# Patient Record
Sex: Male | Born: 1935 | Race: White | Hispanic: No | Marital: Married | Smoking: Never smoker
Health system: Southern US, Community
[De-identification: ages and names within clinical notes are randomized; demographics above are authoritative.]

## PROBLEM LIST (undated history)

## (undated) DIAGNOSIS — K219 Gastro-esophageal reflux disease without esophagitis: Secondary | ICD-10-CM

## (undated) DIAGNOSIS — M1990 Unspecified osteoarthritis, unspecified site: Secondary | ICD-10-CM

## (undated) DIAGNOSIS — L408 Other psoriasis: Secondary | ICD-10-CM

## (undated) DIAGNOSIS — D649 Anemia, unspecified: Secondary | ICD-10-CM

## (undated) DIAGNOSIS — M797 Fibromyalgia: Secondary | ICD-10-CM

## (undated) DIAGNOSIS — E785 Hyperlipidemia, unspecified: Secondary | ICD-10-CM

## (undated) DIAGNOSIS — C4490 Unspecified malignant neoplasm of skin, unspecified: Secondary | ICD-10-CM

## (undated) DIAGNOSIS — C8590 Non-Hodgkin lymphoma, unspecified, unspecified site: Secondary | ICD-10-CM

## (undated) DIAGNOSIS — I509 Heart failure, unspecified: Secondary | ICD-10-CM

## (undated) DIAGNOSIS — H9190 Unspecified hearing loss, unspecified ear: Secondary | ICD-10-CM

## (undated) DIAGNOSIS — I2510 Atherosclerotic heart disease of native coronary artery without angina pectoris: Secondary | ICD-10-CM

## (undated) DIAGNOSIS — I10 Essential (primary) hypertension: Secondary | ICD-10-CM

## (undated) DIAGNOSIS — Z87448 Personal history of other diseases of urinary system: Secondary | ICD-10-CM

## (undated) HISTORY — PX: APPENDECTOMY: SHX54

## (undated) HISTORY — PX: CYST EXCISION: SHX5701

## (undated) HISTORY — PX: EYE SURGERY: SHX253

## (undated) HISTORY — PX: POLYPECTOMY: SHX149

## (undated) HISTORY — PX: ELECTROLYSIS OF MISDIRECTED LASHES: SHX6483

## (undated) HISTORY — DX: Personal history of other diseases of urinary system: Z87.448

## (undated) HISTORY — DX: Other psoriasis: L40.8

## (undated) HISTORY — DX: Essential (primary) hypertension: I10

## (undated) HISTORY — PX: DIAGNOSTIC LAPAROSCOPY: SUR761

## (undated) HISTORY — PX: TEAR DUCT PROBING: SHX793

## (undated) HISTORY — PX: TYMPANOPLASTY: SHX33

## (undated) HISTORY — PX: VASECTOMY: SHX75

## (undated) HISTORY — DX: Unspecified malignant neoplasm of skin, unspecified: C44.90

## (undated) HISTORY — DX: Unspecified osteoarthritis, unspecified site: M19.90

## (undated) HISTORY — DX: Atherosclerotic heart disease of native coronary artery without angina pectoris: I25.10

## (undated) HISTORY — DX: Unspecified hearing loss, unspecified ear: H91.90

## (undated) HISTORY — PX: COLONOSCOPY: SHX174

## (undated) HISTORY — DX: Hyperlipidemia, unspecified: E78.5

---

## 2004-09-19 ENCOUNTER — Ambulatory Visit: Payer: Self-pay | Admitting: Internal Medicine

## 2004-09-27 ENCOUNTER — Ambulatory Visit: Payer: Self-pay | Admitting: Internal Medicine

## 2004-11-16 ENCOUNTER — Ambulatory Visit: Payer: Self-pay | Admitting: Internal Medicine

## 2005-01-26 HISTORY — PX: MASS EXCISION: SHX2000

## 2005-02-01 ENCOUNTER — Encounter: Admission: RE | Admit: 2005-02-01 | Discharge: 2005-02-01 | Payer: Self-pay | Admitting: General Surgery

## 2005-02-05 ENCOUNTER — Encounter (INDEPENDENT_AMBULATORY_CARE_PROVIDER_SITE_OTHER): Payer: Self-pay | Admitting: *Deleted

## 2005-02-05 ENCOUNTER — Ambulatory Visit (HOSPITAL_COMMUNITY): Admission: RE | Admit: 2005-02-05 | Discharge: 2005-02-05 | Payer: Self-pay | Admitting: General Surgery

## 2005-02-05 ENCOUNTER — Ambulatory Visit (HOSPITAL_BASED_OUTPATIENT_CLINIC_OR_DEPARTMENT_OTHER): Admission: RE | Admit: 2005-02-05 | Discharge: 2005-02-05 | Payer: Self-pay | Admitting: General Surgery

## 2005-03-15 ENCOUNTER — Ambulatory Visit: Payer: Self-pay | Admitting: Internal Medicine

## 2005-07-26 ENCOUNTER — Ambulatory Visit: Payer: Self-pay | Admitting: Internal Medicine

## 2005-08-13 ENCOUNTER — Ambulatory Visit: Payer: Self-pay | Admitting: Internal Medicine

## 2005-09-20 ENCOUNTER — Ambulatory Visit: Payer: Self-pay | Admitting: Internal Medicine

## 2005-10-01 ENCOUNTER — Ambulatory Visit: Payer: Self-pay | Admitting: Internal Medicine

## 2005-11-18 ENCOUNTER — Ambulatory Visit: Payer: Self-pay | Admitting: Internal Medicine

## 2006-02-07 ENCOUNTER — Ambulatory Visit: Payer: Self-pay | Admitting: Internal Medicine

## 2006-03-14 ENCOUNTER — Ambulatory Visit: Payer: Self-pay | Admitting: Internal Medicine

## 2006-04-12 ENCOUNTER — Ambulatory Visit: Payer: Self-pay | Admitting: Family Medicine

## 2006-08-08 ENCOUNTER — Ambulatory Visit: Payer: Self-pay | Admitting: Internal Medicine

## 2006-10-06 ENCOUNTER — Ambulatory Visit: Payer: Self-pay | Admitting: Internal Medicine

## 2006-10-30 ENCOUNTER — Ambulatory Visit: Payer: Self-pay | Admitting: Internal Medicine

## 2006-12-03 ENCOUNTER — Ambulatory Visit: Payer: Self-pay | Admitting: Internal Medicine

## 2006-12-03 LAB — CONVERTED CEMR LAB
AST: 29 units/L (ref 0–37)
Albumin: 3.5 g/dL (ref 3.5–5.2)
Total Bilirubin: 0.9 mg/dL (ref 0.3–1.2)

## 2006-12-08 ENCOUNTER — Ambulatory Visit: Payer: Self-pay | Admitting: Internal Medicine

## 2006-12-17 ENCOUNTER — Ambulatory Visit: Payer: Self-pay

## 2006-12-17 ENCOUNTER — Encounter: Payer: Self-pay | Admitting: Cardiology

## 2007-03-02 ENCOUNTER — Ambulatory Visit: Payer: Self-pay | Admitting: Gastroenterology

## 2007-03-10 ENCOUNTER — Encounter: Payer: Self-pay | Admitting: Gastroenterology

## 2007-03-10 ENCOUNTER — Ambulatory Visit: Payer: Self-pay | Admitting: Gastroenterology

## 2007-07-13 ENCOUNTER — Ambulatory Visit: Payer: Self-pay | Admitting: Internal Medicine

## 2007-08-01 ENCOUNTER — Ambulatory Visit: Payer: Self-pay | Admitting: Internal Medicine

## 2007-10-13 ENCOUNTER — Encounter: Payer: Self-pay | Admitting: *Deleted

## 2007-10-13 DIAGNOSIS — Z87448 Personal history of other diseases of urinary system: Secondary | ICD-10-CM

## 2007-10-13 DIAGNOSIS — C4490 Unspecified malignant neoplasm of skin, unspecified: Secondary | ICD-10-CM

## 2007-10-13 DIAGNOSIS — L408 Other psoriasis: Secondary | ICD-10-CM

## 2007-10-13 DIAGNOSIS — M19079 Primary osteoarthritis, unspecified ankle and foot: Secondary | ICD-10-CM | POA: Insufficient documentation

## 2007-10-13 DIAGNOSIS — I10 Essential (primary) hypertension: Secondary | ICD-10-CM

## 2007-10-13 DIAGNOSIS — Z9089 Acquired absence of other organs: Secondary | ICD-10-CM

## 2007-10-13 DIAGNOSIS — E785 Hyperlipidemia, unspecified: Secondary | ICD-10-CM

## 2007-10-13 DIAGNOSIS — Z8679 Personal history of other diseases of the circulatory system: Secondary | ICD-10-CM | POA: Insufficient documentation

## 2007-10-13 DIAGNOSIS — I493 Ventricular premature depolarization: Secondary | ICD-10-CM | POA: Insufficient documentation

## 2007-10-13 DIAGNOSIS — M109 Gout, unspecified: Secondary | ICD-10-CM

## 2007-10-13 HISTORY — DX: Other psoriasis: L40.8

## 2007-10-13 HISTORY — DX: Hyperlipidemia, unspecified: E78.5

## 2007-10-13 HISTORY — DX: Unspecified malignant neoplasm of skin, unspecified: C44.90

## 2007-10-13 HISTORY — DX: Personal history of other diseases of urinary system: Z87.448

## 2007-10-13 HISTORY — DX: Essential (primary) hypertension: I10

## 2007-10-14 ENCOUNTER — Ambulatory Visit: Payer: Self-pay | Admitting: Internal Medicine

## 2007-10-14 LAB — CONVERTED CEMR LAB
BUN: 16 mg/dL (ref 6–23)
Bilirubin, Direct: 0.2 mg/dL (ref 0.0–0.3)
Calcium: 9.1 mg/dL (ref 8.4–10.5)
Chloride: 105 meq/L (ref 96–112)
Cholesterol: 196 mg/dL (ref 0–200)
GFR calc non Af Amer: 63 mL/min
Glucose, Bld: 93 mg/dL (ref 70–99)
HDL: 29.7 mg/dL — ABNORMAL LOW (ref >39.0)
LDL Cholesterol: 135 mg/dL — ABNORMAL HIGH (ref 0–99)
Potassium: 4.2 meq/L (ref 3.5–5.1)
Sodium: 141 meq/L (ref 135–145)
Total Bilirubin: 0.9 mg/dL (ref 0.3–1.2)
Total Protein: 6.8 g/dL (ref 6.0–8.3)
Triglycerides: 157 mg/dL — ABNORMAL HIGH (ref 0–149)

## 2007-10-15 ENCOUNTER — Encounter: Payer: Self-pay | Admitting: Internal Medicine

## 2007-11-16 ENCOUNTER — Ambulatory Visit: Payer: Self-pay | Admitting: Internal Medicine

## 2007-11-16 LAB — CONVERTED CEMR LAB
BUN: 18 mg/dL
Basophils Absolute: 0 K/uL
Basophils Relative: 0.6 %
CO2: 28 meq/L
Calcium: 9.3 mg/dL
Chloride: 100 meq/L
Cholesterol: 213 mg/dL
Creatinine, Ser: 1.2 mg/dL
Direct LDL: 128 mg/dL
Eosinophils Absolute: 0.2 K/uL
Eosinophils Relative: 2.7 %
GFR calc Af Amer: 77 mL/min
GFR calc non Af Amer: 63 mL/min
Glucose, Bld: 95 mg/dL
HCT: 50.2 %
HDL: 28.8 mg/dL — ABNORMAL LOW
Hemoglobin: 17.7 g/dL — ABNORMAL HIGH
Lymphocytes Relative: 40.2 %
MCHC: 35.3 g/dL
MCV: 97.4 fL
Monocytes Absolute: 0.5 K/uL
Monocytes Relative: 7.7 %
Neutro Abs: 3.1 K/uL
Neutrophils Relative %: 48.8 %
Platelets: 208 K/uL
Potassium: 4.1 meq/L
RBC: 5.15 M/uL
RDW: 12.8 %
Sodium: 136 meq/L
Total CHOL/HDL Ratio: 7.4
Triglycerides: 161 mg/dL — ABNORMAL HIGH
VLDL: 32 mg/dL
WBC: 6.3 10*3/microliter

## 2007-11-17 ENCOUNTER — Ambulatory Visit: Payer: Self-pay | Admitting: Cardiology

## 2007-11-17 ENCOUNTER — Inpatient Hospital Stay (HOSPITAL_BASED_OUTPATIENT_CLINIC_OR_DEPARTMENT_OTHER): Admission: RE | Admit: 2007-11-17 | Discharge: 2007-11-17 | Payer: Self-pay | Admitting: Cardiology

## 2007-11-20 ENCOUNTER — Ambulatory Visit (HOSPITAL_COMMUNITY): Admission: RE | Admit: 2007-11-20 | Discharge: 2007-11-21 | Payer: Self-pay | Admitting: Cardiology

## 2007-11-20 ENCOUNTER — Ambulatory Visit: Payer: Self-pay | Admitting: Cardiology

## 2007-12-11 ENCOUNTER — Ambulatory Visit: Payer: Self-pay | Admitting: Internal Medicine

## 2007-12-11 LAB — CONVERTED CEMR LAB
AST: 19 units/L (ref 0–37)
GGT: 19 units/L (ref 7–51)

## 2007-12-24 ENCOUNTER — Encounter (HOSPITAL_COMMUNITY): Admission: RE | Admit: 2007-12-24 | Discharge: 2008-03-23 | Payer: Self-pay | Admitting: Internal Medicine

## 2008-02-19 ENCOUNTER — Ambulatory Visit: Payer: Self-pay | Admitting: Internal Medicine

## 2008-02-19 LAB — CONVERTED CEMR LAB
AST: 19 units/L (ref 0–37)
Cholesterol: 164 mg/dL (ref 0–200)
Triglycerides: 168 mg/dL — ABNORMAL HIGH (ref 0–149)

## 2008-03-10 ENCOUNTER — Ambulatory Visit: Payer: Self-pay | Admitting: Internal Medicine

## 2008-05-02 ENCOUNTER — Ambulatory Visit: Payer: Self-pay | Admitting: Internal Medicine

## 2008-05-02 LAB — CONVERTED CEMR LAB: ALT: 18 units/L (ref 0–53)

## 2008-05-05 ENCOUNTER — Encounter: Payer: Self-pay | Admitting: Internal Medicine

## 2008-05-05 ENCOUNTER — Ambulatory Visit: Payer: Self-pay

## 2008-06-30 ENCOUNTER — Ambulatory Visit: Payer: Self-pay | Admitting: Internal Medicine

## 2008-07-06 ENCOUNTER — Ambulatory Visit: Payer: Self-pay | Admitting: Internal Medicine

## 2008-07-06 LAB — CONVERTED CEMR LAB
Calcium: 9.4 mg/dL (ref 8.4–10.5)
GFR calc Af Amer: 84 mL/min
GFR calc non Af Amer: 70 mL/min
Glucose, Bld: 97 mg/dL (ref 70–99)
Sodium: 142 meq/L (ref 135–145)

## 2008-08-18 ENCOUNTER — Ambulatory Visit: Payer: Self-pay | Admitting: Internal Medicine

## 2008-10-18 ENCOUNTER — Ambulatory Visit: Payer: Self-pay | Admitting: Internal Medicine

## 2008-10-18 LAB — CONVERTED CEMR LAB
AST: 22 units/L (ref 0–37)
Albumin: 3.8 g/dL (ref 3.5–5.2)
BUN: 11 mg/dL (ref 6–23)
Creatinine, Ser: 0.9 mg/dL (ref 0.4–1.5)
GFR calc non Af Amer: 88 mL/min
Glucose, Bld: 99 mg/dL (ref 70–99)
LDL Cholesterol: 52 mg/dL (ref 0–99)
Total Bilirubin: 0.9 mg/dL (ref 0.3–1.2)
Uric Acid, Serum: 5.7 mg/dL (ref 4.0–7.8)
VLDL: 24 mg/dL (ref 0–40)

## 2009-01-16 DIAGNOSIS — I2510 Atherosclerotic heart disease of native coronary artery without angina pectoris: Secondary | ICD-10-CM | POA: Insufficient documentation

## 2009-05-05 ENCOUNTER — Telehealth: Payer: Self-pay | Admitting: Internal Medicine

## 2009-07-14 ENCOUNTER — Ambulatory Visit: Payer: Self-pay | Admitting: Internal Medicine

## 2009-07-18 ENCOUNTER — Ambulatory Visit: Payer: Self-pay | Admitting: Internal Medicine

## 2009-07-19 LAB — CONVERTED CEMR LAB
AST: 17 units/L (ref 0–37)
HDL: 26.8 mg/dL — ABNORMAL LOW (ref >39.00)
LDL Cholesterol: 49 mg/dL (ref 0–99)

## 2009-07-27 ENCOUNTER — Ambulatory Visit: Payer: Self-pay | Admitting: Internal Medicine

## 2009-09-04 ENCOUNTER — Telehealth (INDEPENDENT_AMBULATORY_CARE_PROVIDER_SITE_OTHER): Payer: Self-pay | Admitting: *Deleted

## 2009-09-07 ENCOUNTER — Telehealth (INDEPENDENT_AMBULATORY_CARE_PROVIDER_SITE_OTHER): Payer: Self-pay | Admitting: *Deleted

## 2009-09-12 ENCOUNTER — Encounter: Payer: Self-pay | Admitting: Internal Medicine

## 2009-10-19 ENCOUNTER — Ambulatory Visit: Payer: Self-pay | Admitting: Internal Medicine

## 2009-10-19 LAB — CONVERTED CEMR LAB
ALT: 22 units/L (ref 0–53)
AST: 23 units/L (ref 0–37)
Albumin: 4.1 g/dL (ref 3.5–5.2)
Basophils Relative: 0.9 % (ref 0.0–3.0)
Chloride: 101 meq/L (ref 96–112)
Cholesterol: 114 mg/dL (ref 0–200)
Creatinine, Ser: 0.9 mg/dL (ref 0.4–1.5)
GFR calc non Af Amer: 87.53 mL/min (ref >60)
Glucose, Bld: 92 mg/dL (ref 70–99)
HCT: 47.5 % (ref 39.0–52.0)
Hemoglobin: 16 g/dL (ref 13.0–17.0)
Monocytes Absolute: 0.6 10*3/uL (ref 0.1–1.0)
Monocytes Relative: 9.5 % (ref 3.0–12.0)
PSA: 0.33 ng/mL (ref 0.10–4.00)
Platelets: 196 10*3/uL (ref 150.0–400.0)
RBC: 4.7 M/uL (ref 4.22–5.81)
RDW: 12.8 % (ref 11.5–14.6)
Total Bilirubin: 1.1 mg/dL (ref 0.3–1.2)
Total CHOL/HDL Ratio: 3
Total Protein: 7.7 g/dL (ref 6.0–8.3)
Uric Acid, Serum: 6.6 mg/dL (ref 4.0–7.8)
WBC: 6.4 10*3/uL (ref 4.5–10.5)

## 2009-10-25 ENCOUNTER — Telehealth (INDEPENDENT_AMBULATORY_CARE_PROVIDER_SITE_OTHER): Payer: Self-pay | Admitting: *Deleted

## 2009-11-06 ENCOUNTER — Telehealth: Payer: Self-pay | Admitting: Internal Medicine

## 2009-11-06 DIAGNOSIS — R079 Chest pain, unspecified: Secondary | ICD-10-CM | POA: Insufficient documentation

## 2009-11-13 ENCOUNTER — Telehealth (INDEPENDENT_AMBULATORY_CARE_PROVIDER_SITE_OTHER): Payer: Self-pay | Admitting: *Deleted

## 2009-11-14 ENCOUNTER — Ambulatory Visit: Payer: Self-pay

## 2009-11-14 ENCOUNTER — Telehealth (INDEPENDENT_AMBULATORY_CARE_PROVIDER_SITE_OTHER): Payer: Self-pay | Admitting: *Deleted

## 2009-11-14 ENCOUNTER — Encounter (HOSPITAL_COMMUNITY): Admission: RE | Admit: 2009-11-14 | Discharge: 2010-02-02 | Payer: Self-pay | Admitting: Internal Medicine

## 2009-11-14 ENCOUNTER — Ambulatory Visit: Payer: Self-pay | Admitting: Cardiology

## 2009-11-14 ENCOUNTER — Encounter: Payer: Self-pay | Admitting: Internal Medicine

## 2009-12-04 ENCOUNTER — Ambulatory Visit: Payer: Self-pay | Admitting: Internal Medicine

## 2010-02-06 ENCOUNTER — Encounter (INDEPENDENT_AMBULATORY_CARE_PROVIDER_SITE_OTHER): Payer: Self-pay | Admitting: *Deleted

## 2010-03-21 ENCOUNTER — Telehealth (INDEPENDENT_AMBULATORY_CARE_PROVIDER_SITE_OTHER): Payer: Self-pay | Admitting: *Deleted

## 2010-04-02 ENCOUNTER — Ambulatory Visit: Payer: Self-pay | Admitting: Internal Medicine

## 2010-04-02 LAB — CONVERTED CEMR LAB
AST: 19 units/L (ref 0–37)
Total CHOL/HDL Ratio: 4

## 2010-07-19 ENCOUNTER — Ambulatory Visit: Payer: Self-pay | Admitting: Internal Medicine

## 2010-10-24 ENCOUNTER — Ambulatory Visit: Payer: Self-pay | Admitting: Internal Medicine

## 2010-10-24 ENCOUNTER — Encounter: Payer: Self-pay | Admitting: Internal Medicine

## 2010-10-24 LAB — CONVERTED CEMR LAB
ALT: 16 units/L (ref 0–53)
Albumin: 4 g/dL (ref 3.5–5.2)
Bilirubin, Direct: 0.1 mg/dL (ref 0.0–0.3)
Calcium: 9.3 mg/dL (ref 8.4–10.5)
Chloride: 104 meq/L (ref 96–112)
Cholesterol: 122 mg/dL (ref 0–200)
Creatinine, Ser: 1.1 mg/dL (ref 0.4–1.5)
Glucose, Bld: 87 mg/dL (ref 70–99)
HDL: 34.7 mg/dL — ABNORMAL LOW (ref >39.00)
Potassium: 3.9 meq/L (ref 3.5–5.1)
Sodium: 142 meq/L (ref 135–145)
Total Protein: 7.2 g/dL (ref 6.0–8.3)
Triglycerides: 98 mg/dL (ref 0.0–149.0)
VLDL: 19.6 mg/dL (ref 0.0–40.0)

## 2010-10-25 ENCOUNTER — Encounter: Payer: Self-pay | Admitting: Internal Medicine

## 2010-10-29 ENCOUNTER — Emergency Department (HOSPITAL_COMMUNITY)
Admission: EM | Admit: 2010-10-29 | Discharge: 2010-10-29 | Payer: Self-pay | Source: Home / Self Care | Admitting: Family Medicine

## 2010-11-01 ENCOUNTER — Telehealth: Payer: Self-pay | Admitting: Internal Medicine

## 2010-11-05 ENCOUNTER — Telehealth: Payer: Self-pay | Admitting: Internal Medicine

## 2010-11-05 DIAGNOSIS — H9190 Unspecified hearing loss, unspecified ear: Secondary | ICD-10-CM | POA: Insufficient documentation

## 2010-11-15 ENCOUNTER — Encounter: Payer: Self-pay | Admitting: Internal Medicine

## 2010-11-16 ENCOUNTER — Ambulatory Visit
Admission: RE | Admit: 2010-11-16 | Discharge: 2010-11-16 | Payer: Self-pay | Source: Home / Self Care | Attending: Internal Medicine | Admitting: Internal Medicine

## 2010-11-19 ENCOUNTER — Telehealth: Payer: Self-pay | Admitting: Internal Medicine

## 2010-11-20 ENCOUNTER — Telehealth: Payer: Self-pay | Admitting: Internal Medicine

## 2010-11-21 ENCOUNTER — Ambulatory Visit
Admission: RE | Admit: 2010-11-21 | Discharge: 2010-11-21 | Payer: Self-pay | Source: Home / Self Care | Attending: Internal Medicine | Admitting: Internal Medicine

## 2010-11-23 ENCOUNTER — Ambulatory Visit: Payer: Self-pay | Admitting: Internal Medicine

## 2010-11-26 ENCOUNTER — Encounter (INDEPENDENT_AMBULATORY_CARE_PROVIDER_SITE_OTHER): Payer: Self-pay | Admitting: *Deleted

## 2010-11-27 ENCOUNTER — Encounter: Payer: Self-pay | Admitting: Internal Medicine

## 2010-11-29 NOTE — Progress Notes (Signed)
Summary: LEFT ARM PAIN   Phone Note Call from Patient Call back at Home Phone [phone]   Caller: Patient Summary of Call: PT HAVE SOME PAIN LEFT ARM IS ACHING PT DON'T FEEL GOOD. Initial call taken by: Judie Grieve,  November 06, 2009 12:28 PM  Follow-up for Phone Call        Spoke with the pt. He is having pain above his heart on the right and left sides and under his heart. Pt is experiencing some SOB. The pt has had the symptoms for about 30 days. He states he saw Dr. Debby Bud last week and explained it to him- no recommendations per Dr. Debby Bud in reagards to this.  The pt states his symptoms are a little similar to his previous stent placement, but he is not as SOB as he was then. He carries NTG, but has not had to take any. The symptoms are not worse with activity, but the pt says he notices them more at night when he is sitting still. I will discuss this with Dr. Tenny Craw and call the pt back. The pt is agreeable. Follow-up by: Sherri Rad, RN, BSN,  November 06, 2009 12:32 PM  Additional Follow-up for Phone Call Additional follow up Details #1::        Dr. Tenny Craw called the pt and discussed his symptoms with him. Per Dr. Tenny Craw, she doesn't feel strongly that his symptoms are cardiac related, but we will set the pt up for an adenosine myoview to be done. I have the called the pt and explained this to him. We will have one of the schedulers call the pt and set him up for this for a day that is good for him. The pt verbalizes understanding.  Additional Follow-up by: Sherri Rad, RN, BSN,  November 06, 2009 1:00 PM  New Problems: CHEST PAIN UNSPECIFIED (ICD-786.50)   New Problems: CHEST PAIN UNSPECIFIED (ICD-786.50)

## 2010-11-29 NOTE — Progress Notes (Signed)
  Phone Note From Other Clinic   Summary of Call: call from Dr. Royden Purl at [facility]: patient with conductive hearing loss right ear: eustachian dysfunction vs other.  Needs OV Initial call taken by: Jacques Navy MD,  November 19, 2010 3:04 PM

## 2010-11-29 NOTE — Progress Notes (Signed)
Summary: questions about cold  medication  Phone Note Call from Patient Call back at Home Phone [phone]   Caller: Patient Complaint: Chest Pain Summary of Call: question about cold mediction being taken with b/p pills Initial call taken by: Judie Grieve,  November 01, 2010 2:38 PM  Follow-up for Phone Call        Phone Call Completed SPOKE WITH PT HAS A COUGH PICKED UP ROBITTUSIN DM  SPOKE WITH PHARMACISTS OKAY FOR PT TO TAKE MED PT AWARE./CY Follow-up by: Scherrie Bateman, LPN,  November 01, 2010 3:56 PM

## 2010-11-29 NOTE — Assessment & Plan Note (Signed)
Summary: flu shot/men/cd  Nurse Visit   Allergies: 1)  ! * Niaspan  Orders Added: 1)  Flu Vaccine 25yrs + MEDICARE PATIENTS [Q2039] 2)  Administration Flu vaccine - MCR [G0008] Flu Vaccine Consent Questions     Do you have a history of severe allergic reactions to this vaccine? no    Any prior history of allergic reactions to egg and/or gelatin? no    Do you have a sensitivity to the preservative Thimersol? no    Do you have a past history of Guillan-Barre Syndrome? no    Do you currently have an acute febrile illness? no    Have you ever had a severe reaction to latex? no    Vaccine information given and explained to patient? yes    Are you currently pregnant? no    Lot Number:[id number]   Exp Date:04/27/2011   Site Given  Right Deltoid [email or web address]

## 2010-11-29 NOTE — Progress Notes (Signed)
Summary: rx refill  Phone Note Refill Request Message from:  Patient on November 20, 2010 12:48 PM  Refills Requested: Medication #1:  ATENOLOL 25 MG TABS 1 tab every day  Method Requested: Fax to Local Pharmacy Initial call taken by: Roe Coombs,  November 20, 2010 12:48 PM Caller: Patient    Prescriptions: ATENOLOL 25 MG TABS (ATENOLOL) 1 tab every day  #30 x 6   Entered by:   Burnett Kanaris, CNA   Authorized by:   Sherrill Raring, MD, [facility]   Signed by:   Burnett Kanaris, CNA on 11/20/2010   Method used:   Electronically to        [facility]  [id number]* (retail)       [address]       [place], [place]  [address]       Ph: [phone] or [phone]       Fax: [phone]   RxID:   [id number]

## 2010-11-29 NOTE — Progress Notes (Signed)
Summary: Nuclear pre procedure  Phone Note Outgoing Call Call back at [facility] Phone [phone]   Call placed by: Rea College, CMA,  November 13, 2009 3:14 PM Call placed to: Patient Summary of Call: Reviewed information on Myoview Information Sheet (see scanned document for further details).  Spoke with patient.      Nuclear Med Background Indications for Stress Test: Evaluation for Ischemia, Stent Patency   History: Echo, Heart Catheterization, Myocardial Perfusion Study, Stents  History Comments: '08 Echo:EF=55%; 1/09 Stent-OM; 7/09 [id number]:[id number]  Symptoms: Chest Pain, Diaphoresis, DOE, Palpitations, SOB    Nuclear Pre-Procedure Cardiac Risk Factors: Family History - CAD, Hypertension, LBBB, Lipids Height (in): 71

## 2010-11-29 NOTE — Letter (Signed)
Summary: Appointment - Reminder 2  [facility], Main Office  [address]   [place], [place] [address]   Phone: [phone]  Fax: [phone]     February 06, 2010 MRN: [id number]   [facility] [address] [place], [place]  [address]   Dear Mr. Mkrtchyan,  Our records indicate that it is time to schedule a follow-up appointment with Dr. Tenny Craw. It is very important that we reach you to schedule this appointment. We look forward to participating in your health care needs. Please contact us at the number listed above at your earliest convenience to schedule your appointment.  If you are unable to make an appointment at this time, give [place] a call so we can update our records.     Sincerely,   [facility] Scheduling Team

## 2010-11-29 NOTE — Assessment & Plan Note (Signed)
Summary: yearly f/u medicare/cd   Vital Signs:  Patient profile:   77 year old male Height:      71 inches Weight:      202 pounds BMI:     28.28 O2 Sat:      97 % on Room air Temp:     97.9 degrees F oral Pulse rate:   68 / minute BP sitting:   128 / 78  (left arm) Cuff size:   regular  Vitals Entered By: Bill Salinas CMA (October 24, 2010 10:19 AM)  O2 Flow:  Room air  Primary Care Provider:  [facility]   History of Present Illness: Patient presents for a Medicare wellness exam. He reports that he feels good except he feels like there is fluid in the right middle ear. In the interval no serious medical illness, no injury or surgery.   100% independent in all ADLs. No falls and no fall risk or need for prophylactic counselling. No sings or symptoms of depression. Cognitively  intact: he handles household affairs,  appointments and family functions.  Preventive Screening-Counseling & Management  Alcohol-Tobacco     Alcohol drinks/day: 0     Smoking Status: never  Caffeine-Diet-Exercise     Caffeine use/day: 2 cups per day     Does Patient Exercise: yes     Type of exercise: walking     Exercise (avg: min/session): <30  Hep-HIV-STD-Contraception     Hepatitis Risk: no risk noted     HIV Risk: no risk noted     STD Risk: no risk noted     Dental Visit-last 6 months no     Sun Exposure-Excessive: no  Safety-Violence-Falls     Seat Belt Use: yes     Helmet Use: n/a     Violence in the Home: no risk noted     Sexual Abuse: no     Fall Risk: slight fall risk      Sexual History:  currently monogamous.        Drug Use:  never.        Blood Transfusions:  no.    Current Medications (verified): 1)  Triamterene-Hctz 37.5-25 Mg Tabs (Triamterene-Hctz) .... Take 1 Tablet By Mouth Once A Day 2)  Amlodipine Besylate 5 Mg Tabs (Amlodipine Besylate) .... Take One Tablet By Mouth Daily 3)  Plavix 75 Mg Tabs (Clopidogrel Bisulfate) .... Take 1 Tablet By Mouth Once A Day 4)   Nitroglycerin 0.4 Mg Subl (Nitroglycerin) .... One Tablet Under Tongue Every 5 Minutes As Needed For Chest Pain---May Repeat Times Three 5)  Crestor 10 Mg Tabs (Rosuvastatin Calcium) .[name].. 1 Tablet Every Day ( Prior To Bedtime) 6)  Aspirin 81 Mg Tbec (Aspirin) .... Take One Tablet By Mouth Daily  Allergies (verified): 1)  ! * Niaspan  Past History:  Past Surgical History: Last updated: 10/13/2007 FRACTURE, ARM, RIGHT REPAIR OF (ICD-818.0) VASECTOMY, HX OF (ICD-V26.52) * CYST EXCISION RIGHT SHOULDER. TONSILLECTOMY, HX OF (ICD-V45.79) APPENDECTOMY, HX OF (ICD-V45.79)  Past Medical History: CAD (ICD-414.00)-stent (promus) Cx/OM '09 SPECIAL SCREENING MALIGNANT NEOPLASM OF PROSTATE (ICD-V76.44) Hx of OSTEOARTHRITIS, ANKLE, RIGHT (ICD-715.97) Hx of SKIN CANCER, RECURRENT (ICD-173.9) PROSTATITIS, ACUTE, HX OF (ICD-V13.09) PSORIASIS (ICD-696.1) GOUT (ICD-274.9) Hx of PREMATURE VENTRICULAR CONTRACTIONS (ICD-427.69) PALPITATIONS, HX OF (ICD-V12.50) HYPERLIPIDEMIA (ICD-272.4) HYPERTENSION (ICD-401.9)    Family History: Reviewed history from 10/14/2007 and no changes required. father-died 90 MI maternal aunt - died 31 MI paternal uncle-died 24 MI mother-died 74; parkinson's sister - died breast cancer uncle -  died lung cancer  Social History: Caffeine use/day:  2 cups per day Dental Care w/in 6 mos.:  no Sun Exposure-Excessive:  no Seat Belt Use:  yes Fall Risk:  slight fall risk Blood Transfusions:  no Hepatitis Risk:  no risk noted HIV Risk:  no risk noted STD Risk:  no risk noted Sexual History:  currently monogamous Drug Use:  never  Review of Systems  The patient denies anorexia, fever, weight loss, weight gain, vision loss, decreased hearing, hoarseness, chest pain, dyspnea on exertion, peripheral edema, prolonged cough, headaches, abdominal pain, severe indigestion/heartburn, hematuria, muscle weakness, suspicious skin lesions, difficulty walking, depression, abnormal  bleeding, enlarged lymph nodes, breast masses, and testicular masses.    Physical Exam  General:  Well-developed,well-nourished,in no acute distress; alert,appropriate and cooperative throughout examination Head:  normocephalic, atraumatic, and no abnormalities observed.  Mild male pattern alopecia Eyes:  vision grossly intact, pupils equal, pupils round, corneas and lenses clear, no injection, and no optic disk abnormalities.   Ears:  External ear exam shows no significant lesions or deformities.  Otoscopic examination reveals clear canals, tympanic membranes are intact bilaterally without bulging, retraction, inflammation or discharge. Hearing is grossly normal bilaterally. Nose:  no external deformity and no external erythema.   Mouth:  partial denture upper and lower. No buccal lesions. Throat clear Neck:  supple, full ROM, no thyromegaly, and no carotid bruits.   Chest Wall:  No deformities, masses, tenderness or gynecomastia noted. Lungs:  Normal respiratory effort, chest expands symmetrically. Lungs are clear to auscultation, no crackles or wheezes. Heart:  Normal rate and regular rhythm. S1 and S2 normal without gallop, murmur, click, rub or other extra sounds. Abdomen:  soft, non-tender, normal bowel sounds, no guarding, no rigidity, and no hepatomegaly.   Rectal:  deferred Prostate:  deferred Msk:  normal ROM, no joint tenderness, no joint swelling, no joint warmth, no redness over joints, no joint deformities, and no joint instability.   Pulses:  2+ radial, 1+ DP Extremities:  No clubbing, cyanosis, edema, or deformity noted with normal full range of motion of all joints.   Neurologic:  alert & oriented X3, cranial nerves II-XII intact, strength normal in all extremities, gait normal, and DTRs symmetrical and normal.   Skin:  turgor normal and color normal.  mild erythematous rash on UE, c/w psoriasis. Whole body exam ot performed - deferred to radiology Cervical Nodes:  no anterior  cervical adenopathy and no posterior cervical adenopathy.   Inguinal Nodes:  no R inguinal adenopathy and no L inguinal adenopathy.   Psych:  Oriented X3, memory intact for recent and remote, normally interactive, and good eye contact.     Impression & Recommendations:  Problem # 1:  GOUT (ICD-274.9) stable with no recent episodes. Not on any prophylaxis.   Plan - Uric acid level with recommendations to follow.  Orders: TLB-Uric Acid, Blood (84550-URIC)  Addendum - uric acid is normal range. No prophylaxis indicated.  Problem # 2:  HYPERLIPIDEMIA (ICD-272.4) Due for follow-up lab with recommendations to follow.  His updated medication list for this problem includes:    Crestor 10 Mg Tabs (Rosuvastatin calcium) .[name]... 1 tablet every day ( prior to bedtime)  Orders: TLB-Lipid Panel (80061-LIPID) TLB-Hepatic/Liver Function Pnl (80076-HEPATIC)  addendum- excellent control on crestor 10. Continue the same  Problem # 3:  HYPERTENSION (ICD-401.9)  His updated medication list for this problem includes:    Triamterene-hctz 37.5-25 Mg Tabs (Triamterene-hctz) .[name]... Take 1 tablet by mouth once a day  Amlodipine Besylate 5 Mg Tabs (Amlodipine besylate) .[name]... Take one tablet by mouth daily  Orders: TLB-BMP (Basic Metabolic Panel-BMET) (80048-METABOL)  BP today: 128/78 Prior BP: 130/82 (04/02/2010)  Labs Reviewed: K+: 4.0 (10/19/2009) Creat: : 0.9 (10/19/2009)   Good control on present medications. Continue the same.  Problem # 4:  Preventive Health Care (ICD-V70.0)  Interval history is benign and he has been doing well. Physical exam is normal. Lab results are in normal limits. Current with colorectal cancer screeening with last study May '08. PSA normal with normal acceleration. Immunizations: tetnus due for booster today; due for pneumovax today; will need to return for shingles vaccine in 2 weeks; flu Sept '11. 12 LEAD EKG with RBBB without ischemia - unchanged from previous  EKG on file.  In summary - a nice man who is medically stable and risk factors are controlled. He is counseled to develop a regular exercise program - 3 30-minute sessions a week of aerobic exercise with a target heart rate of 120 sustained. He is counseled to see optometry or opthalmology every two years for glaucoma screening. He will return in 1 year or as needed.   Orders: Medicare -1st Annual Wellness Visit [phone])  Complete Medication List: 1)  Triamterene-hctz 37.5-25 Mg Tabs (Triamterene-hctz) .... Take 1 tablet by mouth once a day 2)  Amlodipine Besylate 5 Mg Tabs (Amlodipine besylate) .... Take one tablet by mouth daily 3)  Plavix 75 Mg Tabs (Clopidogrel bisulfate) .... Take 1 tablet by mouth once a day 4)  Nitroglycerin 0.4 Mg Subl (Nitroglycerin) .... One tablet under tongue every 5 minutes as needed for chest pain---may repeat times three 5)  Crestor 10 Mg Tabs (Rosuvastatin calcium) .[name].. 1 tablet every day ( prior to bedtime) 6)  Aspirin 81 Mg Tbec (Aspirin) .... Take one tablet by mouth daily  Other Orders: TLB-PSA (Prostate Specific Antigen) (84153-PSA)  Patient: [name] Note: All result statuses are Final unless otherwise noted.  Tests: (1) Lipid Panel (LIPID)   Cholesterol               122 mg/dL                   4-782     ATP III Classification            Desirable:  < 200 mg/dL                    Borderline High:  200 - 239 mg/dL               High:  > = 240 mg/dL   Triglycerides             98.0 mg/dL                  9.5-621.3     Normal:  <150 mg/dL     Borderline High:  086 - 199 mg/dL   HDL                  [L]  57.84 mg/dL                 >69.62   VLDL Cholesterol          19.6 mg/dL                  9.5-28.4   LDL Cholesterol           68 mg/dL  0-99  CHO/HDL Ratio:  CHD Risk                             4                    Men          Women     1/2 Average Risk     3.4          3.3     Average Risk          5.0           4.4     2X Average Risk          9.6          7.1     3X Average Risk          15.0          11.0                           Tests: (2) Hepatic/Liver Function Panel (HEPATIC)   Total Bilirubin           0.8 mg/dL                   1.6-1.0   Direct Bilirubin          0.1 mg/dL                   9.6-0.4   Alkaline Phosphatase      57 U/L                      39-117   AST                       18 U/L                      0-37   ALT                       16 U/L                      0-53   Total Protein             7.2 g/dL                    5.4-0.9   Albumin                   4.0 g/dL                    8.1-1.9  Tests: (3) BMP (METABOL)   Sodium                    142 mEq/L                   135-145   Potassium                 3.9 mEq/L                   3.5-5.1   Chloride                  104 mEq/L  96-112   Carbon Dioxide            32 mEq/L                    19-32   Glucose                   87 mg/dL                    16-10   BUN                       19 mg/dL                    9-60   Creatinine                1.1 mg/dL                   4.5-4.0   Calcium                   9.3 mg/dL                   9.8-11.9   GFR                       67.82 mL/min                >60.00  Tests: (4) Prostate Specific Antigen (PSA)   PSA-Hyb                   0.54 ng/mL                  0.10-4.00  Tests: (5) Uric Acid (URIC)   Uric Acid                 6.6 mg/dL                   1.4-7.8  Orders Added: 1)  Est. Patient Level IV [29562] 2)  Medicare -1st Annual Wellness Visit [G0438] 3)  TLB-Lipid Panel [80061-LIPID] 4)  TLB-Hepatic/Liver Function Pnl [80076-HEPATIC] 5)  TLB-BMP (Basic Metabolic Panel-BMET) [80048-METABOL] 6)  TLB-PSA (Prostate Specific Antigen) [84153-PSA] 7)  TLB-Uric Acid, Blood [84550-URIC]

## 2010-11-29 NOTE — Progress Notes (Signed)
  Walk in Patient Form Recieved " needs Script for Crestor, forward to Shelva Majestic [employer]  November 14, 2009 3:07 PM

## 2010-11-29 NOTE — Progress Notes (Signed)
Summary: Hearing Specialist Referral  Phone Note Call from Patient   Caller: Patient Call For: Jacques Navy MD Summary of Call: Pt is request'g hearing specialist referral ---Pt's ph#:  [phone] Initial call taken by: Ivar Bury,  November 05, 2010 11:07 AM  Follow-up for Phone Call        ok to refer to audioogy. [facility] notifed Follow-up by: Jacques Navy MD,  November 05, 2010 1:06 PM  Additional Follow-up for Phone Call Additional follow up Details #1::        no answer Additional Follow-up by: Lamar Sprinkles, CMA,  November 06, 2010 11:40 AM  New Problems: HEARING LOSS (ICD-389.9)   Additional Follow-up for Phone Call Additional follow up Details #2::    Referral in process Follow-up by: Lamar Sprinkles, CMA,  November 06, 2010 5:54 PM  New Problems: HEARING LOSS (ICD-389.9)

## 2010-11-29 NOTE — Assessment & Plan Note (Signed)
Summary: appt @ 10:15/rov/jml   Visit Type:  Follow-up Primary Provider:  [facility]  CC:  no complaints.  History of Present Illness: Patient is a 77 year old with a history of CAD, hypertension, dyslipidemia.  I saw him last in clinic in September 2010. Since seen, he had some atypical symptoms.  (short lived chest pain R and L chest, occasional arm pain.  NOt associated with acitvity).  A myoview was done which showed inferior/inferoseptal infarct with mild perinfarct ischemia.  There was no ischemia.   The scan was similar to scan in July 2009. When I spoke to him to tell him the results  he said he was actually feeling better since the test. He denies chest pain, no shortness of breath, no arm pain.  NOte he has stopped ASA and says he thinks this is why he is feeling better.  Current Medications (verified): 1)  Atenolol 25 Mg  Tabs (Atenolol) .... Take 1/2 Tablet Once Daily 2)  Triamterene-Hctz 37.5-25 Mg Tabs (Triamterene-Hctz) .... Take 1 Tablet By Mouth Once A Day 3)  Amlodipine Besylate 5 Mg Tabs (Amlodipine Besylate) .... Take One Tablet By Mouth Daily 4)  Plavix 75 Mg Tabs (Clopidogrel Bisulfate) .... Take 1 Tablet By Mouth Once A Day 5)  Nitroglycerin 0.4 Mg Subl (Nitroglycerin) .... One Tablet Under Tongue Every 5 Minutes As Needed For Chest Pain---May Repeat Times Three 6)  Crestor 10 Mg Tabs (Rosuvastatin Calcium) .[name].. 1 Tablet Every Day ( Prior To Bedtime)  Allergies (verified): 1)  ! * Niaspan  Past History:  Past Medical History: Last updated: 01/16/2009 CAD (ICD-414.00) SPECIAL SCREENING MALIGNANT NEOPLASM OF PROSTATE (ICD-V76.44) Hx of OSTEOARTHRITIS, ANKLE, RIGHT (ICD-715.97) Hx of SKIN CANCER, RECURRENT (ICD-173.9) MYOCARDIAL INFARCTION, ACUTE, FAMILY HX (ICD-V17.3) FRACTURE, ARM, RIGHT REPAIR OF (ICD-818.0) VASECTOMY, HX OF (ICD-V26.52) * CYST EXCISION RIGHT SHOULDER. TONSILLECTOMY, HX OF (ICD-V45.79) APPENDECTOMY, HX OF (ICD-V45.79) PROSTATITIS, ACUTE, HX  OF (ICD-V13.09) PSORIASIS (ICD-696.1) GOUT (ICD-274.9) Hx of PREMATURE VENTRICULAR CONTRACTIONS (ICD-427.69) PALPITATIONS, HX OF (ICD-V12.50) HYPERLIPIDEMIA (ICD-272.4) HYPERTENSION (ICD-401.9)    Vital Signs:  Patient profile:   77 year old male Height:      71 inches Weight:      206 pounds BMI:     28.84 Pulse rate:   55 / minute BP sitting:   122 / 72  (left arm) Cuff size:   regular  Vitals Entered By: Burnett Kanaris, CNA (December 04, 2009 10:15 AM)  Physical Exam  Additional Exam:  HEENT:  Normocephalic, atraumatic. EOMI, PERRLA.  Neck: JVP is normal. No thyromegaly. No bruits.  Lungs: clear to auscultation. No rales no wheezes.  Heart: Regular rate and rhythm. Normal S1, S2. No S3.   No significant murmurs. PMI not displaced.  Abdomen:  Supple, nontender. Normal bowel sounds. No masses. No hepatomegaly.  Extremities:   Good distal pulses throughout. No lower extremity edema.  Musculoskeletal :moving all extremities.  Neuro:   alert and oriented x3.    Impression & Recommendations:  Problem # 1:  CAD (ICD-414.00) I have reviewed the scans the patient has had over the past several years   The inferior/inferoseptal changes were there on previous scan, and slightly on older scans.  there is no singificant ischemia. ON reviewing my notes from the past  his symptoms have been vague, not typical, even before his intervention. What I have told the patient is to begin ecASA (he has stopped 325 ASA and says his breathing is better) If his symptoms recur he should  call.  Consider cath.  Problem # 2:  HYPERLIPIDEMIA (ICD-272.4) Continue meds. His updated medication list for this problem includes:    Crestor 10 Mg Tabs (Rosuvastatin calcium) .[name]... 1 tablet every day ( prior to bedtime)  Problem # 3:  PALPITATIONS, HX OF (ICD-V12.50) Denies.

## 2010-11-29 NOTE — Assessment & Plan Note (Signed)
Summary: hearling loss/#/cd   Vital Signs:  Patient profile:   77 year old male Height:      71 inches Weight:      198 pounds BMI:     27.72 O2 Sat:      97 % on Room air Temp:     97.8 degrees F oral Pulse rate:   53 / minute BP sitting:   124 / 72  (left arm) Cuff size:   regular  Vitals Entered By: Bill Salinas CMA (November 21, 2010 10:57 AM)  O2 Flow:  Room air  Primary Care Provider:  Norins   History of Present Illness: [place] with sudden on-set of hearing loss right ear. He was seen at [facility] and was found to have hearing loss right ear. Dr. Gerilyn Pilgrim  is  concerned about structual abnormality that could be the cause of his one-sided hearing loss. He admits to hearing loss. Denies any pain or discomfort. Has had no head trauma, ear infection or drainage from the ear. He has had no severe headaches and no neurologic symptoms.  Current Medications (verified): 1)  Triamterene-Hctz 37.5-25 Mg Tabs (Triamterene-Hctz) .... Take 1 Tablet By Mouth Once A Day 2)  Plavix 75 Mg Tabs (Clopidogrel Bisulfate) .... Take 1 Tablet By Mouth Once A Day 3)  Nitroglycerin 0.4 Mg Subl (Nitroglycerin) .... One Tablet Under Tongue Every 5 Minutes As Needed For Chest Pain---May Repeat Times Three 4)  Crestor 10 Mg Tabs (Rosuvastatin Calcium) .[name].. 1 Tablet Every Day ( Prior To Bedtime) 5)  Aspirin 81 Mg Tbec (Aspirin) .... Take One Tablet By Mouth Daily 6)  Atenolol 25 Mg Tabs (Atenolol) .[name].. 1 Tab Every Day 7)  Amlodipine Besylate 2.5 Mg Tabs (Amlodipine Besylate) .... 1/2  Tab Every Day  Allergies (verified): 1)  ! * Niaspan  Past History:  Past Medical History: Last updated: 10/24/2010 CAD (ICD-414.00)-stent (promus) Cx/OM '09 SPECIAL SCREENING MALIGNANT NEOPLASM OF PROSTATE (ICD-V76.44) Hx of OSTEOARTHRITIS, ANKLE, RIGHT (ICD-715.97) Hx of SKIN CANCER, RECURRENT (ICD-173.9) PROSTATITIS, ACUTE, HX OF (ICD-V13.09) PSORIASIS (ICD-696.1) GOUT (ICD-274.9) Hx of PREMATURE VENTRICULAR  CONTRACTIONS (ICD-427.69) PALPITATIONS, HX OF (ICD-V12.50) HYPERLIPIDEMIA (ICD-272.4) HYPERTENSION (ICD-401.9)    Past Surgical History: Last updated: 10/13/2007 FRACTURE, ARM, RIGHT REPAIR OF (ICD-818.0) VASECTOMY, HX OF (ICD-V26.52) * CYST EXCISION RIGHT SHOULDER. TONSILLECTOMY, HX OF (ICD-V45.79) APPENDECTOMY, HX OF (ICD-V45.79) FH reviewed for relevance, SH/Risk Factors reviewed for relevance  Review of Systems  The patient denies anorexia, fever, weight loss, weight gain, hoarseness, chest pain, prolonged cough, muscle weakness, difficulty walking, and enlarged lymph nodes.    Physical Exam  General:  Well-developed,well-nourished,in no acute distress; alert,appropriate and cooperative throughout examination Head:  Normocephalic and atraumatic without obvious abnormalities. No apparent alopecia or balding. Ears:  Right EAC clear; TM with normal landmars, no perforation.  Lungs:  normal respiratory effort and normal breath sounds.   Heart:  normal rate and regular rhythm.     Impression & Recommendations:  Problem # 1:  HEARING LOSS (ICD-389.9) Will obtain limited CT head to rule out structual or mass lesion cause for right-sided hearing loss.  Orders: Radiology Referral (Radiology)  Complete Medication List: 1)  Triamterene-hctz 37.5-25 Mg Tabs (Triamterene-hctz) .... Take 1 tablet by mouth once a day 2)  Plavix 75 Mg Tabs (Clopidogrel bisulfate) .... Take 1 tablet by mouth once a day 3)  Nitroglycerin 0.4 Mg Subl (Nitroglycerin) .... One tablet under tongue every 5 minutes as needed for chest pain---may repeat times three 4)  Crestor 10 Mg Tabs (  Rosuvastatin calcium) .[name].. 1 tablet every day ( prior to bedtime) 5)  Aspirin 81 Mg Tbec (Aspirin) .... Take one tablet by mouth daily 6)  Atenolol 25 Mg Tabs (Atenolol) .[name].. 1 tab every day 7)  Amlodipine Besylate 2.5 Mg Tabs (Amlodipine besylate) .... 1/2  tab every day   Orders Added: 1)  Radiology Referral  [Radiology] 2)  Est. Patient Level III [70623]

## 2010-11-29 NOTE — Miscellaneous (Signed)
  Clinical Lists Changes  Medications: Removed medication of CRESTOR 20 MG TABS (ROSUVASTATIN CALCIUM) Take one -half tablet by mouth daily. Added new medication of CRESTOR 10 MG TABS (ROSUVASTATIN CALCIUM) 1 tablet every day ( prior to bedtime) - Signed Rx of CRESTOR 10 MG TABS (ROSUVASTATIN CALCIUM) 1 tablet every day ( prior to bedtime);  #90 x 3;  Signed;  Entered by: Layne Benton, RN, BSN;  Authorized by: Sherrill Raring, MD, [facility];  Method used: Electronically to [facility]*, , ,   , Ph: [phone], Fax: [phone]    Prescriptions: CRESTOR 10 MG TABS (ROSUVASTATIN CALCIUM) 1 tablet every day ( prior to bedtime)  #90 x 3   Entered by:   Layne Benton, RN, BSN   Authorized by:   Sherrill Raring, MD, [facility]   Signed by:   Layne Benton, RN, BSN on 11/14/2009   Method used:   Electronically to        [facility]* (mail-order)             ,          Ph: [phone]       Fax: [phone]   RxID:   [id number]

## 2010-11-29 NOTE — Medication Information (Signed)
[facility]   Imported By: Kassie Mends 01/04/2010 13:17:31  _____________________________________________________________________  External Attachment:    Type:   Image     Comment:   External Document

## 2010-11-29 NOTE — Assessment & Plan Note (Signed)
Summary: PER CHECK OUT/SF   Visit Type:  Follow-up Primary Provider:  Norins  CC:  no complaints.  History of Present Illness: Patient is a 77 year old with a history of CAD, hypertension, dyslipidemia.  I saw him last in clinic in February of this year. When I saw him last he had some cmplatins of chest pains, abd pains.  Thiese have resolved.  Breathing is ok  Current Medications (verified): 1)  Triamterene-Hctz 37.5-25 Mg Tabs (Triamterene-Hctz) .... Take 1 Tablet By Mouth Once A Day 2)  Amlodipine Besylate 5 Mg Tabs (Amlodipine Besylate) .... Take One Tablet By Mouth Daily 3)  Plavix 75 Mg Tabs (Clopidogrel Bisulfate) .... Take 1 Tablet By Mouth Once A Day 4)  Nitroglycerin 0.4 Mg Subl (Nitroglycerin) .... One Tablet Under Tongue Every 5 Minutes As Needed For Chest Pain---May Repeat Times Three 5)  Crestor 10 Mg Tabs (Rosuvastatin Calcium) .[name].. 1 Tablet Every Day ( Prior To Bedtime) 6)  Aspirin 81 Mg Tbec (Aspirin) .... Take One Tablet By Mouth Daily  Allergies (verified): 1)  ! * Niaspan  Past History:  Past medical, surgical, family and social histories (including risk factors) reviewed, and no changes noted (except as noted below).  Past Medical History: Reviewed history from 01/16/2009 and no changes required. CAD (ICD-414.00) SPECIAL SCREENING MALIGNANT NEOPLASM OF PROSTATE (ICD-V76.44) Hx of OSTEOARTHRITIS, ANKLE, RIGHT (ICD-715.97) Hx of SKIN CANCER, RECURRENT (ICD-173.9) MYOCARDIAL INFARCTION, ACUTE, FAMILY HX (ICD-V17.3) FRACTURE, ARM, RIGHT REPAIR OF (ICD-818.0) VASECTOMY, HX OF (ICD-V26.52) * CYST EXCISION RIGHT SHOULDER. TONSILLECTOMY, HX OF (ICD-V45.79) APPENDECTOMY, HX OF (ICD-V45.79) PROSTATITIS, ACUTE, HX OF (ICD-V13.09) PSORIASIS (ICD-696.1) GOUT (ICD-274.9) Hx of PREMATURE VENTRICULAR CONTRACTIONS (ICD-427.69) PALPITATIONS, HX OF (ICD-V12.50) HYPERLIPIDEMIA (ICD-272.4) HYPERTENSION (ICD-401.9)    Past Surgical History: Reviewed history from  10/13/2007 and no changes required. FRACTURE, ARM, RIGHT REPAIR OF (ICD-818.0) VASECTOMY, HX OF (ICD-V26.52) * CYST EXCISION RIGHT SHOULDER. TONSILLECTOMY, HX OF (ICD-V45.79) APPENDECTOMY, HX OF (ICD-V45.79)  Family History: Reviewed history from 10/14/2007 and no changes required. father-died 90 MI maternal aunt - died 34 MI paternal uncle-died 45 MI mother-died 60; parkinson's sister - died breast cancer uncle - died lung cancer  Social History: Reviewed history from 10/18/2008 and no changes required. 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  Vital Signs:  Patient profile:   77 year old male Height:      71 inches Weight:      203 pounds BMI:     28.42 Pulse rate:   69 / minute BP sitting:   130 / 82  (left arm)  Vitals Entered By: Burnett Kanaris, CNA (April 02, 2010 8:21 AM)  Physical Exam  Additional Exam:  Patient is in NAD HEENT:  Normocephalic, atraumatic. EOMI, PERRLA.  Neck: JVP is normal. No thyromegaly. No bruits.  Lungs: clear to auscultation. No rales no wheezes.  Heart: Regular rate and rhythm. Normal S1, S2. No S3.   No significant murmurs. PMI not displaced.  Abdomen:  Supple, nontender. Normal bowel sounds. No masses. No hepatomegaly.  Extremities:   Good distal pulses throughout. No lower extremity edema.  Musculoskeletal :moving all extremities.  Neuro:   alert and oriented x3.    Impression & Recommendations:  Problem # 1:  CAD (ICD-414.00) No symptoms to suggest angina.  Keep on same regimen.  Will check on Plavix.  Problem #  2:  HYPERLIPIDEMIA (ICD-272.4) Continue.  Check labs. His updated medication list for this problem includes:    Crestor 10 Mg Tabs (Rosuvastatin calcium) .[name]... 1 tablet every day ( prior to bedtime)  Orders: TLB-AST (SGOT)  (84450-SGOT) TLB-Lipid Panel (80061-LIPID)  Problem # 3:  HYPERTENSION (ICD-401.9) COntinue meds.  Patient Instructions: 1)  Your physician recommends that you return for lab work in: lab work today lipid and ast..we will call you with results. 2)  F/U in clinic in the winter.

## 2010-11-29 NOTE — Assessment & Plan Note (Signed)
Summary: per check out/sf   Visit Type:  Follow-up   Problems Prior to Update: 1)  Hearing Loss  (ICD-389.9) 2)  Chest Pain Unspecified  (ICD-786.50) 3)  Cad  (ICD-414.00) 4)  Special Screening Malignant Neoplasm of Prostate  (ICD-V76.44) 5)  Hx of Osteoarthritis, Ankle, Right  (ICD-715.97) 6)  Hx of Skin Cancer, Recurrent  (ICD-173.9) 7)  Myocardial Infarction, Acute, Family Hx  (ICD-V17.3) 8)  Fracture, Arm, Right Repair of  (ICD-818.0) 9)  Vasectomy, Hx of  (ICD-V26.52) 10)  Cyst Excision Right Shoulder.  () 11)  Tonsillectomy, Hx of  (ICD-V45.79) 12)  Appendectomy, Hx of  (ICD-V45.79) 13)  Prostatitis, Acute, Hx of  (ICD-V13.09) 14)  Psoriasis  (ICD-696.1) 15)  Gout  (ICD-274.9) 16)  Hx of Premature Ventricular Contractions  (ICD-427.69) 17)  Palpitations, Hx of  (ICD-V12.50) 18)  Hyperlipidemia  (ICD-272.4) 19)  Hypertension  (ICD-401.9)  Current Medications (verified): 1)  Triamterene-Hctz 37.5-25 Mg Tabs (Triamterene-Hctz) .... Take 1 Tablet By Mouth Once A Day 2)  Amlodipine Besylate 5 Mg Tabs (Amlodipine Besylate) .... Take One Tablet By Mouth Daily 3)  Plavix 75 Mg Tabs (Clopidogrel Bisulfate) .... Take 1 Tablet By Mouth Once A Day 4)  Nitroglycerin 0.4 Mg Subl (Nitroglycerin) .... One Tablet Under Tongue Every 5 Minutes As Needed For Chest Pain---May Repeat Times Three 5)  Crestor 10 Mg Tabs (Rosuvastatin Calcium) .[name].. 1 Tablet Every Day ( Prior To Bedtime) 6)  Aspirin 81 Mg Tbec (Aspirin) .... Take One Tablet By Mouth Daily  Allergies (verified): 1)  ! * Niaspan  Past History:  Past Medical History: Last updated: 10/24/2010 CAD (ICD-414.00)-stent (promus) Cx/OM '09 SPECIAL SCREENING MALIGNANT NEOPLASM OF PROSTATE (ICD-V76.44) Hx of OSTEOARTHRITIS, ANKLE, RIGHT (ICD-715.97) Hx of SKIN CANCER, RECURRENT (ICD-173.9) PROSTATITIS, ACUTE, HX OF (ICD-V13.09) PSORIASIS (ICD-696.1) GOUT (ICD-274.9) Hx of PREMATURE VENTRICULAR CONTRACTIONS  (ICD-427.69) PALPITATIONS, HX OF (ICD-V12.50) HYPERLIPIDEMIA (ICD-272.4) HYPERTENSION (ICD-401.9)    Past Surgical History: Last updated: 10/13/2007 FRACTURE, ARM, RIGHT REPAIR OF (ICD-818.0) VASECTOMY, HX OF (ICD-V26.52) * CYST EXCISION RIGHT SHOULDER. 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  Risk Factors: Alcohol Use: 0 (10/24/2010) Caffeine Use: 2 cups per day (10/24/2010) Exercise: yes (10/24/2010)  Risk Factors: Smoking Status: never (10/24/2010)  Vital Signs:  Patient profile:   77 year old male Height:      71 inches Weight:      199.75 pounds BMI:     27.96 Pulse rate:   80 / minute BP sitting:   126 / 68  (left arm) Cuff size:   regular  Vitals Entered By: Micki Riley CNA (November 16, 2010 10:44 AM)   Patient Instructions: 1)  Your physician wants you to follow-up in: September 2012  You will receive a reminder letter in the mail two months in advance. If you don't receive a letter, please call our office to schedule the follow-up appointment. Prescriptions: AMLODIPINE BESYLATE 2.5 MG TABS (AMLODIPINE BESYLATE) 1 tab every day  #90 x 3   Entered by:   Layne Benton, RN, BSN   Authorized by:   Gunnar Fusi  Delon Sacramento, MD, [facility]   Signed by:   Layne Benton, RN, BSN on 11/16/2010   Method used:   Faxed to ...       [facility])             , [place]         Ph:        Fax: [phone]   RxID:   [id number]   Appended Document: per check out/sf Patient is  a 77 year old with a history of CAD, hypertension, dyslipidemia.  I saw him last in clinic in July 2011. Since seen he has done well.  He denies chest pain.  No SOB.  He does note palpitations.  Takes a metoprolol as needed. Exam:  Neck:  JVP is normal.  No bruits Lungs:CTA Cardiac:  RRR.  S1, S2.  No S3.  No murmurs Extremities:  No edema.  Impression:  CAD:  Doing well 2.  Palpitataitons:  Would back down on Norvasc to 2.5.  Add atenolol daily 3.  Dyslipidemia:  Continue meds.  F/U in fall.

## 2010-11-29 NOTE — Progress Notes (Signed)
  Walk in Patient Form Recieved " Plavix Prescription Refill" sent to [facility]  Mar 21, 2010 4:39 PM

## 2010-11-29 NOTE — Assessment & Plan Note (Signed)
Summary: Cardiology Nuclear Study  Nuclear Med Background Indications for Stress Test: Evaluation for Ischemia, Stent Patency   History: Echo, Heart Catheterization, Myocardial Perfusion Study, Stents  History Comments: '08 Echo:EF=55%; 1/09 Stent-OM; 7/09 MPS: EF 58% Inferoseptal scar with minor peri-infarct ischemia  Symptoms: Chest Pain, Diaphoresis, DOE, Palpitations, SOB    Nuclear Pre-Procedure Cardiac Risk Factors: Family History - CAD, Hypertension, LBBB, Lipids Caffeine/Decaff Intake: none NPO After: 7:00 PM Lungs: clear IV 0.9% NS with Angio Cath: 18g     IV Site: (R) AC IV Started by: Stanton Kidney EMT-P Chest Size (in) 44     Height (in): 71 Weight (lb): 205 BMI: 28.70  Nuclear Med Study 1 or 2 day study:  1 day     Stress Test Type:  Adenosine Reading MD:  Olga Millers, MD     Referring MD:  P.Ross Resting Radionuclide:  Technetium 48m Tetrofosmin     Resting Radionuclide Dose:  11.0 mCi  Stress Radionuclide:  Technetium 26m Tetrofosmin     Stress Radionuclide Dose:  33.0 mCi   Stress Protocol  Dose of Adenosine:  52.2 mg    Stress Test Technologist:  Milana Na EMT-P     Nuclear Technologist:  Harlow Asa CNMT  Rest Procedure  Myocardial perfusion imaging was performed at rest 45 minutes following the intravenous administration of Myoview Technetium 70m Tetrofosmin.  Stress Procedure  The patient received IV adenosine at 140 mcg/kg/min for 4 minutes. There were no significant changes with infusion. Myoview was injected at the 2 minute mark and quantitative spect images were obtained after a 45 minute delay.  QPS Raw Data Images:  Acuisition technically good; normal left ventricular size. Stress Images:  There is decreased uptake in the inferior wall and apex. Rest Images:  There is decreased uptake in the inferior wall and apex, less prominent compared to the stress images. Subtraction (SDS):  These findings are consistent with prior infarct and  mild peri-infarct ischemia. Transient Ischemic Dilatation:  1.01  (Normal <1.22)  Lung/Heart Ratio:  .43  (Normal <0.45)  Quantitative Gated Spect Images QGS EDV:  113 ml QGS ESV:  48 ml QGS EF:  57 % QGS cine images:  Normal wall motion.   Overall Impression  Exercise Capacity: Adenosine study with no exercise. BP Response: Normal blood pressure response. ECG Impression: No significant ST segment change suggestive of ischemia. Overall Impression: Abnormal adenosine nuclear study with prior inferior and apical infarct; mild peri-infarct ischemia towards the apex.  Appended Document: Cardiology Nuclear Study I spoke to patient.  He is feeling better since having stress test one wk ago.  No chest pain.  Breathing ok I have reviewed scan and compared to images in July 2009 scan.  The inferior, inferoapical defect is a little more prominent than previous with minor redistribution. SInce his symptoms were atypical and now better I would follow clinically.  Call if worsens.

## 2010-12-05 NOTE — Therapy (Signed)
Summary: [facility]   Imported By: Sherian Rein 11/21/2010 15:00:25  _____________________________________________________________________  External Attachment:    Type:   Image     Comment:   External Document

## 2010-12-13 NOTE — Consult Note (Signed)
Summary: Narda Bonds MD, ENT  Narda Bonds MD, ENT   Imported By: Sherian Rein 12/07/2010 07:33:43  _____________________________________________________________________  External Attachment:    Type:   Image     Comment:   External Document

## 2010-12-25 NOTE — Miscellaneous (Signed)
Summary: Orders Update   Clinical Lists Changes  Orders: Added new Referral order of ENT Referral (ENT) - Signed 

## 2011-01-02 ENCOUNTER — Telehealth: Payer: Self-pay | Admitting: Internal Medicine

## 2011-01-03 ENCOUNTER — Encounter (INDEPENDENT_AMBULATORY_CARE_PROVIDER_SITE_OTHER): Payer: Medicare Other | Admitting: Internal Medicine

## 2011-01-03 ENCOUNTER — Encounter: Payer: Self-pay | Admitting: Internal Medicine

## 2011-01-03 DIAGNOSIS — R0789 Other chest pain: Secondary | ICD-10-CM

## 2011-01-03 DIAGNOSIS — I10 Essential (primary) hypertension: Secondary | ICD-10-CM

## 2011-01-08 NOTE — Progress Notes (Signed)
Summary: pains above left breast and some shoulder pain  Phone Note Call from Patient Call back at Home Phone [phone]   Caller: Patient Summary of Call: No chest pains just pains above breast on left side and shoulder Initial call taken by: Judie Grieve,  January 02, 2011 3:58 PM  Follow-up for Phone Call        Called patient back and he advised me that he has had chest pain on and off for the past few days sometimes relieved with nitro. He is pain free currently. Advised him to see Dr.Taylor (DOD) tomorrow at 1230 pm.  Layne Benton, RN, BSN  January 02, 2011 5:57 PM

## 2011-01-08 NOTE — Assessment & Plan Note (Signed)
Summary: chest pain/per dr ross/per jackie/jml   Visit Type:  Follow-up Primary Provider:  Norins   History of Present Illness: Patient is a 77 year old with a history of CAD, hypertension, dyslipidemia.  He underwent stenting in 2009. He has very atypical symptoms but notes that over the past few days, he has had more shoulder pain which radiates to the chest, is non-exertional and improved after taking medicine for reflux. He continues to walk regularly and does not have symptoms and no worsening sob. No other complaints today.    Current Medications (verified): 1)  Triamterene-Hctz 37.5-25 Mg Tabs (Triamterene-Hctz) .... Take 1 Tablet By Mouth Once A Day 2)  Plavix 75 Mg Tabs (Clopidogrel Bisulfate) .... Take 1 Tablet By Mouth Once A Day 3)  Nitroglycerin 0.4 Mg Subl (Nitroglycerin) .... One Tablet Under Tongue Every 5 Minutes As Needed For Chest Pain---May Repeat Times Three 4)  Crestor 10 Mg Tabs (Rosuvastatin Calcium) .[name].. 1 Tablet Every Day ( Prior To Bedtime) 5)  Aspirin 81 Mg Tbec (Aspirin) .... Take One Tablet By Mouth Daily 6)  Atenolol 25 Mg Tabs (Atenolol) .[name].. 1 Tab Every Day 7)  Amlodipine Besylate 2.5 Mg Tabs (Amlodipine Besylate) .... 1/2  Tab Every Day 8)  Acidophilus Probiotic Blend  Caps (Probiotic Product) .... Once Daily  Allergies: 1)  ! * Niaspan  Past History:  Past Medical History: Last updated: 10/24/2010 CAD (ICD-414.00)-stent (promus) Cx/OM '09 SPECIAL SCREENING MALIGNANT NEOPLASM OF PROSTATE (ICD-V76.44) Hx of OSTEOARTHRITIS, ANKLE, RIGHT (ICD-715.97) Hx of SKIN CANCER, RECURRENT (ICD-173.9) PROSTATITIS, ACUTE, HX OF (ICD-V13.09) PSORIASIS (ICD-696.1) GOUT (ICD-274.9) Hx of PREMATURE VENTRICULAR CONTRACTIONS (ICD-427.69) PALPITATIONS, HX OF (ICD-V12.50) HYPERLIPIDEMIA (ICD-272.4) HYPERTENSION (ICD-401.9)    Past Surgical History: Last updated: 10/13/2007 FRACTURE, ARM, RIGHT REPAIR OF (ICD-818.0) VASECTOMY, HX OF (ICD-V26.52) * CYST EXCISION  RIGHT SHOULDER. TONSILLECTOMY, HX OF (ICD-V45.79) APPENDECTOMY, HX OF (ICD-V45.79)  Review of Systems  The patient denies syncope, dyspnea on exertion, and peripheral edema.    Vital Signs:  Patient profile:   77 year old male Height:      71 inches Weight:      201 pounds BMI:     28.14 BP sitting:   130 / 70  (left arm)  Vitals Entered By: Laurance Flatten CMA (January 03, 2011 1:12 PM)  Physical Exam  General:  Well-developed,well-nourished,in no acute distress; alert,appropriate and cooperative throughout examination Head:  Normocephalic and atraumatic without obvious abnormalities. No apparent alopecia or balding. Eyes:  vision grossly intact, pupils equal, pupils round, corneas and lenses clear, no injection, and no optic disk abnormalities.   Mouth:  partial denture upper and lower. No buccal lesions. Throat clear Neck:  supple, full ROM, no thyromegaly, and no carotid bruits.   Chest Wall:  No deformities, masses, tenderness or gynecomastia noted. Lungs:  normal respiratory effort and normal breath sounds.   Heart:  normal rate and regular rhythm.   Abdomen:  soft, non-tender, normal bowel sounds, no guarding, no rigidity, and no hepatomegaly.   Msk:  normal ROM, no joint tenderness, no joint swelling, no joint warmth, no redness over joints, no joint deformities, and no joint instability.   Pulses:  2+ radial, 1+ DP Extremities:  No clubbing, cyanosis, edema, or deformity noted with normal full range of motion of all joints.   Neurologic:  alert & oriented X3, cranial nerves II-XII intact, strength normal in all extremities, gait normal, and DTRs symmetrical and normal.     Impression & Recommendations:  Problem #  1:  CHEST PAIN UNSPECIFIED (ICD-786.50) His symptoms are non-cardiac. I have recommended a period of watchful waiting and asked him to followup with Dr. Tenny Craw. He will call us sooner if he develops any exertional symptoms. His updated medication list for this problem  includes:    Plavix 75 Mg Tabs (Clopidogrel bisulfate) .[name]... Take 1 tablet by mouth once a day    Nitroglycerin 0.4 Mg Subl (Nitroglycerin) ..... One tablet under tongue every 5 minutes as needed for chest pain---may repeat times three    Aspirin 81 Mg Tbec (Aspirin) .[name]... Take one tablet by mouth daily    Atenolol 25 Mg Tabs (Atenolol) .[name]... 1 tab every day    Amlodipine Besylate 2.5 Mg Tabs (Amlodipine besylate) .[name]... 1/2  tab every day  Problem # 2:  PALPITATIONS, HX OF (ICD-V12.50) These appear to be well controlled. Will follow.  Patient Instructions: 1)  Your physician recommends that you schedule a follow-up appointment with Dr. Tenny Craw in April, 2012. 2)  Your physician recommends that you continue on your current medications as directed. Please refer to the Current Medication list given to you today.

## 2011-02-08 ENCOUNTER — Encounter: Payer: Self-pay | Admitting: Internal Medicine

## 2011-02-11 ENCOUNTER — Encounter: Payer: Self-pay | Admitting: Internal Medicine

## 2011-02-11 ENCOUNTER — Ambulatory Visit (INDEPENDENT_AMBULATORY_CARE_PROVIDER_SITE_OTHER): Payer: Medicare Other | Admitting: Internal Medicine

## 2011-02-11 DIAGNOSIS — E785 Hyperlipidemia, unspecified: Secondary | ICD-10-CM

## 2011-02-11 DIAGNOSIS — I2510 Atherosclerotic heart disease of native coronary artery without angina pectoris: Secondary | ICD-10-CM

## 2011-02-11 NOTE — Patient Instructions (Signed)
Your physician recommends that you schedule a follow-up appointment in: early October or early November 2012.

## 2011-02-12 NOTE — Progress Notes (Signed)
HPI Patient is a 77 year old with a history of CAD, hypertension, dyslipidemia.  I saw him back in January of this year. He made today's appt when he was not feeling good.  He had been doing well until he was started on a steroid nasal spray by ENT.  A few days after starting he developed chest pains.  He stopped the medicines and the symptoms resolved.  ENT placed tubes in his ears.  He has been seen back in clinic and no further changes in Rx were made.  He is no longer taking the steroid. He now feels pretty good.  No chest pain.  No SOB. Allergies  Allergen Reactions  . Niacin     Current Outpatient Prescriptions  Medication Sig Dispense Refill  . amLODipine (NORVASC) 2.5 MG tablet Take 2.5 mg by mouth. Take 1/2 tablet daily       . aspirin 81 MG tablet Take 81 mg by mouth daily.        [name] atenolol (TENORMIN) 25 MG tablet Take 25 mg by mouth daily.        . clopidogrel (PLAVIX) 75 MG tablet Take 75 mg by mouth daily.        . nitroGLYCERIN (NITROSTAT) 0.4 MG SL tablet Place 0.4 mg under the tongue every 5 (five) minutes as needed.        . Probiotic Product (ACIDOPHILUS PROBIOTIC BLEND) CAPS Take 1 capsule by mouth daily.        . rosuvastatin (CRESTOR) 10 MG tablet Take 10 mg by mouth daily.        [name] triamterene-hydrochlorothiazide (MAXZIDE-25) 37.5-25 MG per tablet Take 1 tablet by mouth daily.          Past Medical History  Diagnosis Date  . Coronary artery disease     stent (promus) Cx/OM'09  . Osteoarthritis     right ankle  . Recurrent skin cancer   . Prostatitis, acute   . Psoriasis   . Gout   . PVC (premature ventricular contraction)     hx  . Palpitations   . Hyperlipidemia   . Hypertension     Past Surgical History  Procedure Date  . Vasectomy   . Tonsillectomy   . Appendectomy     Family History  Problem Relation Age of Onset  . Heart attack Father 84    died  . Heart attack  50    maternal aunt died  . Heart attack  72    paternal uncle died  .  Parkinsonism Mother 89    died  . Breast cancer Sister     died  . Lung cancer      uncle died    History   Social History  . Marital Status: Married    Spouse Name: N/A    Number of Children: 3  . Years of Education: N/A   Occupational History  . retired     from [employer].   Social History Main Topics  . Smoking status: Never Smoker   . Smokeless tobacco: Not on file  . Alcohol Use: Not on file  . Drug Use: Not on file  . Sexually Active: Not on file   Other Topics Concern  . Not on file   Social History Narrative  . No narrative on file    Review of Systems:  All systems reviewed.  They are negative to the above problem except as previously stated.  Vital Signs: BP 128/80  Pulse 52  Resp 18  Ht 6' (1.829 m)  Wt 203 lb 1.9 oz (92.135 kg)  BMI 27.55 kg/m2  Physical Exam  HEENT:  Normocephalic, atraumatic. EOMI, PERRLA.  Neck: JVP is normal. No thyromegaly. No bruits.  Lungs: clear to auscultation. No rales no wheezes.  Heart: Regular rate and rhythm. Normal S1, S2. No S3.   No significant murmurs. PMI not displaced.  Abdomen:  Supple, nontender. Normal bowel sounds. No masses. No hepatomegaly.  Extremities:   Good distal pulses throughout. No lower extremity edema.  Musculoskeletal :moving all extremities.  Neuro:   alert and oriented x3.  CN II-XII grossly intact.  EKG:  Sinus bradycardia.  RBBB.  LAFB.     Assessment and Plan:

## 2011-02-12 NOTE — Assessment & Plan Note (Signed)
LDL with good control.  Keep on same regimen.

## 2011-02-12 NOTE — Assessment & Plan Note (Signed)
No symptoms to sugg angina.  Keep on same regimen.

## 2011-02-22 ENCOUNTER — Other Ambulatory Visit: Payer: Self-pay | Admitting: Internal Medicine

## 2011-03-12 NOTE — Assessment & Plan Note (Signed)
[facility]                            CARDIOLOGY OFFICE NOTE   NAME:[name], Jacob HUSAK                   MRN:          [id number]  DATE:07/13/2007                            DOB:          11-04-1932    IDENTIFICATION:  [name] is a 77 year old gentleman with history of  hypertension, dyslipidemia, PVCs.  I last saw him in February.   Since I saw him last, he had some indigestion.  He was actually  scheduled for a Myoview.  This was done also in February.  It showed no  ischemia, EF was 58%.  The patient went on to have a GI evaluation that  was really benign.  I think 1 polyp was seen.   The patient has done fairly well.  He is now most retired.  He does an  occasional paint job.  He says that he will get up in the morning and he  just does not feel quite as good, but once he has had his breakfast, his  biscuit and grits, he will be fine for the rest of the day.  He denies  chest pressure, no shortness of breath.  He denies palpitations, no  dizziness.   CURRENT MEDICATIONS:  1. Atenolol 25.  2. Aspirin p.r.n. he takes about 4 times per week.  3. Lisinopril 10.  4. Hydrochlorothiazide - question dose.   PHYSICAL EXAMINATION:  GENERAL:  The patient is in no distress.  VITAL SIGNS:  Blood pressure 116/81, pulse 70 and regular, weight 218  which is down from 220 in February.  LUNGS:  Clear.  CARDIAC:  Regular rate and rhythm.  S1 and S2.  No S3, no murmurs.  ABDOMEN:  Benign.  EXTREMITIES:  No edema.   IMPRESSION:  1. Hypertension, good control.  2. Premature ventricular contractions, asymptomatic.  3. Dyslipidemia.  He had an instant cholesterol check done.  His HDL      was actually in the 20s.  I told him we need to get a good fasting      panel, and I will be back in touch with him.  Continue as he is      now.   Otherwise, I will set to see him back in about 8-9 months, sooner if  problems develop.     Pricilla Riffle, MD,  [facility]  Electronically Signed    PVR/MedQ  DD: 07/13/2007  DT: 07/14/2007  Job #: [id number]

## 2011-03-12 NOTE — Discharge Summary (Signed)
[name], [name] NO.:  [id number]   MEDICAL RECORD NO.:  [id number]          PATIENT TYPE:  OIB   LOCATION:  1962                         FACILITY:  [facility]   PHYSICIAN:  Doylene Canning. Ladona Ridgel, MD    DATE OF BIRTH:  [birth year]   DATE OF ADMISSION:  11/17/2007  DATE OF DISCHARGE:  11/21/2007                               DISCHARGE SUMMARY   PRIMARY CARDIOLOGIST:  Dr. Dietrich Pates.   PRIMARY CARE PHYSICIAN:  Not listed.   PROCEDURES PERFORMED DURING HOSPITALIZATION:  1. Left heart cath on November 17, 2007 per Dr. Charlies Constable, revealing      LAD 50%, circumflex 30% proximal, 90% OM, RCA 40% mid, LV function      had with left bundle branch block.  2. Status post PCI to the circumflex using a Promus 2.5-mm x 12-mm      stent, per Dr. Juanda Chance on November 20, 2007, reducing circumflex      stenosis from 90% to 0%.   FINAL DIAGNOSES:  1. Coronary artery disease.      a.     Status post cardiac catheterization per Dr. Juanda Chance on       November 19, 2007.      b.     Status post Promus stent to circumflex on November 20, 2007.       Please see Dr. Regino Schultze thorough catheterization note and PCI for       transcriptions for details.  2. Hypertension.  3. Dyslipidemia.  4. History of palpitations with intermittent PVCs.  5. History of gout.  6. History of psoriasis.   HOSPITAL COURSE:  [name] is a 77 year old male patient of Dr. Dietrich Pates, who was admitted secondary to recurrent shortness of breath,  awaking him at night, along with palpitations.  The patient also has  some intermittent chest pressure not associated with activity.  The  patient also noted some left arm pain that he attributed to more  musculoskeletal when he driving.  Secondary to the patient's recurrent  symptoms and cardiovascular risk factors, it was planned by Dr. Tenny Craw to  have the patient admitted for an outpatient cardiac catheterization.   Cardiac catheterization was completed by Dr. Juanda Chance on  November 17, 2007,  with results discussed above.  The patient subsequently had PCI to the  circumflex artery, reducing the stenosis from 90% to 0%, with a Promus  stent placed.  The patient tolerated the procedure well and had no  recurrence of symptoms.  The patient had no evidence of bleeding  hematoma or infection.  Post procedure, the patient up walking around  with cardiac rehab and did well.  The patient was seen and examined by  Dr. Lewayne Bunting on discharge and found to be stable with followup  appointment with Dr. Tenny Craw on discharge.   DISCHARGE LABS:  Sodium 139, potassium 3.9, chloride 106, CO2 27, BUN  17, creatinine 1.03, glucose 118, hemoglobin 14.7, hematocrit 42.4,  white blood cells 6.8, platelets 190, blood pressure 112/64, pulse 60,  respirations 18.   DISCHARGE MEDICATIONS:  1. Aspirin 325 daily.  2. Plavix  75 mg daily.  3. Atenolol 25 mg daily.  4. Lisinopril 10 mg daily.  5. HCTZ 3.75 mg daily.  6. Nitroglycerin 0.4 mg sublingual p.r.n.   ALLERGIES:  No known drug allergies.   FOLLOWUP PLANS AND APPOINTMENTS:  1. The patient will follow up with Dr. Dietrich Pates in the office, in      approximately 2 weeks for followup office visit.  Our office will      call, as this is the weekend.  2. The patient has been given post-cardiac catheterization      instructions with particular emphasis on the right groin site for      bleeding, hematoma, signs of infection.  3. The patient has been prescription for Plavix and nitroglycerin to      be used on discharge.  4. The patient is to follow up with the primary care physician for      continued medical management.   Time spent with the patient to include physician time 35 minutes.      Bettey Mare. Lyman Bishop, NP      Doylene Canning. Ladona Ridgel, MD  Electronically Signed    KML/MEDQ  D:  11/21/2007  T:  11/21/2007  Job:  [id number]

## 2011-03-12 NOTE — Assessment & Plan Note (Signed)
[facility]                            CARDIOLOGY OFFICE NOTE   NAME:Corbello, RYLYN RANGANATHAN                   MRN:          [id number]  DATE:11/16/2007                            DOB:          04-09-1933    PATIENT IDENTIFICATION:  [name] is a patient who I followed in the  cardiology clinic. He is a 77 year old gentleman last seen in clinic  back in September. He actually made the appointment today.   Last week he woke up a few nights with shortness of breath. He said he  felt like when he had his palpitations in the past but he denied any  palpitations at the time. He got up moved around some and the episode  cleared. He denied associated chest pain. He does not occasional chest  pressure not associated with any particular activity though. It comes  intermittently, does not appear to be worse recently. He notes also some  left arm pain but he attributes to more muscular when he is driving.   He takes activities as tolerated.  No change in this. He is retired. He  has acid reflux but denies any burning sensation in his mouth or  brackishness when he had these spells last week.   CURRENT MEDICATIONS:  1. Atenolol 25 daily.  2. Aspirin 325 daily.  3. Lisinopril 10.  4. Hydrochlorothiazide 37.5.   PAST MEDICAL HISTORY:  1. Hypertension.  2. Dyslipidemia.  3. History of palpitations. Intermittent PVCs.  4. History of gout.  5. History of psoriasis.   ALLERGIES:  No known drug allergies.   SOCIAL HISTORY:  The patient is retired, he is married, does not smoke.   FAMILY HISTORY:  Father with coronary artery disease.   REVIEW OF SYSTEMS:  All systems reviewed. No cough, no fever, otherwise  negative to the above problem except as noted above.   PHYSICAL EXAMINATION:  Currently the patient is in no acute distress.  The blood pressure is 123/78, pulse is 62 and regular, weight 271.  HEENT:  Normocephalic, atraumatic. EOMI. PERRL. Mucous  membranes are  moist.  NECK:  JVP is normal. No thyromegaly, no bruits.  LUNGS:  Clear to auscultation without rales or wheezes.  CARDIAC:  Regular rate and rhythm, S1, S2, no S3. No significant  murmurs.  ABDOMEN:  Supple, nontender, no masses, no hepatomegaly.  EXTREMITIES:  Good distal pulses throughout, no lower extremity edema.   A 12-lead EKG normal sinus bradycardia, 55 beats per minute. PR interval  is 168 msec, left bundle branch block unchanged.   IMPRESSION:  [name] is a 77 year old gentleman who has no known  history of coronary artery disease with spells that are worrisome though  for PND. He does have reflux but he denies any brackish taste at the  time these spells occurred. I think ruling out any coronary artery  disease is advisable with cardiac catheterization and I have discussed  this with him. Note he had a Myoview scan done in February 2008 that  showed no evidence for ischemia and LVF of 58%, some mild thinning noted  in the inferior wall.  1. We will plan on a cardiac catheterization tomorrow and a JV lamp      with labs today.  2. Hypertension. Continue beta blocker and      lisinopril/hydrochlorothiazide.  3. Dyslipidemia. Will need to get a good fasting. He actually has not      had this checked in a while. Had been on Crestor in the past.   I will see him back after testing.     Pricilla Riffle, MD, [facility]  Electronically Signed    PVR/MedQ  DD: 11/16/2007  DT: 11/16/2007  Job #: [id number]

## 2011-03-12 NOTE — Cardiovascular Report (Signed)
NAME:  ANNE, SEBRING            ACCOUNT NO.:  [id number]   MEDICAL RECORD NO.:  [id number]          PATIENT TYPE:  OIB   LOCATION:  6531                         FACILITY:  [facility]   PHYSICIAN:  Bruce R. Juanda Chance, MD, FACCDATE OF BIRTH:  03-May-1933   DATE OF PROCEDURE:  DATE OF DISCHARGE:                            CARDIAC CATHETERIZATION   CLINICAL HISTORY:  Mr. Schue is 77 years old and recently had  developed symptoms of shortness of breath.  He was seen in consultation  by Dr. Dietrich Pates who arranged evaluation in the JV lab with  angiography.  This showed a 90% stenosis in the marginal branch of the  circumflex artery.  He was brought in today for intervention.   PROCEDURE:  The procedure was performed via the right femoral arterial  sheath and 6-French preformed coronary catheters.  A front wall arterial  puncture was performed and Omnipaque contrast was used.  We used a 6-  [place] CLS4 guiding catheter with side holes.  The patient was given  Angiomax bolus and infusion.  He was given an additional 300 mg of  Plavix and had been on Plavix since last Tuesday.  He also was given 4  chewable baby aspirin.  We passed a Prowater wire down the circumflex  artery across the lesion in the marginal branch.  We predilated with a  2.5 x 12 mm Maverick balloon performing 1 inflation up to 8 atmospheres  for 20 seconds.  Following balloon inflation, the patient developed  bradycardia and hypotension with nausea and diaphoresis.  This was  treated with IV fluids, dopamine and atropine with return of his blood  pressure.  We then deployed a 2.5 x 12 mm Promus stent deploying this  with 1 inflation of 14 atmospheres for 20 seconds.  We postdilated with  a 3.25 x 8 mm Quantum Maverick in the proximal portion of the stent  inflating this with 1 inflation up to 16 atmospheres.  Final diagnostic  studies were then performed through guiding catheter.  The right femoral  artery was closed with  Angio-Seal at the end of the procedure.   RESULTS:  Initially, the stenosis in the circumflex marginal vessel was  estimated at 90%.  There was a fairly large mismatch between the caliber  of the vessel proximal lesion and distal lesion.  This remained despite  nitroglycerin.  Following stenting, the stenosis improved to 0%.   CONCLUSION:  Successful PCI of the lesion in the circumflex marginal  vessel using a Promus drug-eluting stent with improvement of center  narrowing from 90% to 0%.   DISPOSITION:  The patient was taken to the post-anesthesia care unit for  further observation.  The patient had some mild residual pain  postprocedure and his postprocedure ECG showed some minor inferior ST  changes.  This  occurred despite the fact that he had a good anatomic result and good  flow down the circumflex artery.  Possibly he may have had some distal  embolization, although this was not visualized angiographically.  We  will plan discharge  tomorrow if he remains stable.      Bruce Elvera Lennox  Juanda Chance, MD, [facility]  Electronically Signed     BRB/MEDQ  D:  11/20/2007  T:  11/20/2007  Job:  [id number]   cc:   Pricilla Riffle, MD, [facility]  Bruce R. Juanda Chance, MD, [facility]

## 2011-03-12 NOTE — Assessment & Plan Note (Signed)
[facility]                            CARDIOLOGY OFFICE NOTE   NAME:Donath, TOPHER BUENAVENTURA                   MRN:          [id number]  DATE:03/10/2008                            DOB:          1933/02/16    IDENTIFICATION:  Mr. Kemler is a gentleman who I have followed in  clinic.  He is a 77 year old soon to be 60.  He is status post  PTCA/stent drug eluting to an OM lesion with mild disease elsewhere.  Last seen again in February.   Since seen he is finishing cardiac rehab.  He says he has done okay.  He  says he just does not have energy to do things like he used to, not  painting.  He denies shortness of breath.  Notes occasional chest pains  that are fleeting, not associated with any particular activity.  Questions if they are GI.   CURRENT MEDICINES:  1. Include atenolol 25.  2. Aspirin 325.  3. Plavix 75.  4. Norvasc 5.  5. Crestor did not start yet.  6. HCTZ 37.5.   PHYSICAL EXAM:  GENERAL:  On exam the patient is in no distress.  VITAL SIGNS:  Blood pressure 129/77, pulse 60 and regular, weight 212  down from 217 last visit.  LUNGS:  Lungs are clear.  CARDIOVASCULAR:  Regular rate and rhythm, S1-S2, no S3, no murmurs.  ABDOMEN:  Benign.  EXTREMITIES:  No edema.   IMPRESSION:  1. Coronary artery disease.  I am not convinced there is any active      angina.  I do not think his decreased energy is because of poor      cardiac function.  Would continue on current medications.  2. Dyslipidemia.  Did not start the Crestor.  His last LDL was 104,      HDL 27.  I would give him samples of Simcor to see if he tolerates,      if not we will get him on the Crestor.  Continue exercising.  3. Energy, question depression.  We were concerned because he has      retired in the past year.  He now has had this happen.  He admits      that it has made him somewhat depressed.  His wife says he is more      forgetful.  Will need to see about getting  some help for this.      Again, I would like to have him back to doing normal activities and      wanting to do it.   He will be set up for followup LipoMed.  I have given him a prescription  for nitroglycerin.  Otherwise I will set to seen back in September and I  will be back in touch with him regarding further thoughts on possible  mental health issues.     Pricilla Riffle, MD, [facility]  Electronically Signed    PVR/MedQ  DD: 03/10/2008  DT: 03/10/2008  Job #: [id number]

## 2011-03-12 NOTE — Assessment & Plan Note (Signed)
[facility]                         GASTROENTEROLOGY OFFICE NOTE   NAME:[name], [name]                   MRN:          [id number]  DATE:03/02/2007                            DOB:          [birth year]    REFERRING PHYSICIAN:  Rosalyn Gess. Norins, MD   REASON FOR REFERRAL:  Dr. Debby Bud asked me to evaluate [name] in  consultation regarding colorectal cancer screening with colonoscopy.   HISTORY OF PRESENT ILLNESS:  [name] is a very pleasant 77 year old  man who had previously getting sigmoidoscopies every five years.  His  last sigmoidoscopy was in 2001.  He has never had polyps seen on those  examinations.  This year, he was recommended to get a colonoscopy, and  he presents to discuss that now.  He has had heart problems in the past.  They seem minor.  He has been cared for by Dr. Dietrich Pates for  palpitations.  He has not been put on any anticoagulation medicines,  rather this is being treated with beta blockade.   He has mild intermittent constipation.  No bleeding.  No diarrhea.   REVIEW OF SYSTEMS:  Notable for stable weight, otherwise essentially  normal and is available on his nursing intake sheet.   PAST MEDICAL HISTORY:  Palpitations with intermittent PVCs, on beta  blockers.  Hypertension.  Dyslipidemia.  Gout.  Psoriasis.  Status post  appendectomy, tonsillectomy, cyst excision from his right shoulder,  vasectomy, fracture of his right arm.   CURRENT MEDICATIONS:  Atenolol, Maxzide, aspirin, lisinopril.   ALLERGIES:  No known drug allergies.   SOCIAL HISTORY:  Married with three children.  Retired from [employer].  Nonsmoker, nondrinker.   FAMILY HISTORY:  Sister with breast cancer.  Father with heart disease.  No colon cancer or colon polyps in the family.   PHYSICAL EXAMINATION:  VITAL SIGNS:  Height 6 feet 1 inches.  Weight 225  pounds.  Blood pressure 118/72, pulse 64.  CONSTITUTIONAL:  Generally well-appearing.  NEUROLOGIC:  Alert and oriented x3.  HEENT:  Eyes:  Extraocular movements are intact.  Mouth:  Oropharynx  moist.  No lesions.  NECK:  Supple.  No lymphadenopathy.  LUNGS:  Clear to auscultation bilaterally.  CARDIOVASCULAR:  Heart has a regular rate and rhythm.  ABDOMEN:  Soft, nontender, nondistended.  Normal bowel sounds.  EXTREMITIES:  No lower extremity edema.  SKIN:  No rash or lesions on visible extremities.   ASSESSMENT/PLAN:  A 77 year old man at routine risk for colorectal  cancer.  I will proceed with full colonoscopy at his soonest  convenience.  He can stay on his aspirin up to the date of his  colonoscopy.  I see no reason for any further blood tests or imaging  studies prior to then.  He understands there are risks including missing  cancer, perforation, bleeding, as well as others.  The risks are small.  He understands and wishes to proceed.     Rachael Fee, MD  Electronically Signed    DPJ/MedQ  DD: 03/02/2007  DT: 03/02/2007  Job #: [id number]   cc:   Rosalyn Gess. Norins, MD

## 2011-03-12 NOTE — Assessment & Plan Note (Signed)
[facility]                            CARDIOLOGY OFFICE NOTE   NAME:[name], [name]                   MRN:          [id number]  DATE:06/30/2008                            DOB:          Oct 30, 1932    IDENTIFICATION:  [name] is a 77 year old gentleman with a history  of CAD (status post PTCA/drug-eluting stent to an OM lesion.  Mild  residual disease elsewhere).  I last saw him back in May.   When I saw him last, I was concerned that he was depressed.  It was not  convinced that there was any active angina.  He just did not have any  energy.   On visit today, he seems more energetic.  Speech is more vibrant.  He  notes occasional chest pain, but they only last seconds not associated  with any particular activity.  Breathing is okay.   CURRENT MEDICINES:  1. Atenolol 25.  2. Aspirin 325.  3. Plavix 75.  4. Norvasc 5.  5. Hydrochlorothiazide 37.5.  6. Crestor 10.   PHYSICAL EXAMINATION:  GENERAL:  The patient is in no distress.  VITAL SIGNS:  Blood pressure is 140/80, pulse 48 and regular, and weight  204, down from 212 on last visit.  LUNGS:  Clear.  CARDIAC:  Regular rate and rhythm.  S1 and S2.  No S3 and no murmurs.  ABDOMEN:  Benign.  EXTREMITIES:  No edema.   IMPRESSION:  1. Coronary artery disease.  I would continue his medical therapy.  I      am not convinced there are signs of active angina.  2. Hypertension, adequate control for now, was little lower in last      visit.  3. Dyslipidemia.  We will need to check his labs and increase in      Crestor.  We will also check a BMET at that time.   I will set to see him back in the winter.  I will be in touch with him  regarding the blood work and encouraged him to stay active.     Pricilla Riffle, MD, [facility]  Electronically Signed   PVR/MedQ  DD: 06/30/2008  DT: 07/01/2008  Job #: [id number]

## 2011-03-12 NOTE — Assessment & Plan Note (Signed)
[facility]                            CARDIOLOGY OFFICE NOTE   NAME:[name], [name]                   MRN:          [id number]  DATE:12/11/2007                            DOB:          12-04-1932    IDENTIFICATION:  [name] is a 77 year old gentleman who I last saw  in January, refer to this for full details; I actually had him go the  following day for cardiac catheterization and was concerned with  episodes of PND and chest tightness that he was having.  He went on to  have the procedure by [name] on the 20th and cardiac  catheterization showed a LAD with 50% proximal lesion.  Circumflex had a  tight 90% lesion in the OM1 (large vessel).  The circumflex itself had  30% lesions.  RCA had a 40% mid lesion.  The following day he underwent  PTCA/stent (drug-eluting) to the OM lesion and did well.   Since being at home he denies any PND.  No chest tightness.  He has had  some back pain for the past couple of days.  He actually took an aspirin  today and thinks it is feeling better.  It is in the thoracic spine area  mid back.   CURRENT MEDICATIONS:  1. Include atenolol 25.  2. Aspirin 325.  3. Lisinopril 10.  4. Hydrochlorothiazide 25.  5. Plavix 75.   REVIEW OF SYSTEMS:  Note the patient's wife is here today.  She says he  has got a dry cough that is intermittent.   PHYSICAL EXAM:  GENERAL:  On exam the patient is in no distress.  VITAL SIGNS:  Blood pressure 131/85, pulse is 57 and regular, weight  217.  LUNGS:  His lungs are clear.  CARDIOVASCULAR:  Cardiac exam regular rate and rhythm, S1-S2, no S3, no  murmurs.  BACK:  Tender on palpation in the mid thoracic spine.  Brings on  symptoms.  EXTREMITIES:  Right groin without hematoma or bruit.   A 12-lead EKG normal sinus bradycardia, 55 beats per minute, left bundle  branch block.   IMPRESSION:  1. Coronary artery disease post stent placement (drug-eluting).  His  back pain I think is musculoskeletal not any anginal equivalent.  I      would continue on current regimen of Plavix x1 year.  2. Hypertension.  I have asked him to stop lisinopril and see if the      cough gets better.  It should not be intermittent.  If it does not      change he should go back on it.  If it improves I will think of      another agent to use.  3. Dyslipidemia.  The patient had been on statin in the past with some      side effects, Zocor leading to a headache.  He was on Crestor and      the LFTs were normal but one check back in the winter of last year      they were elevated.  I am not convinced it was related to the  Crestor since we had previous labs that were okay.  What I would      like to do first though is make sure his LFTs are normal and we      will go ahead and check them today and I will be in touch with him      on where to proceed.   FOLLOWUP:  Otherwise for 3-4 months, sooner if problems develop.     Jacob Riffle, MD, [facility]  Electronically Signed    PVR/MedQ  DD: 12/11/2007  DT: 12/13/2007  Job #: [id number]

## 2011-03-12 NOTE — Cardiovascular Report (Signed)
NAME:  BABE, CLENNEY            ACCOUNT NO.:  [id number]   MEDICAL RECORD NO.:  [id number]          PATIENT TYPE:  OIB   LOCATION:  1962                         FACILITY:  [facility]   PHYSICIAN:  Everardo Beals. Juanda Chance, MD, FACCDATE OF BIRTH:  1932-12-30   DATE OF PROCEDURE:  11/17/2007  DATE OF DISCHARGE:                            CARDIAC CATHETERIZATION   CLINICAL HISTORY:  Mr. Diltz is 77 years old and was recently seen by  Dr. Dietrich Pates for symptoms of shortness of breath.  He appears to be  evaluated in early 2008 with a left chest adenosine Myoview scan, which  showed good LV function and no evidence of ischemia.  Because of  recurrent symptoms, he was scheduled for evaluation angiography.  He has  a history of hypertension and hyperlipidemia.  He has been intolerant to  both Lipitor and Crestor due to abnormal liver function tests.   PROCEDURE:  The procedure was performed by the right femoral arteries  and arterial sheath, and 4-French preformed coronary catheters.  A front  wall arterial puncture was performed and Omnipaque contrast was used.  The patient tolerated the procedure well and left the laboratory in  satisfactory condition.   RESULTS:  1. Aortic Pressure:  123/66 with a mean of 89.  2. Left Ventricular Pressure:  123/3.   1. LEFT MAIN CORONARY ARTERY:  Free of significant disease.  2. LEFT ANTERIOR DESCENDING ARTERY:  Gave rise to a large diagonal      branch and a septal perforator.  There was 50% narrowing in the      proximal portion of the diagonal branch.  There were irregularities      in the LAD, but no major obstruction.  3. CIRCUMFLEX ARTERY:  Gave rise to a large marginal branch and 2      posterolateral branches.  There was 30% proximal and 30% mid      stenosis in the circumflex artery.  There was 90% stenosis in the      proximal portion of the large marginal branch.  4. RIGHT CORONARY ARTERY:  A moderate sized vessel that gave rise to a      conus  branch, a right ventricular branch, a posterior descending      branch and 2 posterolateral branches.  There was a 40% narrowing in      the mid right coronary artery.   LEFT VENTRICULOGRAM:  Performed in the RAO projection; showed good wall  motion, although there was asynchronous contraction.  It was difficult  to evaluate the ejection fraction, but with the asynchronous contraction  it appeared to be 45-50%.   CONCLUSION:  Coronary artery disease with 50% narrowing in the diagonal  branch of the left anterior descending artery, 30% proximal and 30% mid  stenosis in the circumflex artery, with 90% stenosis in the large  marginal branch; 40% narrowing in the mid right coronary artery and mild  global hypokinesis with asynchronous contractions of the left ventricle,  associated with left bundle branch block.   RECOMMENDATIONS:  The patient has a tight lesion in the circumflex  artery.  I think this appears  to be a flow-limiting lesion, and we will  plan to bring the patient back on Friday, November 19, 2007,  for  percutaneous intervention.  We will start him on Plavix.      Bruce Elvera Lennox Juanda Chance, MD, [facility]  Electronically Signed     BRB/MEDQ  D:  11/17/2007  T:  11/17/2007  Job:  [id number]   cc:   Pricilla Riffle, MD, [facility]

## 2011-03-15 NOTE — Assessment & Plan Note (Signed)
[facility]                            CARDIOLOGY OFFICE NOTE   NAME:Obst, LATONYA KNIGHT                   MRN:          [id number]  DATE:12/08/2006                            DOB:          Feb 20, 1933    IDENTIFICATION:  Mr. Sesay is a 77 year old gentleman with a history  of hypertension, dyslipidemia, PVCs.  He was last seen in primary care  office back in December.  I saw him back in the spring.   In the interval he was noted on lab check to have elevated AST, ALT, and  was told to come off his Tricor and Crestor, to repeat over the next  several weeks.  His his liver enzymes did normalize.  The last check was  on December 03, 2006.   The patient overall has been doing okay but he has had some spells where  he has had some increased indigestion, maybe with exertion, he is a  little nervous about this.  He has also felt his heart continue to beat  some, again, he was concerned about this.  Still walks a half hour per  day at an average pace.  Doing a little painting.   CURRENT MEDICATIONS:  1. Atenolol 25 daily.  2. Maxzide 37.5/25 daily.  3. Fish oil daily.  4. Aspirin 325 p.r.n.  5. Lisinopril 10 daily.   PHYSICAL EXAMINATION:  The patient is in no distress.  Blood pressure is  127/79, pulse is 67, weight 220 this is up 5 pounds from May of last  year.  LUNGS:  Clear.  CARDIAC:  Regular rate and rhythm, S1, S2, no S3, no murmurs.  ABDOMEN:  Benign.  EXTREMITIES:  No edema, good distal pulses.   A 12 lead EKG revealed normal sinus bradycardia, 59 beats per minute ,  left bundle branch block unchanged.   IMPRESSION:  1. Indigestion.  Again, may just be GI.  He is concerned though about      it and I am not sure all the timing of his spells, but I would      schedule him for a Myoview scan to rule out inducible ischemias,      last one was several years ago.  2. Palpitations.  Signed him up for a Holter monitor.  3. Dyslipidemia.  I  would like to let his system restabilize off      statin and I would like to check lipid panels in a few month's      time.   I will set tentatively to see him June, again followup, hopefully lipids  sooner.     Pricilla Riffle, MD, [facility]  Electronically Signed    PVR/MedQ  DD: 12/08/2006  DT: 12/08/2006  Job #: [id number]

## 2011-03-15 NOTE — Assessment & Plan Note (Signed)
[facility]                           PRIMARY CARE OFFICE NOTE   NAME:[name], [name]                   MRN:          [id number]  DATE:10/06/2006                            DOB:          01-08-1933    HISTORY OF PRESENT ILLNESS:  [name] is a 77 year old gentleman who  presents for followup evaluation and exam.  He reports he has been  feeling relatively well more good days than bad days. He does complain  of occasional palpitations that are not fast or symptomatic and are more  noticeable at night.  The patient was last seen in primary care October 01, 2005.  In the interval, he has been seen on several occasions by Dr.  Dietrich Pates, who is helping manage the patient's hypertension and  hyperlipidemia with last office note from Mar 14, 2006 where she reports  him to be in good control with LDL of 92 and HDL of 33.5. The patient  did see Dr. Alonza Smoker April 12, 2006 for minor plantar fasciitis which  has resolved.   PAST MEDICAL HISTORY:  1. Hypertension.  2. Hyperlipidemia.  3. Palpitations with PVC's.  4. Gout.  5. Psoriasis.   PAST SURGICAL HISTORY:  1. Appendectomy.  2. Tonsillectomy.  3. Cyst excision right shoulder.  4. Vasectomy.  5. Fracture of his right arm.   CURRENT MEDICATIONS:  1. Atenolol 25 mg daily.  2. Maxzide 37.5/25 once daily.  3. Aspirin 81 mg daily.  4. Crestor 5 mg daily.  5. Lisinopril 10 mg daily.  6. Tricor 145 mg daily.  7. Fish oil daily.   FAMILY HISTORY:  Significant for father having passed of a myocardial  infarction at age 29.  Maternal aunt died of a myocardial infarction at  age 30.  Paternal uncle died of an MI at age 52.  A sister with breast  cancer at age 5.  Uncle with lung cancer.   SOCIAL HISTORY:  The patient has been married 49 years.  Has two  daughters and one son. He has many grandchildren.  He is retired from  [employer].  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   REVIEW OF  SYSTEMS:  Negative for any CONSTITUTIONAL problems.  The  patient has had an eye exam in the last 12 months which revealed a small  cataract.  He has had no ENT complaints. CARDIOVASCULAR:  Significant  for irregular palpitations.  RESPIRATORY:  No respiratory complaints  except for an AM cough. GI:  Treated as occasional heartburn for which  he takes no medications.  GENITOURINARY:  No GU complaints.  MUSCULOSKELETAL:  Significant for low back pain.  DERMATOLOGIC:  No  complaints.  NEUROLOGICAL:  No complaints.   CHART DATA REVIEW:  Last adenosine Cardiolite on September 01, 2003 with  no evidence of ischemia with ejection fraction estimated at 65%.  Last  colorectal exam was a flexible sigmoidoscopy in 2001.  Patient did see  [facility] after excision of a lipoma in 2006 and he was  doing well and was stable.  Last chest x-ray September 21, 2003 with  borderline cardiomegaly and otherwise negative.   PHYSICAL EXAMINATION:  VITAL SIGNS:  Temperature was 97.9, blood  pressure 125/76, pulse 53, weight 215.  GENERAL APPEARANCE:  This is a well-nourished, well-developed Caucasian  gentleman who looks his stated age in no acute distress.  HEENT:  Normocephalic, atraumatic.  EAC's and TM's were unremarkable.  Oropharynx with native dentition.  No buccal or palatal lesions noted.  Posterior pharynx was clear.  Conjunctivae and sclerae clear. Pupils  equal, round, reactive to light and accommodation..  Extraocular  movements intact..  Funduscopic exam with hand-held instrument was  unremarkable with no disc margin abnormalities, no vascular  abnormalities.  NECK:  Supple without thyromegaly.  LYMPH NODES:  No adenopathy was noted in the cervical, supraclavicular  region.  CHEST:  No costovertebral angle tenderness.  Lungs were clear to  auscultation and percussion.  CARDIOVASCULAR:  2+ radial pulses.  No JVD or carotid bruits.  He had a  quiet precordium.  He has a regular rate  and rhythm with rare PVC's.  ABDOMEN:  Soft, no guarding, no rebound, no organomegaly or splenomegaly  noted.  GENITOURINARY:  Genitalia normal male penis, bilaterally descended  testicles without masses.  RECTAL:  Normal sphincter tone was noted.  Prostate was smooth, round,  normal in size and contour.  EXTREMITIES:  Without cyanosis, clubbing, edema or deformity.  NEUROLOGICAL:  Exam was nonfocal.  DERMATOLOGIC:  The patient has mild psoriatic type changes as well as  multiple benign-appearing skin tags and skin lesions.   LABORATORY DATA:  TSH was normal at 2.23.  PSA was normal at 0.31.  Cholesterol was 151.  Triglycerides 97.  HDL was 34.9.  LDL was 97.  BMET was unremarkable with serum glucose of 92, creatinine 1.4, GFR of  53.  SGOT was elevated at 89.  SGPT was elevated at 141.   ASSESSMENT/PLAN:  1. Hyperlipidemia.  The patient is well controlled but he does have a      mild elevation of liver functions to 3x normal.  Would be concerned      this is related to his Statin therapy.  Will defer to Dr. Tenny Craw in      regards to change in medications and followup, although it may be      reasonable to consider a Statin drug holiday to see if his liver      function tests return to a normal level.  2. Hypertension.  The patient is adequately controlled at 125/76 and      will continue on his present medical regimen.  3. Palpitations, stable.  4. Gout.  Patient with no recent flares of gout.  5. Psoriasis, stable.  6. Health maintenance.  I discussed colorectal cancer screening with      the patient, definitely recommending he consider colonoscopy.  He      has agreed and will refer him to gastroenterology for this      procedure.   In summary, this is a pleasant gentleman who seems to be relatively  stable.  He does have a mild elevation in his transaminases.     Rosalyn Gess Norins, MD  Electronically Signed   MEN/MedQ  DD: 10/06/2006  DT: 10/07/2006  Job #: [id number]   cc:    Pricilla Riffle, MD, [facility]  Jabier Mutton

## 2011-03-15 NOTE — Op Note (Signed)
NAME:  CAREY, JOHNDROW NO.:  [id number]   MEDICAL RECORD NO.:  [id number]          PATIENT TYPE:  AMB   LOCATION:  DSC                          FACILITY:  [facility]   PHYSICIAN:  Adolph Pollack, M.D.DATE OF BIRTH:  1933-05-05   DATE OF PROCEDURE:  02/05/2005  DATE OF DISCHARGE:                                 OPERATIVE REPORT   PREOPERATIVE DIAGNOSIS:  Right proximal thigh soft tissue mass.   POSTOPERATIVE DIAGNOSIS:  Right proximal thigh soft tissue mass (measuring  11 cm).   PROCEDURE:  Excision of right thigh soft tissue mass with layered closure.   SURGEON:  Adolph Pollack, M.D.   ANESTHESIA:  General plus local.   INDICATION:  Mr. Pingree is a 77 year old male that has been knowing a soft  tissue mass has been slowly growing.  It has had some redness and drainage.  The skin is fairly tense.  He now presents for excision of the mass.  The  procedure and the risks were discussed with him preoperatively, as was  aftercare.   TECHNIQUE:  He was seen in the holding area and the mass marked with my  initials.  He was then brought to the operating room and placed supine on  the operating table and a general anesthetic was administered.  The right  thigh and hip area were sterilely prepped and draped.  Plain Marcaine 0.25%  solution was injected superficially over the mass and deep.  An elliptical  skin incision was made because of tenting in the skin and some reddened  areas.  This was carried down through the subcutaneous tissue.  I then  unroofed what appeared to be a large lipomatous mass measuring 11 total  centimeters.  Using sharp dissection and cautery, I was able to excise this  mass and send it to pathology.  It did not appear to be involving any of the  muscle.  Following this, I injected more local anesthetic into the bed of  the wound.  I then used electrocautery for hemostasis.  Once hemostasis was  adequate, I then closed the wound in 2  layers.  The first layer was  approximation of the subcutaneous tissues laterally and the deep  subcutaneous tissue; this was with 3-0 Vicryl suture.  The second layer of 3-  0 Monocryl subcuticular stitch for skin closure followed by Steri-Strips and  a sterile dressing.   He tolerated the procedure well without any apparent complications and was  taken to the recovery room in stable condition.  Discharge instructions and  medicine for pain will be given to him with a followup in the office in 10-  14 days, or sooner if he has any wound problems.     TJR/MEDQ  D:  02/05/2005  T:  02/05/2005  Job:  [id number]   cc:   Rosalyn Gess. Norins, M.D. [facility]

## 2011-07-17 ENCOUNTER — Other Ambulatory Visit: Payer: Self-pay | Admitting: Internal Medicine

## 2011-07-18 ENCOUNTER — Ambulatory Visit: Payer: Medicare Other

## 2011-07-18 LAB — BASIC METABOLIC PANEL
BUN: 16
BUN: 17
CO2: 27
Calcium: 9
Calcium: 9.3
Chloride: 102
Creatinine, Ser: 1.03
Creatinine, Ser: 1.07
GFR calc non Af Amer: >60
Potassium: 3.9
Potassium: 3.9

## 2011-07-18 LAB — CK TOTAL AND CKMB (NOT AT ARMC)
CK, MB: 1.7
Total CK: 49

## 2011-07-18 LAB — DIFFERENTIAL
Lymphocytes Relative: 41
Monocytes Absolute: 0.8
Monocytes Relative: 10

## 2011-07-18 LAB — CBC
HCT: 42.4
HCT: 49.1
Hemoglobin: 14.7
MCHC: 34.6
MCV: 97.4
Platelets: 190
Platelets: 227
WBC: 7.2

## 2011-07-19 ENCOUNTER — Ambulatory Visit (INDEPENDENT_AMBULATORY_CARE_PROVIDER_SITE_OTHER): Payer: Medicare Other | Admitting: *Deleted

## 2011-07-19 DIAGNOSIS — Z23 Encounter for immunization: Secondary | ICD-10-CM

## 2011-07-26 ENCOUNTER — Ambulatory Visit (INDEPENDENT_AMBULATORY_CARE_PROVIDER_SITE_OTHER): Payer: Medicare Other | Admitting: Internal Medicine

## 2011-07-26 ENCOUNTER — Encounter: Payer: Self-pay | Admitting: Internal Medicine

## 2011-07-26 VITALS — BP 133/81 | HR 52 | Ht 72.0 in | Wt 203.0 lb

## 2011-07-26 DIAGNOSIS — I10 Essential (primary) hypertension: Secondary | ICD-10-CM

## 2011-07-26 DIAGNOSIS — I2510 Atherosclerotic heart disease of native coronary artery without angina pectoris: Secondary | ICD-10-CM

## 2011-07-26 DIAGNOSIS — E785 Hyperlipidemia, unspecified: Secondary | ICD-10-CM

## 2011-07-26 NOTE — Assessment & Plan Note (Signed)
Will get lipids in December with Dr. Debby Bud.

## 2011-07-26 NOTE — Assessment & Plan Note (Signed)
Occasional CP I do not think they represent angina.  Continue meds.

## 2011-07-26 NOTE — Assessment & Plan Note (Signed)
BP is good

## 2011-07-26 NOTE — Progress Notes (Signed)
HPI Patient is a 77 year old with a history of CAD, hypertension, dyslipidemia. I saw him back in April of this year.  Occasional SOB while watching TV. No SOB with walking.  Goes to [employer].  No stopping. Works in yard.  Occasional Cp not not consistent or signif.  Allergies  Allergen Reactions  . Niacin     Current Outpatient Prescriptions  Medication Sig Dispense Refill  . amLODipine (NORVASC) 2.5 MG tablet Take 2.5 mg by mouth. Take 1/2 tablet daily       . aspirin 81 MG tablet Take 81 mg by mouth daily.        [name] atenolol (TENORMIN) 25 MG tablet Take 25 mg by mouth daily.        . CRESTOR 10 MG tablet TAKE 1 TABLET DAILY (PRIOR TO BEDTIME)  90 tablet  1  . nitroGLYCERIN (NITROSTAT) 0.4 MG SL tablet Place 0.4 mg under the tongue every 5 (five) minutes as needed.        [name] PLAVIX 75 MG tablet TAKE 1 TABLET DAILY  90 tablet  2  . triamterene-hydrochlorothiazide (MAXZIDE-25) 37.5-25 MG per tablet Take 1 tablet by mouth daily.          Past Medical History  Diagnosis Date  . Coronary artery disease     stent (promus) Cx/OM'09  . Osteoarthritis     right ankle  . Recurrent skin cancer   . Prostatitis, acute   . Psoriasis   . Gout   . PVC (premature ventricular contraction)     hx  . Palpitations   . Hyperlipidemia   . Hypertension     Past Surgical History  Procedure Date  . Vasectomy   . Tonsillectomy   . Appendectomy     Family History  Problem Relation Age of Onset  . Heart attack Father 51    died  . Heart attack  50    maternal aunt died  . Heart attack  72    paternal uncle died  . Parkinsonism Mother 38    died  . Breast cancer Sister     died  . Lung cancer      uncle died    History   Social History  . Marital Status: Married    Spouse Name: N/A    Number of Children: 3  . Years of Education: N/A   Occupational History  . retired     from [employer].   Social History Main Topics  . Smoking status: Never Smoker   . Smokeless  tobacco: Not on file  . Alcohol Use: Not on file  . Drug Use: Not on file  . Sexually Active: Not on file   Other Topics Concern  . Not on file   Social History Narrative  . No narrative on file    Review of Systems:  All systems reviewed.  They are negative to the above problem except as previously stated.  Vital Signs: BP 133/81  Pulse 52  Ht 6' (1.829 m)  Wt 203 lb (92.08 kg)  BMI 27.53 kg/m2  Physical Exam  Patient in NAD  HEENT:  Normocephalic, atraumatic. EOMI, PERRLA.  Neck: JVP is normal. No thyromegaly. No bruits.  Lungs: clear to auscultation. No rales no wheezes.  Heart: Regular rate and rhythm. Normal S1, S2. No S3.   No significant murmurs. PMI not displaced.  Abdomen:  Supple, nontender. Normal bowel sounds. No masses. No hepatomegaly.  Extremities:   Good distal  pulses throughout. No lower extremity edema.  Musculoskeletal :moving all extremities.  Neuro:   alert and oriented x3.  CN II-XII grossly intact.  EKG:  Sinus bradycardia.  56 bpm. RBBB>  LAFB.   Assessment and Plan:

## 2011-08-19 ENCOUNTER — Other Ambulatory Visit: Payer: Self-pay

## 2011-08-19 MED ORDER — TRIAMTERENE-HCTZ 37.5-25 MG PO TABS: 1.0000 | ORAL_TABLET | Freq: Every day | ORAL | Status: DC

## 2011-08-19 NOTE — Telephone Encounter (Signed)
.   Requested Prescriptions   Pending Prescriptions Disp Refills  . triamterene-hydrochlorothiazide (MAXZIDE-25) 37.5-25 MG per tablet 90 tablet 4    Sig: Take 1 each (1 tablet total) by mouth daily.   E-scribe to [employer].

## 2011-10-24 ENCOUNTER — Encounter: Payer: Self-pay | Admitting: Internal Medicine

## 2011-10-24 DIAGNOSIS — R079 Chest pain, unspecified: Secondary | ICD-10-CM

## 2011-10-28 ENCOUNTER — Other Ambulatory Visit (INDEPENDENT_AMBULATORY_CARE_PROVIDER_SITE_OTHER): Payer: Medicare Other

## 2011-10-28 ENCOUNTER — Ambulatory Visit (INDEPENDENT_AMBULATORY_CARE_PROVIDER_SITE_OTHER): Payer: Medicare Other | Admitting: Internal Medicine

## 2011-10-28 DIAGNOSIS — Z125 Encounter for screening for malignant neoplasm of prostate: Secondary | ICD-10-CM

## 2011-10-28 DIAGNOSIS — M109 Gout, unspecified: Secondary | ICD-10-CM

## 2011-10-28 DIAGNOSIS — M19079 Primary osteoarthritis, unspecified ankle and foot: Secondary | ICD-10-CM

## 2011-10-28 DIAGNOSIS — I10 Essential (primary) hypertension: Secondary | ICD-10-CM

## 2011-10-28 DIAGNOSIS — E785 Hyperlipidemia, unspecified: Secondary | ICD-10-CM

## 2011-10-28 DIAGNOSIS — Z0000 Encounter for general adult medical examination without abnormal findings: Secondary | ICD-10-CM

## 2011-10-28 DIAGNOSIS — I2510 Atherosclerotic heart disease of native coronary artery without angina pectoris: Secondary | ICD-10-CM

## 2011-10-28 DIAGNOSIS — N429 Disorder of prostate, unspecified: Secondary | ICD-10-CM

## 2011-10-28 LAB — COMPREHENSIVE METABOLIC PANEL
ALT: 15 U/L (ref 0–53)
AST: 18 U/L (ref 0–37)
Chloride: 103 mEq/L (ref 96–112)
Creatinine, Ser: 1 mg/dL (ref 0.4–1.5)
Sodium: 138 mEq/L (ref 135–145)
Total Bilirubin: 0.9 mg/dL (ref 0.3–1.2)
Total Protein: 6.8 g/dL (ref 6.0–8.3)

## 2011-10-28 LAB — CBC WITH DIFFERENTIAL/PLATELET
Basophils Absolute: 0 10*3/uL (ref 0.0–0.1)
Basophils Relative: 0.4 % (ref 0.0–3.0)
Eosinophils Absolute: 0.1 10*3/uL (ref 0.0–0.7)
MCHC: 34.2 g/dL (ref 30.0–36.0)
MCV: 99.3 fl (ref 78.0–100.0)
Monocytes Absolute: 0.5 10*3/uL (ref 0.1–1.0)
Neutrophils Relative %: 53.1 % (ref 43.0–77.0)
Platelets: 199 10*3/uL (ref 150.0–400.0)
RDW: 14.3 % (ref 11.5–14.6)

## 2011-10-28 LAB — HEPATIC FUNCTION PANEL
ALT: 15 U/L (ref 0–53)
AST: 18 U/L (ref 0–37)
Albumin: 4 g/dL (ref 3.5–5.2)
Alkaline Phosphatase: 50 U/L (ref 39–117)
Total Protein: 6.8 g/dL (ref 6.0–8.3)

## 2011-10-28 LAB — LIPID PANEL
HDL: 33.6 mg/dL — ABNORMAL LOW (ref >39.00)
LDL Cholesterol: 68 mg/dL (ref 0–99)
Total CHOL/HDL Ratio: 4
Triglycerides: 139 mg/dL (ref 0.0–149.0)

## 2011-10-28 NOTE — Progress Notes (Signed)
Subjective:    Patient ID: [name]., male    DOB: [birth year], 77 y.o.   MRN: [id number]  HPI  The patient is here for annual Medicare wellness examination and management of other chronic and acute problems.  He does c/o acid reflux 3-4  Times a week relieved by prilosec. He is concerned about cost.  He has tympanostomy tubes place - the right one has come out.    The risk factors are reflected in the social history.  The roster of all physicians providing medical care to patient - is listed in the Snapshot section of the chart.  Activities of daily living:  The patient is 100% inedpendent in all ADLs: dressing, toileting, feeding as well as independent mobility  Home safety : The patient has smoke detectors in the home. They wear seatbelts.  firearms are present in the home, kept in a safe fashion. There is no violence in the home.   There is no risks for hepatitis, STDs or HIV. There is no   history of blood transfusion. They have no travel history to infectious disease endemic areas of the world.  The patient has seen their dentist in the last 12 month. They have seen their eye doctor in the last year. They deny any hearing difficulty and have not had audiologic testing in the last year.  They do not  have excessive sun exposure. Discussed the need for sun protection: hats, long sleeves and use of sunscreen if there is significant sun exposure.   Diet: the importance of a healthy diet is discussed. They do have a unhealthy diet, he does his own cooking.  The patient has a regular exercise program: walking ,30 min duration, 5 days per week.  The benefits of regular aerobic exercise were discussed.  Depression screen: there are no signs or vegative symptoms of depression- irritability, change in appetite, anhedonia, sadness/tearfullness.  Cognitive assessment: the patient manages all their financial and personal affairs and is actively engaged. They could relate  day,date,year and events; recalled 3/3 objects at 3 minutes.  The following portions of the patient's history were reviewed and updated as appropriate: allergies, current medications, past family history, past medical history,  past surgical history, past social history  and problem list.  Vision, hearing, body mass index were assessed and reviewed.   During the course of the visit the patient was educated and counseled about appropriate screening and preventive services including : fall prevention , diabetes screening, nutrition counseling, colorectal cancer screening, and recommended immunizations.  Past Medical History  Diagnosis Date  . Coronary artery disease     stent (promus) Cx/OM'09  . Osteoarthritis     right ankle  . Recurrent skin cancer   . Prostatitis, acute   . Psoriasis   . Gout   . PVC (premature ventricular contraction)     hx  . Palpitations   . Hyperlipidemia   . Hypertension   . CAD 01/16/2009  . CHEST PAIN UNSPECIFIED 11/06/2009  . GOUT 10/13/2007  . HEARING LOSS 11/05/2010  . HYPERLIPIDEMIA 10/13/2007  . HYPERTENSION 10/13/2007  . OSTEOARTHRITIS, ANKLE, RIGHT 10/13/2007  . 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 PALPITATIONS, HX OF 10/13/2007  . PREMATURE VENTRICULAR CONTRACTIONS 10/13/2007  . PROSTATITIS, ACUTE, HX OF 10/13/2007  . PSORIASIS 10/13/2007  . SKIN CANCER, RECURRENT 10/13/2007   Past Surgical History  Procedure Date  . Vasectomy   . Tonsillectomy   . Appendectomy   . Tonsillectomy   .  Cyst on right shoulder   . Fracture right arm    Family History  Problem Relation Age of Onset  . Heart attack Father 53    died  . Heart attack  50    paternal uncle died/maternal aunt died  . Parkinsonism Mother 58    died  . Breast cancer Sister     died  . Lung cancer      uncle died   History   Social History  . Marital Status: Married    Spouse Name: N/A    Number of Children: 3  . Years of Education: N/A   Occupational  History  . retired     from [employer].   Social History Main Topics  . Smoking status: Never Smoker   . Smokeless tobacco: Not on file  . Alcohol Use: Not on file  . Drug Use: Not on file  . Sexually Active: Not on file   Other Topics Concern  . 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  HEENT:  Negative for hearing loss, ear pain, congestion, neck stiffness and postnasal drip. Negative for sore throat or swallowing problems. Negative for dental complaints.   Eyes: Negative for vision loss or change in visual acuity.  Respiratory: Negative for chest tightness and wheezing. Negative for DOE. Has occasional feeling of breathlessness  Cardiovascular: Negative for chest pain or palpitations. No decreased exercise tolerance Gastrointestinal: No change in bowel habit. No bloating or gas. No reflux or indigestion Genitourinary: Negative for urgency, frequency, flank pain and difficulty urinating.  Musculoskeletal: Negative for myalgias, back pain, arthralgias and gait problem.  Neurological: Negative for dizziness, tremors, weakness and headaches.  Hematological: Negative for adenopathy.  Psychiatric/Behavioral: Negative for behavioral problems and dysphoric mood.       Objective:   Physical Exam Vital signs reviewed Gen'l: Well nourished well developed white male in no acute distress  HEENT: Head: Normocephalic and atraumatic. Right Ear: External ear normal. EAC/TM nl. Left Ear: External ear normal.  EAC-nl/TM wqith ear tube. Nose: Nose normal. Mouth/Throat: Oropharynx is clear and moist. Dentition - native, in good repair. No buccal or palatal lesions. Posterior  pharynx clear. Eyes: Conjunctivae and sclera clear. EOM intact. Pupils are equal, round, and reactive to light. Right eye exhibits no discharge. Left eye exhibits no discharge. Neck: Normal range of motion. Neck supple. No JVD present. No tracheal deviation present. No thyromegaly present.  Cardiovascular: Normal rate, regular rhythm, no gallop, no friction rub, no murmur heard.      Quiet precordium. 2+ radial and DP pulses . No carotid bruits Pulmonary/Chest: Effort normal. No respiratory distress or increased WOB, no wheezes, no rales. No chest wall deformity or CVAT. Abdominal: Soft. Bowel sounds are normal in all quadrants. He exhibits no distension, no tenderness, no rebound or guarding, No heptosplenomegaly  Genitourinary:  deferred to PSA Musculoskeletal: Normal range of motion. He exhibits no edema and no tenderness.       Small and large joints without redness, synovial thickening or deformity. Full range of motion preserved about all small, median and large joints.  Lymphadenopathy:    He has no cervical or supraclavicular adenopathy.  Neurological: He is alert and oriented to person, place, and time. CN II-XII intact. DTRs 2+ and symmetrical biceps, radial and patellar tendons. Cerebellar function normal with no tremor, rigidity, normal gait and station.  Skin: Skin is warm and dry. No rash noted. No erythema.  Psychiatric: He has a normal mood and affect. His behavior is normal. Thought content normal.   Lab Results  Component Value Date   WBC 5.7 10/28/2011   HGB 16.1 10/28/2011   HCT 47.1 10/28/2011   PLT 199.0 10/28/2011   GLUCOSE 97 10/28/2011   CHOL 129 10/28/2011   TRIG 139.0 10/28/2011   HDL 33.60* 10/28/2011   LDLDIRECT 128.0 11/16/2007   LDLCALC 68 10/28/2011   ALT 15 10/28/2011   ALT 15 10/28/2011   AST 18 10/28/2011   AST 18 10/28/2011   NA 138 10/28/2011   K 4.0 10/28/2011   CL 103 10/28/2011   CREATININE 1.0 10/28/2011   BUN 15 10/28/2011   CO2 30  10/28/2011   PSA 0.33 10/28/2011          Assessment & Plan:

## 2011-10-29 DIAGNOSIS — Z0000 Encounter for general adult medical examination without abnormal findings: Secondary | ICD-10-CM | POA: Insufficient documentation

## 2011-10-29 NOTE — Assessment & Plan Note (Signed)
No complaints of chest pain or decreased exercise tolerance. Risk factor modification in place.

## 2011-10-29 NOTE — Assessment & Plan Note (Signed)
BP Readings from Last 3 Encounters:  10/28/11 118/80  07/26/11 133/81  02/11/11 128/80   Good control - continue present medication

## 2011-10-29 NOTE — Assessment & Plan Note (Signed)
No recent flares of gout

## 2011-10-29 NOTE — Assessment & Plan Note (Signed)
Interval medical history is benign. Physical exam, sans prostate (overe 77), normal. Lab results are in normal limits. He is current with colorectal cancer screening. Current with prostate screening with normal PSA. Immunizations are up to date except for Shingles.  In summary- a very nice man who is medically stable and doing well. He is encouraged to continue to exercise on a regular basis. He will return as needed or in 1 year.

## 2011-10-29 NOTE — Assessment & Plan Note (Signed)
Excellent control with LDL better than goal of 80 or less.  Plan - continue present regimen

## 2011-11-07 ENCOUNTER — Other Ambulatory Visit: Payer: Self-pay

## 2011-11-07 MED ORDER — CLOPIDOGREL BISULFATE 75 MG PO TABS: 75.0000 mg | ORAL_TABLET | Freq: Every day | ORAL | Status: DC

## 2011-11-14 DIAGNOSIS — H02059 Trichiasis without entropian unspecified eye, unspecified eyelid: Secondary | ICD-10-CM | POA: Diagnosis not present

## 2011-11-18 ENCOUNTER — Other Ambulatory Visit: Payer: Self-pay | Admitting: Internal Medicine

## 2011-11-26 ENCOUNTER — Other Ambulatory Visit: Payer: Self-pay | Admitting: Internal Medicine

## 2011-11-27 MED ORDER — CLOPIDOGREL BISULFATE 75 MG PO TABS: 75.0000 mg | ORAL_TABLET | Freq: Every day | ORAL | Status: DC

## 2012-01-09 DIAGNOSIS — H02059 Trichiasis without entropian unspecified eye, unspecified eyelid: Secondary | ICD-10-CM | POA: Diagnosis not present

## 2012-01-13 ENCOUNTER — Encounter: Payer: Self-pay | Admitting: Gastroenterology

## 2012-01-21 ENCOUNTER — Other Ambulatory Visit: Payer: Self-pay | Admitting: Internal Medicine

## 2012-01-23 ENCOUNTER — Other Ambulatory Visit: Payer: Self-pay | Admitting: *Deleted

## 2012-01-23 MED ORDER — NITROGLYCERIN 0.4 MG SL SUBL: 0.4000 mg | SUBLINGUAL_TABLET | SUBLINGUAL | Status: DC | PRN

## 2012-02-16 ENCOUNTER — Other Ambulatory Visit: Payer: Self-pay | Admitting: Internal Medicine

## 2012-02-17 NOTE — Telephone Encounter (Signed)
..   Requested Prescriptions   Pending Prescriptions Disp Refills  . atenolol (TENORMIN) 25 MG tablet [Pharmacy Med Name: ATENOLOL 25MG        TAB] 30 tablet 5    Sig: TAKE ONE TABLET BY MOUTH EVERY DAY

## 2012-02-27 ENCOUNTER — Encounter: Payer: Self-pay | Admitting: Internal Medicine

## 2012-02-27 ENCOUNTER — Ambulatory Visit (INDEPENDENT_AMBULATORY_CARE_PROVIDER_SITE_OTHER): Payer: Medicare Other | Admitting: Internal Medicine

## 2012-02-27 VITALS — BP 130/80 | HR 57 | Ht 72.0 in | Wt 210.0 lb

## 2012-02-27 DIAGNOSIS — I2510 Atherosclerotic heart disease of native coronary artery without angina pectoris: Secondary | ICD-10-CM | POA: Diagnosis not present

## 2012-02-27 MED ORDER — ATENOLOL 25 MG PO TABS: 25.0000 mg | ORAL_TABLET | Freq: Every day | ORAL | Status: DC

## 2012-02-27 MED ORDER — OMEPRAZOLE 20 MG PO CPDR: 20.0000 mg | DELAYED_RELEASE_CAPSULE | Freq: Two times a day (BID) | ORAL | Status: DC

## 2012-02-27 NOTE — Progress Notes (Addendum)
HPI Patient is a 78 year old with a history of CAD, hypertension, dyslipidemia. SInce seen has had good and bad days.  Notes no SOB  No real pains. A few months ago couldn't   Allergies  Allergen Reactions  . Niacin     Current Outpatient Prescriptions  Medication Sig Dispense Refill  . amLODipine (NORVASC) 2.5 MG tablet TAKE 1 TABLET DAILY  90 tablet  4  . aspirin 81 MG tablet Take 81 mg by mouth daily.        [name] atenolol (TENORMIN) 25 MG tablet TAKE ONE TABLET BY MOUTH EVERY DAY  30 tablet  5  . clopidogrel (PLAVIX) 75 MG tablet Take 1 tablet (75 mg total) by mouth daily.  90 tablet  2  . CRESTOR 10 MG tablet TAKE 1 TABLET DAILY (PRIOR TO BEDTIME)  90 tablet  0  . nitroGLYCERIN (NITROSTAT) 0.4 MG SL tablet Place 1 tablet (0.4 mg total) under the tongue every 5 (five) minutes as needed.  25 tablet  11  . omeprazole (PRILOSEC) 20 MG capsule 1 tab daily      . triamterene-hydrochlorothiazide (MAXZIDE-25) 37.5-25 MG per tablet Take 1 tablet by mouth daily.      [name] DISCONTD: triamterene-hydrochlorothiazide (MAXZIDE-25) 37.5-25 MG per tablet Take 1 each (1 tablet total) by mouth daily.  90 tablet  4    Past Medical History  Diagnosis Date  . Coronary artery disease     stent (promus) Cx/OM'09  . Osteoarthritis     right ankle  . Recurrent skin cancer   . Prostatitis, acute   . Psoriasis   . Gout   . PVC (premature ventricular contraction)     hx  . Palpitations   . Hyperlipidemia   . Hypertension   . CAD 01/16/2009  . CHEST PAIN UNSPECIFIED 11/06/2009  . GOUT 10/13/2007  . HEARING LOSS 11/05/2010  . HYPERLIPIDEMIA 10/13/2007  . HYPERTENSION 10/13/2007  . OSTEOARTHRITIS, ANKLE, RIGHT 10/13/2007  . 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 PALPITATIONS, HX OF 10/13/2007  . PREMATURE VENTRICULAR CONTRACTIONS 10/13/2007  . PROSTATITIS, ACUTE, HX OF 10/13/2007  . PSORIASIS 10/13/2007  . SKIN CANCER, RECURRENT 10/13/2007    Past Surgical History  Procedure Date  . Vasectomy    . Tonsillectomy   . Appendectomy   . Tonsillectomy   . Cyst on right shoulder   . Fracture right arm     Family History  Problem Relation Age of Onset  . Heart attack Father 1    died  . Heart attack  50    paternal uncle died/maternal aunt died  . Parkinsonism Mother 58    died  . Breast cancer Sister     died  . Lung cancer      uncle died    History   Social History  . Marital Status: Married    Spouse Name: N/A    Number of Children: 3  . Years of Education: N/A   Occupational History  . retired     from [employer].   Social History Main Topics  . Smoking status: Never Smoker   . Smokeless tobacco: Not on file  . Alcohol Use: Not on file  . Drug Use: Not on file  . Sexually Active: Not on file   Other Topics Concern  . 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  They are negative to the above problem except as previously stated.  Vital Signs: BP 130/80  Pulse 57  Ht 6' (1.829 m)  Wt 210 lb (95.255 kg)  BMI 28.48 kg/m2  Physical Exam Patinet in NAD HEENT:  Normocephalic, atraumatic. EOMI, PERRLA.  Neck: JVP is normal. No thyromegaly. No bruits.  Lungs: clear to auscultation. No rales no wheezes.  Heart: Regular rate and rhythm. Normal S1, S2. No S3.   No significant murmurs. PMI not displaced.  Abdomen:  Supple, nontender. Normal bowel sounds. No masses. No hepatomegaly.  Extremities:   Good distal pulses throughout. No lower extremity edema.  Musculoskeletal :moving all extremities.  Neuro:   alert and oriented x3.  CN II-XII grossly intact.  EKG:  SB  RBBB.  LAFB. Assessment and Plan:  1.  CAD  Stable  NO angina  Will review cath re Plavix.  2.  Dyslpidemia  Lipids good  3.  HTN  Good. 4.   Reflux.  Discussed food avoidence.  Should improve some if loses wt.  Will go up on omprezole.

## 2012-02-27 NOTE — Patient Instructions (Signed)
Your physician wants you to follow-up in: December 2013 You will receive a reminder letter in the mail two months in advance. If you don't receive a letter, please call our office to schedule the follow-up appointment.  

## 2012-03-09 DIAGNOSIS — L821 Other seborrheic keratosis: Secondary | ICD-10-CM | POA: Diagnosis not present

## 2012-03-09 DIAGNOSIS — L570 Actinic keratosis: Secondary | ICD-10-CM | POA: Diagnosis not present

## 2012-03-10 DIAGNOSIS — H02059 Trichiasis without entropian unspecified eye, unspecified eyelid: Secondary | ICD-10-CM | POA: Diagnosis not present

## 2012-04-14 ENCOUNTER — Ambulatory Visit (INDEPENDENT_AMBULATORY_CARE_PROVIDER_SITE_OTHER): Payer: Medicare Other | Admitting: Internal Medicine

## 2012-04-14 ENCOUNTER — Encounter: Payer: Self-pay | Admitting: Internal Medicine

## 2012-04-14 VITALS — BP 122/80 | HR 78 | Temp 97.1°F | Resp 16 | Ht 72.0 in | Wt 210.0 lb

## 2012-04-14 DIAGNOSIS — M109 Gout, unspecified: Secondary | ICD-10-CM | POA: Diagnosis not present

## 2012-04-15 NOTE — Assessment & Plan Note (Signed)
No elevation of uric. The symptoms he describes are atypical for gout. Suspect flare of OA.  Plan  At next flare of pain either aspiration for crystals or trial of colchicine

## 2012-04-15 NOTE — Progress Notes (Signed)
Subjective:    Patient ID: [name]., male    DOB: 1933/07/26, 78 y.o.   MRN: [id number]  HPI Mr. Mato presents for recent episode of pain at the right great toe. He is concerned for gout. He reports there was sharp pain that was short lived. There was no bright erythema, no heat and no exquisite tenderness. Chart reviewed - Uric acid has been check annually since '09 - always in normal range. He is in no pain today.  Past Medical History  Diagnosis Date  . Coronary artery disease     stent (promus) Cx/OM'09  . Osteoarthritis     right ankle  . Recurrent skin cancer   . Prostatitis, acute   . Psoriasis   . Gout   . PVC (premature ventricular contraction)     hx  . Palpitations   . Hyperlipidemia   . Hypertension   . CAD 01/16/2009  . CHEST PAIN UNSPECIFIED 11/06/2009  . GOUT 10/13/2007  . HEARING LOSS 11/05/2010  . HYPERLIPIDEMIA 10/13/2007  . HYPERTENSION 10/13/2007  . OSTEOARTHRITIS, ANKLE, RIGHT 10/13/2007  . 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 PALPITATIONS, HX OF 10/13/2007  . PREMATURE VENTRICULAR CONTRACTIONS 10/13/2007  . PROSTATITIS, ACUTE, HX OF 10/13/2007  . PSORIASIS 10/13/2007  . SKIN CANCER, RECURRENT 10/13/2007   Past Surgical History  Procedure Date  . Vasectomy   . Tonsillectomy   . Appendectomy   . Tonsillectomy   . Cyst on right shoulder   . Fracture right arm    Family History  Problem Relation Age of Onset  . Heart attack Father 13    died  . Heart attack  50    paternal uncle died/maternal aunt died  . Parkinsonism Mother 109    died  . Breast cancer Sister     died  . Lung cancer      uncle died   History   Social History  . Marital Status: Married    Spouse Name: N/A    Number of Children: 3  . Years of Education: N/A   Occupational History  . retired     from [employer].   Social History Main Topics  . Smoking status: Never Smoker   . Smokeless tobacco: Not on file  . Alcohol Use: Not on file  . Drug Use:  Not on file  . Sexually Active: Not on file   Other Topics Concern  . 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 amLODipine (NORVASC) 2.5 MG tablet TAKE 1 TABLET DAILY  90 tablet  4  . aspirin 81 MG tablet Take 81 mg by mouth daily.        [name] atenolol (TENORMIN) 25 MG tablet Take 1 tablet (25 mg total) by mouth daily.  90 tablet  3  . clopidogrel (PLAVIX) 75 MG tablet Take 1 tablet (75 mg total) by mouth daily.  90 tablet  2  . CRESTOR 10 MG tablet TAKE 1 TABLET DAILY (PRIOR TO BEDTIME)  90 tablet  0  . nitroGLYCERIN (NITROSTAT) 0.4 MG SL tablet Place 1 tablet (0.4 mg total) under the tongue every 5 (five) minutes as needed.  25 tablet  11  .  triamterene-hydrochlorothiazide (MAXZIDE-25) 37.5-25 MG per tablet Take 1 tablet by mouth daily.      [name] omeprazole (PRILOSEC) 20 MG capsule Take 1 capsule (20 mg total) by mouth 2 (two) times daily. 1 tab daily  60 capsule  6      Review of Systems System review is negative for any constitutional, cardiac, pulmonary, GI or neuro symptoms or complaints other than as described in the HPI.     Objective:   Physical Exam Filed Vitals:   04/14/12 1413  BP: 122/80  Pulse: 78  Temp: 97.1 F (36.2 C)  Resp: 16   Gen'l - WNWD white man in no distress Cor- RRR Pulm - normal respirations MSK - !st MTP right appears normal without joint change, redness or tenderness. Normal ROM without pain       Assessment & Plan:

## 2012-04-20 ENCOUNTER — Other Ambulatory Visit: Payer: Self-pay | Admitting: Internal Medicine

## 2012-05-12 DIAGNOSIS — H02059 Trichiasis without entropian unspecified eye, unspecified eyelid: Secondary | ICD-10-CM | POA: Diagnosis not present

## 2012-07-06 ENCOUNTER — Telehealth: Payer: Self-pay | Admitting: *Deleted

## 2012-07-06 NOTE — Telephone Encounter (Addendum)
Called patient to advise that per Dr. Tenny Craw he may stop PLAVIX. Patient verbalized understanding.

## 2012-07-08 ENCOUNTER — Ambulatory Visit (INDEPENDENT_AMBULATORY_CARE_PROVIDER_SITE_OTHER): Payer: Medicare Other | Admitting: *Deleted

## 2012-07-08 DIAGNOSIS — Z23 Encounter for immunization: Secondary | ICD-10-CM

## 2012-07-09 DIAGNOSIS — H02059 Trichiasis without entropian unspecified eye, unspecified eyelid: Secondary | ICD-10-CM | POA: Diagnosis not present

## 2012-08-24 DIAGNOSIS — L570 Actinic keratosis: Secondary | ICD-10-CM | POA: Diagnosis not present

## 2012-08-24 DIAGNOSIS — Z85828 Personal history of other malignant neoplasm of skin: Secondary | ICD-10-CM | POA: Diagnosis not present

## 2012-08-24 DIAGNOSIS — D485 Neoplasm of uncertain behavior of skin: Secondary | ICD-10-CM | POA: Diagnosis not present

## 2012-08-24 DIAGNOSIS — L821 Other seborrheic keratosis: Secondary | ICD-10-CM | POA: Diagnosis not present

## 2012-09-14 DIAGNOSIS — H02059 Trichiasis without entropian unspecified eye, unspecified eyelid: Secondary | ICD-10-CM | POA: Diagnosis not present

## 2012-09-15 ENCOUNTER — Ambulatory Visit (INDEPENDENT_AMBULATORY_CARE_PROVIDER_SITE_OTHER): Payer: Medicare Other | Admitting: Internal Medicine

## 2012-09-15 ENCOUNTER — Encounter: Payer: Self-pay | Admitting: Internal Medicine

## 2012-09-15 ENCOUNTER — Ambulatory Visit (INDEPENDENT_AMBULATORY_CARE_PROVIDER_SITE_OTHER)
Admission: RE | Admit: 2012-09-15 | Discharge: 2012-09-15 | Disposition: A | Discharge status: Home / Self Care | Payer: Medicare Other | Source: Ambulatory Visit | Attending: Internal Medicine | Admitting: Internal Medicine

## 2012-09-15 ENCOUNTER — Other Ambulatory Visit (INDEPENDENT_AMBULATORY_CARE_PROVIDER_SITE_OTHER): Payer: Medicare Other

## 2012-09-15 VITALS — BP 110/72 | HR 53 | Temp 96.9°F | Resp 12 | Wt 212.1 lb

## 2012-09-15 DIAGNOSIS — M109 Gout, unspecified: Secondary | ICD-10-CM

## 2012-09-15 DIAGNOSIS — M79609 Pain in unspecified limb: Secondary | ICD-10-CM

## 2012-09-15 DIAGNOSIS — E785 Hyperlipidemia, unspecified: Secondary | ICD-10-CM | POA: Diagnosis not present

## 2012-09-15 DIAGNOSIS — I10 Essential (primary) hypertension: Secondary | ICD-10-CM | POA: Diagnosis not present

## 2012-09-15 DIAGNOSIS — M79622 Pain in left upper arm: Secondary | ICD-10-CM

## 2012-09-15 DIAGNOSIS — M5030 Other cervical disc degeneration, unspecified cervical region: Secondary | ICD-10-CM | POA: Diagnosis not present

## 2012-09-15 DIAGNOSIS — M47812 Spondylosis without myelopathy or radiculopathy, cervical region: Secondary | ICD-10-CM | POA: Diagnosis not present

## 2012-09-15 LAB — HEPATIC FUNCTION PANEL
ALT: 16 U/L (ref 0–53)
AST: 20 U/L (ref 0–37)
Albumin: 4 g/dL (ref 3.5–5.2)

## 2012-09-15 LAB — LIPID PANEL
HDL: 35.7 mg/dL — ABNORMAL LOW (ref >39.00)
Total CHOL/HDL Ratio: 3

## 2012-09-15 LAB — COMPREHENSIVE METABOLIC PANEL
ALT: 16 U/L (ref 0–53)
Albumin: 4 g/dL (ref 3.5–5.2)
Alkaline Phosphatase: 57 U/L (ref 39–117)
CO2: 31 mEq/L (ref 19–32)
GFR: 66.12 mL/min (ref >60.00)
Potassium: 4 mEq/L (ref 3.5–5.1)
Sodium: 137 mEq/L (ref 135–145)
Total Bilirubin: 0.9 mg/dL (ref 0.3–1.2)
Total Protein: 7.4 g/dL (ref 6.0–8.3)

## 2012-09-15 LAB — URIC ACID: Uric Acid, Serum: 6.1 mg/dL (ref 4.0–7.8)

## 2012-09-15 NOTE — Patient Instructions (Addendum)
Left arm pain with a normal exam. Concerned that this may be a problem of the cervical spine - a pinched nerve.  Plan X-rays of the neck today  For pain try tylenol 500  Mg up to six a day for pain  May take advil or motrin if you don't get relief with tylenol

## 2012-09-15 NOTE — Progress Notes (Signed)
Subjective:     Patient ID: Jacob Maduro., male   DOB: 1933-01-05, 78 y.o.   MRN: [id number]  Shoulder Injury   [name] is a pleasant 78 yo gentleman who presents with a 4 month history of left arm pain. Back in June, he was working with a chain saw on a fallen tree, and 6-8 weeks later, what feels like a muscle in his left arm (not shoulder) started hurting. He relates these two events: chain saw activity and pain onset 6-8 weeks later. He describes the pain as aching, not sharp, and reports that it hurts when he reaches for his billfold, reaches up, reaches for the seat belt, etc. The pain will wake him up if he is sleeping on that side and feels aching. Denies sharp pain. Has not taken anything over the counter for it. The pain has stayed about the same, no worse or better. The only thing that makes it somewhat better is a hot shower or heat pad. Not limiting mobility, ROM, daily activities. Started in July/Aug, been going on 3-4 months. Right-handed.   Worked for [employer], now retired. Never smoker.  Sister died of breast cancer. No other family history of cancer. Father died of MI, 16. Mother lived to 3.  Review of Systems +Night sweats, getting up to urinate ~1x/night +Cough (chronic) +Dry throat -Sore throat -Weight loss/gain -SOB, chest pain, fevers, chills -Numbness, tingling, changes in temperature sensation     Objective:   Physical Exam  Constitutional: He is oriented to person, place, and time and well-developed, well-nourished, and in no distress. No distress.  HENT:  Head: Normocephalic.  Neck: Normal range of motion. Neck supple.  Cardiovascular: Normal rate and regular rhythm.   Pulmonary/Chest: Effort normal and breath sounds normal.  Abdominal: Soft.  Musculoskeletal: He exhibits no edema and no tenderness.       5/5 strength bilateral upper and lower extremities Slightly decreased ROM in lifting up ULE overhead. Elicits some pain.  Lymphadenopathy:    He  has no cervical adenopathy.  Neurological: He is alert and oriented to person, place, and time.  Skin: Skin is warm.  Psychiatric: Mood, affect and judgment normal.    Physical Exam  Constitutional: He is oriented to person, place, and time and well-developed, well-nourished, and in no distress. No distress.  HENT:  Head: Normocephalic.  Neck: Normal range of motion. Neck supple.  Cardiovascular: Normal rate and regular rhythm.   Pulmonary/Chest: Effort normal and breath sounds normal.  Abdominal: Soft.  Musculoskeletal: He exhibits no edema and no tenderness.       5/5 strength bilateral upper and lower extremities Slightly decreased ROM in lifting up ULE overhead. Elicits some pain.  Lymphadenopathy:    He has no cervical adenopathy.  Neurological: He is alert and oriented to person, place, and time.  Skin: Skin is warm.  Psychiatric: Mood, affect and judgment normal.       Assessment:     Left upper arm pain    Plan:     See problem list.     Patient interviewed and examined personally. I agree with the assessment and plan. See problem list notes  M.Norins, MD

## 2012-09-16 NOTE — Assessment & Plan Note (Addendum)
Patient presents with 3-4 month history, which he relates to using a chain saw 6-8 weeks prior to pain onset. Aching pain, especially when lifts arm up or behind.   Plan: 1. C-spine series to assess for radiculopathy 2. Tylenol or NSAID as needed for pain 3. Continue heat pads as needed for pain  Addendum: C-spine series: CERVICAL SPINE - COMPLETE 4+ VIEW  Comparison: None.  Findings: Disc space narrowing most notable at C5-6 and C6-7.  Anatomic alignment. Neural foraminal narrowing most notable at C3-  4 on the right and C6-7 bilaterally.  Negative AP and odontoid views.  IMPRESSION:  Spondylosis as described.  For persistent pain or radicular symptoms (loss of strength or sensation) will need MRI.

## 2012-09-17 ENCOUNTER — Encounter: Payer: Self-pay | Admitting: Internal Medicine

## 2012-09-28 ENCOUNTER — Encounter: Payer: Self-pay | Admitting: Internal Medicine

## 2012-10-05 ENCOUNTER — Ambulatory Visit (INDEPENDENT_AMBULATORY_CARE_PROVIDER_SITE_OTHER): Payer: Medicare Other | Admitting: Internal Medicine

## 2012-10-05 ENCOUNTER — Encounter: Payer: Self-pay | Admitting: Internal Medicine

## 2012-10-05 VITALS — BP 140/74 | HR 57 | Ht 72.0 in | Wt 210.0 lb

## 2012-10-05 DIAGNOSIS — M7500 Adhesive capsulitis of unspecified shoulder: Secondary | ICD-10-CM | POA: Diagnosis not present

## 2012-10-05 DIAGNOSIS — I2510 Atherosclerotic heart disease of native coronary artery without angina pectoris: Secondary | ICD-10-CM

## 2012-10-05 NOTE — Progress Notes (Signed)
HPI Patient is a 78 yo with a history of CAD (s/p PROMUS stent to Cx 2009), PVCs, HTN and HL  I saw him back in May 2013  LIpid panel in November LDL was 53, HDL was 36.    SInce seen he remains active.  Denies CP  Breathing is OK Biggest complaint is L shoulder pain.  Hurt himself using chain saw. Also complains of great toe pain intermittently. Allergies  Allergen Reactions  . Niacin     Current Outpatient Prescriptions  Medication Sig Dispense Refill  . amLODipine (NORVASC) 2.5 MG tablet TAKE 1 TABLET DAILY  90 tablet  4  . aspirin 81 MG tablet Take 81 mg by mouth daily.        [name] atenolol (TENORMIN) 25 MG tablet Take 1 tablet (25 mg total) by mouth daily.  90 tablet  3  . CRESTOR 10 MG tablet TAKE 1 TABLET DAILY (PRIOR TO BEDTIME)  90 tablet  1  . nitroGLYCERIN (NITROSTAT) 0.4 MG SL tablet Place 1 tablet (0.4 mg total) under the tongue every 5 (five) minutes as needed.  25 tablet  11  . omeprazole (PRILOSEC) 20 MG capsule Take 20 mg by mouth once. 1 tab daily      . triamterene-hydrochlorothiazide (MAXZIDE-25) 37.5-25 MG per tablet Take 1 tablet by mouth daily.        Past Medical History  Diagnosis Date  . Coronary artery disease     stent (promus) Cx/OM'09  . Osteoarthritis     right ankle  . Recurrent skin cancer   . Prostatitis, acute   . Psoriasis   . Gout   . PVC (premature ventricular contraction)     hx  . Palpitations   . Hyperlipidemia   . Hypertension   . CAD 01/16/2009  . CHEST PAIN UNSPECIFIED 11/06/2009  . GOUT 10/13/2007  . HEARING LOSS 11/05/2010  . HYPERLIPIDEMIA 10/13/2007  . HYPERTENSION 10/13/2007  . OSTEOARTHRITIS, ANKLE, RIGHT 10/13/2007  . 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 PALPITATIONS, HX OF 10/13/2007  . PREMATURE VENTRICULAR CONTRACTIONS 10/13/2007  . PROSTATITIS, ACUTE, HX OF 10/13/2007  . PSORIASIS 10/13/2007  . SKIN CANCER, RECURRENT 10/13/2007    Past Surgical History  Procedure Date  . Vasectomy   . Tonsillectomy   .  Appendectomy   . Tonsillectomy   . Cyst on right shoulder   . Fracture right arm     Family History  Problem Relation Age of Onset  . Heart attack Father 25    died  . Heart attack  50    paternal uncle died/maternal aunt died  . Parkinsonism Mother 67    died  . Breast cancer Sister     died  . Lung cancer      uncle died    History   Social History  . Marital Status: Married    Spouse Name: N/A    Number of Children: 3  . Years of Education: N/A   Occupational History  . retired     from [employer].   Social History Main Topics  . Smoking status: Never Smoker   . Smokeless tobacco: Not on file  . Alcohol Use: Not on file  . Drug Use: Not on file  . Sexually Active: Not on file   Other Topics Concern  . 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  They are negative to the above problem except as previously stated.  Vital Signs: BP 140/74  P 57  Wt 210  Physical Exam Patient is in NAD HEENT:  Normocephalic, atraumatic. EOMI, PERRLA.  Neck: JVP is normal.  No bruits.  Lungs: clear to auscultation. No rales no wheezes.  Heart: Regular rate and rhythm. Normal S1, S2. No S3.   No significant murmurs. PMI not displaced.  Abdomen:  Supple, nontender. Normal bowel sounds. No masses. No hepatomegaly.  Extremities:   Good distal pulses throughout. No lower extremity edema.  Musculoskeletal :moving all extremities. Decreased ROM of L shoulder Neuro:   alert and oriented x3.  CN II-XII grossly intact.  SB 57 bpm.  Occasional PVC   Assessment and Plan:  1.  CAD  NO symptoms to suggest angina.  Keep on same regimen Stay active  2.  HL  Excellent control    3.  HTN  Continue meds.  Keep on regimen.  F/U in July.

## 2012-10-05 NOTE — Patient Instructions (Signed)
Your physician wants you to follow-up in:12 months You will receive a reminder letter in the mail two months in advance. If you don't receive a letter, please call our office to schedule the follow-up appointment.   Referral to Dr.Gramack or Dr. Melvyn Novas for "frozen shoulder"

## 2012-10-06 DIAGNOSIS — M67919 Unspecified disorder of synovium and tendon, unspecified shoulder: Secondary | ICD-10-CM | POA: Diagnosis not present

## 2012-10-06 DIAGNOSIS — M719 Bursopathy, unspecified: Secondary | ICD-10-CM | POA: Diagnosis not present

## 2012-10-27 ENCOUNTER — Ambulatory Visit (INDEPENDENT_AMBULATORY_CARE_PROVIDER_SITE_OTHER): Payer: Medicare Other | Admitting: Internal Medicine

## 2012-10-27 ENCOUNTER — Encounter: Payer: Self-pay | Admitting: Internal Medicine

## 2012-10-27 VITALS — BP 138/68 | HR 65 | Temp 97.6°F | Resp 10 | Ht 71.5 in | Wt 213.1 lb

## 2012-10-27 DIAGNOSIS — E785 Hyperlipidemia, unspecified: Secondary | ICD-10-CM

## 2012-10-27 DIAGNOSIS — I10 Essential (primary) hypertension: Secondary | ICD-10-CM | POA: Diagnosis not present

## 2012-10-27 DIAGNOSIS — M109 Gout, unspecified: Secondary | ICD-10-CM | POA: Diagnosis not present

## 2012-10-27 DIAGNOSIS — I2510 Atherosclerotic heart disease of native coronary artery without angina pectoris: Secondary | ICD-10-CM | POA: Diagnosis not present

## 2012-10-27 DIAGNOSIS — Z0000 Encounter for general adult medical examination without abnormal findings: Secondary | ICD-10-CM

## 2012-10-27 DIAGNOSIS — M79609 Pain in unspecified limb: Secondary | ICD-10-CM

## 2012-10-27 DIAGNOSIS — M79622 Pain in left upper arm: Secondary | ICD-10-CM

## 2012-10-27 NOTE — Assessment & Plan Note (Signed)
Dx as OA left shoulder. Can have steroid injection when needed.

## 2012-10-27 NOTE — Assessment & Plan Note (Signed)
No cardiac complaints. Last visit to Dr. Tenny Craw was Dec 10th - he is cardiac stable.

## 2012-10-27 NOTE — Assessment & Plan Note (Signed)
Last lipid panel Nov '13 LDL much lower than goal of 80. No adverse effects  Plan - continue present regimen.

## 2012-10-27 NOTE — Patient Instructions (Addendum)
Thanks for coming to see me.  The pain at the right chest wall seems benign. There is no palpable mass or lesion. It does not sound cardiac.  The left shoulder issue seems to be mostly arthritic. Steroid injections can be very helpful if the pain gets worse or there is limitation in range of motion.  You have a small scratch on the right scrotal sack. It does not appear to be infected. Plan - usual washing with soap and water.  Last labs in November were great.  Your are up to date with you immunizations except for Shingles- you should have this taken care of.  Have a Happy New Year - be healthy

## 2012-10-27 NOTE — Assessment & Plan Note (Signed)
No recent flares of gout

## 2012-10-27 NOTE — Assessment & Plan Note (Signed)
BP Readings from Last 3 Encounters:  10/27/12 138/68  10/05/12 140/74  09/15/12 110/72   Good control on present regimen. Last Bmet normal  Plan Continue present regimen

## 2012-10-27 NOTE — Assessment & Plan Note (Signed)
Interval history is negative for any major illness, surgery or injury. Physical exam is unremarkable. Previous lab Nov '13 was fine. He is current with colorectal cancer screening. Immunizations are up to date except shingles for which an Rx is provided.  In summary - a very nice man who is medically stable. He will return in  1 year or as needed.

## 2012-10-27 NOTE — Progress Notes (Signed)
Subjective:    Patient ID: [name]., male    DOB: [birth year], 78 y.o.   MRN: [id number]  HPI The patient is here for annual Medicare wellness examination and management of other chronic and acute problems.  [name] c/o frequent episodes of pain in the right anterior chest, like begin his with a fist. The discomfort will be gone by AM. Not exertional.  He has had left shoulder pain and had an ortho exam Dec 10th at [facility] ortho - no rotator cuff injury, diagnosed with DJD. Declined injection therapy.  He has had slight bleeding from the scrotum - question of inadvertant excoriation.   The risk factors are reflected in the social history.  The roster of all physicians providing medical care to patient - is listed in the Snapshot section of the chart.  Activities of daily living:  The patient is 100% inedpendent in all ADLs: dressing, toileting, feeding as well as independent mobility  Home safety : The patient has smoke detectors in the home. Falls - no falls. Home is not fall safe and he is advised to have a step rail to the garage and to have grab bar in the shower.They wear seatbelts.  No firearms. There is no violence in the home.   There is no risks for hepatitis, STDs or HIV. There is no  history of blood transfusion. They have no travel history to infectious disease endemic areas of the world.  The patient has seen their dentist in the last six month. They have  seen their eye doctor in the last year, actually goes every 2 months for backward growing eyelashes.. They deny any hearing difficulty and have not had audiologic testing in the last year.    They do not  have excessive sun exposure. Discussed the need for sun protection: hats, long sleeves and use of sunscreen if there is significant sun exposure.   Diet: the importance of a healthy diet is discussed. They do have a healthy diet.  The patient has a regular exercise program: walking , 30 min duration, 5 per  week.  The benefits of regular aerobic exercise were discussed.  Depression screen: there are no signs or vegative symptoms of depression- irritability, change in appetite, anhedonia, sadness/tearfullness.  Cognitive assessment: the patient manages all their financial and personal affairs and is actively engaged.   The following portions of the patient's history were reviewed and updated as appropriate: allergies, current medications, past family history, past medical history,  past surgical history, past social history  and problem list.  Past Medical History  Diagnosis Date  . Coronary artery disease     stent (promus) Cx/OM'09  . Osteoarthritis     right ankle  . Recurrent skin cancer   . Prostatitis, acute   . Psoriasis   . Gout   . PVC (premature ventricular contraction)     hx  . Palpitations   . Hyperlipidemia   . Hypertension   . CAD 01/16/2009  . CHEST PAIN UNSPECIFIED 11/06/2009  . GOUT 10/13/2007  . HEARING LOSS 11/05/2010  . HYPERLIPIDEMIA 10/13/2007  . HYPERTENSION 10/13/2007  . OSTEOARTHRITIS, ANKLE, RIGHT 10/13/2007  . 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 PALPITATIONS, HX OF 10/13/2007  . PREMATURE VENTRICULAR CONTRACTIONS 10/13/2007  . PROSTATITIS, ACUTE, HX OF 10/13/2007  . PSORIASIS 10/13/2007  . SKIN CANCER, RECURRENT 10/13/2007   Past Surgical History  Procedure Date  . Vasectomy   . Tonsillectomy   . Appendectomy   .  Tonsillectomy   . Cyst on right shoulder   . Fracture right arm    Family History  Problem Relation Age of Onset  . Heart attack Father 58    died  . Heart attack  50    paternal uncle died/maternal aunt died  . Parkinsonism Mother 60    died  . Breast cancer Sister     died  . Lung cancer      uncle died   History   Social History  . Marital Status: Married    Spouse Name: N/A    Number of Children: 3  . Years of Education: N/A   Occupational History  . retired     from [employer].   Social History Main Topics    . Smoking status: Never Smoker   . Smokeless tobacco: Not on file  . Alcohol Use: Not on file  . Drug Use: Not on file  . Sexually Active: Not on file   Other Topics Concern  . 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 amLODipine (NORVASC) 2.5 MG tablet TAKE 1 TABLET DAILY  90 tablet  4  . aspirin 81 MG tablet Take 81 mg by mouth daily.        [name] atenolol (TENORMIN) 25 MG tablet Take 1 tablet (25 mg total) by mouth daily.  90 tablet  3  . CRESTOR 10 MG tablet TAKE 1 TABLET DAILY (PRIOR TO BEDTIME)  90 tablet  1  . nitroGLYCERIN (NITROSTAT) 0.4 MG SL tablet Place 1 tablet (0.4 mg total) under the tongue every 5 (five) minutes as needed.  25 tablet  11  . omeprazole (PRILOSEC) 20 MG capsule Take 20 mg by mouth once. 1 tab daily      . triamterene-hydrochlorothiazide (MAXZIDE-25) 37.5-25 MG per tablet Take 1 tablet by mouth daily.         Vision, hearing, body mass index were assessed and reviewed.   During the course of the visit the patient was educated and counseled about appropriate screening and preventive services including : fall prevention , diabetes screening, nutrition counseling, colorectal cancer screening, and recommended immunizations.    Review of Systems Constitutional:  Negative for fever, chills, activity change and unexpected weight change. He will have an occasional night sweat. HEENT:  Negative for hearing loss, ear pain, congestion, neck stiffness and postnasal drip. Negative for sore throat or swallowing problems. Negative for dental complaints.   Eyes: Negative for vision loss or change in visual acuity.  Respiratory: Negative for chest tightness and wheezing.  Negative for DOE.   Cardiovascular: Negative for chest pain or palpitations. No decreased exercise tolerance Gastrointestinal: No change in bowel habit. No bloating or gas. No reflux or indigestion Genitourinary: Negative for urgency, frequency, flank pain and difficulty urinating. Nocturia 1-2 Musculoskeletal: Negative for myalgias, back pain, arthralgias and gait problem.  Neurological: Negative for dizziness, tremors, weakness and headaches.  Hematological: Negative for adenopathy.  Psychiatric/Behavioral: Negative for behavioral problems and dysphoric mood.       Objective:   Physical Exam Filed Vitals:   10/27/12 1330  BP: 138/68  Pulse: 65  Temp: 97.6 F (36.4 C)  Resp: 10   Wt  Readings from Last 3 Encounters:  10/27/12 213 lb 1.9 oz (96.671 kg)  10/05/12 210 lb (95.255 kg)  09/15/12 212 lb 1.3 oz (96.199 kg)   Gen'l: Well nourished well developed white male in no acute distress  HEENT: Head: Normocephalic and atraumatic. Right Ear: External ear normal. EAC/TM nl. Left Ear: External ear normal.  EAC/TM nl. Nose: Nose normal. Mouth/Throat: Oropharynx is clear and moist. Dentition - native, in good repair. Partial is in good repair. No buccal or palatal lesions. Posterior pharynx clear. Eyes: Conjunctivae and sclera clear. EOM intact. Pupils are equal, round, and reactive to light. Right eye exhibits no discharge. Left eye exhibits no discharge. Neck: Normal range of motion. Neck supple. No JVD present. No tracheal deviation present. No thyromegaly present.  Cardiovascular: Normal rate, regular rhythm, no gallop, no friction rub, no murmur heard.      Quiet precordium. 2+ radial and DP pulses . No carotid bruits Pulmonary/Chest: Effort normal. No respiratory distress or increased WOB, no wheezes, no rales. No chest wall deformity or CVAT. Abdomen: Soft. Bowel sounds are normal in all quadrants. He exhibits no distension, no tenderness, no rebound or guarding, No heptosplenomegaly    Genitourinary:  small excoriation right scrotum, w/o signs of infection. Uncircumcised. Musculoskeletal: Normal range of motion. He exhibits no edema and no tenderness.       Small and large joints without redness, synovial thickening or deformity. Full range of motion preserved about all small, median and large joints.  Lymphadenopathy:    He has no cervical or supraclavicular adenopathy.  Neurological: He is alert and oriented to person, place, and time. CN II-XII intact. DTRs 2+ and symmetrical biceps, radial and patellar tendons. Cerebellar function normal with no tremor, rigidity, normal gait and station.  Skin: Skin is warm and dry. No rash noted. No erythema.  Psychiatric: He has a normal mood and affect. His behavior is normal. Thought content normal.   Lab Results  Component Value Date   WBC 5.7 10/28/2011   HGB 16.1 10/28/2011   HCT 47.1 10/28/2011   PLT 199.0 10/28/2011   GLUCOSE 105* 09/15/2012   CHOL 114 09/15/2012   TRIG 128.0 09/15/2012   HDL 35.70* 09/15/2012   LDLDIRECT 128.0 11/16/2007   LDLCALC 53 09/15/2012   ALT 16 09/15/2012   ALT 16 09/15/2012   AST 20 09/15/2012   AST 20 09/15/2012   NA 137 09/15/2012   K 4.0 09/15/2012   CL 100 09/15/2012   CREATININE 1.1 09/15/2012   BUN 19 09/15/2012   CO2 31 09/15/2012   PSA 0.33 10/28/2011         Assessment & Plan:

## 2012-10-28 ENCOUNTER — Other Ambulatory Visit: Payer: Self-pay | Admitting: Internal Medicine

## 2012-11-05 DIAGNOSIS — H02059 Trichiasis without entropian unspecified eye, unspecified eyelid: Secondary | ICD-10-CM | POA: Diagnosis not present

## 2012-11-18 ENCOUNTER — Other Ambulatory Visit: Payer: Self-pay | Admitting: Internal Medicine

## 2012-11-22 ENCOUNTER — Other Ambulatory Visit: Payer: Self-pay | Admitting: Internal Medicine

## 2012-11-23 ENCOUNTER — Other Ambulatory Visit: Payer: Self-pay | Admitting: *Deleted

## 2012-11-23 DIAGNOSIS — M79622 Pain in left upper arm: Secondary | ICD-10-CM

## 2012-11-23 MED ORDER — OMEPRAZOLE 20 MG PO CPDR: 20.0000 mg | DELAYED_RELEASE_CAPSULE | Freq: Once | ORAL | Status: DC

## 2012-11-28 ENCOUNTER — Other Ambulatory Visit: Payer: Self-pay | Admitting: Internal Medicine

## 2012-12-02 ENCOUNTER — Other Ambulatory Visit: Payer: Self-pay | Admitting: *Deleted

## 2012-12-02 DIAGNOSIS — M79622 Pain in left upper arm: Secondary | ICD-10-CM

## 2012-12-02 MED ORDER — OMEPRAZOLE 20 MG PO CPDR: 20.0000 mg | DELAYED_RELEASE_CAPSULE | Freq: Once | ORAL | Status: DC

## 2012-12-17 DIAGNOSIS — H02059 Trichiasis without entropian unspecified eye, unspecified eyelid: Secondary | ICD-10-CM | POA: Diagnosis not present

## 2013-01-14 DIAGNOSIS — H02059 Trichiasis without entropian unspecified eye, unspecified eyelid: Secondary | ICD-10-CM | POA: Diagnosis not present

## 2013-02-02 ENCOUNTER — Encounter: Payer: Self-pay | Admitting: Gastroenterology

## 2013-02-13 ENCOUNTER — Other Ambulatory Visit: Payer: Self-pay | Admitting: Internal Medicine

## 2013-02-25 DIAGNOSIS — L821 Other seborrheic keratosis: Secondary | ICD-10-CM | POA: Diagnosis not present

## 2013-02-25 DIAGNOSIS — L570 Actinic keratosis: Secondary | ICD-10-CM | POA: Diagnosis not present

## 2013-02-25 DIAGNOSIS — Z85828 Personal history of other malignant neoplasm of skin: Secondary | ICD-10-CM | POA: Diagnosis not present

## 2013-03-10 DIAGNOSIS — H02059 Trichiasis without entropian unspecified eye, unspecified eyelid: Secondary | ICD-10-CM | POA: Diagnosis not present

## 2013-04-27 ENCOUNTER — Ambulatory Visit: Payer: Medicare Other | Admitting: Internal Medicine

## 2013-05-04 DIAGNOSIS — H02059 Trichiasis without entropian unspecified eye, unspecified eyelid: Secondary | ICD-10-CM | POA: Diagnosis not present

## 2013-05-13 ENCOUNTER — Encounter: Payer: Self-pay | Admitting: Internal Medicine

## 2013-05-13 ENCOUNTER — Ambulatory Visit (INDEPENDENT_AMBULATORY_CARE_PROVIDER_SITE_OTHER)
Admission: RE | Admit: 2013-05-13 | Discharge: 2013-05-13 | Disposition: A | Discharge status: Home / Self Care | Payer: Medicare Other | Source: Ambulatory Visit | Attending: Internal Medicine | Admitting: Internal Medicine

## 2013-05-13 ENCOUNTER — Ambulatory Visit (INDEPENDENT_AMBULATORY_CARE_PROVIDER_SITE_OTHER): Payer: Medicare Other | Admitting: Internal Medicine

## 2013-05-13 VITALS — BP 122/70 | HR 51 | Ht 72.0 in | Wt 213.0 lb

## 2013-05-13 DIAGNOSIS — R0989 Other specified symptoms and signs involving the circulatory and respiratory systems: Secondary | ICD-10-CM | POA: Diagnosis not present

## 2013-05-13 DIAGNOSIS — I2510 Atherosclerotic heart disease of native coronary artery without angina pectoris: Secondary | ICD-10-CM | POA: Diagnosis not present

## 2013-05-13 NOTE — Progress Notes (Signed)
HPIPatient is a 79 yo with a history of CAD (s/p PROMUS stent to Cx 2009), PVCs, HTN and HL I saw him back in December.  LIpid panel in November LDL was 53, HDL was 36.   Patinet denies CP  No SOB  No dizziness  No PND.  Walks 5x per week. Patient complains of night sweats  NOt full body sweat  Not drenching  Just head.   Appetite good  Wt stable.    Allergies  Allergen Reactions  . Niacin     Current Outpatient Prescriptions  Medication Sig Dispense Refill  . amLODipine (NORVASC) 2.5 MG tablet TAKE 1 TABLET DAILY  90 tablet  3  . aspirin 81 MG tablet Take 81 mg by mouth daily.        [name] atenolol (TENORMIN) 25 MG tablet Take 1 tablet (25 mg total) by mouth daily.  90 tablet  3  . CRESTOR 10 MG tablet TAKE 1 TABLET DAILY PRIOR TO BEDTIME  90 tablet  1  . nitroGLYCERIN (NITROSTAT) 0.4 MG SL tablet Place 1 tablet (0.4 mg total) under the tongue every 5 (five) minutes as needed.  25 tablet  11  . omeprazole (PRILOSEC) 20 MG capsule Take 1 capsule (20 mg total) by mouth once. 1 tab daily  90 capsule  3  . triamterene-hydrochlorothiazide (MAXZIDE-25) 37.5-25 MG per tablet Take 1 tablet by mouth daily.      [name] triamterene-hydrochlorothiazide (MAXZIDE-25) 37.5-25 MG per tablet TAKE ONE TABLET BY MOUTH EVERY DAY  90 tablet  3   No current facility-administered medications for this visit.    Past Medical History  Diagnosis Date  . Coronary artery disease     stent (promus) Cx/OM'09  . Osteoarthritis     right ankle  . Recurrent skin cancer   . Prostatitis, acute   . Psoriasis   . Gout   . PVC (premature ventricular contraction)     hx  . Palpitations   . Hyperlipidemia   . Hypertension   . CAD 01/16/2009  . CHEST PAIN UNSPECIFIED 11/06/2009  . GOUT 10/13/2007  . HEARING LOSS 11/05/2010  . HYPERLIPIDEMIA 10/13/2007  . HYPERTENSION 10/13/2007  . OSTEOARTHRITIS, ANKLE, RIGHT 10/13/2007  . 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 PALPITATIONS, HX OF 10/13/2007  . PREMATURE  VENTRICULAR CONTRACTIONS 10/13/2007  . PROSTATITIS, ACUTE, HX OF 10/13/2007  . PSORIASIS 10/13/2007  . SKIN CANCER, RECURRENT 10/13/2007    Past Surgical History  Procedure Laterality Date  . Vasectomy    . Tonsillectomy    . Appendectomy    . Tonsillectomy    . Cyst on right shoulder    . Fracture right arm      Family History  Problem Relation Age of Onset  . Heart attack Father 70    died  . Heart attack  50    paternal uncle died/maternal aunt died  . Parkinsonism Mother 17    died  . Breast cancer Sister     died  . Lung cancer      uncle died    History   Social History  . Marital Status: Married    Spouse Name: N/A    Number of Children: 3  . Years of Education: N/A   Occupational History  . retired     from [employer].   Social History Main Topics  . Smoking status: Never Smoker   . Smokeless tobacco: Not on file  . Alcohol Use: Not on file  .  Drug Use: Not on file  . Sexually Active: Not on file   Other Topics Concern  . 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  They are negative to the above problem except as previously stated.  Vital Signs: BP 122/70  Pulse 51  Ht 6' (1.829 m)  Wt 213 lb (96.616 kg)  BMI 28.88 kg/m2  Physical Exam Patient is in NAD HEENT:  Normocephalic, atraumatic. EOMI, PERRLA.  Neck: JVP is normal.  No bruits.  Lungs: clear to auscultation. Basilar rales. Heart: Regular rate and rhythm. Normal S1, S2. No S3.   No significant murmurs. PMI not displaced.  Abdomen:  Supple, nontender. Normal bowel sounds. No masses. No hepatomegaly.  Extremities:   Good distal pulses throughout. No lower extremity edema.  Musculoskeletal :moving all extremities.  Neuro:    alert and oriented x3.  CN II-XII grossly intact.  EKG:  SB 57  LBBB Assessment and Plan:  1.  CAD  No symptoms of angina  2.  HL  Good control  3.  HTN  Good control  4.  Pulm  Volume status looks good.  Get chest xray  Encouraged patinet to stay active.

## 2013-05-13 NOTE — Patient Instructions (Addendum)
A chest x-ray takes a picture of the organs and structures inside the chest, including the heart, lungs, and blood vessels.  This test can show several things, including, whether the heart is enlarges; whether fluid is building up in the lungs; and whether pacemaker / defibrillator leads are still in place.   No changes were made to your medications today.   Your physician wants you to follow-up in: 9 months with Dr. Tenny Craw.  You will receive a reminder letter in the mail two months in advance. If you don't receive a letter, please call our office to schedule the follow-up appointment.

## 2013-05-18 DIAGNOSIS — H2510 Age-related nuclear cataract, unspecified eye: Secondary | ICD-10-CM | POA: Diagnosis not present

## 2013-05-18 DIAGNOSIS — H02059 Trichiasis without entropian unspecified eye, unspecified eyelid: Secondary | ICD-10-CM | POA: Diagnosis not present

## 2013-05-20 ENCOUNTER — Other Ambulatory Visit: Payer: Self-pay | Admitting: Internal Medicine

## 2013-07-12 DIAGNOSIS — H02059 Trichiasis without entropian unspecified eye, unspecified eyelid: Secondary | ICD-10-CM | POA: Diagnosis not present

## 2013-07-21 ENCOUNTER — Ambulatory Visit (INDEPENDENT_AMBULATORY_CARE_PROVIDER_SITE_OTHER): Payer: Medicare Other

## 2013-07-21 DIAGNOSIS — Z23 Encounter for immunization: Secondary | ICD-10-CM

## 2013-07-26 ENCOUNTER — Other Ambulatory Visit: Payer: Self-pay | Admitting: Internal Medicine

## 2013-08-26 DIAGNOSIS — L570 Actinic keratosis: Secondary | ICD-10-CM | POA: Diagnosis not present

## 2013-08-26 DIAGNOSIS — D239 Other benign neoplasm of skin, unspecified: Secondary | ICD-10-CM | POA: Diagnosis not present

## 2013-08-26 DIAGNOSIS — Z85828 Personal history of other malignant neoplasm of skin: Secondary | ICD-10-CM | POA: Diagnosis not present

## 2013-08-26 DIAGNOSIS — L819 Disorder of pigmentation, unspecified: Secondary | ICD-10-CM | POA: Diagnosis not present

## 2013-08-26 DIAGNOSIS — L821 Other seborrheic keratosis: Secondary | ICD-10-CM | POA: Diagnosis not present

## 2013-09-06 DIAGNOSIS — H02059 Trichiasis without entropian unspecified eye, unspecified eyelid: Secondary | ICD-10-CM | POA: Diagnosis not present

## 2013-09-28 ENCOUNTER — Telehealth: Payer: Self-pay

## 2013-09-28 NOTE — Telephone Encounter (Signed)
Phone call to patient and left a message letting him know he is due for a Prevnar vaccine.

## 2013-10-13 DIAGNOSIS — H02059 Trichiasis without entropian unspecified eye, unspecified eyelid: Secondary | ICD-10-CM | POA: Diagnosis not present

## 2013-11-02 ENCOUNTER — Encounter: Payer: Self-pay | Admitting: Internal Medicine

## 2013-11-02 ENCOUNTER — Other Ambulatory Visit (INDEPENDENT_AMBULATORY_CARE_PROVIDER_SITE_OTHER): Payer: Medicare Other

## 2013-11-02 ENCOUNTER — Telehealth: Payer: Self-pay | Admitting: Internal Medicine

## 2013-11-02 ENCOUNTER — Ambulatory Visit (INDEPENDENT_AMBULATORY_CARE_PROVIDER_SITE_OTHER): Payer: Medicare Other | Admitting: Internal Medicine

## 2013-11-02 VITALS — BP 136/80 | HR 54 | Temp 97.0°F | Ht 72.0 in | Wt 214.8 lb

## 2013-11-02 DIAGNOSIS — M109 Gout, unspecified: Secondary | ICD-10-CM

## 2013-11-02 DIAGNOSIS — Z23 Encounter for immunization: Secondary | ICD-10-CM | POA: Diagnosis not present

## 2013-11-02 DIAGNOSIS — E785 Hyperlipidemia, unspecified: Secondary | ICD-10-CM

## 2013-11-02 DIAGNOSIS — H9190 Unspecified hearing loss, unspecified ear: Secondary | ICD-10-CM

## 2013-11-02 DIAGNOSIS — I2510 Atherosclerotic heart disease of native coronary artery without angina pectoris: Secondary | ICD-10-CM

## 2013-11-02 DIAGNOSIS — I10 Essential (primary) hypertension: Secondary | ICD-10-CM

## 2013-11-02 DIAGNOSIS — Z0000 Encounter for general adult medical examination without abnormal findings: Secondary | ICD-10-CM

## 2013-11-02 DIAGNOSIS — Z7189 Other specified counseling: Secondary | ICD-10-CM | POA: Insufficient documentation

## 2013-11-02 LAB — BASIC METABOLIC PANEL
BUN: 16 mg/dL (ref 6–23)
CALCIUM: 9 mg/dL (ref 8.4–10.5)
CHLORIDE: 103 meq/L (ref 96–112)
CO2: 28 meq/L (ref 19–32)
CREATININE: 1.1 mg/dL (ref 0.4–1.5)
GFR: 67.98 mL/min (ref >60.00)
GLUCOSE: 100 mg/dL — AB (ref 70–99)
Potassium: 4 mEq/L (ref 3.5–5.1)
Sodium: 139 mEq/L (ref 135–145)

## 2013-11-02 LAB — HEPATIC FUNCTION PANEL
ALK PHOS: 58 U/L (ref 39–117)
ALT: 15 U/L (ref 0–53)
AST: 20 U/L (ref 0–37)
Albumin: 4.1 g/dL (ref 3.5–5.2)
BILIRUBIN TOTAL: 0.9 mg/dL (ref 0.3–1.2)
Bilirubin, Direct: 0.2 mg/dL (ref 0.0–0.3)
Total Protein: 7.3 g/dL (ref 6.0–8.3)

## 2013-11-02 LAB — LIPID PANEL
CHOL/HDL RATIO: 3
Cholesterol: 99 mg/dL (ref 0–200)
HDL: 34 mg/dL — AB (ref >39.00)
LDL CALC: 47 mg/dL (ref 0–99)
Triglycerides: 91 mg/dL (ref 0.0–149.0)
VLDL: 18.2 mg/dL (ref 0.0–40.0)

## 2013-11-02 LAB — URIC ACID: URIC ACID, SERUM: 6.4 mg/dL (ref 4.0–7.8)

## 2013-11-02 NOTE — Progress Notes (Signed)
Subjective:    Patient ID: [name]., male    DOB: [birth year], 80 y.o.   MRN: [id number]  HPI The patient is here for annual Medicare wellness examination and management of other chronic and acute problems.  Rash - perianal, itches, no pain. Tried Gold bond w/ Aloe. Present for 3 weeks.    The risk factors are reflected in the social history.  The roster of all physicians providing medical care to patient - is listed in the Snapshot section of the chart.  Activities of daily living:  The patient is 100% inedpendent in all ADLs: dressing, toileting, feeding as well as independent mobility  Home safety : The patient has smoke detectors in the home. They wear seatbelts.No firearms at home ( firearms are present in the home, kept in a safe fashion). There is no violence in the home.   There is no risks for hepatitis, STDs or HIV. There is no history of blood transfusion. They have no travel history to infectious disease endemic areas of the world.  The patient has seen their dentist in the last six month. They have seen their eye doctor in the last year. They deny any hearing difficulty and have not had audiologic testing in the last year.    They do not  have excessive sun exposure. Discussed the need for sun protection: hats, long sleeves and use of sunscreen if there is significant sun exposure.   Diet: the importance of a healthy diet is discussed. They do have a healthy diet.  The patient has a regular exercise program: walking , 30 min duration, 3-5 per week.  The benefits of regular aerobic exercise were discussed.  Depression screen: there are no signs or vegative symptoms of depression- irritability, change in appetite, anhedonia, sadness/tearfullness.  Cognitive assessment: the patient manages all their financial and personal affairs and is actively engaged. They could relate day,date,year and events; recalled 3/3 objects at 3 minutes.  The following portions of  the patient's history were reviewed and updated as appropriate: allergies, current medications, past family history, past medical history,  past surgical history, past social history  and problem list.  Vision, hearing, body mass index were assessed and reviewed.   During the course of the visit the patient was educated and counseled about appropriate screening and preventive services including : fall prevention , diabetes screening, nutrition counseling, colorectal cancer screening, and recommended immunizations.  Past Medical History  Diagnosis Date  . Coronary artery disease     stent (promus) Cx/OM'09  . Osteoarthritis     right ankle  . Recurrent skin cancer   . Prostatitis, acute   . Psoriasis   . Gout   . PVC (premature ventricular contraction)     hx  . Palpitations   . Hyperlipidemia   . Hypertension   . CAD 01/16/2009  . CHEST PAIN UNSPECIFIED 11/06/2009  . GOUT 10/13/2007  . HEARING LOSS 11/05/2010  . HYPERLIPIDEMIA 10/13/2007  . HYPERTENSION 10/13/2007  . OSTEOARTHRITIS, ANKLE, RIGHT 10/13/2007  . 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 PALPITATIONS, HX OF 10/13/2007  . PREMATURE VENTRICULAR CONTRACTIONS 10/13/2007  . PROSTATITIS, ACUTE, HX OF 10/13/2007  . PSORIASIS 10/13/2007  . SKIN CANCER, RECURRENT 10/13/2007   Past Surgical History  Procedure Laterality Date  . Vasectomy    . Tonsillectomy    . Appendectomy    . Tonsillectomy    . Cyst on right shoulder    . Fracture right arm  Family History  Problem Relation Age of Onset  . Heart attack Father 31    died  . Heart attack  50    paternal uncle died/maternal aunt died  . Parkinsonism Mother 41    died  . Breast cancer Sister     died  . Lung cancer      uncle died   History   Social History  . Marital Status: Married    Spouse Name: N/A    Number of Children: 3  . Years of Education: N/A   Occupational History  . retired     from [employer].   Social History Main Topics  . Smoking  status: Never Smoker   . Smokeless tobacco: Not on file  . Alcohol Use: No  . Drug Use: No  . Sexual Activity: Not on file   Other Topics Concern  . 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  HEENT:  Negative for hearing loss, ear pain, congestion, neck stiffness and postnasal drip. Negative for sore throat or swallowing problems. Negative for dental complaints.   Eyes: Negative for vision loss or change in visual acuity.  Respiratory: Negative for chest tightness and wheezing. Negative for DOE.   Cardiovascular: Negative for chest pain or palpitations. No decreased exercise tolerance Gastrointestinal: No change in bowel habit. No bloating or gas. No reflux or indigestion Genitourinary: Negative for urgency, frequency, flank pain and difficulty urinating.  Musculoskeletal: Negative for myalgias, back pain, arthralgias and gait problem.  Neurological: Negative for dizziness, tremors, weakness and headaches.  Hematological: Negative for adenopathy.  Psychiatric/Behavioral: Negative for behavioral problems and dysphoric mood.       Objective:   Physical Exam Filed Vitals:   11/02/13 0850  BP: 136/80  Pulse: 54  Temp: 97 F (36.1 C)   Wt Readings from Last 3 Encounters:  11/02/13 214 lb 12.8 oz (97.433 kg)  05/13/13 213 lb (96.616 kg)  10/27/12 213 lb 1.9 oz (96.671 kg)   Gen'l: Well nourished well developed male in no acute distress  HEENT: Head: Normocephalic and atraumatic. Right Ear: External ear normal. EAC/TM nl. Left Ear: External ear normal.  EAC/TM nl. Nose: Nose normal. Mouth/Throat: Oropharynx is clear and moist. Dentition - native,  in good repair. No buccal or palatal lesions. Posterior pharynx clear. Eyes: Conjunctivae and sclera clear. EOM intact. Pupils are equal, round, and reactive to light. Right eye exhibits no discharge. Left eye exhibits no discharge. Neck: Normal range of motion. Neck supple. No JVD present. No tracheal deviation present. No thyromegaly present.  Cardiovascular: Normal rate, regular rhythm, no gallop, no friction rub, no murmur heard.      Quiet precordium. 2+ radial and DP pulses . No carotid bruits Pulmonary/Chest: Effort normal. No respiratory distress or increased WOB, no wheezes, no rales. No chest wall deformity or CVAT. Abdomen: Soft. Bowel sounds are normal in all quadrants. He exhibits no distension, no tenderness, no rebound or guarding, No heptosplenomegaly  Genitourinary:   Musculoskeletal: Normal range of motion. He exhibits no edema and no tenderness.       Small and large joints  without redness, synovial thickening or deformity. Full range of motion preserved about all small, median and large joints.  Lymphadenopathy:    He has no cervical or supraclavicular adenopathy.  Neurological: He is alert and oriented to person, place, and time. CN II-XII intact. DTRs 2+ and symmetrical biceps, radial and patellar tendons. Cerebellar function normal with no tremor, rigidity, normal gait and station.  Skin: Skin is warm and dry. No rash noted. No erythema. erythematous rash with a demarcating ring in the perianal region. No open lesions Psychiatric: He has a normal mood and affect. His behavior is normal. Thought content normal.         Assessment & Plan:

## 2013-11-02 NOTE — Assessment & Plan Note (Signed)
Stable with no complaints. Current with Dr. Harrington Challenger.

## 2013-11-02 NOTE — Assessment & Plan Note (Signed)
Has been well controlled.  Plan Follow up lab with recommendations to follow

## 2013-11-02 NOTE — Assessment & Plan Note (Signed)
BP Readings from Last 3 Encounters:  11/02/13 136/80  05/13/13 122/70  10/27/12 138/68   Good control on present medications  Plan  follow up [facility]

## 2013-11-02 NOTE — Assessment & Plan Note (Signed)
Denied any hearing loss at today's visit. No formal testing done.

## 2013-11-02 NOTE — Assessment & Plan Note (Signed)
Discussed with patient: he states he would not want out of facility CPR - provided "Out of Facility" and he is referred to [email or web address]. (Jan '15)

## 2013-11-02 NOTE — Assessment & Plan Note (Signed)
Invterval h/o: no major illness, surgery or injury. Physical exam OK. Labs pending. He is overdue for follow up colonoscopy - but declines to be scheduled. Aged out of prostate cancer screening. Current with immunizations - given Prevnar today. Addressed ACP today - see problem list.  In summary -  A nice man who appears to be medically stable.

## 2013-11-02 NOTE — Progress Notes (Signed)
Pre visit review using our clinic review tool, if applicable. No additional management support is needed unless otherwise documented below in the visit note. 

## 2013-11-02 NOTE — Patient Instructions (Signed)
Thanks for coming to see mm.  YOu seem to be doing well. Your exam was normal except for the rash.  Rash - looks like a fungal infection. Plan - diflucan 100 mg once a day for 10 days.  Immunizations - Prevnar today to bring pneumonia immunization up to date. Please get the Zostavax for shingles.  Health maintenance - I do recommend a repeat colonoscopy but I hear you and not getting a study is a moderately low risk.  Advanced Care Planning - providing you an out of facility order. Please check the website [email or web address]  We will be sure to fix you up with a good care provider, perhaps at the [place] office.

## 2013-11-02 NOTE — Telephone Encounter (Signed)
A user error has taken place.

## 2013-11-07 ENCOUNTER — Encounter: Payer: Self-pay | Admitting: Internal Medicine

## 2013-11-09 ENCOUNTER — Telehealth: Payer: Self-pay | Admitting: Internal Medicine

## 2013-11-09 MED ORDER — FLUCONAZOLE 100 MG PO TABS: 100.0000 mg | ORAL_TABLET | Freq: Every day | ORAL | Status: DC

## 2013-11-09 NOTE — Telephone Encounter (Signed)
Patient called stating he was told on 11/02/13 that a rx would be called in for a rash Stated he need medication -please advise

## 2013-11-09 NOTE — Telephone Encounter (Signed)
Per 11/02/13 office note Diflucan is to be sent to his pharmacy. This was done.

## 2013-11-12 ENCOUNTER — Other Ambulatory Visit: Payer: Self-pay | Admitting: Internal Medicine

## 2013-11-15 ENCOUNTER — Other Ambulatory Visit: Payer: Self-pay | Admitting: Internal Medicine

## 2013-11-23 ENCOUNTER — Other Ambulatory Visit: Payer: Self-pay | Admitting: Internal Medicine

## 2013-12-02 DIAGNOSIS — H02059 Trichiasis without entropian unspecified eye, unspecified eyelid: Secondary | ICD-10-CM | POA: Diagnosis not present

## 2013-12-06 ENCOUNTER — Encounter: Payer: Self-pay | Admitting: Internal Medicine

## 2013-12-06 ENCOUNTER — Ambulatory Visit (INDEPENDENT_AMBULATORY_CARE_PROVIDER_SITE_OTHER): Payer: Medicare Other | Admitting: Internal Medicine

## 2013-12-06 VITALS — BP 116/80 | HR 62 | Temp 97.0°F | Wt 219.0 lb

## 2013-12-06 DIAGNOSIS — B029 Zoster without complications: Secondary | ICD-10-CM

## 2013-12-06 MED ORDER — PREDNISONE 20 MG PO TABS: 20.0000 mg | ORAL_TABLET | Freq: Every day | ORAL | Status: DC

## 2013-12-06 MED ORDER — VALACYCLOVIR HCL 1 G PO TABS: 1000.0000 mg | ORAL_TABLET | Freq: Two times a day (BID) | ORAL | Status: DC

## 2013-12-06 MED ORDER — TRAMADOL HCL 50 MG PO TABS: 100.0000 mg | ORAL_TABLET | Freq: Three times a day (TID) | ORAL | Status: DC | PRN

## 2013-12-06 NOTE — Patient Instructions (Signed)
Shingles - delayed diagnosis but according to UpToDate it is reasonable to prescribe treatment as long as there are new lesions erupting, as there are in your case.  Plan Valtrex 1,000 mg three times a day for 7 days ` Prednisone 20 mg once a day for 10 days.  For pain - written prescription for Tramadol 50mg  take 2 every 8 hours as needed for pain.  [place]- over the counter. Make a poultice and apply to the rash as needed for comfort.    Shingles Shingles (herpes zoster) is an infection that is caused by the same virus that causes chickenpox (varicella). The infection causes a painful skin rash and fluid-filled blisters, which eventually break open, crust over, and heal. It may occur in any area of the body, but it usually affects only one side of the body or face. The pain of shingles usually lasts about 1 month. However, some people with shingles may develop long-term (chronic) pain in the affected area of the body. Shingles often occurs many years after the person had chickenpox. It is more common:  In people older than 50 years.  In people with weakened immune systems, such as those with HIV, AIDS, or cancer.  In people taking medicines that weaken the immune system, such as transplant medicines.  In people under great stress. CAUSES  Shingles is caused by the varicella zoster virus (VZV), which also causes chickenpox. After a person is infected with the virus, it can remain in the person's body for years in an inactive state (dormant). To cause shingles, the virus reactivates and breaks out as an infection in a nerve root. The virus can be spread from person to person (contagious) through contact with open blisters of the shingles rash. It will only spread to people who have not had chickenpox. When these people are exposed to the virus, they may develop chickenpox. They will not develop shingles. Once the blisters scab over, the person is no longer contagious and cannot spread the virus  to others. SYMPTOMS  Shingles shows up in stages. The initial symptoms may be pain, itching, and tingling in an area of the skin. This pain is usually described as burning, stabbing, or throbbing.In a few days or weeks, a painful red rash will appear in the area where the pain, itching, and tingling were felt. The rash is usually on one side of the body in a band or belt-like pattern. Then, the rash usually turns into fluid-filled blisters. They will scab over and dry up in approximately 2 3 weeks. Flu-like symptoms may also occur with the initial symptoms, the rash, or the blisters. These may include:  Fever.  Chills.  Headache.  Upset stomach. DIAGNOSIS  Your caregiver will perform a skin exam to diagnose shingles. Skin scrapings or fluid samples may also be taken from the blisters. This sample will be examined under a microscope or sent to a lab for further testing. TREATMENT  There is no specific cure for shingles. Your caregiver will likely prescribe medicines to help you manage the pain, recover faster, and avoid long-term problems. This may include antiviral drugs, anti-inflammatory drugs, and pain medicines. HOME CARE INSTRUCTIONS   Take a cool bath or apply cool compresses to the area of the rash or blisters as directed. This may help with the pain and itching.   Only take over-the-counter or prescription medicines as directed by your caregiver.   Rest as directed by your caregiver.  Keep your rash and blisters clean with mild  soap and cool water or as directed by your caregiver.  Do not pick your blisters or scratch your rash. Apply an anti-itch cream or numbing creams to the affected area as directed by your caregiver.  Keep your shingles rash covered with a loose bandage (dressing).  Avoid skin contact with:  Babies.   Pregnant women.   Children with eczema.   Elderly people with transplants.   People with chronic illnesses, such as leukemia or AIDS.    Wear loose-fitting clothing to help ease the pain of material rubbing against the rash.  Keep all follow-up appointments with your caregiver.If the area involved is on your face, you may receive a referral for follow-up to a specialist, such as an eye doctor (ophthalmologist) or an ear, nose, and throat (ENT) doctor. Keeping all follow-up appointments will help you avoid eye complications, chronic pain, or disability.  SEEK IMMEDIATE MEDICAL CARE IF:   You have facial pain, pain around the eye area, or loss of feeling on one side of your face.  You have ear pain or ringing in your ear.  You have loss of taste.  Your pain is not relieved with prescribed medicines.   Your redness or swelling spreads.   You have more pain and swelling.  Your condition is worsening or has changed.   You have a feveror persistent symptoms for more than 2 3 days.  You have a fever and your symptoms suddenly get worse. MAKE SURE YOU:  Understand these instructions.  Will watch your condition.  Will get help right away if you are not doing well or get worse. Document Released: 10/14/2005 Document Revised: 07/08/2012 Document Reviewed: 05/28/2012 [facility] Patient Information 2014 [place].

## 2013-12-06 NOTE — Progress Notes (Signed)
Pre visit review using our clinic review tool, if applicable. No additional management support is needed unless otherwise documented below in the visit note. 

## 2013-12-07 NOTE — Progress Notes (Signed)
   Subjective:    Patient ID: [name]., male    DOB: 1933/05/02, 80 y.o.   MRN: [id number]  HPI Mr. Bartell had the outbreak of a red, erythematous, vesicular rash in a left lumbar dermatome that first erupted 7 days ago. He continues to have new lesions erupt now to the midline of the abdomen. He has some discomfort but no severe pain.  PMH, FamHx and SocHx reviewed for any changes and relevance.  Current Outpatient Prescriptions on File Prior to Visit  Medication Sig Dispense Refill  . amLODipine (NORVASC) 2.5 MG tablet TAKE 1 TABLET DAILY  90 tablet  0  . aspirin 81 MG tablet Take 81 mg by mouth daily.        [name] atenolol (TENORMIN) 25 MG tablet TAKE ONE TABLET BY MOUTH DAILY.  90 tablet  3  . CRESTOR 10 MG tablet TAKE 1 TABLET DAILY PRIOR TO BEDTIME  90 tablet  2  . fluconazole (DIFLUCAN) 100 MG tablet Take 1 tablet (100 mg total) by mouth daily.  10 tablet  0  . NITROSTAT 0.4 MG SL tablet DISSOLVE ONE TABLET UNDER THE TONGUE EVERY 5 MINUTES AS NEEDED.  25 tablet  0  . omeprazole (PRILOSEC) 20 MG capsule Take 1 capsule (20 mg total) by mouth once. 1 tab daily  90 capsule  3  . triamterene-hydrochlorothiazide (MAXZIDE-25) 37.5-25 MG per tablet TAKE ONE TABLET BY MOUTH EVERY DAY  90 tablet  0   No current facility-administered medications on file prior to visit.     Review of Systems System review is negative for any constitutional, cardiac, pulmonary, GI or neuro symptoms or complaints other than as described in the HPI.     Objective:   Physical Exam Filed Vitals:   12/06/13 1113  BP: 116/80  Pulse: 62  Temp: 97 F (36.1 C)   Gen'l- overweight man in no distress Cor- RRR Pulm - normal respirations Derm - swath of erythematous macular rash with vesicles from mid back left radiating to umbilicus.       Assessment & Plan:  Shingles - classic presentation. Reviewed UpToDate - reasonable to treat with anti-viral medication at greater than 72 hours if there are  continued eruption of new vesicles.  Plan Valtrex 1g tid x 7  Prednisone 20 mg daily x 10 days  Tramadol 50-100 mg TID if needed for pain  [place] soaks.

## 2013-12-18 ENCOUNTER — Other Ambulatory Visit: Payer: Self-pay | Admitting: Internal Medicine

## 2013-12-30 ENCOUNTER — Other Ambulatory Visit: Payer: Self-pay | Admitting: Internal Medicine

## 2014-01-12 DIAGNOSIS — H1045 Other chronic allergic conjunctivitis: Secondary | ICD-10-CM | POA: Diagnosis not present

## 2014-01-12 DIAGNOSIS — H02059 Trichiasis without entropian unspecified eye, unspecified eyelid: Secondary | ICD-10-CM | POA: Diagnosis not present

## 2014-01-21 ENCOUNTER — Other Ambulatory Visit: Payer: Self-pay | Admitting: Internal Medicine

## 2014-02-20 ENCOUNTER — Other Ambulatory Visit: Payer: Self-pay | Admitting: Internal Medicine

## 2014-03-02 DIAGNOSIS — L570 Actinic keratosis: Secondary | ICD-10-CM | POA: Diagnosis not present

## 2014-03-02 DIAGNOSIS — Z85828 Personal history of other malignant neoplasm of skin: Secondary | ICD-10-CM | POA: Diagnosis not present

## 2014-03-02 DIAGNOSIS — L821 Other seborrheic keratosis: Secondary | ICD-10-CM | POA: Diagnosis not present

## 2014-03-02 DIAGNOSIS — D1801 Hemangioma of skin and subcutaneous tissue: Secondary | ICD-10-CM | POA: Diagnosis not present

## 2014-03-02 DIAGNOSIS — L819 Disorder of pigmentation, unspecified: Secondary | ICD-10-CM | POA: Diagnosis not present

## 2014-03-03 ENCOUNTER — Ambulatory Visit (INDEPENDENT_AMBULATORY_CARE_PROVIDER_SITE_OTHER): Payer: Medicare Other | Admitting: Internal Medicine

## 2014-03-03 ENCOUNTER — Encounter: Payer: Self-pay | Admitting: Internal Medicine

## 2014-03-03 VITALS — BP 118/68 | HR 48 | Ht 72.0 in | Wt 213.4 lb

## 2014-03-03 DIAGNOSIS — Z0000 Encounter for general adult medical examination without abnormal findings: Secondary | ICD-10-CM

## 2014-03-03 DIAGNOSIS — R3989 Other symptoms and signs involving the genitourinary system: Secondary | ICD-10-CM | POA: Diagnosis not present

## 2014-03-03 DIAGNOSIS — R39198 Other difficulties with micturition: Secondary | ICD-10-CM

## 2014-03-03 DIAGNOSIS — I2510 Atherosclerotic heart disease of native coronary artery without angina pectoris: Secondary | ICD-10-CM | POA: Diagnosis not present

## 2014-03-03 DIAGNOSIS — R3911 Hesitancy of micturition: Secondary | ICD-10-CM | POA: Diagnosis not present

## 2014-03-03 LAB — PSA: PSA: 0.56 ng/mL (ref 0.10–4.00)

## 2014-03-03 MED ORDER — ATENOLOL 25 MG PO TABS: 12.5000 mg | ORAL_TABLET | Freq: Every day | ORAL | Status: DC

## 2014-03-03 NOTE — Patient Instructions (Signed)
Your physician has recommended you make the following change in your medication: CUT ATENOLOL IN 1/2.  TAKE 1/2 TABLET (12.5MG ) ONCE A DAY.  Your physician recommends that you return for lab work in: TODAY (PSA)  Your physician wants you to follow-up AROUND THE END OF November WITH DR. Harrington Challenger.   You will receive a reminder letter in the mail two months in advance. If you don't receive a letter, please call our office to schedule the follow-up appointment.

## 2014-03-03 NOTE — Progress Notes (Signed)
HPIPatient is a 80 yo with a history of CAD (s/p PROMUS stent to Cx 2009), PVCs, HTN and HL I saw him back in December.  LIpid panel showedl LDL 47, HDL 34  Patinet denies CP  No SOB  No dizziness  No PND.  Walks  Allergies  Allergen Reactions  . Niacin     Current Outpatient Prescriptions  Medication Sig Dispense Refill  . amLODipine (NORVASC) 2.5 MG tablet TAKE 1 TABLET DAILY  90 tablet  0  . aspirin 81 MG tablet Take 81 mg by mouth daily.        [name] atenolol (TENORMIN) 25 MG tablet TAKE ONE TABLET BY MOUTH DAILY.  90 tablet  3  . CRESTOR 10 MG tablet TAKE 1 TABLET DAILY PRIOR TO BEDTIME  90 tablet  2  . NITROSTAT 0.4 MG SL tablet DISSOLVE ONE TABLET UNDER THE TONGUE EVERY 5 MINUTES AS NEEDED.  25 tablet  0  . omeprazole (PRILOSEC) 20 MG capsule TAKE ONE CAPSULE BY MOUTH ONCE A DAY  90 capsule  3  . triamterene-hydrochlorothiazide (MAXZIDE-25) 37.5-25 MG per tablet TAKE ONE TABLET BY MOUTH EVERY DAY.  90 tablet  0   No current facility-administered medications for this visit.    Past Medical History  Diagnosis Date  . Coronary artery disease     stent (promus) Cx/OM'09  . Osteoarthritis     right ankle  . Recurrent skin cancer   . Prostatitis, acute   . Psoriasis   . Gout   . PVC (premature ventricular contraction)     hx  . Palpitations   . Hyperlipidemia   . Hypertension   . CAD 01/16/2009  . CHEST PAIN UNSPECIFIED 11/06/2009  . GOUT 10/13/2007  . HEARING LOSS 11/05/2010  . HYPERLIPIDEMIA 10/13/2007  . HYPERTENSION 10/13/2007  . OSTEOARTHRITIS, ANKLE, RIGHT 10/13/2007  . 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 PALPITATIONS, HX OF 10/13/2007  . PREMATURE VENTRICULAR CONTRACTIONS 10/13/2007  . PROSTATITIS, ACUTE, HX OF 10/13/2007  . PSORIASIS 10/13/2007  . SKIN CANCER, RECURRENT 10/13/2007    Past Surgical History  Procedure Laterality Date  . Vasectomy    . Tonsillectomy    . Appendectomy    . Tonsillectomy    . Cyst on right shoulder    . Fracture  right arm      Family History  Problem Relation Age of Onset  . Heart attack Father 95    died  . Heart attack  50    paternal uncle died/maternal aunt died  . Parkinsonism Mother 63    died  . Breast cancer Sister     died  . Lung cancer      uncle died    History   Social History  . Marital Status: Married    Spouse Name: N/A    Number of Children: 3  . Years of Education: N/A   Occupational History  . retired     from [employer].   Social History Main Topics  . Smoking status: Never Smoker   . Smokeless tobacco: Not on file  . Alcohol Use: No  . Drug Use: No  . Sexual Activity: Not on file   Other Topics Concern  . 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  They are negative to the above problem except as previously stated.  Vital Signs: BP 118/68  Pulse 48  Ht 6' (1.829 m)  Wt 213 lb 6.4 oz (96.798 kg)  BMI 28.94 kg/m2  SpO2 98%  Physical Exam Patient is in NAD HEENT:  Normocephalic, atraumatic. EOMI, PERRLA.  Neck: JVP is normal.  No bruits.  Lungs: clear to auscultation. Basilar rales. Heart: Regular rate and rhythm. Normal S1, S2. No S3.   No significant murmurs. PMI not displaced.  Abdomen:  Supple, nontender. Normal bowel sounds. No masses. No hepatomegaly.  Extremities:   Good distal pulses throughout. No lower extremity edema.  Musculoskeletal :moving all extremities.  Neuro:   alert and oriented x3.  CN II-XII grossly intact.  EKG:  SB 48  LBBB Assessment and Plan:  1.  CAD  No symptoms of angina  2.  HL  Good control  3.  HTN  Good control  4.  PVC  Regular on exam.  Will cut atenolol to 12,5  Follw  HR.  Has  been around 50  Patient denies SOB or  dizziness    Encouraged patinet to stay active.

## 2014-03-07 DIAGNOSIS — H02059 Trichiasis without entropian unspecified eye, unspecified eyelid: Secondary | ICD-10-CM | POA: Diagnosis not present

## 2014-03-09 ENCOUNTER — Ambulatory Visit: Payer: Medicare Other | Admitting: Family Medicine

## 2014-03-24 ENCOUNTER — Encounter: Payer: Self-pay | Admitting: Family Medicine

## 2014-03-24 ENCOUNTER — Ambulatory Visit (INDEPENDENT_AMBULATORY_CARE_PROVIDER_SITE_OTHER): Payer: Medicare Other | Admitting: Family Medicine

## 2014-03-24 VITALS — BP 128/72 | HR 66 | Temp 98.1°F | Wt 214.0 lb

## 2014-03-24 DIAGNOSIS — I10 Essential (primary) hypertension: Secondary | ICD-10-CM

## 2014-03-24 DIAGNOSIS — E785 Hyperlipidemia, unspecified: Secondary | ICD-10-CM | POA: Diagnosis not present

## 2014-03-24 DIAGNOSIS — I2510 Atherosclerotic heart disease of native coronary artery without angina pectoris: Secondary | ICD-10-CM

## 2014-03-24 DIAGNOSIS — M109 Gout, unspecified: Secondary | ICD-10-CM

## 2014-03-24 DIAGNOSIS — K219 Gastro-esophageal reflux disease without esophagitis: Secondary | ICD-10-CM

## 2014-03-24 NOTE — Progress Notes (Signed)
   Subjective:    Patient ID: Jacob Bowens., male    DOB: 1933/05/06, 80 y.o.   MRN: [id number]  HPI The patient is here to establish care. He has chronic problems including history of hyperlipidemia, CAD, hypertension, PVCs, psoriasis, gout, and history of GERD. Medications reviewed and compliant with all. No recent chest pains. He continues to see cardiologist. No recent gout flareups. Blood pressure been stable. No dizziness.  He takes Crestor for hyperlipidemia. Recent lipids and other labs were reviewed. Nonsmoker. He walks some for exercise. He's had previous Prevnar 80 and other immunizations are up-to-date.  Reviewed with no major changes:  Past Medical History  Diagnosis Date  . Coronary artery disease     stent (promus) Cx/OM'09  . Osteoarthritis     right ankle  . Recurrent skin cancer   . Prostatitis, acute   . Psoriasis   . Gout   . PVC (premature ventricular contraction)     hx  . Palpitations   . Hyperlipidemia   . Hypertension   . CAD 01/16/2009  . CHEST PAIN UNSPECIFIED 11/06/2009  . GOUT 10/13/2007  . HEARING LOSS 11/05/2010  . HYPERLIPIDEMIA 10/13/2007  . HYPERTENSION 10/13/2007  . OSTEOARTHRITIS, ANKLE, RIGHT 10/13/2007  . 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 PALPITATIONS, HX OF 10/13/2007  . PREMATURE VENTRICULAR CONTRACTIONS 10/13/2007  . PROSTATITIS, ACUTE, HX OF 10/13/2007  . PSORIASIS 10/13/2007  . SKIN CANCER, RECURRENT 10/13/2007   Past Surgical History  Procedure Laterality Date  . Vasectomy    . Tonsillectomy    . Appendectomy    . Tonsillectomy    . Cyst on right shoulder    . Fracture right arm      reports that he has never smoked. He does not have any smokeless tobacco history on file. He reports that he does not drink alcohol or use illicit drugs. family history includes Breast cancer in his sister; Heart attack (age of onset: 84) in his father; Heart attack (age of onset: 85) in an other family member; Lung cancer in  an other family member; Parkinsonism (age of onset: 87) in his mother. Allergies  Allergen Reactions  . Niacin        Review of Systems  Constitutional: Negative for fatigue.  Eyes: Negative for visual disturbance.  Respiratory: Negative for cough, chest tightness and shortness of breath.   Cardiovascular: Negative for chest pain, palpitations and leg swelling.  Endocrine: Negative for polydipsia and polyuria.  Neurological: Negative for dizziness, syncope, weakness, light-headedness and headaches.       Objective:   Physical Exam  Constitutional: He is oriented to person, place, and time. He appears well-developed and well-nourished.  HENT:  Right Ear: External ear normal.  Left Ear: External ear normal.  Mouth/Throat: Oropharynx is clear and moist.  Eyes: Pupils are equal, round, and reactive to light.  Neck: Neck supple. No thyromegaly present.  Cardiovascular: Normal rate and regular rhythm.   Pulmonary/Chest: Effort normal and breath sounds normal. No respiratory distress. He has no wheezes. He has no rales.  Musculoskeletal: He exhibits no edema.  Neurological: He is alert and oriented to person, place, and time.          Assessment & Plan:  #1 hypertension. Stable at goal. Continue current medications #2 hyperlipidemia. Continue Crestor. Recheck lipids at followup  #3 history of CAD. Symptomatically stable #4 GERD which is stable on omeprazole. No recent dysphagia. #5 health maintenance. Schedule complete physical for next winter

## 2014-03-24 NOTE — Progress Notes (Signed)
Pre visit review using our clinic review tool, if applicable. No additional management support is needed unless otherwise documented below in the visit note. 

## 2014-03-30 ENCOUNTER — Other Ambulatory Visit: Payer: Self-pay | Admitting: Internal Medicine

## 2014-05-12 DIAGNOSIS — H0270 Unspecified degenerative disorders of eyelid and periocular area: Secondary | ICD-10-CM | POA: Diagnosis not present

## 2014-05-22 ENCOUNTER — Other Ambulatory Visit: Payer: Self-pay | Admitting: Internal Medicine

## 2014-06-09 ENCOUNTER — Encounter: Payer: Self-pay | Admitting: Internal Medicine

## 2014-06-10 ENCOUNTER — Other Ambulatory Visit: Payer: Self-pay | Admitting: Internal Medicine

## 2014-06-30 DIAGNOSIS — H02059 Trichiasis without entropian unspecified eye, unspecified eyelid: Secondary | ICD-10-CM | POA: Diagnosis not present

## 2014-07-01 ENCOUNTER — Ambulatory Visit (INDEPENDENT_AMBULATORY_CARE_PROVIDER_SITE_OTHER): Payer: Medicare Other | Admitting: Family Medicine

## 2014-07-01 ENCOUNTER — Encounter: Payer: Self-pay | Admitting: Family Medicine

## 2014-07-01 VITALS — BP 136/72 | HR 72 | Temp 97.9°F | Wt 215.0 lb

## 2014-07-01 DIAGNOSIS — J209 Acute bronchitis, unspecified: Secondary | ICD-10-CM | POA: Diagnosis not present

## 2014-07-01 DIAGNOSIS — I2510 Atherosclerotic heart disease of native coronary artery without angina pectoris: Secondary | ICD-10-CM | POA: Diagnosis not present

## 2014-07-01 DIAGNOSIS — Z23 Encounter for immunization: Secondary | ICD-10-CM

## 2014-07-01 NOTE — Progress Notes (Signed)
Pre visit review using our clinic review tool, if applicable. No additional management support is needed unless otherwise documented below in the visit note. 

## 2014-07-01 NOTE — Patient Instructions (Signed)
Cough, Adult  A cough is a reflex that helps clear your throat and airways. It can help heal the body or may be a reaction to an irritated airway. A cough may only last 2 or 3 weeks (acute) or may last more than 8 weeks (chronic).  CAUSES Acute cough:  Viral or bacterial infections. Chronic cough:  Infections.  Allergies.  Asthma.  Post-nasal drip.  Smoking.  Heartburn or acid reflux.  Some medicines.  Chronic lung problems (COPD).  Cancer. SYMPTOMS   Cough.  Fever.  Chest pain.  Increased breathing rate.  High-pitched whistling sound when breathing (wheezing).  Colored mucus that you cough up (sputum). TREATMENT   A bacterial cough may be treated with antibiotic medicine.  A viral cough must run its course and will not respond to antibiotics.  Your caregiver may recommend other treatments if you have a chronic cough. HOME CARE INSTRUCTIONS   Only take over-the-counter or prescription medicines for pain, discomfort, or fever as directed by your caregiver. Use cough suppressants only as directed by your caregiver.  Use a cold steam vaporizer or humidifier in your bedroom or home to help loosen secretions.  Sleep in a semi-upright position if your cough is worse at night.  Rest as needed.  Stop smoking if you smoke. SEEK IMMEDIATE MEDICAL CARE IF:   You have pus in your sputum.  Your cough starts to worsen.  You cannot control your cough with suppressants and are losing sleep.  You begin coughing up blood.  You have difficulty breathing.  You develop pain which is getting worse or is uncontrolled with medicine.  You have a fever. MAKE SURE YOU:   Understand these instructions.  Will watch your condition.  Will get help right away if you are not doing well or get worse. Document Released: 04/12/2011 Document Revised: 01/06/2012 Document Reviewed: 04/12/2011 [facility] Patient Information 2015 [place], [place]. This information is not intended  to replace advice given to you by your health care provider. Make sure you discuss any questions you have with your health care provider.  Touch base in 2 weeks if cough not improving.

## 2014-07-01 NOTE — Progress Notes (Signed)
   Subjective:    Patient ID: [name]., male    DOB: 1933-08-09, 80 y.o.   MRN: [id number]  Cough Pertinent negatives include no chest pain, chills, fever, shortness of breath or wheezing.   Patient is here with cough for the past 3-4 weeks. No history of smoking. Cough is intermittent. He does not feel sick in any way. No fevers or chills. No postnasal drip symptoms. Has GERD which is controlled by omeprazole. He apparently has some chronic bibasilar rales. Cardiologist obtained chest x-ray year ago that was normal. No appetite or weight changes. No pleuritic pain. No hemoptysis. No history of asthma. Cough is relatively mild  Past Medical History  Diagnosis Date  . Coronary artery disease     stent (promus) Cx/OM'09  . Osteoarthritis     right ankle  . Recurrent skin cancer   . Prostatitis, acute   . Psoriasis   . Gout   . PVC (premature ventricular contraction)     hx  . Palpitations   . Hyperlipidemia   . Hypertension   . CAD 01/16/2009  . CHEST PAIN UNSPECIFIED 11/06/2009  . GOUT 10/13/2007  . HEARING LOSS 11/05/2010  . HYPERLIPIDEMIA 10/13/2007  . HYPERTENSION 10/13/2007  . OSTEOARTHRITIS, ANKLE, RIGHT 10/13/2007  . 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 PALPITATIONS, HX OF 10/13/2007  . PREMATURE VENTRICULAR CONTRACTIONS 10/13/2007  . PROSTATITIS, ACUTE, HX OF 10/13/2007  . PSORIASIS 10/13/2007  . SKIN CANCER, RECURRENT 10/13/2007   Past Surgical History  Procedure Laterality Date  . Vasectomy    . Tonsillectomy    . Appendectomy    . Tonsillectomy    . Cyst on right shoulder    . Fracture right arm      reports that he has never smoked. He does not have any smokeless tobacco history on file. He reports that he does not drink alcohol or use illicit drugs. family history includes Breast cancer in his sister; Heart attack (age of onset: 74) in his father; Heart attack (age of onset: 95) in an other family member; Lung cancer in an other family  member; Parkinsonism (age of onset: 35) in his mother. Allergies  Allergen Reactions  . Niacin       Review of Systems  Constitutional: Negative for fever, chills, appetite change and unexpected weight change.  HENT: Negative for congestion.   Respiratory: Positive for cough. Negative for shortness of breath and wheezing.   Cardiovascular: Negative for chest pain, palpitations and leg swelling.       Objective:   Physical Exam  Constitutional: He appears well-developed and well-nourished.  Cardiovascular: Normal rate and regular rhythm.  Exam reveals no gallop.   No murmur heard. Pulmonary/Chest: Effort normal. No respiratory distress. He has no wheezes. He has rales.  Bibasilar rales which apparently are chronic          Assessment & Plan:  Cough. Nonfocal exam other than he has some chronic bibasilar rales which are not new. Suspect viral bronchitis. Measures to reduce reflux discussed. Offered cough suppressants but his cough is mild. Avoid mentholated products.

## 2014-07-08 ENCOUNTER — Telehealth: Payer: Self-pay | Admitting: Family Medicine

## 2014-07-08 NOTE — Telephone Encounter (Signed)
[facility] [place], [place] - [address] Is requesting re-fill on predniSONE (DELTASONE) 20 MG tablet

## 2014-07-08 NOTE — Telephone Encounter (Signed)
Pt needs to make appt. Pt will call the office to make appt.

## 2014-08-01 ENCOUNTER — Other Ambulatory Visit: Payer: Self-pay | Admitting: Internal Medicine

## 2014-08-02 ENCOUNTER — Telehealth: Payer: Self-pay | Admitting: Family Medicine

## 2014-08-02 MED ORDER — OMEPRAZOLE 20 MG PO CPDR: DELAYED_RELEASE_CAPSULE | ORAL | Status: DC

## 2014-08-02 NOTE — Telephone Encounter (Signed)
Pt needs a 90 day re-fill on omeprazole (PRILOSEC) 20 MG capsule sent to [facility], [place]

## 2014-08-02 NOTE — Telephone Encounter (Signed)
Rx sent to mail order

## 2014-08-16 ENCOUNTER — Other Ambulatory Visit: Payer: Self-pay | Admitting: Internal Medicine

## 2014-08-29 DIAGNOSIS — H02052 Trichiasis without entropian right lower eyelid: Secondary | ICD-10-CM | POA: Diagnosis not present

## 2014-09-04 NOTE — Progress Notes (Signed)
HPIPatient is a 80 yo with a history of CAD (s/p PROMUS stent to Cx 2009), PVCs, HTN and HL I saw him back in May  SInce seen he has done fairly well   Denies CP  No SOB  Active  No dizziness  Walks 30 min per day    Does note a dry cough  Denies reflux.    Allergies  Allergen Reactions  . Niacin     Current Outpatient Prescriptions  Medication Sig Dispense Refill  . amLODipine (NORVASC) 2.5 MG tablet Take 1 tablet (2.5 mg total) by mouth daily. 90 tablet 3  . aspirin 81 MG tablet Take 81 mg by mouth daily.      [name] atenolol (TENORMIN) 25 MG tablet Take 0.5 tablets (12.5 mg total) by mouth daily. 90 tablet 3  . CRESTOR 10 MG tablet TAKE 1 TABLET DAILY PRIOR TO BEDTIME 90 tablet 1  . NITROSTAT 0.4 MG SL tablet DISSOLVE ONE TABLET UNDER THE TONGUE EVERY 5 MINUTES AS NEEDED 25 tablet 0  . omeprazole (PRILOSEC) 20 MG capsule TAKE ONE CAPSULE BY MOUTH ONCE A DAY 90 capsule 3  . triamterene-hydrochlorothiazide (MAXZIDE-25) 37.5-25 MG per tablet TAKE ONE TABLET BY MOUTH ONCE DAILY 90 tablet 3   No current facility-administered medications for this visit.    Past Medical History  Diagnosis Date  . Coronary artery disease     stent (promus) Cx/OM'09  . Osteoarthritis     right ankle  . Recurrent skin cancer   . Prostatitis, acute   . Psoriasis   . Gout   . PVC (premature ventricular contraction)     hx  . Palpitations   . Hyperlipidemia   . Hypertension   . CAD 01/16/2009  . CHEST PAIN UNSPECIFIED 11/06/2009  . GOUT 10/13/2007  . HEARING LOSS 11/05/2010  . HYPERLIPIDEMIA 10/13/2007  . HYPERTENSION 10/13/2007  . OSTEOARTHRITIS, ANKLE, RIGHT 10/13/2007  . 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 PALPITATIONS, HX OF 10/13/2007  . PREMATURE VENTRICULAR CONTRACTIONS 10/13/2007  . PROSTATITIS, ACUTE, HX OF 10/13/2007  . PSORIASIS 10/13/2007  . SKIN CANCER, RECURRENT 10/13/2007    Past Surgical History  Procedure Laterality Date  . Vasectomy    . Tonsillectomy    .  Appendectomy    . Tonsillectomy    . Cyst on right shoulder    . Fracture right arm      Family History  Problem Relation Age of Onset  . Heart attack Father 76    died  . Heart attack  50    paternal uncle died/maternal aunt died  . Parkinsonism Mother 77    died  . Breast cancer Sister     died  . Lung cancer      uncle died    History   Social History  . Marital Status: Married    Spouse Name: N/A    Number of Children: 3  . Years of Education: N/A   Occupational History  . retired     from [employer].   Social History Main Topics  . Smoking status: Never Smoker   . Smokeless tobacco: Not on file  . Alcohol Use: No  . Drug Use: No  . Sexual Activity: Not on file   Other Topics Concern  . Not on file   Social History Narrative   Married 1958   2 daughters- '59, 68, 1 son '61   8 grandchildren   Retired from [employer], works PT as a Curator. Now retired  completely (09/2008)   End of life; does not want heroic measures if in a persistent vegative state    Review of Systems:  All systems reviewed.  They are negative to the above problem except as previously stated.  Vital Signs: BP 158/76 mmHg  Pulse 56  Ht 6\' 1"  (1.854 m)  Wt 212 lb (96.163 kg)  BMI 27.98 kg/m2 BP on my check 128/80 R arm; 120/80 L arm   Physical Exam Patient is in NAD HEENT:  Normocephalic, atraumatic. EOMI, PERRLA.  Neck: JVP is normal.  No bruits.  Lungs: clear to auscultation. Basilar rales. Heart: Regular rate and rhythm. Normal S1, S2. No S3.   No significant murmurs. PMI not displaced.  Abdomen:  Supple, nontender. Normal bowel sounds. No masses. No hepatomegaly.  Extremities:   Good distal pulses throughout. No lower extremity edema.  Musculoskeletal :moving all extremities.  Neuro:   alert and oriented x3.  CN II-XII grossly intact.  EKG:  SB 55 bpm  RBBB  LAFB   Assessment and Plan:  1.  Cough  Will check BNP  Can try taking omeprazole at night   1.  CAD  I am not convinced of  active angina    2.  HL  Good control  Wll get checked again in Jan    3.  HTN  Repeat bp is good  Follow    4.  PVC  No ectopy on exam  Follow      Encouraged patinet to stay active.

## 2014-09-05 ENCOUNTER — Ambulatory Visit (INDEPENDENT_AMBULATORY_CARE_PROVIDER_SITE_OTHER): Payer: Medicare Other | Admitting: Internal Medicine

## 2014-09-05 ENCOUNTER — Encounter: Payer: Self-pay | Admitting: Internal Medicine

## 2014-09-05 VITALS — BP 158/76 | HR 56 | Ht 73.0 in | Wt 212.0 lb

## 2014-09-05 DIAGNOSIS — I2510 Atherosclerotic heart disease of native coronary artery without angina pectoris: Secondary | ICD-10-CM

## 2014-09-05 DIAGNOSIS — I10 Essential (primary) hypertension: Secondary | ICD-10-CM

## 2014-09-05 LAB — BRAIN NATRIURETIC PEPTIDE: PRO B NATRI PEPTIDE: 37 pg/mL (ref 0.0–100.0)

## 2014-09-05 LAB — BASIC METABOLIC PANEL
BUN: 14 mg/dL (ref 6–23)
CALCIUM: 9.2 mg/dL (ref 8.4–10.5)
CO2: 26 mEq/L (ref 19–32)
Chloride: 102 mEq/L (ref 96–112)
Creatinine, Ser: 1.1 mg/dL (ref 0.4–1.5)
GFR: 65.78 mL/min (ref >60.00)
GLUCOSE: 97 mg/dL (ref 70–99)
POTASSIUM: 3.9 meq/L (ref 3.5–5.1)
SODIUM: 138 meq/L (ref 135–145)

## 2014-09-05 LAB — CBC
HCT: 48.7 % (ref 39.0–52.0)
Hemoglobin: 16.2 g/dL (ref 13.0–17.0)
MCHC: 33.2 g/dL (ref 30.0–36.0)
MCV: 97.7 fl (ref 78.0–100.0)
Platelets: 209 10*3/uL (ref 150.0–400.0)
RBC: 4.98 Mil/uL (ref 4.22–5.81)
RDW: 14.8 % (ref 11.5–15.5)
WBC: 7.7 10*3/uL (ref 4.0–10.5)

## 2014-09-05 NOTE — Patient Instructions (Signed)
Your physician recommends that you continue on your current medications as directed. Please refer to the Current Medication list given to you today. Your physician recommends that you return for lab work today (BMET, CBC, BNP)  

## 2014-09-26 DIAGNOSIS — L821 Other seborrheic keratosis: Secondary | ICD-10-CM | POA: Diagnosis not present

## 2014-09-26 DIAGNOSIS — L570 Actinic keratosis: Secondary | ICD-10-CM | POA: Diagnosis not present

## 2014-09-26 DIAGNOSIS — Z85828 Personal history of other malignant neoplasm of skin: Secondary | ICD-10-CM | POA: Diagnosis not present

## 2014-10-18 DIAGNOSIS — H02052 Trichiasis without entropian right lower eyelid: Secondary | ICD-10-CM | POA: Diagnosis not present

## 2014-10-24 ENCOUNTER — Other Ambulatory Visit: Payer: Self-pay | Admitting: Internal Medicine

## 2014-11-09 ENCOUNTER — Encounter: Payer: Self-pay | Admitting: Family Medicine

## 2014-11-09 ENCOUNTER — Ambulatory Visit (INDEPENDENT_AMBULATORY_CARE_PROVIDER_SITE_OTHER): Payer: Medicare Other | Admitting: Family Medicine

## 2014-11-09 VITALS — BP 132/68 | HR 58 | Temp 97.5°F | Ht 71.0 in | Wt 212.0 lb

## 2014-11-09 DIAGNOSIS — M1000 Idiopathic gout, unspecified site: Secondary | ICD-10-CM

## 2014-11-09 DIAGNOSIS — K219 Gastro-esophageal reflux disease without esophagitis: Secondary | ICD-10-CM | POA: Diagnosis not present

## 2014-11-09 DIAGNOSIS — E785 Hyperlipidemia, unspecified: Secondary | ICD-10-CM

## 2014-11-09 DIAGNOSIS — I10 Essential (primary) hypertension: Secondary | ICD-10-CM | POA: Diagnosis not present

## 2014-11-09 DIAGNOSIS — Z0000 Encounter for general adult medical examination without abnormal findings: Secondary | ICD-10-CM | POA: Diagnosis not present

## 2014-11-09 LAB — HEPATIC FUNCTION PANEL
ALT: 13 U/L (ref 0–53)
AST: 18 U/L (ref 0–37)
Albumin: 3.9 g/dL (ref 3.5–5.2)
Alkaline Phosphatase: 67 U/L (ref 39–117)
Bilirubin, Direct: 0.1 mg/dL (ref 0.0–0.3)
TOTAL PROTEIN: 7 g/dL (ref 6.0–8.3)
Total Bilirubin: 0.6 mg/dL (ref 0.2–1.2)

## 2014-11-09 LAB — LIPID PANEL
CHOLESTEROL: 110 mg/dL (ref 0–200)
HDL: 32.6 mg/dL — ABNORMAL LOW (ref >39.00)
LDL Cholesterol: 53 mg/dL (ref 0–99)
NonHDL: 77.4
Total CHOL/HDL Ratio: 3
Triglycerides: 124 mg/dL (ref 0.0–149.0)
VLDL: 24.8 mg/dL (ref 0.0–40.0)

## 2014-11-09 NOTE — Progress Notes (Signed)
Subjective:    Patient ID: Jacob Bowens., male    DOB: [birth year], 81 y.o.   MRN: [id number]  HPI Patient seen for Medicare wellness exam and medical follow-up. His chronic problems are as included below and reviewed. He has history of CAD, hyperlipidemia, hypertension, gout, chronic hearing loss, GERD. Followed by cardiology. Remains on Crestor for hyperlipidemia. Blood pressure treated with Maxzide and amlodipine along with low-dose atenolol. No recent chest pains. No dizziness. Compliant with medications. No recent gout flareups.  He had recent electrolytes through cardiology which were normal. Immunizations are up-to-date. No history of shingles vaccine but has had clinical shingles few years ago. He is not interested at this point in shingles vaccine. Colonoscopy up-to-date  Past Medical History  Diagnosis Date  . Coronary artery disease     stent (promus) Cx/OM'09  . Osteoarthritis     right ankle  . Recurrent skin cancer   . Prostatitis, acute   . Psoriasis   . Gout   . PVC (premature ventricular contraction)     hx  . Palpitations   . Hyperlipidemia   . Hypertension   . CAD 01/16/2009  . CHEST PAIN UNSPECIFIED 11/06/2009  . GOUT 10/13/2007  . HEARING LOSS 11/05/2010  . HYPERLIPIDEMIA 10/13/2007  . HYPERTENSION 10/13/2007  . OSTEOARTHRITIS, ANKLE, RIGHT 10/13/2007  . 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 PALPITATIONS, HX OF 10/13/2007  . PREMATURE VENTRICULAR CONTRACTIONS 10/13/2007  . PROSTATITIS, ACUTE, HX OF 10/13/2007  . PSORIASIS 10/13/2007  . SKIN CANCER, RECURRENT 10/13/2007   Past Surgical History  Procedure Laterality Date  . Vasectomy    . Tonsillectomy    . Appendectomy    . Tonsillectomy    . Cyst on right shoulder    . Fracture right arm      reports that he has never smoked. He does not have any smokeless tobacco history on file. He reports that he does not drink alcohol or use illicit drugs. family history includes Breast cancer in  his sister; Heart attack (age of onset: 63) in his father; Heart attack (age of onset: 96) in an other family member; Lung cancer in an other family member; Parkinsonism (age of onset: 18) in his mother. Allergies  Allergen Reactions  . Niacin    1.  Risk factors based on Past Medical , Social, and Family history reviewed and as indicated above with no changes 2.  Limitations in physical activities None.  No recent falls. 3.  Depression/mood No active depression or anxiety issues 4.  Hearing chronic bilateral hearing loss. Followed by ENT 5.  ADLs independent in all. 6.  Cognitive function (orientation to time and place, language, writing, speech,memory) no short or long term memory issues.  Language and judgement intact. 7.  Home Safety no issues 8.  Height, weight, and visual acuity.all stable. 9.  Counseling discussed yearly flu vaccine. Importance of regular weightbearing exercise 10. Recommendation of preventive services. Discussed shingles vaccine at this point he wishes to wait 11. Labs based on risk factors lipid and hepatic 12. Care Plan as above 13. Other Providers Dr. Ross-cardiology, Dr. Jones-dermatology 14. Written schedule of screening/prevention services given to patient.    Review of Systems  Constitutional: Negative for fever, activity change, appetite change and fatigue.  HENT: Negative for congestion, ear pain and trouble swallowing.   Eyes: Negative for pain and visual disturbance.  Respiratory: Negative for cough, shortness of breath and wheezing.   Cardiovascular: Negative for chest pain and  palpitations.  Gastrointestinal: Negative for nausea, vomiting, abdominal pain, diarrhea, constipation, blood in stool, abdominal distention and rectal pain.  Genitourinary: Negative for dysuria, hematuria and testicular pain.  Musculoskeletal: Negative for joint swelling and arthralgias.  Skin: Negative for rash.  Neurological: Negative for dizziness, syncope and headaches.   Hematological: Negative for adenopathy.  Psychiatric/Behavioral: Negative for confusion and dysphoric mood.       Objective:   Physical Exam  Constitutional: He is oriented to person, place, and time. He appears well-developed and well-nourished. No distress.  HENT:  Head: Normocephalic and atraumatic.  Right Ear: External ear normal.  Left Ear: External ear normal.  Mouth/Throat: Oropharynx is clear and moist.  Eyes: Conjunctivae and EOM are normal. Pupils are equal, round, and reactive to light.  Neck: Normal range of motion. Neck supple. No thyromegaly present.  Cardiovascular: Normal rate, regular rhythm and normal heart sounds.   No murmur heard. Pulmonary/Chest: No respiratory distress. He has no wheezes. He has no rales.  Abdominal: Soft. Bowel sounds are normal. He exhibits no distension and no mass. There is no tenderness. There is no rebound and no guarding.  Musculoskeletal: He exhibits no edema.  Lymphadenopathy:    He has no cervical adenopathy.  Neurological: He is alert and oriented to person, place, and time. He displays normal reflexes. No cranial nerve deficit.  Skin: No rash noted.  Psychiatric: He has a normal mood and affect.          Assessment & Plan:  #1 health maintenance. Discussed shingles vaccine and at this point he wishes to wait. Other immunizations up-to-date. Colonoscopy up-to-date #2 hyperlipidemia. Check lipid and hepatic panel. Continue Crestor #3 hypertension well controlled. Continue current medications #4 gout. No recent flareups. Decided not to check uric acid since he is not on any suppressive therapy and no recent flareups

## 2014-11-09 NOTE — Progress Notes (Signed)
Pre visit review using our clinic review tool, if applicable. No additional management support is needed unless otherwise documented below in the visit note. 

## 2014-11-09 NOTE — Patient Instructions (Addendum)
Continue with yearly flu vaccine Let us know if you decide to get shingles vaccine.

## 2014-11-10 ENCOUNTER — Telehealth: Payer: Self-pay | Admitting: Family Medicine

## 2014-11-10 NOTE — Telephone Encounter (Signed)
emmi mailed  °

## 2014-11-11 ENCOUNTER — Encounter: Payer: Self-pay | Admitting: *Deleted

## 2014-12-05 DIAGNOSIS — H02052 Trichiasis without entropian right lower eyelid: Secondary | ICD-10-CM | POA: Diagnosis not present

## 2014-12-13 DIAGNOSIS — H16201 Unspecified keratoconjunctivitis, right eye: Secondary | ICD-10-CM | POA: Diagnosis not present

## 2014-12-15 DIAGNOSIS — H16201 Unspecified keratoconjunctivitis, right eye: Secondary | ICD-10-CM | POA: Diagnosis not present

## 2014-12-19 DIAGNOSIS — H16203 Unspecified keratoconjunctivitis, bilateral: Secondary | ICD-10-CM | POA: Diagnosis not present

## 2014-12-21 DIAGNOSIS — H16203 Unspecified keratoconjunctivitis, bilateral: Secondary | ICD-10-CM | POA: Diagnosis not present

## 2014-12-27 DIAGNOSIS — H1045 Other chronic allergic conjunctivitis: Secondary | ICD-10-CM | POA: Diagnosis not present

## 2014-12-31 ENCOUNTER — Other Ambulatory Visit: Payer: Self-pay | Admitting: Internal Medicine

## 2015-01-17 DIAGNOSIS — H66002 Acute suppurative otitis media without spontaneous rupture of ear drum, left ear: Secondary | ICD-10-CM | POA: Diagnosis not present

## 2015-02-07 DIAGNOSIS — H02052 Trichiasis without entropian right lower eyelid: Secondary | ICD-10-CM | POA: Diagnosis not present

## 2015-02-24 ENCOUNTER — Ambulatory Visit (INDEPENDENT_AMBULATORY_CARE_PROVIDER_SITE_OTHER): Payer: Medicare Other | Admitting: Family Medicine

## 2015-02-24 ENCOUNTER — Encounter: Payer: Self-pay | Admitting: Family Medicine

## 2015-02-24 VITALS — BP 130/70 | HR 65 | Temp 98.1°F | Wt 216.0 lb

## 2015-02-24 DIAGNOSIS — M79601 Pain in right arm: Secondary | ICD-10-CM

## 2015-02-24 NOTE — Progress Notes (Signed)
Pre visit review using our clinic review tool, if applicable. No additional management support is needed unless otherwise documented below in the visit note. 

## 2015-02-24 NOTE — Progress Notes (Signed)
   Subjective:    Patient ID: [name]., male    DOB: June 18, 1933, 81 y.o.   MRN: [id number]  HPI Patient seen with right forearm pain. This has gone for 2 weeks. He has mild pain which is only painful when he presses against an object around the mid aspect of the right arm. He has not noted any swelling. No bruising. No redness or warmth. He actually has less pain today. No pain with gripping. No neck pain. No weakness or numbness. No direct pain to touch.  Past Medical History  Diagnosis Date  . Coronary artery disease     stent (promus) Cx/OM'09  . Osteoarthritis     right ankle  . Recurrent skin cancer   . Prostatitis, acute   . Psoriasis   . Gout   . PVC (premature ventricular contraction)     hx  . Palpitations   . Hyperlipidemia   . Hypertension   . CAD 01/16/2009  . CHEST PAIN UNSPECIFIED 11/06/2009  . GOUT 10/13/2007  . HEARING LOSS 11/05/2010  . HYPERLIPIDEMIA 10/13/2007  . HYPERTENSION 10/13/2007  . OSTEOARTHRITIS, ANKLE, RIGHT 10/13/2007  . 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 PALPITATIONS, HX OF 10/13/2007  . PREMATURE VENTRICULAR CONTRACTIONS 10/13/2007  . PROSTATITIS, ACUTE, HX OF 10/13/2007  . PSORIASIS 10/13/2007  . SKIN CANCER, RECURRENT 10/13/2007   Past Surgical History  Procedure Laterality Date  . Vasectomy    . Tonsillectomy    . Appendectomy    . Tonsillectomy    . Cyst on right shoulder    . Fracture right arm      reports that he has never smoked. He does not have any smokeless tobacco history on file. He reports that he does not drink alcohol or use illicit drugs. family history includes Breast cancer in his sister; Heart attack (age of onset: 51) in his father; Heart attack (age of onset: 38) in an other family member; Lung cancer in an other family member; Parkinsonism (age of onset: 18) in his mother. Allergies  Allergen Reactions  . Niacin       Review of Systems  Constitutional: Negative for appetite change and  unexpected weight change.  Neurological: Negative for weakness and numbness.  Hematological: Negative for adenopathy.       Objective:   Physical Exam  Constitutional: He appears well-developed and well-nourished.  Cardiovascular: Normal rate and regular rhythm.   Pulmonary/Chest: Effort normal and breath sounds normal. No respiratory distress. He has no wheezes. He has no rales.  Musculoskeletal: He exhibits no edema.  Right forearm reveals no visible swelling or erythema or warmth. No reproducible tenderness. Full range of motion right elbow and right shoulder. No lateral or medial epicondylar tenderness  Neurological:  Full strength right upper extremity. Normal sensory function to touch.          Assessment & Plan:  Right forearm pain. Totally nonfocal exam. Symptoms improved today. No evidence for neurologic weakness or numbness. Observe for now. If pain recurs he'll be in touch of that is 1-2 weeks and will start with plain x-rays of the right forearm

## 2015-02-24 NOTE — Patient Instructions (Signed)
Follow up for any upper extremity numbness, or weakness, or progressive/persistent pain.

## 2015-03-20 DIAGNOSIS — H02059 Trichiasis without entropian unspecified eye, unspecified eyelid: Secondary | ICD-10-CM | POA: Diagnosis not present

## 2015-03-23 ENCOUNTER — Ambulatory Visit (INDEPENDENT_AMBULATORY_CARE_PROVIDER_SITE_OTHER): Payer: Medicare Other | Admitting: Internal Medicine

## 2015-03-23 ENCOUNTER — Encounter: Payer: Self-pay | Admitting: Internal Medicine

## 2015-03-23 VITALS — BP 104/60 | HR 60 | Ht 71.0 in | Wt 213.8 lb

## 2015-03-23 DIAGNOSIS — I10 Essential (primary) hypertension: Secondary | ICD-10-CM

## 2015-03-23 DIAGNOSIS — E785 Hyperlipidemia, unspecified: Secondary | ICD-10-CM | POA: Diagnosis not present

## 2015-03-23 NOTE — Progress Notes (Signed)
Cardiology Office Note   Date:  03/23/2015   ID:  [name]., DOB 13-Jun-1933, MRN [id number]  PCP:  Eulas Post, MD  Cardiologist:   Dorris Carnes, MD   No chief complaint on file.     History of Present Illness: Jacob Card. is a 81 y.o. male with a history of CAD (s/p PROMUS stent to LCx in 2009, PVCs, HTN and HL  Since I saw him last he denies CP  Breathing is OK  No dizziness.    Current Outpatient Prescriptions  Medication Sig Dispense Refill  . amLODipine (NORVASC) 2.5 MG tablet Take 1 tablet (2.5 mg total) by mouth daily. 90 tablet 3  . aspirin 81 MG tablet Take 81 mg by mouth daily.      [name] atenolol (TENORMIN) 25 MG tablet Take 0.5 tablets (12.5 mg total) by mouth daily. 90 tablet 3  . CRESTOR 10 MG tablet TAKE 1 TABLET DAILY PRIOR TO BEDTIME 90 tablet 1  . NITROSTAT 0.4 MG SL tablet DISSOLVE ONE TABLET UNDER THE TONGUE EVERY 5 MINUTES AS NEEDED 25 tablet 0  . omeprazole (PRILOSEC) 20 MG capsule TAKE ONE CAPSULE BY MOUTH ONCE A DAY 90 capsule 3  . triamterene-hydrochlorothiazide (MAXZIDE-25) 37.5-25 MG per tablet TAKE ONE TABLET BY MOUTH ONCE DAILY 90 tablet 3   No current facility-administered medications for this visit.    Allergies:   Niacin   Past Medical History  Diagnosis Date  . Coronary artery disease     stent (promus) Cx/OM'09  . Osteoarthritis     right ankle  . Recurrent skin cancer   . Prostatitis, acute   . Psoriasis   . Gout   . PVC (premature ventricular contraction)     hx  . Palpitations   . Hyperlipidemia   . Hypertension   . CAD 01/16/2009  . CHEST PAIN UNSPECIFIED 11/06/2009  . GOUT 10/13/2007  . HEARING LOSS 11/05/2010  . HYPERLIPIDEMIA 10/13/2007  . HYPERTENSION 10/13/2007  . OSTEOARTHRITIS, ANKLE, RIGHT 10/13/2007  . 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 PALPITATIONS, HX OF 10/13/2007  . PREMATURE VENTRICULAR CONTRACTIONS 10/13/2007  . PROSTATITIS, ACUTE, HX OF 10/13/2007  . PSORIASIS 10/13/2007    . SKIN CANCER, RECURRENT 10/13/2007    Past Surgical History  Procedure Laterality Date  . Vasectomy    . Tonsillectomy    . Appendectomy    . Tonsillectomy    . Cyst on right shoulder    . Fracture right arm       Social History:  The patient  reports that he has never smoked. He does not have any smokeless tobacco history on file. He reports that he does not drink alcohol or use illicit drugs.   Family History:  The patient's family history includes Breast cancer in his sister; Heart attack (age of onset: 29) in his father; Heart attack (age of onset: 32) in an other family member; Lung cancer in an other family member; Parkinsonism (age of onset: 68) in his mother.    ROS:  Please see the history of present illness. All other systems are reviewed and  Negative to the above problem except as noted.    PHYSICAL EXAM: VS:  BP 104/60 mmHg  Pulse 60  Ht 5\' 11"  (1.803 m)  Wt 213 lb 12.8 oz (96.979 kg)  BMI 29.83 kg/m2  GEN: Well nourished, well developed, in no acute distress HEENT: normal Neck: no JVD, carotid bruits, or masses Cardiac: RRR; no  murmurs, rubs, or gallops,no edema  Respiratory:  clear to auscultation bilaterally, normal work of breathing GI: soft, nontender, nondistended, + BS  No hepatomegaly  MS: no deformity Moving all extremities   Skin: warm and dry,  Erythematous patch arms and face   Neuro:  Strength and sensation are intact Psych: euthymic mood, full affect   EKG:  EKG is not ordered today.   Lipid Panel    Component Value Date/Time   CHOL 110 11/09/2014 0950   TRIG 124.0 11/09/2014 0950   HDL 32.60* 11/09/2014 0950   CHOLHDL 3 11/09/2014 0950   VLDL 24.8 11/09/2014 0950   LDLCALC 53 11/09/2014 0950   LDLDIRECT 128.0 11/16/2007 1454      Wt Readings from Last 3 Encounters:  03/23/15 213 lb 12.8 oz (96.979 kg)  02/24/15 216 lb (97.977 kg)  11/09/14 212 lb (96.163 kg)      ASSESSMENT AND PLAN:  1.  CAD  No symtoms of angina  2.   HTN  Continue meds  Pts BP is 130s  3.  HL  Continue statin     Current medicines are reviewed at length with the patient today.  The patient does not have concerns regarding medicines.  The following changes have been made:   None    Labs/ tests ordered today include:  None No orders of the defined types were placed in this encounter.     Disposition:   FU with  in   Signed, Dorris Carnes, MD  03/23/2015 11:30 AM    [address], [place], [place]  [address] Phone: [phone]; Fax: [phone]

## 2015-03-23 NOTE — Patient Instructions (Signed)
Medication Instructions:  None  Labwork: None  Testing/Procedures: None  Follow-Up: Your physician wants you to follow-up in: 5 months.  You will receive a reminder letter in the mail two months in advance. If you don't receive a letter, please call our office to schedule the follow-up appointment.   Any Other Special Instructions Will Be Listed Below (If Applicable).

## 2015-03-24 ENCOUNTER — Ambulatory Visit: Payer: Medicare Other | Admitting: Internal Medicine

## 2015-04-07 DIAGNOSIS — H2513 Age-related nuclear cataract, bilateral: Secondary | ICD-10-CM | POA: Diagnosis not present

## 2015-05-08 DIAGNOSIS — Z85828 Personal history of other malignant neoplasm of skin: Secondary | ICD-10-CM | POA: Diagnosis not present

## 2015-05-08 DIAGNOSIS — L570 Actinic keratosis: Secondary | ICD-10-CM | POA: Diagnosis not present

## 2015-05-08 DIAGNOSIS — L245 Irritant contact dermatitis due to other chemical products: Secondary | ICD-10-CM | POA: Diagnosis not present

## 2015-05-10 ENCOUNTER — Other Ambulatory Visit: Payer: Self-pay | Admitting: *Deleted

## 2015-05-17 DIAGNOSIS — H02052 Trichiasis without entropian right lower eyelid: Secondary | ICD-10-CM | POA: Diagnosis not present

## 2015-05-23 ENCOUNTER — Other Ambulatory Visit: Payer: Self-pay | Admitting: Internal Medicine

## 2015-05-26 DIAGNOSIS — H02052 Trichiasis without entropian right lower eyelid: Secondary | ICD-10-CM | POA: Diagnosis not present

## 2015-06-12 DIAGNOSIS — H60332 Swimmer's ear, left ear: Secondary | ICD-10-CM | POA: Diagnosis not present

## 2015-06-27 ENCOUNTER — Encounter: Payer: Self-pay | Admitting: Internal Medicine

## 2015-06-30 ENCOUNTER — Encounter: Payer: Self-pay | Admitting: Family Medicine

## 2015-06-30 ENCOUNTER — Ambulatory Visit (INDEPENDENT_AMBULATORY_CARE_PROVIDER_SITE_OTHER): Payer: Medicare Other | Admitting: Family Medicine

## 2015-06-30 VITALS — BP 120/60 | HR 68 | Temp 98.3°F | Wt 210.0 lb

## 2015-06-30 DIAGNOSIS — Z23 Encounter for immunization: Secondary | ICD-10-CM

## 2015-06-30 DIAGNOSIS — R05 Cough: Secondary | ICD-10-CM

## 2015-06-30 DIAGNOSIS — R059 Cough, unspecified: Secondary | ICD-10-CM

## 2015-06-30 NOTE — Progress Notes (Signed)
   Subjective:    Patient ID: Jacob Bowens., male    DOB: [birth year], 81 y.o.   MRN: [id number]  HPI Patient seen with cough for the past couple weeks. Never smoked. Cough is mostly dry. No fever. No dyspnea. Relatively mild cough. Previous chest x-ray 2014 slightly elevated right hemidiaphragm. He does apparently have past history of by basilar rales which prompted his chest x-ray per cardiology back in 2014.  Patient had tympanostomy tube placed left ear 2 weeks ago. No postnasal drip. GERD symptoms controlled with omeprazole. No hemoptysis. No appetite changes. No pleuritic pain. He has been walking at least 30 minutes per day without difficulty  Past Medical History  Diagnosis Date  . Coronary artery disease     stent (promus) Cx/OM'09  . Osteoarthritis     right ankle  . Recurrent skin cancer   . Prostatitis, acute   . Psoriasis   . Gout   . PVC (premature ventricular contraction)     hx  . Palpitations   . Hyperlipidemia   . Hypertension   . CAD 01/16/2009  . CHEST PAIN UNSPECIFIED 11/06/2009  . GOUT 10/13/2007  . HEARING LOSS 11/05/2010  . HYPERLIPIDEMIA 10/13/2007  . HYPERTENSION 10/13/2007  . OSTEOARTHRITIS, ANKLE, RIGHT 10/13/2007  . 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 PALPITATIONS, HX OF 10/13/2007  . PREMATURE VENTRICULAR CONTRACTIONS 10/13/2007  . PROSTATITIS, ACUTE, HX OF 10/13/2007  . PSORIASIS 10/13/2007  . SKIN CANCER, RECURRENT 10/13/2007   Past Surgical History  Procedure Laterality Date  . Vasectomy    . Tonsillectomy    . Appendectomy    . Tonsillectomy    . Cyst on right shoulder    . Fracture right arm      reports that he has never smoked. He does not have any smokeless tobacco history on file. He reports that he does not drink alcohol or use illicit drugs. family history includes Breast cancer in his sister; Heart attack (age of onset: 70) in his father; Heart attack (age of onset: 93) in an other family member; Lung cancer in an  other family member; Parkinsonism (age of onset: 64) in his mother. Allergies  Allergen Reactions  . Niacin       Review of Systems  Constitutional: Negative for fever, chills, appetite change and unexpected weight change.  HENT: Negative for congestion and postnasal drip.   Respiratory: Positive for cough. Negative for shortness of breath and wheezing.   Cardiovascular: Negative for chest pain.  Neurological: Negative for dizziness.       Objective:   Physical Exam  Constitutional: He appears well-developed and well-nourished. No distress.  HENT:  Right Ear: External ear normal.  Mouth/Throat: Oropharynx is clear and moist.  Tympanostomy tube left eardrum  Neck: Neck supple. No JVD present.  Cardiovascular: Normal rate and regular rhythm.   Pulmonary/Chest: Effort normal. No respiratory distress. He has no wheezes.  He has some bibasilar crackles which apparently are chronic-these were noted on previous examinations. He has no evidence to suggest volume overload  Musculoskeletal: He exhibits no edema.          Assessment & Plan:  Cough. Suspect acute viral bronchitis. He has chronic bibasal rales which are not new. He does not have any concerning symptoms such as fever, hypoxemia, dyspnea, etc. Touch base in 2 weeks if cough not resolving

## 2015-06-30 NOTE — Progress Notes (Signed)
Pre visit review using our clinic review tool, if applicable. No additional management support is needed unless otherwise documented below in the visit note. 

## 2015-06-30 NOTE — Patient Instructions (Signed)

## 2015-06-30 NOTE — Addendum Note (Signed)
Addended by: Marcina Millard on: 06/30/2015 10:25 AM   Modules accepted: Orders

## 2015-07-12 ENCOUNTER — Telehealth: Payer: Self-pay | Admitting: Family Medicine

## 2015-07-12 NOTE — Telephone Encounter (Signed)
Pt saw you on 06/30/15 and is feeling no better. Thinks you mentioned getting a chest x ray and would like to talk with you about that.

## 2015-07-12 NOTE — Telephone Encounter (Signed)
Would go ahead and get CXR.

## 2015-07-13 ENCOUNTER — Other Ambulatory Visit: Payer: Self-pay | Admitting: Family Medicine

## 2015-07-13 ENCOUNTER — Ambulatory Visit (INDEPENDENT_AMBULATORY_CARE_PROVIDER_SITE_OTHER)
Admission: RE | Admit: 2015-07-13 | Discharge: 2015-07-13 | Disposition: A | Discharge status: Home / Self Care | Payer: Medicare Other | Source: Ambulatory Visit | Attending: Family Medicine | Admitting: Family Medicine

## 2015-07-13 DIAGNOSIS — R05 Cough: Secondary | ICD-10-CM

## 2015-07-13 DIAGNOSIS — R059 Cough, unspecified: Secondary | ICD-10-CM

## 2015-07-13 NOTE — Telephone Encounter (Signed)
Pt informed. CXR is ordered.

## 2015-07-17 ENCOUNTER — Other Ambulatory Visit: Payer: Self-pay | Admitting: Internal Medicine

## 2015-07-27 ENCOUNTER — Encounter: Payer: Self-pay | Admitting: Family Medicine

## 2015-07-27 ENCOUNTER — Ambulatory Visit (INDEPENDENT_AMBULATORY_CARE_PROVIDER_SITE_OTHER): Payer: Medicare Other | Admitting: Family Medicine

## 2015-07-27 ENCOUNTER — Other Ambulatory Visit: Payer: Self-pay | Admitting: Family Medicine

## 2015-07-27 VITALS — BP 100/70 | HR 108 | Temp 98.2°F | Ht 71.0 in | Wt 201.1 lb

## 2015-07-27 DIAGNOSIS — M791 Myalgia, unspecified site: Secondary | ICD-10-CM

## 2015-07-27 DIAGNOSIS — R634 Abnormal weight loss: Secondary | ICD-10-CM

## 2015-07-27 DIAGNOSIS — R05 Cough: Secondary | ICD-10-CM

## 2015-07-27 DIAGNOSIS — R059 Cough, unspecified: Secondary | ICD-10-CM

## 2015-07-27 LAB — BASIC METABOLIC PANEL
BUN: 22 mg/dL (ref 6–23)
CALCIUM: 9 mg/dL (ref 8.4–10.5)
CO2: 30 meq/L (ref 19–32)
CREATININE: 1.26 mg/dL (ref 0.40–1.50)
Chloride: 98 mEq/L (ref 96–112)
GFR: 58.47 mL/min — ABNORMAL LOW (ref >60.00)
Glucose, Bld: 106 mg/dL — ABNORMAL HIGH (ref 70–99)
Potassium: 4.3 mEq/L (ref 3.5–5.1)
Sodium: 137 mEq/L (ref 135–145)

## 2015-07-27 LAB — SEDIMENTATION RATE: Sed Rate: 72 mm/hr — ABNORMAL HIGH (ref 0–22)

## 2015-07-27 LAB — HEPATIC FUNCTION PANEL
ALT: 17 U/L (ref 0–53)
AST: 19 U/L (ref 0–37)
Albumin: 3.2 g/dL — ABNORMAL LOW (ref 3.5–5.2)
Alkaline Phosphatase: 64 U/L (ref 39–117)
BILIRUBIN DIRECT: 0.2 mg/dL (ref 0.0–0.3)
BILIRUBIN TOTAL: 0.7 mg/dL (ref 0.2–1.2)
Total Protein: 7 g/dL (ref 6.0–8.3)

## 2015-07-27 LAB — CBC WITH DIFFERENTIAL/PLATELET
BASOS ABS: 0.1 10*3/uL (ref 0.0–0.1)
Basophils Relative: 0.4 % (ref 0.0–3.0)
EOS ABS: 0.1 10*3/uL (ref 0.0–0.7)
Eosinophils Relative: 0.9 % (ref 0.0–5.0)
HEMATOCRIT: 45.5 % (ref 39.0–52.0)
Hemoglobin: 15.4 g/dL (ref 13.0–17.0)
LYMPHS PCT: 15.5 % (ref 12.0–46.0)
Lymphs Abs: 2.3 10*3/uL (ref 0.7–4.0)
MCHC: 33.9 g/dL (ref 30.0–36.0)
MCV: 95.1 fl (ref 78.0–100.0)
Monocytes Absolute: 1.2 10*3/uL — ABNORMAL HIGH (ref 0.1–1.0)
Monocytes Relative: 8 % (ref 3.0–12.0)
NEUTROS ABS: 11 10*3/uL — AB (ref 1.4–7.7)
NEUTROS PCT: 75.2 % (ref 43.0–77.0)
PLATELETS: 370 10*3/uL (ref 150.0–400.0)
RBC: 4.78 Mil/uL (ref 4.22–5.81)
RDW: 13.4 % (ref 11.5–15.5)
WBC: 14.7 10*3/uL — ABNORMAL HIGH (ref 4.0–10.5)

## 2015-07-27 LAB — TSH: TSH: 2.97 u[IU]/mL (ref 0.35–4.50)

## 2015-07-27 NOTE — Patient Instructions (Signed)
Monitor temperature and follow up for any fever- or any other new symptoms.

## 2015-07-27 NOTE — Progress Notes (Signed)
Subjective:    Patient ID: [name]., male    DOB: 1932/12/21, 81 y.o.   MRN: [id number]  HPI Patient seen with multiple nonspecific symptoms. He had dry cough now for about 8 weeks. Recent chest x-ray unremarkable-in September. For the past week he developed body aches initially mostly neck, shoulders, back but now also lower extremities. His cough has remained dry. He's had decreased appetite with about 10 pound weight loss over the past month. Denies any documented fever. Does have occasional chills. Denies sore throat. Occasional nausea without vomiting. No abdominal pain. He has occasional constipation which is unchanged otherwise no stool changes. No known sick contacts. Denies nasal congestion or sinusitis symptoms. No headaches. No confusion. Patient's never smoked. Rare episodes of dysphagia but this has been chronic and unchanged.  Past Medical History  Diagnosis Date  . Coronary artery disease     stent (promus) Cx/OM'09  . Osteoarthritis     right ankle  . Recurrent skin cancer   . Prostatitis, acute   . Psoriasis   . Gout   . PVC (premature ventricular contraction)     hx  . Palpitations   . Hyperlipidemia   . Hypertension   . CAD 01/16/2009  . CHEST PAIN UNSPECIFIED 11/06/2009  . GOUT 10/13/2007  . HEARING LOSS 11/05/2010  . HYPERLIPIDEMIA 10/13/2007  . HYPERTENSION 10/13/2007  . OSTEOARTHRITIS, ANKLE, RIGHT 10/13/2007  . 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 PALPITATIONS, HX OF 10/13/2007  . PREMATURE VENTRICULAR CONTRACTIONS 10/13/2007  . PROSTATITIS, ACUTE, HX OF 10/13/2007  . PSORIASIS 10/13/2007  . SKIN CANCER, RECURRENT 10/13/2007   Past Surgical History  Procedure Laterality Date  . Vasectomy    . Tonsillectomy    . Appendectomy    . Tonsillectomy    . Cyst on right shoulder    . Fracture right arm      reports that he has never smoked. He does not have any smokeless tobacco history on file. He reports that he does not drink alcohol  or use illicit drugs. family history includes Breast cancer in his sister; Heart attack (age of onset: 43) in his father; Heart attack (age of onset: 73) in an other family member; Lung cancer in an other family member; Parkinsonism (age of onset: 13) in his mother. Allergies  Allergen Reactions  . Niacin       Review of Systems  Constitutional: Positive for chills, appetite change and unexpected weight change. Negative for fever and diaphoresis.  HENT: Negative for sore throat.   Respiratory: Positive for cough. Negative for shortness of breath and wheezing.   Cardiovascular: Negative for chest pain, palpitations and leg swelling.  Gastrointestinal: Positive for nausea and constipation. Negative for vomiting, abdominal pain and diarrhea.  Genitourinary: Negative for dysuria and hematuria.  Musculoskeletal: Positive for myalgias.  Skin: Negative for rash.  Neurological: Negative for dizziness, weakness and headaches.  Hematological: Negative for adenopathy.  Psychiatric/Behavioral: Negative for dysphoric mood.       Objective:   Physical Exam  Constitutional: He appears well-developed and well-nourished.  HENT:  Right Ear: External ear normal.  Left Ear: External ear normal.  Mouth/Throat: Oropharynx is clear and moist.  Neck: Neck supple. No JVD present. No thyromegaly present.  Cardiovascular: Normal rate and regular rhythm.   Pulmonary/Chest: Effort normal. He has no wheezes.  He has some bibasilar crackles which apparently are chronic. These were auscultated recently  Abdominal: Soft. Bowel sounds are normal. He exhibits no distension  and no mass. There is no tenderness. There is no rebound and no guarding.  Musculoskeletal: He exhibits no edema.  Skin: No rash noted.  Psychiatric: He has a normal mood and affect.          Assessment & Plan:  Patient presents with nonspecific symptoms of decreased appetite, weight loss, persistent dry cough, myalgias. Does have  documented 10 pound weight loss. Recent chest x-ray unremarkable. Etiology unclear. Consider polymyalgia rheumatica in differential although he had some lower extremity myalgias as well which makes this somewhat less likely. Check further labs with CBC, sedimentation rate, TSH, chemistries. Consider pulmonary referral if cough persists.

## 2015-07-27 NOTE — Progress Notes (Signed)
Pre visit review using our clinic review tool, if applicable. No additional management support is needed unless otherwise documented below in the visit note. 

## 2015-07-31 ENCOUNTER — Telehealth: Payer: Self-pay | Admitting: *Deleted

## 2015-07-31 MED ORDER — PREDNISONE 10 MG PO TABS: ORAL_TABLET | ORAL | Status: DC

## 2015-07-31 NOTE — Telephone Encounter (Signed)
Patient walked in requesting lab results. Print out of labs given and appointment made per Dr. Erick Blinks request. In addition, Prednisone sent it and patient is aware.

## 2015-08-04 ENCOUNTER — Ambulatory Visit (INDEPENDENT_AMBULATORY_CARE_PROVIDER_SITE_OTHER): Payer: Medicare Other | Admitting: Family Medicine

## 2015-08-04 ENCOUNTER — Encounter: Payer: Self-pay | Admitting: Family Medicine

## 2015-08-04 VITALS — BP 130/84 | HR 68 | Temp 98.0°F | Wt 203.9 lb

## 2015-08-04 DIAGNOSIS — R5383 Other fatigue: Secondary | ICD-10-CM | POA: Diagnosis not present

## 2015-08-04 DIAGNOSIS — R634 Abnormal weight loss: Secondary | ICD-10-CM

## 2015-08-04 DIAGNOSIS — M791 Myalgia, unspecified site: Secondary | ICD-10-CM

## 2015-08-04 NOTE — Progress Notes (Signed)
Pre visit review using our clinic review tool, if applicable. No additional management support is needed unless otherwise documented below in the visit note. 

## 2015-08-04 NOTE — Progress Notes (Signed)
Subjective:    Patient ID: Jacob Bowens., male    DOB: 10/01/1933, 81 y.o.   MRN: [id number]  HPI Patient seen for follow-up regarding nonspecific symptoms of malaise, weight loss, and myalgias. Refer to prior note. We obtain several labs. His white count was slightly elevated 14.7 thousand. Sedimentation rate elevated at 72. We consider possible polymyalgia rheumatica. Patient was started on prednisone 20 mg daily is currently taking day 5. His myalgias have essentially resolved completely. He still has some fatigue though. No fever. Cough is improving. His weight is up 3 pounds already from last week. No dysuria. No recent headaches. He has had some improvement and appetite since starting the prednisone in his weight is up 3 more pounds today. Recent thyroid and other blood work unremarkable. These were reviewed with patient. He did have slightly low albumen 3.2.  Past Medical History  Diagnosis Date  . Coronary artery disease     stent (promus) Cx/OM'09  . Osteoarthritis     right ankle  . Recurrent skin cancer   . Prostatitis, acute   . Psoriasis   . Gout   . PVC (premature ventricular contraction)     hx  . Palpitations   . Hyperlipidemia   . Hypertension   . CAD 01/16/2009  . CHEST PAIN UNSPECIFIED 11/06/2009  . GOUT 10/13/2007  . HEARING LOSS 11/05/2010  . HYPERLIPIDEMIA 10/13/2007  . HYPERTENSION 10/13/2007  . OSTEOARTHRITIS, ANKLE, RIGHT 10/13/2007  . 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 PALPITATIONS, HX OF 10/13/2007  . PREMATURE VENTRICULAR CONTRACTIONS 10/13/2007  . PROSTATITIS, ACUTE, HX OF 10/13/2007  . PSORIASIS 10/13/2007  . SKIN CANCER, RECURRENT 10/13/2007   Past Surgical History  Procedure Laterality Date  . Vasectomy    . Tonsillectomy    . Appendectomy    . Tonsillectomy    . Cyst on right shoulder    . Fracture right arm      reports that he has never smoked. He does not have any smokeless tobacco history on file. He reports that he  does not drink alcohol or use illicit drugs. family history includes Breast cancer in his sister; Heart attack (age of onset: 6) in his father; Heart attack (age of onset: 86) in an other family member; Lung cancer in an other family member; Parkinsonism (age of onset: 19) in his mother. Allergies  Allergen Reactions  . Niacin       Review of Systems  Constitutional: Positive for fatigue. Negative for fever and chills.  HENT: Negative for trouble swallowing.   Eyes: Negative for visual disturbance.  Respiratory: Negative for shortness of breath.   Cardiovascular: Negative for chest pain, palpitations and leg swelling.  Gastrointestinal: Negative for abdominal pain.  Endocrine: Negative for polydipsia and polyuria.  Genitourinary: Negative for dysuria.  Neurological: Negative for headaches.  Psychiatric/Behavioral: Negative for confusion.       Objective:   Physical Exam  Constitutional: He appears well-developed and well-nourished.  HENT:  Mouth/Throat: Oropharynx is clear and moist.  Neck: Neck supple. No JVD present.  Cardiovascular: Normal rate and regular rhythm.   Pulmonary/Chest: Effort normal. No respiratory distress.  He has bibasilar crackles which are apparently chronic  Abdominal: Soft. There is no tenderness.  Musculoskeletal: He exhibits no edema or tenderness.          Assessment & Plan:  Patient seen recently with nonspecific symptoms of loss of appetite, weight loss, fatigue, myalgias. His myalgias have fully resolved with low-dose prednisone  which does raise suspicion of polymyalgia rheumatica. However, his fatigue is not much improved. He did have some lower extremity achiness as well which is somewhat atypical for polymyalgia. Other labs were mostly unremarkable. Follow-up next week if he has any recurrence of myalgias off prednisone. If so, consider polymyalgia and may need to be on long slow taper of prednisone. Plan follow-up in one month. Even if he is  feeling better that point will repeat white count and sedimentation rate

## 2015-08-04 NOTE — Patient Instructions (Signed)
Stop the prednisone after today IF your muscle aches return let me know and we might need to start back the prednisone. Consider  Multivitamin one daily with B12.

## 2015-08-08 ENCOUNTER — Telehealth: Payer: Self-pay | Admitting: Family Medicine

## 2015-08-08 NOTE — Telephone Encounter (Signed)
Pt was to advise if after taking  the prednisone his issue, pain in legs, joints, came back. Pt states it has come back and he would like to know what to do next. Pt states he is beginning to hurt and would like to address this as soon as possible.  please advise  [facility]/ [address]

## 2015-08-08 NOTE — Telephone Encounter (Signed)
Start back prednisone 10 mg-take 2 tablets daily for 3 days then reduce to 1-1/2 tablets daily (and stay on this dose until follow-up) and schedule office follow-up in 3-4 weeks  he will need a refill on prednisone 10 mg tablets-dispense #90

## 2015-08-08 NOTE — Telephone Encounter (Signed)
Rx was call in to the pharmacy and pt is aware

## 2015-08-10 ENCOUNTER — Other Ambulatory Visit: Payer: Self-pay | Admitting: Internal Medicine

## 2015-08-23 ENCOUNTER — Observation Stay (HOSPITAL_COMMUNITY)
Admission: EM | Admit: 2015-08-23 | Discharge: 2015-08-24 | Disposition: A | Discharge status: Home / Self Care | Payer: Medicare Other | Attending: Internal Medicine | Admitting: Internal Medicine

## 2015-08-23 ENCOUNTER — Emergency Department (HOSPITAL_COMMUNITY): Payer: Medicare Other

## 2015-08-23 ENCOUNTER — Observation Stay (HOSPITAL_COMMUNITY): Payer: Medicare Other

## 2015-08-23 ENCOUNTER — Encounter (HOSPITAL_COMMUNITY): Payer: Self-pay | Admitting: Emergency Medicine

## 2015-08-23 DIAGNOSIS — M109 Gout, unspecified: Secondary | ICD-10-CM | POA: Diagnosis not present

## 2015-08-23 DIAGNOSIS — E785 Hyperlipidemia, unspecified: Secondary | ICD-10-CM

## 2015-08-23 DIAGNOSIS — R55 Syncope and collapse: Secondary | ICD-10-CM | POA: Diagnosis not present

## 2015-08-23 DIAGNOSIS — Z7982 Long term (current) use of aspirin: Secondary | ICD-10-CM | POA: Insufficient documentation

## 2015-08-23 DIAGNOSIS — R778 Other specified abnormalities of plasma proteins: Secondary | ICD-10-CM | POA: Diagnosis present

## 2015-08-23 DIAGNOSIS — Z872 Personal history of diseases of the skin and subcutaneous tissue: Secondary | ICD-10-CM | POA: Diagnosis not present

## 2015-08-23 DIAGNOSIS — R7989 Other specified abnormal findings of blood chemistry: Secondary | ICD-10-CM | POA: Diagnosis not present

## 2015-08-23 DIAGNOSIS — D72829 Elevated white blood cell count, unspecified: Secondary | ICD-10-CM | POA: Insufficient documentation

## 2015-08-23 DIAGNOSIS — M19071 Primary osteoarthritis, right ankle and foot: Secondary | ICD-10-CM | POA: Diagnosis not present

## 2015-08-23 DIAGNOSIS — N410 Acute prostatitis: Secondary | ICD-10-CM | POA: Diagnosis not present

## 2015-08-23 DIAGNOSIS — B349 Viral infection, unspecified: Secondary | ICD-10-CM

## 2015-08-23 DIAGNOSIS — Z85828 Personal history of other malignant neoplasm of skin: Secondary | ICD-10-CM | POA: Diagnosis not present

## 2015-08-23 DIAGNOSIS — R42 Dizziness and giddiness: Secondary | ICD-10-CM | POA: Diagnosis not present

## 2015-08-23 DIAGNOSIS — I2510 Atherosclerotic heart disease of native coronary artery without angina pectoris: Secondary | ICD-10-CM | POA: Diagnosis not present

## 2015-08-23 DIAGNOSIS — R404 Transient alteration of awareness: Secondary | ICD-10-CM | POA: Diagnosis not present

## 2015-08-23 DIAGNOSIS — I951 Orthostatic hypotension: Secondary | ICD-10-CM | POA: Diagnosis present

## 2015-08-23 DIAGNOSIS — I10 Essential (primary) hypertension: Secondary | ICD-10-CM | POA: Diagnosis not present

## 2015-08-23 DIAGNOSIS — Z79899 Other long term (current) drug therapy: Secondary | ICD-10-CM | POA: Diagnosis not present

## 2015-08-23 HISTORY — DX: Fibromyalgia: M79.7

## 2015-08-23 HISTORY — DX: Gastro-esophageal reflux disease without esophagitis: K21.9

## 2015-08-23 LAB — URINALYSIS, ROUTINE W REFLEX MICROSCOPIC
GLUCOSE, UA: NEGATIVE mg/dL
Hgb urine dipstick: NEGATIVE
KETONES UR: NEGATIVE mg/dL
LEUKOCYTES UA: NEGATIVE
NITRITE: NEGATIVE
PROTEIN: NEGATIVE mg/dL
Specific Gravity, Urine: 1.019 (ref 1.005–1.030)
UROBILINOGEN UA: 1 mg/dL (ref 0.0–1.0)
pH: 7.5 (ref 5.0–8.0)

## 2015-08-23 LAB — CBC
HCT: 44 % (ref 39.0–52.0)
HEMATOCRIT: 43.3 % (ref 39.0–52.0)
Hemoglobin: 14.6 g/dL (ref 13.0–17.0)
Hemoglobin: 13.7 g/dL (ref 13.0–17.0)
MCH: 31.7 pg (ref 26.0–34.0)
MCH: 30.4 pg (ref 26.0–34.0)
MCHC: 33.2 g/dL (ref 30.0–36.0)
MCHC: 31.6 g/dL (ref 30.0–36.0)
MCV: 95.7 fL (ref 78.0–100.0)
MCV: 96.2 fL (ref 78.0–100.0)
PLATELETS: 229 10*3/uL (ref 150–400)
PLATELETS: 233 10*3/uL (ref 150–400)
RBC: 4.6 MIL/uL (ref 4.22–5.81)
RBC: 4.5 MIL/uL (ref 4.22–5.81)
RDW: 15 % (ref 11.5–15.5)
RDW: 15 % (ref 11.5–15.5)
WBC: 15.3 10*3/uL — AB (ref 4.0–10.5)
WBC: 12.8 10*3/uL — AB (ref 4.0–10.5)

## 2015-08-23 LAB — CREATININE, SERUM
Creatinine, Ser: 1.16 mg/dL (ref 0.61–1.24)
GFR, EST NON AFRICAN AMERICAN: 58 mL/min — AB (ref >60)

## 2015-08-23 LAB — BASIC METABOLIC PANEL
Anion gap: 9 (ref 5–15)
BUN: 18 mg/dL (ref 6–20)
CALCIUM: 8.7 mg/dL — AB (ref 8.9–10.3)
CHLORIDE: 100 mmol/L — AB (ref 101–111)
CO2: 28 mmol/L (ref 22–32)
CREATININE: 1.2 mg/dL (ref 0.61–1.24)
GFR calc non Af Amer: 55 mL/min — ABNORMAL LOW (ref >60)
Glucose, Bld: 103 mg/dL — ABNORMAL HIGH (ref 65–99)
Potassium: 3.9 mmol/L (ref 3.5–5.1)
SODIUM: 137 mmol/L (ref 135–145)

## 2015-08-23 LAB — TROPONIN I: TROPONIN I: 0.08 ng/mL — AB (ref <0.031)

## 2015-08-23 MED ORDER — ACETAMINOPHEN 325 MG PO TABS
650.0000 mg | ORAL_TABLET | ORAL | Status: DC | PRN
Start: 2015-08-23 — End: 2015-08-24

## 2015-08-23 MED ORDER — HEPARIN SODIUM (PORCINE) 5000 UNIT/ML IJ SOLN
5000.0000 [IU] | Freq: Three times a day (TID) | INTRAMUSCULAR | Status: DC
  Filled 2015-08-23 (×2): qty 1

## 2015-08-23 MED ORDER — ASPIRIN EC 81 MG PO TBEC
81.0000 mg | DELAYED_RELEASE_TABLET | Freq: Every day | ORAL | Status: DC
  Administered 2015-08-23 – 2015-08-24 (×2): 81 mg via ORAL
  Filled 2015-08-23 (×2): qty 1

## 2015-08-23 MED ORDER — ENSURE ENLIVE PO LIQD
237.0000 mL | Freq: Two times a day (BID) | ORAL | Status: DC
  Administered 2015-08-24: 237 mL via ORAL

## 2015-08-23 MED ORDER — ENSURE ENLIVE PO LIQD: 237.0000 mL | Freq: Two times a day (BID) | ORAL | Status: DC

## 2015-08-23 MED ORDER — ONDANSETRON HCL 4 MG/2ML IJ SOLN: 4.0000 mg | Freq: Four times a day (QID) | INTRAMUSCULAR | Status: DC | PRN

## 2015-08-23 MED ORDER — ASPIRIN 81 MG PO TABS: 81.0000 mg | ORAL_TABLET | Freq: Every day | ORAL | Status: DC

## 2015-08-23 MED ORDER — ROSUVASTATIN CALCIUM 10 MG PO TABS
10.0000 mg | ORAL_TABLET | Freq: Every day | ORAL | Status: DC
  Administered 2015-08-23: 10 mg via ORAL
  Filled 2015-08-23: qty 1

## 2015-08-23 MED ORDER — NITROGLYCERIN 0.4 MG SL SUBL: 0.4000 mg | SUBLINGUAL_TABLET | SUBLINGUAL | Status: DC | PRN

## 2015-08-23 MED ORDER — ATENOLOL 25 MG PO TABS
12.5000 mg | ORAL_TABLET | Freq: Every day | ORAL | Status: DC
  Administered 2015-08-23 – 2015-08-24 (×2): 12.5 mg via ORAL
  Filled 2015-08-23 (×2): qty 1

## 2015-08-23 MED ORDER — SODIUM CHLORIDE 0.9 % IV SOLN
INTRAVENOUS | Status: AC
  Administered 2015-08-23 (×2): via INTRAVENOUS

## 2015-08-23 MED ORDER — PANTOPRAZOLE SODIUM 40 MG PO TBEC
40.0000 mg | DELAYED_RELEASE_TABLET | Freq: Every day | ORAL | Status: DC
  Administered 2015-08-23 – 2015-08-24 (×2): 40 mg via ORAL
  Filled 2015-08-23 (×2): qty 1

## 2015-08-23 NOTE — H&P (Signed)
Cardiologist:  [name]. is an 81 y.o. male.   Chief Complaint: Pre syncope HPI:   The patient is an 81 y.o. male with a history of CAD (s/p PROMUS stent to LCx in 2009, PVCs, HTN and HLD.  He was at [place] at 0900hrs when he began to feel weak.  He had not eaten yet and decided to go out to the car.  While in the car he had dizziness that would come and go.  It would last about 5-6 seconds.  He had two episodes while sitting in the ambulance. His friend said he looked pale and clammy.  The patient denies nausea, vomiting, fever, chest pain, shortness of breath, orthopnea,  PND, cough, congestion, abdominal pain, hematochezia, melena, lower extremity edema, claudication. His CBG was 105.  Troponin 0.08. Patient deneis dysuria.  No cough  No numbness or weakness. Home Medications Medication Sig  amLODipine (NORVASC) 2.5 MG tablet Take 1 tablet (2.5 mg total) by mouth daily.  aspirin 81 MG tablet Take 81 mg by mouth daily.    atenolol (TENORMIN) 25 MG tablet TAKE ONE-HALF TABLET BY MOUTH ONCE DAILY  NITROSTAT 0.4 MG SL tablet DISSOLVE ONE TABLET UNDER THE TONGUE EVERY 5 MINUTES AS NEEDED  omeprazole (PRILOSEC) 20 MG capsule TAKE 1 CAPSULE DAILY  rosuvastatin (CRESTOR) 10 MG tablet TAKE 1 TABLET DAILY PRIOR TO BEDTIME  triamterene-hydrochlorothiazide (MAXZIDE-25) 37.5-25 MG per tablet TAKE ONE TABLET BY MOUTH ONCE DAILY  predniSONE (DELTASONE) 10 MG tablet Two by mouth once daily Patient not taking: Reported on 08/23/2015     Past Medical History  Diagnosis Date  . Coronary artery disease     stent (promus) Cx/OM'09  . Osteoarthritis     right ankle  . Recurrent skin cancer   . Prostatitis, acute   . Psoriasis   . Gout   . PVC (premature ventricular contraction)     hx  . Palpitations   . Hyperlipidemia   . Hypertension   . CAD 01/16/2009  . CHEST PAIN UNSPECIFIED 11/06/2009  . GOUT 10/13/2007  . HEARING LOSS 11/05/2010  . HYPERLIPIDEMIA 10/13/2007  .  HYPERTENSION 10/13/2007  . OSTEOARTHRITIS, ANKLE, RIGHT 10/13/2007  . 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 PALPITATIONS, HX OF 10/13/2007  . PREMATURE VENTRICULAR CONTRACTIONS 10/13/2007  . PROSTATITIS, ACUTE, HX OF 10/13/2007  . PSORIASIS 10/13/2007  . SKIN CANCER, RECURRENT 10/13/2007    Past Surgical History  Procedure Laterality Date  . Vasectomy    . Tonsillectomy    . Appendectomy    . Tonsillectomy    . Cyst on right shoulder    . Fracture right arm      Family History  Problem Relation Age of Onset  . Heart attack Father 22    died  . Heart attack  50    paternal uncle died/maternal aunt died  . Parkinsonism Mother 46    died  . Breast cancer Sister     died  . Lung cancer      uncle died   Social History:  reports that he has never smoked. He does not have any smokeless tobacco history on file. He reports that he does not drink alcohol or use illicit drugs.  Allergies:  Allergies  Allergen Reactions  . Niacin      (Not in a hospital admission)  Results for orders placed or performed during the hospital encounter of 08/23/15 (from the past 48 hour(s))  Basic metabolic panel  Status: Abnormal   Collection Time: 08/23/15 11:09 AM  Result Value Ref Range   Sodium 137 135 - 145 mmol/L   Potassium 3.9 3.5 - 5.1 mmol/L   Chloride 100 (L) 101 - 111 mmol/L   CO2 28 22 - 32 mmol/L   Glucose, Bld 103 (H) 65 - 99 mg/dL   BUN 18 6 - 20 mg/dL   Creatinine, Ser 1.20 0.61 - 1.24 mg/dL   Calcium 8.7 (L) 8.9 - 10.3 mg/dL   GFR calc non Af Amer 55 (L) >60 mL/min   GFR calc Af Amer >60 >60 mL/min    Comment: (NOTE) The eGFR has been calculated using the CKD EPI equation. This calculation has not been validated in all clinical situations. eGFR's persistently <60 mL/min signify possible Chronic Kidney Disease.    Anion gap 9 5 - 15  CBC     Status: Abnormal   Collection Time: 08/23/15 11:09 AM  Result Value Ref Range   WBC 15.3 (H) 4.0 - 10.5 K/uL    RBC 4.60 4.22 - 5.81 MIL/uL   Hemoglobin 14.6 13.0 - 17.0 g/dL   HCT 44.0 39.0 - 52.0 %   MCV 95.7 78.0 - 100.0 fL   MCH 31.7 26.0 - 34.0 pg   MCHC 33.2 30.0 - 36.0 g/dL   RDW 15.0 11.5 - 15.5 %   Platelets 229 150 - 400 K/uL  Troponin I     Status: Abnormal   Collection Time: 08/23/15 11:09 AM  Result Value Ref Range   Troponin I 0.08 (H) <0.031 ng/mL    Comment:        PERSISTENTLY INCREASED TROPONIN VALUES IN THE RANGE OF 0.04-0.49 ng/mL CAN BE SEEN IN:       -UNSTABLE ANGINA       -CONGESTIVE HEART FAILURE       -MYOCARDITIS       -CHEST TRAUMA       -ARRYHTHMIAS       -LATE PRESENTING MYOCARDIAL INFARCTION       -COPD   CLINICAL FOLLOW-UP RECOMMENDED.    No results found.  Review of Systems  All other systems reviewed and are negative. The patient denied nausea, vomiting, fever, chest pain, shortness of breath, orthopnea, , PND, cough, congestion, abdominal pain, hematochezia, melena, lower extremity edema, claudication.   Blood pressure 114/59, pulse 55, resp. rate 10, SpO2 99 %. Physical Exam  Nursing note and vitals reviewed. Well nourished, well developed, in no acute distress but does appear weak and washed-out HEENT: Pupils are equal round react to light accommodation extraocular movements are intact.  Neck: no JVDNo cervical lymphadenopathy.  No carotid bruits Cardiac: Regular rate and rhythm without murmurs rubs or gallops. Lungs:  clear to auscultation bilaterally, no wheezing, rhonchi or rales Abd: soft, nontender, positive bowel sounds all quadrants, no hepatosplenomegaly Ext: no lower extremity edema.  2+ radial and dorsalis pedis pulses. Skin: Erythemaous leasions on face, mildly on hands.   Neuro:  Grossly normal _0 EKG:  SB 58 bpm  RBBB.  RPFB   Assessment/Plan Principal Problem:   Pre-syncope Active Problems:   Hyperlipidemia   Essential hypertension   Coronary atherosclerosis   Elevated troponin  81 y.o. male with a history of CAD (s/p  PROMUS stent to LCx in 2009, PVCs, HTN and HLD.  He began to feel weak and dizzy.  The dizziness would come and go and lasted about 5-6 seconds each time.  Since being in the ER he has not experienced  any episodes.  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  Some mild hypotension with EMS. Troponin is 0.08 and needs to be cycled.  Order Echocardiogram.  I recommend admitting him and monitoring on telemetry.  However, the patient says he needs to go home so his wife is not alone.  WBCs are elevated from prednisone.  SBP dropped 71mHg sitting to standing.   HTarri Fuller PNew Columbia10/26/2016, 1:24 PM  Patient seen and examined  I am very familiar with this pt as I have followed him for years in clinic.  Episodes of dizziness today  No frank syncope Here in ER BP drops and HR increases with standing.   Pt appears fatigued, he does not appear himself  SMargorie Johnsays his speech was sl slurred  I don't appreciate any focal findings.    Labs signif for trop 0.08  Also WESR elevated at 72 in clinic  (? PMR).    Plan:  Admit Cycle troponins. Hydrate  REcheck orhtostasis  Check head CT COntinue tele  EKG is unchanged in conduction delays.  2.  CAD  I a not convinced of active ischemia  Follow  3.  HTN  Follow  4.  HL  Continue statin.  PDorris Carnes

## 2015-08-23 NOTE — ED Provider Notes (Signed)
CSN: [id number]     Arrival date & time 08/23/15  8032 History   First MD Initiated Contact with Patient 08/23/15 [phone]     Chief Complaint  Patient presents with  . Near Syncope     (Consider location/radiation/quality/duration/timing/severity/associated sxs/prior Treatment) HPI Comments: 81 -year-old male who had an episode of near-syncope while standing in line at [employer].  He had not eaten breakfast or had anything to drink this morning.  He had no chest pain or shortness of breath.  He did not pass out.  He went to sit down in his car.  Then a bystander called EMS.  He began to feel better after lying down in the ambulance.  Now he has no symptoms.   Patient is a 81 y.o. male presenting with near-syncope.  Near Syncope This is a new problem. The current episode started less than 1 hour ago. The problem occurs constantly. The problem has been resolved. Pertinent negatives include no chest pain. Associated symptoms comments: Generalized weakness and diaphoresis.. Nothing aggravates the symptoms. Nothing relieves the symptoms.    Past Medical History  Diagnosis Date  . Coronary artery disease     stent (promus) Cx/OM'09  . Osteoarthritis     right ankle  . Recurrent skin cancer   . Prostatitis, acute   . Psoriasis   . Gout   . PVC (premature ventricular contraction)     hx  . Palpitations   . Hyperlipidemia   . Hypertension   . CAD 01/16/2009  . CHEST PAIN UNSPECIFIED 11/06/2009  . GOUT 10/13/2007  . HEARING LOSS 11/05/2010  . HYPERLIPIDEMIA 10/13/2007  . HYPERTENSION 10/13/2007  . OSTEOARTHRITIS, ANKLE, RIGHT 10/13/2007  . 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 PALPITATIONS, HX OF 10/13/2007  . PREMATURE VENTRICULAR CONTRACTIONS 10/13/2007  . PROSTATITIS, ACUTE, HX OF 10/13/2007  . PSORIASIS 10/13/2007  . SKIN CANCER, RECURRENT 10/13/2007   Past Surgical History  Procedure Laterality Date  . Vasectomy    . Tonsillectomy    . Appendectomy    . Tonsillectomy     . Cyst on right shoulder    . Fracture right arm     Family History  Problem Relation Age of Onset  . Heart attack Father 23    died  . Heart attack  50    paternal uncle died/maternal aunt died  . Parkinsonism Mother 13    died  . Breast cancer Sister     died  . Lung cancer      uncle died   Social History  Substance Use Topics  . Smoking status: Never Smoker   . Smokeless tobacco: None  . Alcohol Use: No    Review of Systems  Cardiovascular: Positive for near-syncope. Negative for chest pain.  All other systems reviewed and are negative.     Allergies  Niacin  Home Medications   Prior to Admission medications   Medication Sig Start Date End Date Taking? Authorizing Provider  amLODipine (NORVASC) 2.5 MG tablet Take 1 tablet (2.5 mg total) by mouth daily. 08/17/14  Yes Fay Records, MD  aspirin 81 MG tablet Take 81 mg by mouth daily.     Yes Historical Provider, MD  atenolol (TENORMIN) 25 MG tablet TAKE ONE-HALF TABLET BY MOUTH ONCE DAILY 07/19/15  Yes Fay Records, MD  NITROSTAT 0.4 MG SL tablet DISSOLVE ONE TABLET UNDER THE TONGUE EVERY 5 MINUTES AS NEEDED 08/01/14  Yes Fay Records, MD  omeprazole (PRILOSEC) 20 MG capsule  TAKE 1 CAPSULE DAILY 07/27/15  Yes Eulas Post, MD  rosuvastatin (CRESTOR) 10 MG tablet TAKE 1 TABLET DAILY PRIOR TO BEDTIME 08/10/15  Yes Fay Records, MD  triamterene-hydrochlorothiazide (MAXZIDE-25) 37.5-25 MG per tablet TAKE ONE TABLET BY MOUTH ONCE DAILY 05/23/15  Yes Fay Records, MD  predniSONE (DELTASONE) 10 MG tablet Two by mouth once daily Patient not taking: Reported on 08/23/2015 07/31/15   Eulas Post, MD   BP 116/56 mmHg  Pulse 57  Resp 18  SpO2 98% Physical Exam  Constitutional: He is oriented to person, place, and time. He appears well-developed and well-nourished. No distress.  HENT:  Head: Normocephalic and atraumatic.  Mouth/Throat: Oropharynx is clear and moist.  Eyes: Conjunctivae are normal. Pupils are equal,  round, and reactive to light. No scleral icterus.  Neck: Neck supple.  Cardiovascular: Normal rate, regular rhythm, normal heart sounds and intact distal pulses.   No murmur heard. Pulmonary/Chest: Effort normal and breath sounds normal. No stridor. No respiratory distress. He has no wheezes. He has no rales.  Abdominal: Soft. He exhibits no distension. There is no tenderness.  Musculoskeletal: Normal range of motion. He exhibits no edema.  Neurological: He is alert and oriented to person, place, and time.  Skin: Skin is warm and dry. No rash noted.  Psychiatric: He has a normal mood and affect. His behavior is normal.  Nursing note and vitals reviewed.   ED Course  Procedures (including critical care time) Labs Review Labs Reviewed  BASIC METABOLIC PANEL - Abnormal; Notable for the following:    Chloride 100 (*)    Glucose, Bld 103 (*)    Calcium 8.7 (*)    GFR calc non Af Amer 55 (*)    All other components within normal limits  CBC - Abnormal; Notable for the following:    WBC 15.3 (*)    All other components within normal limits  TROPONIN I - Abnormal; Notable for the following:    Troponin I 0.08 (*)    All other components within normal limits  URINALYSIS, ROUTINE W REFLEX MICROSCOPIC (NOT AT [facility]) - Abnormal; Notable for the following:    Color, Urine AMBER (*)    Bilirubin Urine SMALL (*)    All other components within normal limits  CBC - Abnormal; Notable for the following:    WBC 12.8 (*)    All other components within normal limits  CREATININE, SERUM - Abnormal; Notable for the following:    GFR calc non Af Amer 58 (*)    All other components within normal limits  TROPONIN I  TROPONIN I  TROPONIN I  BASIC METABOLIC PANEL  LIPID PANEL  SEDIMENTATION RATE    Imaging Review Dg Chest 2 View  08/23/2015  CLINICAL DATA:  Near syncopal episode this morning.  Leukocytosis. EXAM: CHEST  2 VIEW COMPARISON:  07/13/2015 FINDINGS: The heart size and mediastinal  contours are within normal limits. Mild bibasilar scarring is stable. No evidence of pulmonary infiltrate or edema. No evidence of pneumothorax or pleural effusion. The visualized skeletal structures are unremarkable. IMPRESSION: Stable mild bibasilar scarring.  No active disease. Electronically Signed   By: Earle Gell M.D.   On: 08/23/2015 19:53   I have personally reviewed and evaluated these images and lab results as part of my medical decision-making.   EKG Interpretation   Date/Time:  Wednesday August 23 2015 09:56:49 EDT Ventricular Rate:  58 PR Interval:  135 QRS Duration: 150 QT Interval:  457  QTC Calculation: 449 R Axis:   -63 Text Interpretation:  Sinus rhythm RBBB and LAFB Probable left ventricular  hypertrophy similar to EKG on 05-Sep-2014 Confirmed by [facility]  MD, TREY  ([id number]) on 08/23/2015 8:51:06 PM      MDM   Final diagnoses:  Dizziness  Leukocytosis    81 yo male with hx of CAD presenting after an episode of near syncope while at a fast food restaurant.  No CP or SOB during episode.  However, troponin slightly elevated.  Consulted Cardiology, who will obs overnight.     Serita Grit, MD 08/23/15 2052

## 2015-08-23 NOTE — ED Notes (Signed)
Pt to ED via [facility] with complaint of near syncope. Pt was at [employer], was not able to eat before suddenly feeling dizziness and as if he was going to pass out. Pt CBG 105, not diabetic. Pt has hx of stent placement and per EMS shows RBBB on 12 lead. Pt on arrival reports relief of symptoms. Per EMS pt did have a drop in systolic BP while in route from 372 systolic to 90 systolic. Pt is a/o x4.

## 2015-08-24 ENCOUNTER — Observation Stay (HOSPITAL_BASED_OUTPATIENT_CLINIC_OR_DEPARTMENT_OTHER): Payer: Medicare Other

## 2015-08-24 DIAGNOSIS — I2510 Atherosclerotic heart disease of native coronary artery without angina pectoris: Secondary | ICD-10-CM | POA: Diagnosis not present

## 2015-08-24 DIAGNOSIS — I10 Essential (primary) hypertension: Secondary | ICD-10-CM | POA: Diagnosis not present

## 2015-08-24 DIAGNOSIS — D72829 Elevated white blood cell count, unspecified: Secondary | ICD-10-CM | POA: Diagnosis not present

## 2015-08-24 DIAGNOSIS — R55 Syncope and collapse: Secondary | ICD-10-CM | POA: Diagnosis not present

## 2015-08-24 DIAGNOSIS — R7989 Other specified abnormal findings of blood chemistry: Secondary | ICD-10-CM

## 2015-08-24 DIAGNOSIS — R42 Dizziness and giddiness: Secondary | ICD-10-CM | POA: Diagnosis not present

## 2015-08-24 DIAGNOSIS — I951 Orthostatic hypotension: Secondary | ICD-10-CM | POA: Diagnosis present

## 2015-08-24 DIAGNOSIS — B349 Viral infection, unspecified: Secondary | ICD-10-CM

## 2015-08-24 DIAGNOSIS — M19071 Primary osteoarthritis, right ankle and foot: Secondary | ICD-10-CM | POA: Diagnosis not present

## 2015-08-24 LAB — BASIC METABOLIC PANEL
ANION GAP: 7 (ref 5–15)
BUN: 19 mg/dL (ref 6–20)
CHLORIDE: 99 mmol/L — AB (ref 101–111)
CO2: 28 mmol/L (ref 22–32)
Calcium: 8 mg/dL — ABNORMAL LOW (ref 8.9–10.3)
Creatinine, Ser: 1.18 mg/dL (ref 0.61–1.24)
GFR calc Af Amer: >60 mL/min (ref >60)
GFR calc non Af Amer: 56 mL/min — ABNORMAL LOW (ref >60)
Glucose, Bld: 106 mg/dL — ABNORMAL HIGH (ref 65–99)
POTASSIUM: 3.8 mmol/L (ref 3.5–5.1)
SODIUM: 134 mmol/L — AB (ref 135–145)

## 2015-08-24 LAB — SEDIMENTATION RATE: SED RATE: 22 mm/h — AB (ref 0–16)

## 2015-08-24 LAB — LIPID PANEL
CHOL/HDL RATIO: 3.1 ratio
CHOLESTEROL: 92 mg/dL (ref 0–200)
HDL: 30 mg/dL — AB (ref >40)
LDL Cholesterol: 45 mg/dL (ref 0–99)
TRIGLYCERIDES: 87 mg/dL (ref <150)
VLDL: 17 mg/dL (ref 0–40)

## 2015-08-24 LAB — TROPONIN I: Troponin I: <0.03 ng/mL (ref <0.031)

## 2015-08-24 MED ORDER — ACETAMINOPHEN 325 MG PO TABS: 650.0000 mg | ORAL_TABLET | ORAL | Status: DC | PRN

## 2015-08-24 MED ORDER — ENSURE ENLIVE PO LIQD: 237.0000 mL | Freq: Two times a day (BID) | ORAL | Status: DC

## 2015-08-24 NOTE — Progress Notes (Signed)
Subjective: No CP  NO SOB  Objective: Filed Vitals:   08/23/15 1653 08/23/15 2139 08/23/15 2321 08/24/15 0542  BP: 108/82 118/61  114/61  Pulse: 70 66  66  Temp: 99.2 F (37.3 C) 100.5 F (38.1 C) 99.6 F (37.6 C) 99.1 F (37.3 C)  TempSrc: Oral Oral Oral Oral  Resp:      Height: 6' (1.829 m)     Weight: 197 lb 14.4 oz (89.767 kg)     SpO2: 100% 99%  99%   Weight change:   Intake/Output Summary (Last 24 hours) at 08/24/15 0741 Last data filed at 08/24/15 0223  Gross per 24 hour  Intake   1260 ml  Output    650 ml  Net    610 ml    General: Alert, awake, oriented x3, in no acute distress Neck:  JVP is normal Heart: Regular rate and rhythm, without murmurs, rubs, gallops.  Lungs: Clear to auscultation.  No rales or wheezes. Exemities:  No edema.   Neuro: Grossly intact, nonfocal.  TEle  SR    Lab Results: Results for orders placed or performed during the hospital encounter of 08/23/15 (from the past 24 hour(s))  Basic metabolic panel     Status: Abnormal   Collection Time: 08/23/15 11:09 AM  Result Value Ref Range   Sodium 137 135 - 145 mmol/L   Potassium 3.9 3.5 - 5.1 mmol/L   Chloride 100 (L) 101 - 111 mmol/L   CO2 28 22 - 32 mmol/L   Glucose, Bld 103 (H) 65 - 99 mg/dL   BUN 18 6 - 20 mg/dL   Creatinine, Ser 1.20 0.61 - 1.24 mg/dL   Calcium 8.7 (L) 8.9 - 10.3 mg/dL   GFR calc non Af Amer 55 (L) >60 mL/min   GFR calc Af Amer >60 >60 mL/min   Anion gap 9 5 - 15  CBC     Status: Abnormal   Collection Time: 08/23/15 11:09 AM  Result Value Ref Range   WBC 15.3 (H) 4.0 - 10.5 K/uL   RBC 4.60 4.22 - 5.81 MIL/uL   Hemoglobin 14.6 13.0 - 17.0 g/dL   HCT 44.0 39.0 - 52.0 %   MCV 95.7 78.0 - 100.0 fL   MCH 31.7 26.0 - 34.0 pg   MCHC 33.2 30.0 - 36.0 g/dL   RDW 15.0 11.5 - 15.5 %   Platelets 229 150 - 400 K/uL  Troponin I     Status: Abnormal   Collection Time: 08/23/15 11:09 AM  Result Value Ref Range   Troponin I 0.08 (H) <0.031 ng/mL  Urinalysis,  Routine w reflex microscopic (not at [facility])     Status: Abnormal   Collection Time: 08/23/15  1:02 PM  Result Value Ref Range   Color, Urine AMBER (A) YELLOW   APPearance CLEAR CLEAR   Specific Gravity, Urine 1.019 1.005 - 1.030   pH 7.5 5.0 - 8.0   Glucose, UA NEGATIVE NEGATIVE mg/dL   Hgb urine dipstick NEGATIVE NEGATIVE   Bilirubin Urine SMALL (A) NEGATIVE   Ketones, ur NEGATIVE NEGATIVE mg/dL   Protein, ur NEGATIVE NEGATIVE mg/dL   Urobilinogen, UA 1.0 0.0 - 1.0 mg/dL   Nitrite NEGATIVE NEGATIVE   Leukocytes, UA NEGATIVE NEGATIVE  Troponin I     Status: None   Collection Time: 08/23/15  5:57 PM  Result Value Ref Range   Troponin I <0.03 <0.031 ng/mL  CBC     Status: Abnormal   Collection Time: 08/23/15  5:57 PM  Result Value Ref Range   WBC 12.8 (H) 4.0 - 10.5 K/uL   RBC 4.50 4.22 - 5.81 MIL/uL   Hemoglobin 13.7 13.0 - 17.0 g/dL   HCT 43.3 39.0 - 52.0 %   MCV 96.2 78.0 - 100.0 fL   MCH 30.4 26.0 - 34.0 pg   MCHC 31.6 30.0 - 36.0 g/dL   RDW 15.0 11.5 - 15.5 %   Platelets 233 150 - 400 K/uL  Creatinine, serum     Status: Abnormal   Collection Time: 08/23/15  5:57 PM  Result Value Ref Range   Creatinine, Ser 1.16 0.61 - 1.24 mg/dL   GFR calc non Af Amer 58 (L) >60 mL/min   GFR calc Af Amer >60 >60 mL/min  Troponin I     Status: None   Collection Time: 08/23/15 10:50 PM  Result Value Ref Range   Troponin I <0.03 <0.031 ng/mL    Studies/Results: Dg Chest 2 View  08/23/2015  CLINICAL DATA:  Near syncopal episode this morning.  Leukocytosis. EXAM: CHEST  2 VIEW COMPARISON:  07/13/2015 FINDINGS: The heart size and mediastinal contours are within normal limits. Mild bibasilar scarring is stable. No evidence of pulmonary infiltrate or edema. No evidence of pneumothorax or pleural effusion. The visualized skeletal structures are unremarkable. IMPRESSION: Stable mild bibasilar scarring.  No active disease. Electronically Signed   By: Earle Gell M.D.   On: 08/23/2015 19:53   Ct  Head Wo Contrast  08/23/2015  CLINICAL DATA:  81 year old male with dizziness. EXAM: CT HEAD WITHOUT CONTRAST TECHNIQUE: Contiguous axial images were obtained from the base of the skull through the vertex without intravenous contrast. COMPARISON:  11/23/2010 CT FINDINGS: Moderate atrophy and mild chronic small-vessel white matter ischemic changes again noted. No acute intracranial abnormalities are identified, including mass lesion or mass effect, hydrocephalus, extra-axial fluid collection, midline shift, hemorrhage, or acute infarction. The visualized bony calvarium is unremarkable. IMPRESSION: No evidence of acute intracranial abnormality. Moderate atrophy and mild chronic small-vessel white matter ischemic changes. Electronically Signed   By: Margarette Canada M.D.   On: 08/23/2015 15:19    Medications: REviewed   @[email or web address] @    Dorris Carnes 08/24/2015, 7:41 AM  1.  Dizziness  Pt clinicially improved  Did get fluids yesterday  Labs CXR CT neg Would ambulate  If feels OK then d/c home Temp is up minimally at 100.5  No obvious infection.  Follow    2.  CAD  I am not convinced of active ischemia  3  HTN  Controlled.

## 2015-08-24 NOTE — Progress Notes (Signed)
Subjective: No complaints   Objective: Vital signs in last 24 hours: Temp:  [99.1 F (37.3 C)-100.5 F (38.1 C)] 99.1 F (37.3 C) (10/27 0542) Pulse Rate:  [55-70] 66 (10/27 0542) Resp:  [10-18] 17 (10/26 1445) BP: (106-129)/(55-82) 114/61 mmHg (10/27 0542) SpO2:  [98 %-100 %] 99 % (10/27 0542) Weight:  [197 lb 14.4 oz (89.767 kg)] 197 lb 14.4 oz (89.767 kg) (10/26 1653) Weight change:  Last BM Date: 08/22/15 Intake/Output from previous day: 10/26 0701 - 10/27 0700 In: 1260 [P.O.:360; I.V.:900] Out: 650 [Urine:650] Intake/Output this shift: Total I/O In: -  Out: 200 [Urine:200]  PE: PER MD General:Pleasant affect, NAD Skin:Warm and dry, brisk capillary refill HEENT:normocephalic, sclera clear, mucus membranes moist Neck:supple, no JVD, no bruits  Heart:S1S2 RRR without murmur, gallup, rub or click Lungs:clear without rales, rhonchi, or wheezes AYT:KZSW, non tender, + BS, do not palpate liver spleen or masses Ext:no lower ext edema, 2+ pedal pulses, 2+ radial pulses Neuro:alert and oriented, MAE, follows commands, + facial symmetry Tele:  SR  Lab Results:  Recent Labs  08/23/15 1109 08/23/15 1757  WBC 15.3* 12.8*  HGB 14.6 13.7  HCT 44.0 43.3  PLT 229 233   BMET  Recent Labs  08/23/15 1109 08/23/15 1757 08/24/15 0653  NA 137  --  134*  K 3.9  --  3.8  CL 100*  --  99*  CO2 28  --  28  GLUCOSE 103*  --  106*  BUN 18  --  19  CREATININE 1.20 1.16 1.18  CALCIUM 8.7*  --  8.0*    Recent Labs  08/23/15 2250 08/24/15 0653  TROPONINI <0.03 <0.03    Lab Results  Component Value Date   CHOL 92 08/24/2015   HDL 30* 08/24/2015   LDLCALC 45 08/24/2015   LDLDIRECT 128.0 11/16/2007   TRIG 87 08/24/2015   CHOLHDL 3.1 08/24/2015   No results found for: HGBA1C   Lab Results  Component Value Date   TSH 2.97 07/27/2015    Hepatic Function Panel No results for input(s): PROT, ALBUMIN, AST, ALT, ALKPHOS, BILITOT, BILIDIR, IBILI in the  last 72 hours.  Recent Labs  08/24/15 0653  CHOL 92   No results for input(s): PROTIME in the last 72 hours.     Studies/Results: Dg Chest 2 View  08/23/2015  CLINICAL DATA:  Near syncopal episode this morning.  Leukocytosis. EXAM: CHEST  2 VIEW COMPARISON:  07/13/2015 FINDINGS: The heart size and mediastinal contours are within normal limits. Mild bibasilar scarring is stable. No evidence of pulmonary infiltrate or edema. No evidence of pneumothorax or pleural effusion. The visualized skeletal structures are unremarkable. IMPRESSION: Stable mild bibasilar scarring.  No active disease. Electronically Signed   By: Earle Gell M.D.   On: 08/23/2015 19:53   Ct Head Wo Contrast  08/23/2015  CLINICAL DATA:  81 year old male with dizziness. EXAM: CT HEAD WITHOUT CONTRAST TECHNIQUE: Contiguous axial images were obtained from the base of the skull through the vertex without intravenous contrast. COMPARISON:  11/23/2010 CT FINDINGS: Moderate atrophy and mild chronic small-vessel white matter ischemic changes again noted. No acute intracranial abnormalities are identified, including mass lesion or mass effect, hydrocephalus, extra-axial fluid collection, midline shift, hemorrhage, or acute infarction. The visualized bony calvarium is unremarkable. IMPRESSION: No evidence of acute intracranial abnormality. Moderate atrophy and mild chronic small-vessel white matter ischemic changes. Electronically Signed   By: Margarette Canada M.D.   On: 08/23/2015  15:19    Medications: I have reviewed the patient's current medications. Scheduled Meds: . aspirin EC  81 mg Oral Daily  . atenolol  12.5 mg Oral Daily  . feeding supplement (ENSURE ENLIVE)  237 mL Oral BID BM  . heparin  5,000 Units Subcutaneous 3 times per day  . pantoprazole  40 mg Oral Daily  . rosuvastatin  10 mg Oral q1800   Continuous Infusions:  PRN Meds:.acetaminophen, nitroGLYCERIN, ondansetron (ZOFRAN) IV  Assessment/Plan: Principal  Problem:   Pre-syncope- dizziness with paleness -in ER with BP dropping. Pt admitted and IV fluids given.  Orthostatic checks this AM with drop of systolic bp from 841 to 660.  Improved from yesterday.        CT head without acute process  Active Problems:   Fever- up to 100.5--urine without infection/CXR stable no infiltrate   Follow  No sx of infection   Hyperlipidemia   Essential hypertension controlled to low.   Coronary atherosclerosis  No complaints of CP   Elevated troponin- initially but 3 follow up have been negative.         Time spent with pt. :15 minutes. [facility]  Nurse Practitioner Certified Pager [phone] or after 5pm and on weekends call [phone] 08/24/2015, 8:11 AM  Pt seen and examined  See accompanying note

## 2015-08-24 NOTE — Discharge Instructions (Signed)
Hold your triamterene-hctz for 2 days and if you feel better may resume.  Call the office if further problems.  Heart Healthy Diet.Syncope Syncope is a medical term for fainting or passing out. This means you lose consciousness and drop to the ground. People are generally unconscious for less than 5 minutes. You may have some muscle twitches for up to 15 seconds before waking up and returning to normal. Syncope occurs more often in older adults, but it can happen to anyone. While most causes of syncope are not dangerous, syncope can be a sign of a serious medical problem. It is important to seek medical care.  CAUSES  Syncope is caused by a sudden drop in blood flow to the brain. The specific cause is often not determined. Factors that can bring on syncope include:  Taking medicines that lower blood pressure.  Sudden changes in posture, such as standing up quickly.  Taking more medicine than prescribed.  Standing in one place for too long.  Seizure disorders.  Dehydration and excessive exposure to heat.  Low blood sugar (hypoglycemia).  Straining to have a bowel movement.  Heart disease, irregular heartbeat, or other circulatory problems.  Fear, emotional distress, seeing blood, or severe pain. SYMPTOMS  Right before fainting, you may:  Feel dizzy or light-headed.  Feel nauseous.  See all white or all black in your field of vision.  Have cold, clammy skin. DIAGNOSIS  Your health care provider will ask about your symptoms, perform a physical exam, and perform an electrocardiogram (ECG) to record the electrical activity of your heart. Your health care provider may also perform other heart or blood tests to determine the cause of your syncope which may include:  Transthoracic echocardiogram (TTE). During echocardiography, sound waves are used to evaluate how blood flows through your heart.  Transesophageal echocardiogram (TEE).  Cardiac monitoring. This allows your health  care provider to monitor your heart rate and rhythm in real time.  Holter monitor. This is a portable device that records your heartbeat and can help diagnose heart arrhythmias. It allows your health care provider to track your heart activity for several days, if needed.  Stress tests by exercise or by giving medicine that makes the heart beat faster. TREATMENT  In most cases, no treatment is needed. Depending on the cause of your syncope, your health care provider may recommend changing or stopping some of your medicines. HOME CARE INSTRUCTIONS  Have someone stay with you until you feel stable.  Do not drive, use machinery, or play sports until your health care provider says it is okay.  Keep all follow-up appointments as directed by your health care provider.  Lie down right away if you start feeling like you might faint. Breathe deeply and steadily. Wait until all the symptoms have passed.  Drink enough fluids to keep your urine clear or pale yellow.  If you are taking blood pressure or heart medicine, get up slowly and take several minutes to sit and then stand. This can reduce dizziness. SEEK IMMEDIATE MEDICAL CARE IF:   You have a severe headache.  You have unusual pain in the chest, abdomen, or back.  You are bleeding from your mouth or rectum, or you have black or tarry stool.  You have an irregular or very fast heartbeat.  You have pain with breathing.  You have repeated fainting or seizure-like jerking during an episode.  You faint when sitting or lying down.  You have confusion.  You have trouble walking.  You have severe weakness.  You have vision problems. If you fainted, call your local emergency services (911 in [place] Do not drive yourself to the hospital.    This information is not intended to replace advice given to you by your health care provider. Make sure you discuss any questions you have with your health care provider.   Document Released:  10/14/2005 Document Revised: 02/28/2015 Document Reviewed: 12/13/2011 Elsevier Interactive Patient Education [employer].

## 2015-08-24 NOTE — Discharge Summary (Signed)
Physician Discharge Summary       Patient ID: [name]. MRN: [id number] DOB/AGE: 1933-01-30 81 y.o.  Admit date: 08/23/2015 Discharge date: 08/24/2015 Primary Cardiologist:Dr. Harrington Challenger   Discharge Diagnoses:  Principal Problem:   Pre-syncope Active Problems:   Orthostatic hypotension   Hyperlipidemia   Essential hypertension   Coronary atherosclerosis   Elevated troponin   Viral syndrome   Discharged Condition: good  Procedures: none  Hospital Course:  81 year old male with a history of CAD (s/p PROMUS stent to LCx in 2009, PVCs, HTN and HLD. He was at [facility] 0900hrs 08/23/15 when he began to feel weak. He had not eaten yet and decided to go out to the car. While in the car he had dizziness that would come and go. It would last about 5-6 seconds. He had two episodes while sitting in the ambulance. His friend said he looked pale and clammy.  His first troponin was 0.08 but all others were negative.  He was orthostatic in ER  With BP drop systolic from 237 to 98.  He was admitted and given IV fluids.    By the next AM he was seen and evaluated by Dr. Harrington Challenger.  He has low grade fever but u/a and CXR were negative.  Most likely viral syndrome for fever.  The near syncope/dizziness due to hypotension.  IV fluids helped.  We held his diuretic for 2 more days.  Dr. Harrington Challenger found pt stable for discharge she did not find any ischemia.  Pt ambulated in hall without problem before discharge.       Consults: None  Significant Diagnostic Studies:  BMP Latest Ref Rng 08/24/2015 08/23/2015 08/23/2015  Glucose 65 - 99 mg/dL 106(H) - 103(H)  BUN 6 - 20 mg/dL 19 - 18  Creatinine 0.61 - 1.24 mg/dL 1.18 1.16 1.20  Sodium 135 - 145 mmol/L 134(L) - 137  Potassium 3.5 - 5.1 mmol/L 3.8 - 3.9  Chloride 101 - 111 mmol/L 99(L) - 100(L)  CO2 22 - 32 mmol/L 28 - 28  Calcium 8.9 - 10.3 mg/dL 8.0(L) - 8.7(L)   CBC Latest Ref Rng 08/23/2015 08/23/2015 07/27/2015  WBC 4.0 - 10.5 K/uL 12.8(H)  15.3(H) 14.7(H)  Hemoglobin 13.0 - 17.0 g/dL 13.7 14.6 15.4  Hematocrit 39.0 - 52.0 % 43.3 44.0 45.5  Platelets 150 - 400 K/uL 233 229 370.0    Troponin 0.08 to <0.03 X 3  Lipid Panel     Component Value Date/Time   CHOL 92 08/24/2015 0653   TRIG 87 08/24/2015 0653   HDL 30* 08/24/2015 0653   CHOLHDL 3.1 08/24/2015 0653   VLDL 17 08/24/2015 0653   LDLCALC 45 08/24/2015 0653   LDLDIRECT 128.0 11/16/2007 1454   Sed rate 22  CT Head: IMPRESSION: No evidence of acute intracranial abnormality.  Moderate atrophy and mild chronic small-vessel white matter ischemic changes.   CXR: CHEST 2 VIEW  COMPARISON: 07/13/2015  FINDINGS: The heart size and mediastinal contours are within normal limits. Mild bibasilar scarring is stable. No evidence of pulmonary infiltrate or edema. No evidence of pneumothorax or pleural effusion. The visualized skeletal structures are unremarkable.  IMPRESSION: Stable mild bibasilar scarring. No active disease.  ECHO:  Study Conclusions  - Left ventricle: The cavity size was normal. Wall thickness was normal. Systolic function was mildly to moderately reduced. The estimated ejection fraction was in the range of 40% to 45%. Diffuse hypokinesis that appears worse inferolaterally. Doppler parameters are consistent with abnormal left ventricular  relaxation (grade 1 diastolic dysfunction). - Aortic valve: There was trivial regurgitation. - Aorta: Mildly dilated aortic root. Aortic root dimension: 38 mm (ED). - Mitral valve: Mildly calcified annulus. There was no significant regurgitation. - Right ventricle: The cavity size was mildly dilated. Systolic function was mildly reduced. - Pulmonary arteries: No complete TR doppler jet so unable to estimate PA systolic pressure. - Inferior vena cava: The vessel was normal in size. The respirophasic diameter changes were in the normal range (= 50%), consistent with normal  central venous pressure.  Impressions:  - Normal LV size with EF 40-45%. Diffuse hypokinesis worse inferolaterally. Mildly dilated RV with mildly decreased systolic function. No significant valvular abnormalities.  Discharge Exam: Blood pressure 114/61, pulse 66, temperature 99.1 F (37.3 C), temperature source Oral, resp. rate 17, height 6' (1.829 m), weight 197 lb 14.4 oz (89.767 kg), SpO2 99 %.  Disposition:  home     Medication List    STOP taking these medications        predniSONE 10 MG tablet  Commonly known as:  DELTASONE      TAKE these medications        acetaminophen 325 MG tablet  Commonly known as:  TYLENOL  Take 2 tablets (650 mg total) by mouth every 4 (four) hours as needed for headache or mild pain.     amLODipine 2.5 MG tablet  Commonly known as:  NORVASC  Take 1 tablet (2.5 mg total) by mouth daily.     aspirin 81 MG tablet  Take 81 mg by mouth daily.     atenolol 25 MG tablet  Commonly known as:  TENORMIN  TAKE ONE-HALF TABLET BY MOUTH ONCE DAILY     feeding supplement (ENSURE ENLIVE) Liqd  Take 237 mLs by mouth 2 (two) times daily between meals.     NITROSTAT 0.4 MG SL tablet  Generic drug:  nitroGLYCERIN  DISSOLVE ONE TABLET UNDER THE TONGUE EVERY 5 MINUTES AS NEEDED     omeprazole 20 MG capsule  Commonly known as:  PRILOSEC  TAKE 1 CAPSULE DAILY     rosuvastatin 10 MG tablet  Commonly known as:  CRESTOR  TAKE 1 TABLET DAILY PRIOR TO BEDTIME     triamterene-hydrochlorothiazide 37.5-25 MG tablet  Commonly known as:  MAXZIDE-25  TAKE ONE TABLET BY MOUTH ONCE DAILY       Follow-up Information    Follow up with Dorris Carnes, MD On 09/08/2015.   Specialty:  Cardiology   Why:  keep appt with Dr. Harrington Challenger,  at 10:30 am   Contact information:   [place] [address] [phone]       Follow up with Eulas Post, MD. Go on 09/07/2015.   Specialty:  Family Medicine   Why:  Follow up @230pm     Contact information:   [place] [address] [phone]        Discharge Instructions:  Hold your triamterene-hctz for 2 days and if you feel better may resume.  Call the office if further problems.  Heart Healthy diet   Signed: CWCBJS,EGBTD R Nurse Practitioner-Certified [facility]: [facility] 08/24/2015, 11:51 AM  Time spent on discharge :  >30 minutes.

## 2015-08-24 NOTE — Progress Notes (Signed)
  Echocardiogram 2D Echocardiogram has been performed.  [name] 08/24/2015, 10:40 AM

## 2015-08-28 ENCOUNTER — Other Ambulatory Visit: Payer: Self-pay | Admitting: *Deleted

## 2015-08-28 MED ORDER — AMLODIPINE BESYLATE 2.5 MG PO TABS: 2.5000 mg | ORAL_TABLET | Freq: Every day | ORAL | Status: DC

## 2015-08-29 ENCOUNTER — Other Ambulatory Visit: Payer: Self-pay | Admitting: Internal Medicine

## 2015-09-07 ENCOUNTER — Other Ambulatory Visit: Payer: Self-pay

## 2015-09-07 ENCOUNTER — Encounter: Payer: Self-pay | Admitting: Family Medicine

## 2015-09-07 ENCOUNTER — Ambulatory Visit (INDEPENDENT_AMBULATORY_CARE_PROVIDER_SITE_OTHER): Payer: Medicare Other | Admitting: Family Medicine

## 2015-09-07 VITALS — BP 130/90 | HR 87 | Temp 97.8°F | Resp 16 | Ht 72.0 in | Wt 198.8 lb

## 2015-09-07 DIAGNOSIS — M353 Polymyalgia rheumatica: Secondary | ICD-10-CM | POA: Diagnosis not present

## 2015-09-07 MED ORDER — ACCU-CHEK NANO SMARTVIEW W/DEVICE KIT: PACK | Status: DC

## 2015-09-07 NOTE — Progress Notes (Signed)
Subjective:    Patient ID: [name]., male    DOB: 07/26/1933, 81 y.o.   MRN: [id number]  HPI Patient seen for hospital follow-up and for follow-up regarding recent myalgias. Recent hospital discharge notes reviewed. Patient was admitted on October 26. He was at [facility] and began to feel weak while he was there. He went to his car and felt dizzy. EMS was called. There was no syncope. No chest pains. In retrospect, he had not had anything to drink that morning. Troponins negative. Systolic blood pressure 98. Symptomatically improved with IV fluids. Chest x-ray and urinalysis unremarkable. Felt to have viral syndrome. Did have mildly elevated white blood count but was on low-dose prednisone. Diuretics were held for 2 days. Sedimentation rate 22. CT head no acute findings. Echo ejection fraction 40-45%. Was felt not to have any cardiac etiology to his weakness- more likely viral syndrome and dehydration.  Recent severe myalgias predominantly shoulder, neck, upper back. Sedimentation rate 72. Started on prednisone for presumed and suspected polymyalgia rheumatica. He had dramatic improvement on prednisone for a few days and after running out had recurrent symptoms. When then resumed prednisone at low dose of 15 mg daily and his myalgias almost completely resolved again. When he was recently in the hospital he did not take his prednisone for 2 days and again had recurrence. Again, after starting that prednisone his symptoms resolved. At this point he is taking prednisone 15 mg daily and feels near his baseline. His appetite is good. Still has some fatigue but overall improved. No significant myalgias. No polyuria or polydipsia  Past Medical History  Diagnosis Date  . Coronary artery disease     stent (promus) Cx/OM'09  . Prostatitis, acute   . Gout   . PVC (premature ventricular contraction)     hx  . Palpitations   . Hyperlipidemia   . Hypertension   . CAD 01/16/2009  . CHEST PAIN  UNSPECIFIED 11/06/2009  . HYPERLIPIDEMIA 10/13/2007  . HYPERTENSION 10/13/2007  . 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 PALPITATIONS, HX OF 10/13/2007  . PROSTATITIS, ACUTE, HX OF 10/13/2007  . PSORIASIS 10/13/2007  . Pneumonia     "when I was a kid"  . GERD (gastroesophageal reflux disease)   . Osteoarthritis     "left shoulder" (08/23/2015)  . OSTEOARTHRITIS, ANKLE, RIGHT 10/13/2007  . Fibromyalgia dx'd ~ 04/2015  . SKIN CANCER, RECURRENT 10/13/2007  . HEARING LOSS     "temporary"   Past Surgical History  Procedure Laterality Date  . Vasectomy    . Appendectomy    . Cyst excision Right X 2    shoulder  . Electrolysis of misdirected lashes Bilateral     eyelashes are misdirected and grow inwards   . Tear duct probing Left   . Eye surgery    . Coronary angioplasty with stent placement    . Mass excision Right 01/2005    proximal thigh soft tissue mass  . Tympanoplasty Bilateral     "for hearing loss; put tubes in also, 2X on right, 1X on the left; tubes worked"    reports that he has never smoked. He has never used smokeless tobacco. He reports that he does not drink alcohol or use illicit drugs. family history includes Breast cancer in his sister; Heart attack (age of onset: 58) in his father; Heart attack (age of onset: 38) in an other family member; Lung cancer in an other family member; Parkinsonism (age of onset: 43)  in his mother. Allergies  Allergen Reactions  . Niacin       Review of Systems  Constitutional: Negative for fatigue.  Eyes: Negative for visual disturbance.  Respiratory: Negative for cough, chest tightness and shortness of breath.   Cardiovascular: Negative for chest pain, palpitations and leg swelling.  Endocrine: Negative for polydipsia and polyuria.  Musculoskeletal: Negative for arthralgias.  Neurological: Negative for dizziness, syncope, weakness, light-headedness and headaches.       Objective:   Physical Exam  Constitutional: He  is oriented to person, place, and time. He appears well-developed and well-nourished.  HENT:  Right Ear: External ear normal.  Left Ear: External ear normal.  Mouth/Throat: Oropharynx is clear and moist.  Eyes: Pupils are equal, round, and reactive to light.  Neck: Neck supple. No thyromegaly present.  Cardiovascular: Normal rate and regular rhythm.   Pulmonary/Chest: Effort normal and breath sounds normal. No respiratory distress. He has no wheezes. He has no rales.  Musculoskeletal: He exhibits no edema.  Neurological: He is alert and oriented to person, place, and time.  Psychiatric: He has a normal mood and affect. His behavior is normal.          Assessment & Plan:  Probable polymyalgia rheumatica. Patient had fairly dramatic response to low-dose prednisone and on 2 occasions after stopping prednisone had prompt recurrence of symptoms. He had recent drop in sedimentation rate from 72 to 22 after starting prednisone. We talked about natural history of PMR. We explained that this usually is a process that extends from 9 to 18 months and sometimes longer. Continue current dose of prednisone 15 mg daily and in 2 weeks he will try tapering to 12.5 mg. In 6 weeks  (if symptomatically stable) will follow-up and repeat sedimentation rate and A1c then. We will try gradual tapering to 10 mg then probably leave at that level for about 9 months and then slow 1 mg increment taper from there. His daughter, who is a Marine scientist, will monitor his blood sugars at least once weekly

## 2015-09-07 NOTE — Patient Instructions (Signed)
Continue with prednisone 15 mg daily for another 2 weeks and then decrease to 12.5 mg once daily until follow up. We will plan to check your sed rate at next visit. Monitor blood sugars at least once per week if possible.

## 2015-09-08 ENCOUNTER — Encounter: Payer: Self-pay | Admitting: Internal Medicine

## 2015-09-08 ENCOUNTER — Ambulatory Visit (INDEPENDENT_AMBULATORY_CARE_PROVIDER_SITE_OTHER): Payer: Medicare Other | Admitting: Internal Medicine

## 2015-09-08 VITALS — BP 118/78 | HR 82 | Ht 72.0 in | Wt 197.1 lb

## 2015-09-08 DIAGNOSIS — E785 Hyperlipidemia, unspecified: Secondary | ICD-10-CM

## 2015-09-08 DIAGNOSIS — I5022 Chronic systolic (congestive) heart failure: Secondary | ICD-10-CM

## 2015-09-08 DIAGNOSIS — I10 Essential (primary) hypertension: Secondary | ICD-10-CM | POA: Diagnosis not present

## 2015-09-08 MED ORDER — LISINOPRIL 5 MG PO TABS: 5.0000 mg | ORAL_TABLET | Freq: Every day | ORAL | Status: DC

## 2015-09-08 MED ORDER — NITROGLYCERIN 0.4 MG SL SUBL: SUBLINGUAL_TABLET | SUBLINGUAL | Status: DC

## 2015-09-08 NOTE — Progress Notes (Signed)
Cardiology Office Note   Date:  09/08/2015   ID:  Jacob Bowens., DOB 1933-07-24, MRN [id number]  PCP:  Eulas Post, MD  Cardiologist:   Dorris Carnes, MD   Chief Complaint  Patient presents with  . Coronary Artery Disease      History of Present Illness: Jacob Goward. is a 81 y.o. male with a history of CAD (s/p PROMUS stent to LCx in 2009)  Also a history of PVCs, HTN, HL  I saw him in cliinc in May  He was admitted to [facility] a couple weeks ago with weakness.  R/O for MI  Did have a low grade fever  Echo done  LVEF was 40 to 45% with diffuse hypokinesis, worse in the inferolateral wall    He received some hydration and was sent home  And diuretics held    Since seen he says he is feeling ok  Didn't sleep well last night  Breathing OK  No CP       Current Outpatient Prescriptions  Medication Sig Dispense Refill  . amLODipine (NORVASC) 2.5 MG tablet TAKE 1 TABLET DAILY 90 tablet 2  . aspirin 81 MG tablet Take 81 mg by mouth daily.      [name] atenolol (TENORMIN) 25 MG tablet TAKE ONE-HALF TABLET BY MOUTH ONCE DAILY 45 tablet 0  . NITROSTAT 0.4 MG SL tablet DISSOLVE ONE TABLET UNDER THE TONGUE EVERY 5 MINUTES AS NEEDED 25 tablet 0  . omeprazole (PRILOSEC) 20 MG capsule TAKE 1 CAPSULE DAILY 90 capsule 2  . predniSONE (DELTASONE) 10 MG tablet     . rosuvastatin (CRESTOR) 10 MG tablet TAKE 1 TABLET DAILY PRIOR TO BEDTIME 90 tablet 0  . triamterene-hydrochlorothiazide (MAXZIDE-25) 37.5-25 MG per tablet TAKE ONE TABLET BY MOUTH ONCE DAILY 90 tablet 3   No current facility-administered medications for this visit.    Allergies:   Niacin   Past Medical History  Diagnosis Date  . Coronary artery disease     stent (promus) Cx/OM'09  . Prostatitis, acute   . Gout   . PVC (premature ventricular contraction)     hx  . Palpitations   . Hyperlipidemia   . Hypertension   . CAD 01/16/2009  . CHEST PAIN UNSPECIFIED 11/06/2009  . HYPERLIPIDEMIA 10/13/2007    . HYPERTENSION 10/13/2007  . 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 PALPITATIONS, HX OF 10/13/2007  . PROSTATITIS, ACUTE, HX OF 10/13/2007  . PSORIASIS 10/13/2007  . Pneumonia     "when I was a kid"  . GERD (gastroesophageal reflux disease)   . Osteoarthritis     "left shoulder" (08/23/2015)  . OSTEOARTHRITIS, ANKLE, RIGHT 10/13/2007  . Fibromyalgia dx'd ~ 04/2015  . SKIN CANCER, RECURRENT 10/13/2007  . HEARING LOSS     "temporary"    Past Surgical History  Procedure Laterality Date  . Vasectomy    . Appendectomy    . Cyst excision Right X 2    shoulder  . Electrolysis of misdirected lashes Bilateral     eyelashes are misdirected and grow inwards   . Tear duct probing Left   . Eye surgery    . Coronary angioplasty with stent placement    . Mass excision Right 01/2005    proximal thigh soft tissue mass  . Tympanoplasty Bilateral     "for hearing loss; put tubes in also, 2X on right, 1X on the left; tubes worked"     Social History:  The  patient  reports that he has never smoked. He has never used smokeless tobacco. He reports that he does not drink alcohol or use illicit drugs.   Family History:  The patient's family history includes Breast cancer in his sister; Heart attack (age of onset: 43) in his father; Heart attack (age of onset: 65) in an other family member; Lung cancer in an other family member; Parkinsonism (age of onset: 71) in his mother.    ROS:  Please see the history of present illness. All other systems are reviewed and  Negative to the above problem except as noted.    PHYSICAL EXAM: VS:  BP 118/78 mmHg  Pulse 82  Ht 6' (1.829 m)  Wt 89.413 kg (197 lb 1.9 oz)  BMI 26.73 kg/m2  GEN: Well nourished, well developed, in no acute distress HEENT: normal Neck: no JVD, carotid bruits, or masses Cardiac: RRR; no murmurs, rubs, or gallops,no edema  Respiratory:  clear to auscultation bilaterally, normal work of breathing GI: soft, nontender,  nondistended, + BS  No hepatomegaly  MS: no deformity Moving all extremities   Skin: warm and dry, no rash Neuro:  Strength and sensation are intact Psych: euthymic mood, full affect   EKG:  EKG is not ordered today.   Lipid Panel    Component Value Date/Time   CHOL 92 08/24/2015 0653   TRIG 87 08/24/2015 0653   HDL 30* 08/24/2015 0653   CHOLHDL 3.1 08/24/2015 0653   VLDL 17 08/24/2015 0653   LDLCALC 45 08/24/2015 0653   LDLDIRECT 128.0 11/16/2007 1454      Wt Readings from Last 3 Encounters:  09/08/15 89.413 kg (197 lb 1.9 oz)  09/07/15 90.175 kg (198 lb 12.8 oz)  08/23/15 89.767 kg (197 lb 14.4 oz)      ASSESSMENT AND PLAN:  Pt s/p D/C recently for weakness  Cause not found  ? Viral  2.  CAD  I am not convinced of active angina  Keep on medical Rx  3  Systolic CHF  Would recomm headd lisinoprl to regimen  He should start in a couple wks  Stop amlodipine at that time Get BMET check when he sees Dr Elease Hashimoto.  4  HTN  Will need to follow    5  HL  Keep on crestor.          Signed, Dorris Carnes, MD  09/08/2015 10:50 AM    [address] [place], [place], [place]  [address] Phone: [phone]; Fax: [phone]

## 2015-09-08 NOTE — Patient Instructions (Signed)
Medication Instructions:   STOP AMLODIPINE ONE WEEK PRIOR TO SEEING DR BURCHETTE  AFTER STOPPING AMLODIPINE, START LISINOPRIL 5 MG ONCE DAILY  Labwork:  Your physician recommends that you return for lab work WHEN SEEN BY DR BURCHETTE= BMET  Follow-Up:  Your physician recommends that you schedule a follow-up appointment in: MARCH 2017 [place]  If you need a refill on your cardiac medications before your next appointment, please call your pharmacy.

## 2015-09-20 ENCOUNTER — Other Ambulatory Visit: Payer: Self-pay | Admitting: Internal Medicine

## 2015-09-20 MED ORDER — NITROGLYCERIN 0.4 MG SL SUBL: SUBLINGUAL_TABLET | SUBLINGUAL | Status: DC

## 2015-09-26 ENCOUNTER — Other Ambulatory Visit: Payer: Self-pay | Admitting: *Deleted

## 2015-10-02 ENCOUNTER — Other Ambulatory Visit: Payer: Self-pay | Admitting: Family Medicine

## 2015-10-02 ENCOUNTER — Telehealth: Payer: Self-pay | Admitting: Family Medicine

## 2015-10-02 ENCOUNTER — Other Ambulatory Visit: Payer: Self-pay | Admitting: Internal Medicine

## 2015-10-02 DIAGNOSIS — R7301 Impaired fasting glucose: Secondary | ICD-10-CM

## 2015-10-02 NOTE — Telephone Encounter (Signed)
Pt needs new rxs lancet and testing strips. Pt has accu chek nano glucometer. Pt received glucometer in office. Pt would  To know how often he suppose to check his bs currently pt is just checking bs on Monday only. [facility] [address]

## 2015-10-02 NOTE — Telephone Encounter (Signed)
Refill OK.  He is taking this for PMR (polymyalgia rheumatica) and I would change instructions to Prednisone take 12.5 mg once daily He should have follow up sometime in December.

## 2015-10-03 ENCOUNTER — Other Ambulatory Visit: Payer: Self-pay | Admitting: Family Medicine

## 2015-10-03 DIAGNOSIS — R7301 Impaired fasting glucose: Secondary | ICD-10-CM | POA: Insufficient documentation

## 2015-10-03 MED ORDER — PREDNISONE 5 MG PO TABS: ORAL_TABLET | ORAL | Status: DC

## 2015-10-03 MED ORDER — GLUCOSE BLOOD VI STRP: ORAL_STRIP | Status: DC

## 2015-10-03 MED ORDER — PREDNISONE 10 MG PO TABS: 10.0000 mg | ORAL_TABLET | Freq: Every day | ORAL | Status: DC

## 2015-10-03 MED ORDER — ACCU-CHEK FASTCLIX LANCETS MISC: Status: DC

## 2015-10-03 NOTE — Telephone Encounter (Signed)
Diabetic supply sent to pharmacy. Pt is aware.

## 2015-10-05 ENCOUNTER — Other Ambulatory Visit: Payer: Self-pay | Admitting: *Deleted

## 2015-10-05 MED ORDER — GLUCOSE BLOOD VI STRP: ORAL_STRIP | Status: DC

## 2015-10-19 ENCOUNTER — Ambulatory Visit (INDEPENDENT_AMBULATORY_CARE_PROVIDER_SITE_OTHER): Payer: Medicare Other | Admitting: Family Medicine

## 2015-10-19 VITALS — BP 110/62 | HR 96 | Temp 98.3°F | Resp 16 | Ht 72.0 in | Wt 202.8 lb

## 2015-10-19 DIAGNOSIS — R7301 Impaired fasting glucose: Secondary | ICD-10-CM | POA: Diagnosis not present

## 2015-10-19 DIAGNOSIS — B349 Viral infection, unspecified: Secondary | ICD-10-CM | POA: Diagnosis not present

## 2015-10-19 DIAGNOSIS — M353 Polymyalgia rheumatica: Secondary | ICD-10-CM | POA: Diagnosis not present

## 2015-10-19 DIAGNOSIS — B370 Candidal stomatitis: Secondary | ICD-10-CM

## 2015-10-19 MED ORDER — NYSTATIN 100000 UNIT/ML MT SUSP: 5.0000 mL | Freq: Four times a day (QID) | OROMUCOSAL | Status: DC

## 2015-10-19 NOTE — Patient Instructions (Signed)
Thrush, Adult [name], also called oral candidiasis, is a fungal infection that develops in the mouth and throat and on the tongue. It causes white patches to form on the mouth and tongue. [name] is most common in older adults, but it can occur at any age.  Many cases of thrush are mild, but this infection can also be more serious. [name] can be a recurring problem for people who have chronic illnesses or who take medicines that limit the body's ability to fight infection. Because these people have difficulty fighting infections, the fungus that causes thrush can spread throughout the body. This can cause life-threatening blood or organ infections. CAUSES  [name] is usually caused by a yeast called Candida albicans. This fungus is normally present in small amounts in the mouth and on other mucous membranes. It usually causes no harm. However, when conditions are present that allow the fungus to grow uncontrolled, it invades surrounding tissues and becomes an infection. Less often, other Candida species can also lead to thrush.  RISK FACTORS [name] is more likely to develop in the following people:  People with an impaired ability to fight infection (weakened immune system).   Older adults.   People with HIV.   People with diabetes.   People with dry mouth (xerostomia).   Pregnant women.   People with poor dental care, especially those who have false teeth.   People who use antibiotic medicines.  SIGNS AND SYMPTOMS  [name] can be a mild infection that causes no symptoms. If symptoms develop, they may include:   A burning feeling in the mouth and throat. This can occur at the start of a thrush infection.   White patches that adhere to the mouth and tongue. The tissue around the patches may be red, raw, and painful. If rubbed (during tooth brushing, for example), the patches and the tissue of the mouth may bleed easily.   A bad taste in the mouth or difficulty tasting foods.    Cottony feeling in the mouth.   Pain during eating and swallowing. DIAGNOSIS  Your health care provider can usually diagnose thrush by looking in your mouth and asking you questions about your health.  TREATMENT  Medicines that help prevent the growth of fungi (antifungals) are the standard treatment for thrush. These medicines are either applied directly to the affected area (topical) or swallowed (oral). The treatment will depend on the severity of the condition.  Mild Thrush Mild cases of thrush may clear up with the use of an antifungal mouth rinse or lozenges. Treatment usually lasts about 14 days.  Moderate to Severe Thrush  More severe thrush infections that have spread to the esophagus are treated with an oral antifungal medicine. A topical antifungal medicine may also be used.   For some severe infections, a treatment period longer than 14 days may be needed.   Oral antifungal medicines are almost never used during pregnancy because the fetus may be harmed. However, if a pregnant woman has a rare, severe thrush infection that has spread to her blood, oral antifungal medicines may be used. In this case, the risk of harm to the mother and fetus from the severe thrush infection may be greater than the risk posed by the use of antifungal medicines.  Persistent or Recurrent Thrush For cases of thrush that do not go away or keep coming back, treatment may involve the following:   Treatment may be needed twice as long as the symptoms last.   Treatment will include  both oral and topical antifungal medicines.   People with weakened immune systems can take an antifungal medicine on a continuous basis to prevent thrush infections.  It is important to treat conditions that make you more likely to get thrush, such as diabetes or HIV.  HOME CARE INSTRUCTIONS   Only take over-the-counter or prescription medicine as directed by your health care provider. Talk to your health care  provider about an over-the-counter medicine called gentian violet, which kills bacteria and fungi.   Eat plain, unflavored yogurt as directed by your health care provider. Check the label to make sure the yogurt contains live cultures. This yogurt can help healthy bacteria grow in the mouth that can stop the growth of the fungus that causes thrush.   Try these measures to help reduce the discomfort of thrush:   Drink cold liquids such as water or iced tea.   Try flavored ice treats or frozen juices.   Eat foods that are easy to swallow, such as gelatin, ice cream, or custard.   If the patches in your mouth are painful, try drinking from a straw.   Rinse your mouth several times a day with a warm saltwater rinse. You can make the saltwater mixture with 1 tsp (6 g) of salt in 8 fl oz (0.2 L) of warm water.   If you wear dentures, remove the dentures before going to bed, brush them vigorously, and soak them in a cleaning solution as directed by your health care provider.   Women who are breastfeeding should clean their nipples with an antifungal medicine as directed by their health care provider. Dry the nipples after breastfeeding. Applying lanolin-containing body lotion may help relieve nipple soreness.  SEEK MEDICAL CARE IF:  Your symptoms are getting worse or are not improving within 7 days of starting treatment.   You have symptoms of spreading infection, such as white patches on the skin outside of the mouth.   You are nursing and you have redness, burning, or pain in the nipples that is not relieved with treatment.  MAKE SURE YOU:  Understand these instructions.  Will watch your condition.  Will get help right away if you are not doing well or get worse.   This information is not intended to replace advice given to you by your health care provider. Make sure you discuss any questions you have with your health care provider.   Document Released: 07/09/2004 Document  Revised: 11/04/2014 Document Reviewed: 05/17/2013 Elsevier Interactive Patient Education [employer].  Return in about 3 weeks for labs. We will plan to taper your prednisone further at that time if labs stable.

## 2015-10-19 NOTE — Progress Notes (Signed)
Subjective:    Patient ID: Jacob Bowens., male    DOB: [birth year], 81 y.o.   MRN: [id number]  HPI Patient here for follow-up polymyalgia rheumatica Currently on prednisone 12.5 mg daily. Myalgias are relatively stable. He has been somewhat achy past few days probably from viral syndrome otherwise fairly well controlled Our initial plan had been to repeat sedimentation rate today and if stable further taper his prednisone to 10 mg.  One-day history of sore throat, body aches, postnasal drip and congestion. Mild cough. Sore throat is been severe at night. Denies any associated fever or chills. No nausea or vomiting.  History of impaired fasting glucose. Brings in his meter today and several blood sugars are reviewed over the past few weeks and these have been consistently below 100  Past Medical History  Diagnosis Date  . Coronary artery disease     stent (promus) Cx/OM'09  . Prostatitis, acute   . Gout   . PVC (premature ventricular contraction)     hx  . Palpitations   . Hyperlipidemia   . Hypertension   . CAD 01/16/2009  . CHEST PAIN UNSPECIFIED 11/06/2009  . HYPERLIPIDEMIA 10/13/2007  . HYPERTENSION 10/13/2007  . 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 PALPITATIONS, HX OF 10/13/2007  . PROSTATITIS, ACUTE, HX OF 10/13/2007  . PSORIASIS 10/13/2007  . Pneumonia     "when I was a kid"  . GERD (gastroesophageal reflux disease)   . Osteoarthritis     "left shoulder" (08/23/2015)  . OSTEOARTHRITIS, ANKLE, RIGHT 10/13/2007  . Fibromyalgia dx'd ~ 04/2015  . SKIN CANCER, RECURRENT 10/13/2007  . HEARING LOSS     "temporary"   Past Surgical History  Procedure Laterality Date  . Vasectomy    . Appendectomy    . Cyst excision Right X 2    shoulder  . Electrolysis of misdirected lashes Bilateral     eyelashes are misdirected and grow inwards   . Tear duct probing Left   . Eye surgery    . Coronary angioplasty with stent placement    . Mass excision Right  01/2005    proximal thigh soft tissue mass  . Tympanoplasty Bilateral     "for hearing loss; put tubes in also, 2X on right, 1X on the left; tubes worked"    reports that he has never smoked. He has never used smokeless tobacco. He reports that he does not drink alcohol or use illicit drugs. family history includes Breast cancer in his sister; Heart attack (age of onset: 27) in his father; Parkinsonism (age of onset: 28) in his mother. Allergies  Allergen Reactions  . Niacin       Review of Systems  Constitutional: Negative for fatigue.  HENT: Positive for congestion and sore throat.   Eyes: Negative for visual disturbance.  Respiratory: Positive for cough. Negative for chest tightness and shortness of breath.   Cardiovascular: Negative for chest pain, palpitations and leg swelling.  Endocrine: Negative for polydipsia and polyuria.  Neurological: Negative for dizziness, syncope, weakness, light-headedness and headaches.       Objective:   Physical Exam  Constitutional: He appears well-developed and well-nourished.  HENT:  Right Ear: External ear normal.  Left Ear: External ear normal.  Prior tonsillectomy Patient has mild erythema posterior pharynx. He has what appears to be some mild thrush on the uvula (right, patches) otherwise none noted  Neck: Neck supple.  Cardiovascular: Normal rate and regular rhythm.   Pulmonary/Chest: Effort normal and  breath sounds normal. No respiratory distress. He has no wheezes. He has no rales.  Musculoskeletal: He exhibits no edema.  Lymphadenopathy:    He has no cervical adenopathy.          Assessment & Plan:  #1 polymyalgia rheumatica. Symptomatically stable. Currently prednisone 12.5 mgs daily. We elected not to get sedimentation rate today since he has acute illness. Come back mid January. Repeat sedimentation rate and if stable at that time taper prednisone to 10 mg  #2 sore throat. Possible viral syndrome but also clinically  appears to have some mild thrush on his uvula region. Nystatin oral suspension 3-4 times daily. Follow-up for any odynophagia-or other progressive symptoms  #3 history of impaired glucose tolerance/prediabetes. Check A1c at follow-up in 3 weeks for labs

## 2015-10-24 ENCOUNTER — Encounter: Payer: Self-pay | Admitting: Family Medicine

## 2015-10-24 ENCOUNTER — Ambulatory Visit (INDEPENDENT_AMBULATORY_CARE_PROVIDER_SITE_OTHER): Payer: Medicare Other | Admitting: Family Medicine

## 2015-10-24 VITALS — BP 100/64 | HR 107 | Temp 98.3°F | Ht 72.0 in | Wt 201.6 lb

## 2015-10-24 DIAGNOSIS — R05 Cough: Secondary | ICD-10-CM | POA: Diagnosis not present

## 2015-10-24 DIAGNOSIS — R059 Cough, unspecified: Secondary | ICD-10-CM

## 2015-10-24 MED ORDER — CEFUROXIME AXETIL 250 MG PO TABS: 250.0000 mg | ORAL_TABLET | Freq: Two times a day (BID) | ORAL | Status: DC

## 2015-10-24 MED ORDER — HYDROCODONE-HOMATROPINE 5-1.5 MG/5ML PO SYRP: 5.0000 mL | ORAL_SOLUTION | Freq: Four times a day (QID) | ORAL | Status: AC | PRN

## 2015-10-24 NOTE — Progress Notes (Signed)
   Subjective:    Patient ID: Jacob Bowens., male    DOB: Jun 03, 1933, 81 y.o.   MRN: [id number]  HPI Patient seen for acute visit. He was seen recently and mentioned some upper respiratory symptoms which we felt were viral. On exam, he had what appeared to be some possible early mild thrush on his uvula. He started nystatin suspension and sore throat is fully resolved. However, he has dry cough which has been progressive and severe at night. Denies any fever. No dyspnea. Cough not relieved with over-the-counter medications.  Past Medical History  Diagnosis Date  . Coronary artery disease     stent (promus) Cx/OM'09  . Prostatitis, acute   . Gout   . PVC (premature ventricular contraction)     hx  . Palpitations   . Hyperlipidemia   . Hypertension   . CAD 01/16/2009  . CHEST PAIN UNSPECIFIED 11/06/2009  . HYPERLIPIDEMIA 10/13/2007  . HYPERTENSION 10/13/2007  . 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 PALPITATIONS, HX OF 10/13/2007  . PROSTATITIS, ACUTE, HX OF 10/13/2007  . PSORIASIS 10/13/2007  . Pneumonia     "when I was a kid"  . GERD (gastroesophageal reflux disease)   . Osteoarthritis     "left shoulder" (08/23/2015)  . OSTEOARTHRITIS, ANKLE, RIGHT 10/13/2007  . Fibromyalgia dx'd ~ 04/2015  . SKIN CANCER, RECURRENT 10/13/2007  . HEARING LOSS     "temporary"   Past Surgical History  Procedure Laterality Date  . Vasectomy    . Appendectomy    . Cyst excision Right X 2    shoulder  . Electrolysis of misdirected lashes Bilateral     eyelashes are misdirected and grow inwards   . Tear duct probing Left   . Eye surgery    . Coronary angioplasty with stent placement    . Mass excision Right 01/2005    proximal thigh soft tissue mass  . Tympanoplasty Bilateral     "for hearing loss; put tubes in also, 2X on right, 1X on the left; tubes worked"    reports that he has never smoked. He has never used smokeless tobacco. He reports that he does not drink  alcohol or use illicit drugs. family history includes Breast cancer in his sister; Heart attack (age of onset: 48) in his father; Parkinsonism (age of onset: 68) in his mother. Allergies  Allergen Reactions  . Niacin        Review of Systems  Constitutional: Negative for fever and chills.  HENT: Negative for congestion.   Respiratory: Positive for cough. Negative for shortness of breath.   Cardiovascular: Negative for chest pain.       Objective:   Physical Exam  Constitutional: He appears well-developed and well-nourished.  Cardiovascular: Normal rate and regular rhythm.   Pulmonary/Chest: Effort normal. He has no wheezes.  He has some faint crackles in his left base but no wheezes. No respiratory distress          Assessment & Plan:  Cough. Given breath sounds above start Ceftin 250 mg twice a day for 10 days. Hycodan cough syrup 1 teaspoon every 6 hours for severe cough. Follow-up promptly for fever or worsening symptoms CXR for any worsening symptoms.

## 2015-10-24 NOTE — Patient Instructions (Signed)
Follow up for fever or increased shortness of breath.

## 2015-10-24 NOTE — Progress Notes (Signed)
Pre visit review using our clinic review tool, if applicable. No additional management support is needed unless otherwise documented below in the visit note. 

## 2015-10-26 ENCOUNTER — Other Ambulatory Visit: Payer: Self-pay | Admitting: Family Medicine

## 2015-10-26 MED ORDER — LIFESCAN UNISTIK II LANCETS MISC: Status: DC

## 2015-11-02 ENCOUNTER — Other Ambulatory Visit: Payer: Self-pay | Admitting: Family Medicine

## 2015-11-05 ENCOUNTER — Other Ambulatory Visit: Payer: Self-pay | Admitting: Internal Medicine

## 2015-11-08 DIAGNOSIS — Z85828 Personal history of other malignant neoplasm of skin: Secondary | ICD-10-CM | POA: Diagnosis not present

## 2015-11-08 DIAGNOSIS — L570 Actinic keratosis: Secondary | ICD-10-CM | POA: Diagnosis not present

## 2015-11-15 ENCOUNTER — Other Ambulatory Visit (INDEPENDENT_AMBULATORY_CARE_PROVIDER_SITE_OTHER): Payer: Medicare Other

## 2015-11-15 DIAGNOSIS — M353 Polymyalgia rheumatica: Secondary | ICD-10-CM | POA: Diagnosis not present

## 2015-11-15 DIAGNOSIS — R7301 Impaired fasting glucose: Secondary | ICD-10-CM | POA: Diagnosis not present

## 2015-11-15 LAB — SEDIMENTATION RATE: Sed Rate: 36 mm/hr — ABNORMAL HIGH (ref 0–22)

## 2015-11-15 LAB — HEMOGLOBIN A1C: Hgb A1c MFr Bld: 6.1 % (ref 4.6–6.5)

## 2015-12-13 DIAGNOSIS — H02059 Trichiasis without entropian unspecified eye, unspecified eyelid: Secondary | ICD-10-CM | POA: Diagnosis not present

## 2015-12-21 ENCOUNTER — Telehealth: Payer: Self-pay | Admitting: Family Medicine

## 2015-12-21 ENCOUNTER — Encounter: Payer: Self-pay | Admitting: Family Medicine

## 2015-12-21 ENCOUNTER — Ambulatory Visit (INDEPENDENT_AMBULATORY_CARE_PROVIDER_SITE_OTHER): Payer: Medicare Other | Admitting: Family Medicine

## 2015-12-21 VITALS — BP 132/90 | HR 72 | Temp 98.3°F | Ht 72.0 in | Wt 208.9 lb

## 2015-12-21 DIAGNOSIS — M353 Polymyalgia rheumatica: Secondary | ICD-10-CM | POA: Diagnosis not present

## 2015-12-21 LAB — SEDIMENTATION RATE: Sed Rate: 14 mm/hr (ref 0–22)

## 2015-12-21 NOTE — Telephone Encounter (Signed)
Pt would like to know if you will send him a copy of his labs in the mail.

## 2015-12-21 NOTE — Telephone Encounter (Signed)
Mailed to pt

## 2015-12-21 NOTE — Progress Notes (Signed)
Pre visit review using our clinic review tool, if applicable. No additional management support is needed unless otherwise documented below in the visit note. 

## 2015-12-21 NOTE — Progress Notes (Signed)
   Subjective:    Patient ID: [name]., male    DOB: 09-18-1933, 82 y.o.   MRN: [id number]  HPI Here for follow-up regarding polymyalgia rheumatica. Diagnosed about 4 months ago. Currently on prednisone 12.5 mg daily. Symptoms are very well controlled. Recent A1c 6.1%. Fasting blood sugars consistently less than 130. No polyuria or polydipsia. Has had some mild weight gain.  Past Medical History  Diagnosis Date  . Coronary artery disease     stent (promus) Cx/OM'09  . Prostatitis, acute   . Gout   . PVC (premature ventricular contraction)     hx  . Palpitations   . Hyperlipidemia   . Hypertension   . CAD 01/16/2009  . CHEST PAIN UNSPECIFIED 11/06/2009  . HYPERLIPIDEMIA 10/13/2007  . HYPERTENSION 10/13/2007  . 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 PALPITATIONS, HX OF 10/13/2007  . PROSTATITIS, ACUTE, HX OF 10/13/2007  . PSORIASIS 10/13/2007  . Pneumonia     "when I was a kid"  . GERD (gastroesophageal reflux disease)   . Osteoarthritis     "left shoulder" (08/23/2015)  . OSTEOARTHRITIS, ANKLE, RIGHT 10/13/2007  . Fibromyalgia dx'd ~ 04/2015  . SKIN CANCER, RECURRENT 10/13/2007  . HEARING LOSS     "temporary"   Past Surgical History  Procedure Laterality Date  . Vasectomy    . Appendectomy    . Cyst excision Right X 2    shoulder  . Electrolysis of misdirected lashes Bilateral     eyelashes are misdirected and grow inwards   . Tear duct probing Left   . Eye surgery    . Coronary angioplasty with stent placement    . Mass excision Right 01/2005    proximal thigh soft tissue mass  . Tympanoplasty Bilateral     "for hearing loss; put tubes in also, 2X on right, 1X on the left; tubes worked"    reports that he has never smoked. He has never used smokeless tobacco. He reports that he does not drink alcohol or use illicit drugs. family history includes Breast cancer in his sister; Heart attack (age of onset: 66) in his father; Parkinsonism (age of  onset: 45) in his mother. Allergies  Allergen Reactions  . Niacin       Review of Systems  Respiratory: Negative for cough and shortness of breath.   Cardiovascular: Negative for chest pain.  Musculoskeletal: Negative for myalgias, back pain and neck pain.       Objective:   Physical Exam  Constitutional: He appears well-developed and well-nourished. No distress.  Neck: Neck supple.  Cardiovascular: Normal rate and regular rhythm.   Pulmonary/Chest: Effort normal. No respiratory distress.  Musculoskeletal: He exhibits no edema or tenderness.          Assessment & Plan:  Polymyalgia rheumatica. Repeat sedimentation rate. If stable will go ahead and taper prednisone down to 10 mg. We'll plan to keep on that dose and follow-up 3 months and if sedimentation rate stable that point start slow 1 mg incremental decrease in prednisone until off

## 2016-01-22 ENCOUNTER — Ambulatory Visit (INDEPENDENT_AMBULATORY_CARE_PROVIDER_SITE_OTHER): Payer: Medicare Other | Admitting: Internal Medicine

## 2016-01-22 ENCOUNTER — Encounter: Payer: Self-pay | Admitting: Internal Medicine

## 2016-01-22 VITALS — BP 110/58 | HR 74 | Ht 72.0 in | Wt 208.1 lb

## 2016-01-22 DIAGNOSIS — E785 Hyperlipidemia, unspecified: Secondary | ICD-10-CM | POA: Diagnosis not present

## 2016-01-22 DIAGNOSIS — Z0000 Encounter for general adult medical examination without abnormal findings: Secondary | ICD-10-CM | POA: Diagnosis not present

## 2016-01-22 DIAGNOSIS — I10 Essential (primary) hypertension: Secondary | ICD-10-CM | POA: Diagnosis not present

## 2016-01-22 DIAGNOSIS — Z125 Encounter for screening for malignant neoplasm of prostate: Secondary | ICD-10-CM | POA: Diagnosis not present

## 2016-01-22 LAB — HEPATIC FUNCTION PANEL
ALBUMIN: 3.7 g/dL (ref 3.6–5.1)
ALT: 16 U/L (ref 9–46)
AST: 16 U/L (ref 10–35)
Alkaline Phosphatase: 53 U/L (ref 40–115)
BILIRUBIN DIRECT: 0.1 mg/dL (ref <=0.2)
BILIRUBIN TOTAL: 0.6 mg/dL (ref 0.2–1.2)
Indirect Bilirubin: 0.5 mg/dL (ref 0.2–1.2)
Total Protein: 6.1 g/dL (ref 6.1–8.1)

## 2016-01-22 LAB — TSH: TSH: 3.59 m[IU]/L (ref 0.40–4.50)

## 2016-01-22 LAB — CBC
HCT: 45.8 % (ref 39.0–52.0)
HEMOGLOBIN: 14.9 g/dL (ref 13.0–17.0)
MCH: 31.4 pg (ref 26.0–34.0)
MCHC: 32.5 g/dL (ref 30.0–36.0)
MCV: 96.4 fL (ref 78.0–100.0)
MPV: 10 fL (ref 8.6–12.4)
PLATELETS: 218 10*3/uL (ref 150–400)
RBC: 4.75 MIL/uL (ref 4.22–5.81)
RDW: 15.5 % (ref 11.5–15.5)
WBC: 10.3 10*3/uL (ref 4.0–10.5)

## 2016-01-22 LAB — BASIC METABOLIC PANEL
BUN: 19 mg/dL (ref 7–25)
CALCIUM: 9.4 mg/dL (ref 8.6–10.3)
CHLORIDE: 102 mmol/L (ref 98–110)
CO2: 32 mmol/L — AB (ref 20–31)
Creat: 1.13 mg/dL — ABNORMAL HIGH (ref 0.70–1.11)
GLUCOSE: 99 mg/dL (ref 65–99)
POTASSIUM: 4 mmol/L (ref 3.5–5.3)
SODIUM: 140 mmol/L (ref 135–146)

## 2016-01-22 LAB — LIPID PANEL
CHOL/HDL RATIO: 3.1 ratio (ref <=5.0)
Cholesterol: 141 mg/dL (ref 125–200)
HDL: 46 mg/dL (ref >=40)
LDL Cholesterol: 65 mg/dL (ref <130)
Triglycerides: 149 mg/dL (ref <150)
VLDL: 30 mg/dL (ref <30)

## 2016-01-22 NOTE — Progress Notes (Signed)
Cardiology Office Note   Date:  01/22/2016   ID:  Jacob Bowens., DOB 1933/02/21, MRN [id number]  PCP:  Eulas Post, MD  Cardiologist:   Dorris Carnes, MD   F/U of CAD      History of Present Illness: [name]. is a 82 y.o. male with a history of CAD (s/p PROMUS stent to LCx in 2009) Also a history of PVCs, HTN, HL He was admitted Srping 2016 weakness. R/O for MI Did have a low grade fever Echo done LVEF was 40 to 45% with diffuse hypokinesis, worse in the inferolateral wall   I saw him in November   Since seen he denies dizziness.  No CP  Breathing is OK       Outpatient Prescriptions Prior to Visit  Medication Sig Dispense Refill  . aspirin 81 MG tablet Take 81 mg by mouth daily.      [name] atenolol (TENORMIN) 25 MG tablet TAKE ONE-HALF TABLET BY MOUTH ONCE DAILY 45 tablet 3  . lisinopril (PRINIVIL,ZESTRIL) 5 MG tablet Take 1 tablet (5 mg total) by mouth daily. 90 tablet 3  . nitroGLYCERIN (NITROSTAT) 0.4 MG SL tablet DISSOLVE ONE TABLET UNDER THE TONGUE EVERY 5 MINUTES AS NEEDED 25 tablet 12  . omeprazole (PRILOSEC) 20 MG capsule TAKE 1 CAPSULE DAILY 90 capsule 2  . predniSONE (DELTASONE) 10 MG tablet TAKE ONE TABLET BY MOUTH DAILY WITH BREAKFAST 30 tablet 4  . rosuvastatin (CRESTOR) 10 MG tablet Take 1 tablet (10 mg total) by mouth daily. Prior to bedtime. 90 tablet 2  . ACCU-CHEK FASTCLIX LANCETS MISC Test Blood Sugar once per day. DX: R73.01. (Patient not taking: Reported on 01/22/2016) 30 each 5  . glucose blood (ACCU-CHEK ACTIVE STRIPS) test strip Test once daily Dx E11.9.  accu-chek Nano Smart view (Patient not taking: Reported on 01/22/2016) 100 each 5  . LIFESCAN UNISTIK II LANCETS MISC Test blood sugars daily. DX: R73.01 (Patient not taking: Reported on 01/22/2016) 90 each 2  . predniSONE (DELTASONE) 5 MG tablet TAKE 1/2 TABLET BY MOUTH DAILY, ALONG WITH 10MG  TABLET TO EQUAL 12.5MG  DAILY. (Patient not taking: Reported on 01/22/2016) 15 tablet 4    . triamterene-hydrochlorothiazide (MAXZIDE-25) 37.5-25 MG per tablet TAKE ONE TABLET BY MOUTH ONCE DAILY (Patient not taking: Reported on 01/22/2016) 90 tablet 3   No facility-administered medications prior to visit.     Allergies:   Niacin   Past Medical History  Diagnosis Date  . Coronary artery disease     stent (promus) Cx/OM'09  . Prostatitis, acute   . Gout   . PVC (premature ventricular contraction)     hx  . Palpitations   . Hyperlipidemia   . Hypertension   . CAD 01/16/2009  . CHEST PAIN UNSPECIFIED 11/06/2009  . HYPERLIPIDEMIA 10/13/2007  . HYPERTENSION 10/13/2007  . 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 PALPITATIONS, HX OF 10/13/2007  . PROSTATITIS, ACUTE, HX OF 10/13/2007  . PSORIASIS 10/13/2007  . Pneumonia     "when I was a kid"  . GERD (gastroesophageal reflux disease)   . Osteoarthritis     "left shoulder" (08/23/2015)  . OSTEOARTHRITIS, ANKLE, RIGHT 10/13/2007  . Fibromyalgia dx'd ~ 04/2015  . SKIN CANCER, RECURRENT 10/13/2007  . HEARING LOSS     "temporary"    Past Surgical History  Procedure Laterality Date  . Vasectomy    . Appendectomy    . Cyst excision Right X 2    shoulder  .  Electrolysis of misdirected lashes Bilateral     eyelashes are misdirected and grow inwards   . Tear duct probing Left   . Eye surgery    . Coronary angioplasty with stent placement    . Mass excision Right 01/2005    proximal thigh soft tissue mass  . Tympanoplasty Bilateral     "for hearing loss; put tubes in also, 2X on right, 1X on the left; tubes worked"     Social History:  The patient  reports that he has never smoked. He has never used smokeless tobacco. He reports that he does not drink alcohol or use illicit drugs.   Family History:  The patient's family history includes Breast cancer in his sister; Heart attack (age of onset: 22) in his father; Parkinsonism (age of onset: 71) in his mother.    ROS:  Please see the history of present illness.  All other systems are reviewed and  Negative to the above problem except as noted.    PHYSICAL EXAM: VS:  BP 110/58 mmHg  Pulse 74  Ht 6' (1.829 m)  Wt 208 lb 1.9 oz (94.403 kg)  BMI 28.22 kg/m2  SpO2 98%  GEN: Well nourished, well developed, in no acute distress HEENT: normal Neck: no JVD, carotid bruits, or masses Cardiac: RRR; no murmurs, rubs, or gallops,no edema  Respiratory:  clear to auscultation bilaterally, normal work of breathing GI: soft, nontender, nondistended, + BS  No hepatomegaly  MS: no deformity Moving all extremities   Skin: warm and dry, no rash Neuro:  Strength and sensation are intact Psych: euthymic mood, full affect   EKG:  EKG is not ordered today.   Lipid Panel    Component Value Date/Time   CHOL 92 08/24/2015 0653   TRIG 87 08/24/2015 0653   HDL 30* 08/24/2015 0653   CHOLHDL 3.1 08/24/2015 0653   VLDL 17 08/24/2015 0653   LDLCALC 45 08/24/2015 0653   LDLDIRECT 128.0 11/16/2007 1454      Wt Readings from Last 3 Encounters:  01/22/16 208 lb 1.9 oz (94.403 kg)  12/21/15 208 lb 14.4 oz (94.756 kg)  10/24/15 201 lb 9.6 oz (91.445 kg)      ASSESSMENT AND PLAN:  1  CAD No symptoms of angina  2.  Systolic CHF  Volume status good  Keep on current regine   3  HTN  BP is good   4  HL  Keep on crestor  5  PMR   Still on 10 mg  Needs to call MD re taper  Disposition:   FU with me in November    Signed, Dorris Carnes, MD  01/22/2016 9:33 AM    [address], [place], [place]  [address] Phone: [phone]; Fax: [phone]

## 2016-01-22 NOTE — Patient Instructions (Signed)
Your physician recommends that you continue on your current medications as directed. Please refer to the Current Medication list given to you today. Your physician recommends that you return for lab work in: today (bmet, cbc, lipids, liver function, tsh, psa)  Your physician recommends that you schedule a follow-up appointment in: October, 2017 with Dr. Harrington Challenger.

## 2016-01-23 DIAGNOSIS — Z85828 Personal history of other malignant neoplasm of skin: Secondary | ICD-10-CM | POA: Diagnosis not present

## 2016-01-23 DIAGNOSIS — D692 Other nonthrombocytopenic purpura: Secondary | ICD-10-CM | POA: Diagnosis not present

## 2016-01-23 LAB — PSA: PSA: 1.94 ng/mL (ref <=4.00)

## 2016-01-24 ENCOUNTER — Encounter: Payer: Self-pay | Admitting: *Deleted

## 2016-03-14 DIAGNOSIS — H02059 Trichiasis without entropian unspecified eye, unspecified eyelid: Secondary | ICD-10-CM | POA: Diagnosis not present

## 2016-03-19 ENCOUNTER — Other Ambulatory Visit (INDEPENDENT_AMBULATORY_CARE_PROVIDER_SITE_OTHER): Payer: Medicare Other

## 2016-03-19 DIAGNOSIS — M549 Dorsalgia, unspecified: Secondary | ICD-10-CM

## 2016-03-19 LAB — SEDIMENTATION RATE: SED RATE: 2 mm/h (ref 0–20)

## 2016-03-26 ENCOUNTER — Telehealth: Payer: Self-pay | Admitting: Family Medicine

## 2016-03-26 MED ORDER — PREDNISONE 5 MG PO TABS: ORAL_TABLET | ORAL | Status: DC

## 2016-03-26 MED ORDER — PREDNISONE 1 MG PO TABS: ORAL_TABLET | ORAL | Status: DC

## 2016-03-26 NOTE — Telephone Encounter (Signed)
Patient states he received letter in the mail stating to reduce his Prednisone from 10 mg to 9 mg.  He needs a prescription called in for this.    [facility]: [facility] [address]

## 2016-03-26 NOTE — Telephone Encounter (Signed)
Medication sent in for patient. 

## 2016-03-27 ENCOUNTER — Other Ambulatory Visit: Payer: Self-pay | Admitting: Family Medicine

## 2016-03-27 MED ORDER — PREDNISONE 1 MG PO TABS: ORAL_TABLET | ORAL | Status: DC

## 2016-03-27 MED ORDER — PREDNISONE 5 MG PO TABS: ORAL_TABLET | ORAL | Status: DC

## 2016-04-08 DIAGNOSIS — H52223 Regular astigmatism, bilateral: Secondary | ICD-10-CM | POA: Diagnosis not present

## 2016-04-08 DIAGNOSIS — H2513 Age-related nuclear cataract, bilateral: Secondary | ICD-10-CM | POA: Diagnosis not present

## 2016-04-08 DIAGNOSIS — H02052 Trichiasis without entropian right lower eyelid: Secondary | ICD-10-CM | POA: Diagnosis not present

## 2016-04-08 DIAGNOSIS — H5203 Hypermetropia, bilateral: Secondary | ICD-10-CM | POA: Diagnosis not present

## 2016-04-08 DIAGNOSIS — H524 Presbyopia: Secondary | ICD-10-CM | POA: Diagnosis not present

## 2016-04-16 ENCOUNTER — Other Ambulatory Visit: Payer: Self-pay | Admitting: *Deleted

## 2016-04-23 ENCOUNTER — Ambulatory Visit (INDEPENDENT_AMBULATORY_CARE_PROVIDER_SITE_OTHER): Payer: Medicare Other | Admitting: Family Medicine

## 2016-04-23 VITALS — BP 130/82 | HR 64 | Temp 97.9°F | Ht 72.0 in | Wt 208.0 lb

## 2016-04-23 DIAGNOSIS — M353 Polymyalgia rheumatica: Secondary | ICD-10-CM

## 2016-04-23 NOTE — Patient Instructions (Signed)
Go ahead and decrease prednisone to 8 mg daily for one month and then continue with decrease by 1mg /month. Follow up for any breakthrough symptoms We will plan on repeat sed rate at follow up.

## 2016-04-23 NOTE — Progress Notes (Signed)
Pre visit review using our clinic review tool, if applicable. No additional management support is needed unless otherwise documented below in the visit note. 

## 2016-04-23 NOTE — Progress Notes (Signed)
Subjective:    Patient ID: [name]., male    DOB: [birth year], 82 y.o.   MRN: [id number]  HPI Follow-up polymyalgia rheumatica. No recent symptoms. Currently on prednisone 9 mg daily. Recent sedimentation rate in May was 2. He has not had any significant hyperglycemia. Recent labs per cardiology glucose was relatively normal range. He is monitoring this at home and most of his fasting blood sugars are less than 100. No headaches. No visual changes.  Past Medical History  Diagnosis Date  . Coronary artery disease     stent (promus) Cx/OM'09  . Prostatitis, acute   . Gout   . PVC (premature ventricular contraction)     hx  . Palpitations   . Hyperlipidemia   . Hypertension   . CAD 01/16/2009  . CHEST PAIN UNSPECIFIED 11/06/2009  . HYPERLIPIDEMIA 10/13/2007  . HYPERTENSION 10/13/2007  . 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 PALPITATIONS, HX OF 10/13/2007  . PROSTATITIS, ACUTE, HX OF 10/13/2007  . PSORIASIS 10/13/2007  . Pneumonia     "when I was a kid"  . GERD (gastroesophageal reflux disease)   . Osteoarthritis     "left shoulder" (08/23/2015)  . OSTEOARTHRITIS, ANKLE, RIGHT 10/13/2007  . Fibromyalgia dx'd ~ 04/2015  . SKIN CANCER, RECURRENT 10/13/2007  . HEARING LOSS     "temporary"   Past Surgical History  Procedure Laterality Date  . Vasectomy    . Appendectomy    . Cyst excision Right X 2    shoulder  . Electrolysis of misdirected lashes Bilateral     eyelashes are misdirected and grow inwards   . Tear duct probing Left   . Eye surgery    . Coronary angioplasty with stent placement    . Mass excision Right 01/2005    proximal thigh soft tissue mass  . Tympanoplasty Bilateral     "for hearing loss; put tubes in also, 2X on right, 1X on the left; tubes worked"    reports that he has never smoked. He has never used smokeless tobacco. He reports that he does not drink alcohol or use illicit drugs. family history includes Breast cancer in his  sister; Heart attack (age of onset: 60) in his father; Parkinsonism (age of onset: 73) in his mother. Allergies  Allergen Reactions  . Niacin       Review of Systems  Constitutional: Negative for fatigue.  Eyes: Negative for visual disturbance.  Respiratory: Negative for cough, chest tightness and shortness of breath.   Cardiovascular: Negative for chest pain, palpitations and leg swelling.  Musculoskeletal: Negative for myalgias and arthralgias.  Neurological: Negative for dizziness, syncope, weakness, light-headedness and headaches.       Objective:   Physical Exam  Constitutional: He is oriented to person, place, and time. He appears well-developed and well-nourished.  HENT:  Right Ear: External ear normal.  Left Ear: External ear normal.  Mouth/Throat: Oropharynx is clear and moist.  Eyes: Pupils are equal, round, and reactive to light.  Neck: Neck supple. No thyromegaly present.  Cardiovascular: Normal rate and regular rhythm.   Pulmonary/Chest: Effort normal and breath sounds normal. No respiratory distress. He has no wheezes. He has no rales.  Musculoskeletal: He exhibits no edema.  Neurological: He is alert and oriented to person, place, and time.          Assessment & Plan:  Polymyalgia rheumatica. Symptomatically stable. Reduce prednisone to 8 mg daily and continue tapering 1 mg per month. Will recheck  in 3 months and repeat sedimentation rate at that point. He knows to follow-up at any juncture if he has recurrent symptoms  Eulas Post MD [facility]

## 2016-05-05 ENCOUNTER — Other Ambulatory Visit: Payer: Self-pay | Admitting: Family Medicine

## 2016-05-19 ENCOUNTER — Other Ambulatory Visit: Payer: Self-pay | Admitting: Internal Medicine

## 2016-07-25 DIAGNOSIS — H02059 Trichiasis without entropian unspecified eye, unspecified eyelid: Secondary | ICD-10-CM | POA: Diagnosis not present

## 2016-07-25 NOTE — Progress Notes (Signed)
Cardiology Office Note   Date:  07/26/2016   ID:  [name]., DOB 17-Aug-1933, MRN [id number]  PCP:  Eulas Post, MD  Cardiologist:   Dorris Carnes, MD    F/U of CAD     History of Present Illness: [name]. is a 82 y.o. male with a history of  CAD (s/p PROMUS stent to LCx in 2009) Also a history of PVCs, HTN, HL He was admitted Srping 2016 weakness. R/O for MI Did have a low grade fever Echo done LVEF was 40 to 45% with diffuse hypokinesis, worse in the inferolateral wall    I saw the pt in 2017  Since seen he has done good  Breathing is OK  NO CP  No dizziness Has had some gasiness, Occaionally sees something bright red on stool  None covering toilet water   Has not had a colonoscopy in a long time     Outpatient Medications Prior to Visit  Medication Sig Dispense Refill  . aspirin 81 MG tablet Take 81 mg by mouth daily.      [name] atenolol (TENORMIN) 25 MG tablet TAKE ONE-HALF TABLET BY MOUTH ONCE DAILY 45 tablet 3  . lisinopril (PRINIVIL,ZESTRIL) 5 MG tablet Take 1 tablet (5 mg total) by mouth daily. 90 tablet 3  . nitroGLYCERIN (NITROSTAT) 0.4 MG SL tablet DISSOLVE ONE TABLET UNDER THE TONGUE EVERY 5 MINUTES AS NEEDED 25 tablet 12  . omeprazole (PRILOSEC) 20 MG capsule TAKE 1 CAPSULE DAILY 90 capsule 1  . predniSONE (DELTASONE) 1 MG tablet Take 4 (1mg ) tablets along with 1 (5mg ) tablet daily. 120 tablet 11  . predniSONE (DELTASONE) 5 MG tablet Take 1 (5mg ) tablet daily. 30 tablet 11  . rosuvastatin (CRESTOR) 10 MG tablet Take 1 tablet (10 mg total) by mouth daily. Prior to bedtime. 90 tablet 2  . triamterene-hydrochlorothiazide (MAXZIDE-25) 37.5-25 MG tablet Take 1 tablet by mouth daily.    [name] triamterene-hydrochlorothiazide (MAXZIDE-25) 37.5-25 MG tablet TAKE ONE TABLET BY MOUTH ONCE DAILY 90 tablet 0   No facility-administered medications prior to visit.      Allergies:   Niacin   Past Medical History:  Diagnosis Date  . CAD  01/16/2009  . CHEST PAIN UNSPECIFIED 11/06/2009  . Coronary artery disease    stent (promus) Cx/OM'09  . Fibromyalgia dx'd ~ 04/2015  . GERD (gastroesophageal reflux disease)   . Gout   . HEARING LOSS    "temporary"  . Hyperlipidemia   . HYPERLIPIDEMIA 10/13/2007  . Hypertension   . HYPERTENSION 10/13/2007  . Osteoarthritis    "left shoulder" (08/23/2015)  . OSTEOARTHRITIS, ANKLE, RIGHT 10/13/2007  . 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 Palpitations   . PALPITATIONS, HX OF 10/13/2007  . Pneumonia    "when I was a kid"  . Prostatitis, acute   . PROSTATITIS, ACUTE, HX OF 10/13/2007  . PSORIASIS 10/13/2007  . PVC (premature ventricular contraction)    hx  . SKIN CANCER, RECURRENT 10/13/2007    Past Surgical History:  Procedure Laterality Date  . APPENDECTOMY    . CORONARY ANGIOPLASTY WITH STENT PLACEMENT    . CYST EXCISION Right X 2   shoulder  . ELECTROLYSIS OF MISDIRECTED LASHES Bilateral    eyelashes are misdirected and grow inwards   . EYE SURGERY    . MASS EXCISION Right 01/2005   proximal thigh soft tissue mass  . TEAR DUCT PROBING Left   . TYMPANOPLASTY Bilateral    "for  hearing loss; put tubes in also, 2X on right, 1X on the left; tubes worked"  . VASECTOMY       Social History:  The patient  reports that he has never smoked. He has never used smokeless tobacco. He reports that he does not drink alcohol or use drugs.   Family History:  The patient's family history includes Breast cancer in his sister; Heart attack (age of onset: 33) in his father; Parkinsonism (age of onset: 65) in his mother.    ROS:  Please see the history of present illness. All other systems are reviewed and  Negative to the above problem except as noted.    PHYSICAL EXAM: VS:  BP 128/84   Pulse 64   Ht 6' (1.829 m)   Wt 205 lb 12.8 oz (93.4 kg)   BMI 27.91 kg/m   GEN: Well nourished, well developed, in no acute distress  HEENT: normal  Neck: no JVD, carotid bruits, or  masses Cardiac: RRR; no murmurs, rubs, or gallops,no edema  Respiratory:  clear to auscultation bilaterally, normal work of breathing GI: soft, nontender, nondistended, + BS  No hepatomegaly  MS: no deformity Moving all extremities   Skin: warm and dry, no rash Neuro:  Strength and sensation are intact Psych: euthymic mood, full affect   EKG:  EKG is ordered today.   SR  64 RBBB.  LAFB     Lipid Panel    Component Value Date/Time   CHOL 141 01/22/2016 0952   TRIG 149 01/22/2016 0952   HDL 46 01/22/2016 0952   CHOLHDL 3.1 01/22/2016 0952   VLDL 30 01/22/2016 0952   LDLCALC 65 01/22/2016 0952   LDLDIRECT 128.0 11/16/2007 1454      Wt Readings from Last 3 Encounters:  07/26/16 205 lb 12.8 oz (93.4 kg)  04/23/16 208 lb (94.3 kg)  01/22/16 208 lb 1.9 oz (94.4 kg)      ASSESSMENT AND PLAN:  1  CAD  No sympotms of angina    2HTN  BP is OK    3  HL    Continue Crestor     Current medicines are reviewed at length with the patient today.  The patient does not have concerns regarding medicines.  The following changes have been made:   Labs/ tests ordered today include:  Orders Placed This Encounter  Procedures  . CBC  . Basic metabolic panel  . PSA  . EKG 12-Lead     Disposition:   FU with  in  May    Signed, Dorris Carnes, MD  07/26/2016 10:44 AM    [facility] [place], [place], [place]  [address] Phone: [phone]; Fax: [phone]

## 2016-07-26 ENCOUNTER — Ambulatory Visit (INDEPENDENT_AMBULATORY_CARE_PROVIDER_SITE_OTHER): Payer: Medicare Other | Admitting: Internal Medicine

## 2016-07-26 ENCOUNTER — Encounter: Payer: Self-pay | Admitting: Internal Medicine

## 2016-07-26 VITALS — BP 128/84 | HR 64 | Ht 72.0 in | Wt 205.8 lb

## 2016-07-26 DIAGNOSIS — I10 Essential (primary) hypertension: Secondary | ICD-10-CM | POA: Diagnosis not present

## 2016-07-26 DIAGNOSIS — I2510 Atherosclerotic heart disease of native coronary artery without angina pectoris: Secondary | ICD-10-CM | POA: Diagnosis not present

## 2016-07-26 DIAGNOSIS — I493 Ventricular premature depolarization: Secondary | ICD-10-CM | POA: Diagnosis not present

## 2016-07-26 DIAGNOSIS — R3911 Hesitancy of micturition: Secondary | ICD-10-CM

## 2016-07-26 LAB — CBC
HCT: 48.2 % (ref 38.5–50.0)
Hemoglobin: 16.1 g/dL (ref 13.2–17.1)
MCH: 32.8 pg (ref 27.0–33.0)
MCHC: 33.4 g/dL (ref 32.0–36.0)
MCV: 98.2 fL (ref 80.0–100.0)
MPV: 10.3 fL (ref 7.5–12.5)
PLATELETS: 194 10*3/uL (ref 140–400)
RBC: 4.91 MIL/uL (ref 4.20–5.80)
RDW: 14 % (ref 11.0–15.0)
WBC: 7.5 10*3/uL (ref 3.8–10.8)

## 2016-07-26 LAB — BASIC METABOLIC PANEL
BUN: 19 mg/dL (ref 7–25)
CALCIUM: 8.9 mg/dL (ref 8.6–10.3)
CHLORIDE: 103 mmol/L (ref 98–110)
CO2: 29 mmol/L (ref 20–31)
Creat: 1.27 mg/dL — ABNORMAL HIGH (ref 0.70–1.11)
Glucose, Bld: 90 mg/dL (ref 65–99)
Potassium: 4.1 mmol/L (ref 3.5–5.3)
SODIUM: 140 mmol/L (ref 135–146)

## 2016-07-26 LAB — PSA: PSA: 1.6 ng/mL (ref <=4.0)

## 2016-07-26 NOTE — Patient Instructions (Signed)
Your physician recommends that you continue on your current medications as directed. Please refer to the Current Medication list given to you today. Your physician recommends that you return for lab work today (PSA, BMET, CBC) Your physician wants you to follow-up in: May 2018 with Dr. Harrington Challenger.  You will receive a reminder letter in the mail two months in advance. If you don't receive a letter, please call our office to schedule the follow-up appointment.

## 2016-07-30 ENCOUNTER — Ambulatory Visit (INDEPENDENT_AMBULATORY_CARE_PROVIDER_SITE_OTHER): Payer: Medicare Other | Admitting: Family Medicine

## 2016-07-30 DIAGNOSIS — Z23 Encounter for immunization: Secondary | ICD-10-CM

## 2016-08-04 ENCOUNTER — Other Ambulatory Visit: Payer: Self-pay | Admitting: Internal Medicine

## 2016-08-18 ENCOUNTER — Other Ambulatory Visit: Payer: Self-pay | Admitting: Internal Medicine

## 2016-09-08 ENCOUNTER — Other Ambulatory Visit: Payer: Self-pay | Admitting: Internal Medicine

## 2016-09-13 ENCOUNTER — Encounter: Payer: Self-pay | Admitting: Family Medicine

## 2016-09-13 ENCOUNTER — Ambulatory Visit (INDEPENDENT_AMBULATORY_CARE_PROVIDER_SITE_OTHER): Payer: Medicare Other | Admitting: Family Medicine

## 2016-09-13 VITALS — BP 118/70 | HR 74 | Temp 97.5°F | Ht 72.0 in | Wt 207.4 lb

## 2016-09-13 DIAGNOSIS — I2510 Atherosclerotic heart disease of native coronary artery without angina pectoris: Secondary | ICD-10-CM | POA: Diagnosis not present

## 2016-09-13 DIAGNOSIS — I10 Essential (primary) hypertension: Secondary | ICD-10-CM | POA: Diagnosis not present

## 2016-09-13 DIAGNOSIS — K219 Gastro-esophageal reflux disease without esophagitis: Secondary | ICD-10-CM | POA: Diagnosis not present

## 2016-09-13 DIAGNOSIS — M353 Polymyalgia rheumatica: Secondary | ICD-10-CM

## 2016-09-13 LAB — SEDIMENTATION RATE: Sed Rate: 3 mm/hr (ref 0–20)

## 2016-09-13 NOTE — Progress Notes (Signed)
Pre visit review using our clinic review tool, if applicable. No additional management support is needed unless otherwise documented below in the visit note. 

## 2016-09-13 NOTE — Progress Notes (Signed)
Subjective:     Patient ID: [name]., male   DOB: 03/28/1933, 82 y.o.   MRN: [id number]  HPI Patient seen for medical follow-up  History polymyalgia rheumatica which was diagnosed little over year ago. He is currently down to 3 mg of prednisone daily and has had no breakthrough symptoms. Our plan was to repeat sedimentation rate today and if still stable continue tapering 1 mg increment per month and after getting to 2 mg to taper off.  Long history of GERD. Has been taking Prilosec but somewhat inconsistently recently. He had concerns because some recent new's reports about possible adverse issues including memory loss. He has not had any history of esophageal stricture or Barrett's esophagus changes. With lifestyle modification, his symptoms been very stable.  He has history of CAD followed by cardiology. Medications reviewed. He remains on aspirin, atenolol, lisinopril, Crestor, Maxzide in addition to the omeprazole and prednisone  Past Medical History:  Diagnosis Date  . CAD 01/16/2009  . CHEST PAIN UNSPECIFIED 11/06/2009  . Coronary artery disease    stent (promus) Cx/OM'09  . Fibromyalgia dx'd ~ 04/2015  . GERD (gastroesophageal reflux disease)   . Gout   . HEARING LOSS    "temporary"  . Hyperlipidemia   . HYPERLIPIDEMIA 10/13/2007  . Hypertension   . HYPERTENSION 10/13/2007  . Osteoarthritis    "left shoulder" (08/23/2015)  . OSTEOARTHRITIS, ANKLE, RIGHT 10/13/2007  . 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 Palpitations   . PALPITATIONS, HX OF 10/13/2007  . Pneumonia    "when I was a kid"  . Prostatitis, acute   . PROSTATITIS, ACUTE, HX OF 10/13/2007  . PSORIASIS 10/13/2007  . PVC (premature ventricular contraction)    hx  . SKIN CANCER, RECURRENT 10/13/2007   Past Surgical History:  Procedure Laterality Date  . APPENDECTOMY    . CORONARY ANGIOPLASTY WITH STENT PLACEMENT    . CYST EXCISION Right X 2   shoulder  . ELECTROLYSIS OF MISDIRECTED  LASHES Bilateral    eyelashes are misdirected and grow inwards   . EYE SURGERY    . MASS EXCISION Right 01/2005   proximal thigh soft tissue mass  . TEAR DUCT PROBING Left   . TYMPANOPLASTY Bilateral    "for hearing loss; put tubes in also, 2X on right, 1X on the left; tubes worked"  . VASECTOMY      reports that he has never smoked. He has never used smokeless tobacco. He reports that he does not drink alcohol or use drugs. family history includes Breast cancer in his sister; Heart attack (age of onset: 25) in his father; Parkinsonism (age of onset: 34) in his mother. Allergies  Allergen Reactions  . Niacin      Review of Systems  Constitutional: Negative for fatigue.  Eyes: Negative for visual disturbance.  Respiratory: Negative for cough, chest tightness and shortness of breath.   Cardiovascular: Negative for chest pain, palpitations and leg swelling.  Neurological: Negative for dizziness, syncope, weakness, light-headedness and headaches.       Objective:   Physical Exam  Constitutional: He is oriented to person, place, and time. He appears well-developed and well-nourished.  HENT:  Right Ear: External ear normal.  Left Ear: External ear normal.  Mouth/Throat: Oropharynx is clear and moist.  Eyes: Pupils are equal, round, and reactive to light.  Neck: Neck supple. No thyromegaly present.  Cardiovascular: Normal rate and regular rhythm.   Pulmonary/Chest: Effort normal and breath sounds normal. No respiratory  distress. He has no wheezes. He has no rales.  Musculoskeletal: He exhibits no edema.  Neurological: He is alert and oriented to person, place, and time.       Assessment:     #1 polymyalgia rheumatica stable currently on prednisone 3 mg daily.  #2 GERD currently stable  #3 history of hypertension stable and at goal     Plan:     -Try tapering Prilosec back to every other day and then if stable after 2-3 weeks consider stopping altogether. He does not have  clear indication for chronic use. -He'll try transitioning over to over-the-counter Zantac or Pepcid -Repeat sedimentation rate today. If stable, taper prednisone to 2 mg daily for 1 month then try discontinuing altogether -We discussed lifestyle management of GERD. He avoids eating within 3-4 hours at bedtime -In follow-up 6 months  Eulas Post MD [facility]

## 2016-09-13 NOTE — Patient Instructions (Addendum)
Try to reduce the Prilosec to every other day and then discontinue in 2-3 weeks if no breakthrough symptoms Consider OTC Zantac (150 mg) or Pepcid (20 mg).

## 2016-09-27 ENCOUNTER — Other Ambulatory Visit: Payer: Self-pay | Admitting: Physician Assistant

## 2016-09-27 ENCOUNTER — Emergency Department (HOSPITAL_COMMUNITY): Payer: Medicare Other

## 2016-09-27 ENCOUNTER — Emergency Department (HOSPITAL_COMMUNITY)
Admission: EM | Admit: 2016-09-27 | Discharge: 2016-09-27 | Disposition: A | Discharge status: Home / Self Care | Payer: Medicare Other | Attending: Emergency Medicine | Admitting: Emergency Medicine

## 2016-09-27 ENCOUNTER — Encounter (HOSPITAL_COMMUNITY): Payer: Self-pay

## 2016-09-27 ENCOUNTER — Other Ambulatory Visit: Payer: Self-pay | Admitting: Student

## 2016-09-27 DIAGNOSIS — I10 Essential (primary) hypertension: Secondary | ICD-10-CM | POA: Diagnosis not present

## 2016-09-27 DIAGNOSIS — I25119 Atherosclerotic heart disease of native coronary artery with unspecified angina pectoris: Secondary | ICD-10-CM

## 2016-09-27 DIAGNOSIS — Z7982 Long term (current) use of aspirin: Secondary | ICD-10-CM | POA: Diagnosis not present

## 2016-09-27 DIAGNOSIS — R079 Chest pain, unspecified: Secondary | ICD-10-CM | POA: Diagnosis not present

## 2016-09-27 DIAGNOSIS — K219 Gastro-esophageal reflux disease without esophagitis: Secondary | ICD-10-CM | POA: Diagnosis not present

## 2016-09-27 DIAGNOSIS — Z955 Presence of coronary angioplasty implant and graft: Secondary | ICD-10-CM | POA: Insufficient documentation

## 2016-09-27 DIAGNOSIS — R0789 Other chest pain: Secondary | ICD-10-CM | POA: Diagnosis not present

## 2016-09-27 DIAGNOSIS — Z85828 Personal history of other malignant neoplasm of skin: Secondary | ICD-10-CM | POA: Diagnosis not present

## 2016-09-27 DIAGNOSIS — I119 Hypertensive heart disease without heart failure: Secondary | ICD-10-CM | POA: Diagnosis not present

## 2016-09-27 DIAGNOSIS — R072 Precordial pain: Secondary | ICD-10-CM

## 2016-09-27 DIAGNOSIS — M353 Polymyalgia rheumatica: Secondary | ICD-10-CM | POA: Diagnosis present

## 2016-09-27 DIAGNOSIS — E782 Mixed hyperlipidemia: Secondary | ICD-10-CM | POA: Diagnosis not present

## 2016-09-27 DIAGNOSIS — I2510 Atherosclerotic heart disease of native coronary artery without angina pectoris: Secondary | ICD-10-CM | POA: Insufficient documentation

## 2016-09-27 DIAGNOSIS — Z79899 Other long term (current) drug therapy: Secondary | ICD-10-CM | POA: Diagnosis not present

## 2016-09-27 DIAGNOSIS — E785 Hyperlipidemia, unspecified: Secondary | ICD-10-CM | POA: Diagnosis present

## 2016-09-27 DIAGNOSIS — I493 Ventricular premature depolarization: Secondary | ICD-10-CM | POA: Diagnosis present

## 2016-09-27 LAB — BASIC METABOLIC PANEL
ANION GAP: 7 (ref 5–15)
BUN: 16 mg/dL (ref 6–20)
CALCIUM: 9.3 mg/dL (ref 8.9–10.3)
CO2: 27 mmol/L (ref 22–32)
Chloride: 107 mmol/L (ref 101–111)
Creatinine, Ser: 1.29 mg/dL — ABNORMAL HIGH (ref 0.61–1.24)
GFR calc Af Amer: 58 mL/min — ABNORMAL LOW (ref >60)
GFR, EST NON AFRICAN AMERICAN: 50 mL/min — AB (ref >60)
Glucose, Bld: 114 mg/dL — ABNORMAL HIGH (ref 65–99)
POTASSIUM: 4 mmol/L (ref 3.5–5.1)
SODIUM: 141 mmol/L (ref 135–145)

## 2016-09-27 LAB — I-STAT TROPONIN, ED
TROPONIN I, POC: 0 ng/mL (ref 0.00–0.08)
TROPONIN I, POC: 0 ng/mL (ref 0.00–0.08)
Troponin i, poc: 0 ng/mL (ref 0.00–0.08)

## 2016-09-27 LAB — CBC
HEMATOCRIT: 45.3 % (ref 39.0–52.0)
Hemoglobin: 15.3 g/dL (ref 13.0–17.0)
MCH: 32.6 pg (ref 26.0–34.0)
MCHC: 33.8 g/dL (ref 30.0–36.0)
MCV: 96.4 fL (ref 78.0–100.0)
Platelets: 203 10*3/uL (ref 150–400)
RBC: 4.7 MIL/uL (ref 4.22–5.81)
RDW: 13.6 % (ref 11.5–15.5)
WBC: 7.4 10*3/uL (ref 4.0–10.5)

## 2016-09-27 MED ORDER — ASPIRIN 81 MG PO CHEW
324.0000 mg | CHEWABLE_TABLET | Freq: Once | ORAL | Status: AC
  Administered 2016-09-27: 324 mg via ORAL
  Filled 2016-09-27: qty 4

## 2016-09-27 MED ORDER — RANITIDINE HCL 150 MG PO TABS
150.0000 mg | ORAL_TABLET | Freq: Two times a day (BID) | ORAL | 0 refills | Status: DC
Start: 2016-09-27 — End: 2016-11-11

## 2016-09-27 NOTE — Consult Note (Signed)
CARDIOLOGY CONSULT NOTE   Patient ID: [name]. MRN: [id number] DOB/AGE: [birth year] 82 y.o.  Admit date: 09/27/2016  Requesting Physician: Dr. Canary [name]  Primary Physician:   Jacob Post, MD Primary Cardiologist:   Dr. Harrington [name] Reason for Consultation:  Chest pain  HPI: [name]. is a 82 y.o. male with a history of CAD s/p DES to LCx (2009), PVCs, HTN, CKD, HLD, polymyalgia rheumatica on low dose prednisone and chronic systolic CHF (EF [id number]) who presented to [facility] ED today with chest pain.   HE has a hx of CAD s/p DES to LCx (2009) after an abnormal nuclear stress test.   He recently stopped his Prilosec because he is worried about this medicine causing dementia. He has been off this for about 1 week.   He was in his usual state of health until last night when he woke up at 3:30am with dizziness and gas in his stomach. He took a tums and went back to sleep. He then woke up with a sharp and stabbing chest pain. 10/10 pain associated with diaphoresis. It felt better with belching. No radiation. He took 1 SL NTG and it almost went down to a 0. It then came back and he took 2 more SL NTG. His wife took him to the ER and by the time arrival he was chest pain free.   He denies exertional chest pain or SOB. He walks about 30 minutes everyday at [facility] with no problems. NO LE edema, orthopnea or PND. No dizziness or syncope. Currently feels well and would like to go home if possible.   Past Medical History:  Diagnosis Date  . CAD 01/16/2009  . CHEST PAIN UNSPECIFIED 11/06/2009  . Coronary artery disease    stent (promus) Cx/OM'09  . Fibromyalgia dx'd ~ 04/2015  . GERD (gastroesophageal reflux disease)   . Gout   . HEARING LOSS    "temporary"  . Hyperlipidemia   . HYPERLIPIDEMIA 10/13/2007  . Hypertension   . HYPERTENSION 10/13/2007  . Osteoarthritis    "left shoulder" (08/23/2015)  . OSTEOARTHRITIS, ANKLE, RIGHT 10/13/2007  . Other acquired absence of  organ 10/13/2007  . Palpitations   . PALPITATIONS, HX OF 10/13/2007  . Pneumonia    "when I was a kid"  . Prostatitis, acute   . PROSTATITIS, ACUTE, HX OF 10/13/2007  . PSORIASIS 10/13/2007  . PVC (premature ventricular contraction)    hx  . SKIN CANCER, RECURRENT 10/13/2007     Past Surgical History:  Procedure Laterality Date  . APPENDECTOMY    . CORONARY ANGIOPLASTY WITH STENT PLACEMENT    . CYST EXCISION Right X 2   shoulder  . ELECTROLYSIS OF MISDIRECTED LASHES Bilateral    eyelashes are misdirected and grow inwards   . EYE SURGERY    . MASS EXCISION Right 01/2005   proximal thigh soft tissue mass  . TEAR DUCT PROBING Left   . TYMPANOPLASTY Bilateral    "for hearing loss; put tubes in also, 2X on right, 1X on the left; tubes worked"  . VASECTOMY      Allergies  Allergen Reactions  . Niacin Hives and Rash    I have reviewed the patient's current medications     Prior to Admission medications   Medication Sig Start Date End Date Taking? Authorizing Provider  aspirin 81 MG tablet Take 81 mg by mouth daily.     Yes Historical Provider, MD  atenolol (TENORMIN) 25 MG tablet  TAKE ONE-HALF TABLET BY MOUTH ONCE DAILY 10/02/15  Yes Fay Records, MD  lisinopril (PRINIVIL,ZESTRIL) 5 MG tablet Take 1 tablet (5 mg total) by mouth daily. 09/08/15  Yes Fay Records, MD  nitroGLYCERIN (NITROSTAT) 0.4 MG SL tablet DISSOLVE ONE TABLET UNDER THE TONGUE EVERY 5 MINUTES AS NEEDED Patient taking differently: Place 0.4 mg under the tongue every 5 (five) minutes as needed for chest pain.  09/20/15  Yes Fay Records, MD  omeprazole (PRILOSEC) 20 MG capsule Take 20 mg by mouth every other day. 08/17/16  Yes Historical Provider, MD  predniSONE (DELTASONE) 1 MG tablet Take 2.5 mg by mouth daily with breakfast.   Yes Historical Provider, MD  rosuvastatin (CRESTOR) 10 MG tablet Take one tablet by mouth daily prior to bedtime Patient taking differently: Take 10 mg by mouth at bedtime.  08/05/16   Yes Fay Records, MD  triamterene-hydrochlorothiazide (MAXZIDE-25) 37.5-25 MG tablet Take 1 tablet by mouth daily.   Yes Historical Provider, MD     Social History   Social History  . Marital status: Married    Spouse name: N/A  . Number of children: 3  . Years of education: N/A   Occupational History  . retired Retired    from [employer].   Social History Main Topics  . Smoking status: Never Smoker  . Smokeless tobacco: Never Used  . Alcohol use No  . Drug use: No  . Sexual activity: No   Other Topics Concern  . Not on file   Social History Narrative   Married 1958   2 daughters- '59, 68, 1 son '61   8 grandchildren   Retired from [employer], works PT as a Curator. Now retired completely (09/2008)   End of life; does not want heroic measures if in a persistent vegative state    Family Status  Relation Status  . Father Deceased at age 33   MI  . Mother Deceased at age 54  . Sister Deceased  . Maternal Aunt Deceased at age 51   MI  . Paternal Uncle Deceased at age 60   MI  .     Family History  Problem Relation Age of Onset  . Heart attack Father 67    died  . Parkinsonism Mother 88    died  . Breast cancer Sister     died  . Heart attack  50    paternal uncle died/maternal aunt died  . Lung cancer      uncle died    ROS:  Full 14 point review of systems complete and found to be negative unless listed above.  Physical Exam: Blood pressure 117/72, pulse (!) 52, temperature 97.4 F (36.3 C), temperature source Oral, resp. rate 16, height 6' (1.829 m), weight 207 lb (93.9 kg), SpO2 98 %.  General: Well developed, well nourished, male in no acute distress Head: Eyes PERRLA, No xanthomas.   Normocephalic and atraumatic, oropharynx without edema or exudate. :  Lungs: CTAB Heart: HRRR S1 S2, no rub/gallop, Heart regular rate and rhythm with S1, S2  murmur. pulses are 2+ extrem.   Neck: No carotid bruits. No lymphadenopathy.  No JVD. Abdomen: Bowel sounds present,  abdomen soft and non-tender without masses or hernias noted. Msk:  No spine or cva tenderness. No weakness, no joint deformities or effusions. Extremities: No clubbing or cyanosis. No LE  edema.  Neuro: Alert and oriented X 3. No focal deficits noted. Psych:  Good affect, responds appropriately Skin:  No rashes or lesions noted.  Labs:   Lab Results  Component Value Date   WBC 7.4 09/27/2016   HGB 15.3 09/27/2016   HCT 45.3 09/27/2016   MCV 96.4 09/27/2016   PLT 203 09/27/2016   No results for input(s): INR in the last 72 hours.   Recent Labs Lab 09/27/16 0800  NA 141  K 4.0  CL 107  CO2 27  BUN 16  CREATININE 1.29*  CALCIUM 9.3  GLUCOSE 114*   No results found for: MG No results for input(s): CKTOTAL, CKMB, TROPONINI in the last 72 hours.  Recent Labs  09/27/16 0809 09/27/16 1055  TROPIPOC 0.00 0.00   Pro B Natriuretic peptide (BNP)  Date/Time Value Ref Range Status  09/05/2014 11:54 AM 37.0 0.0 - 100.0 pg/mL Final   Lab Results  Component Value Date   CHOL 141 01/22/2016   HDL 46 01/22/2016   LDLCALC 65 01/22/2016   TRIG 149 01/22/2016   No results found for: DDIMER No results found for: LIPASE, AMYLASE TSH  Date/Time Value Ref Range Status  01/22/2016 09:52 AM 3.59 0.40 - 4.50 mIU/L Final   No results found for: VITAMINB12, FOLATE, FERRITIN, TIBC, IRON, RETICCTPCT  Echo:: 08/24/2015 LV EF: 40% -   45% Study Conclusions - Left ventricle: The cavity size was normal. Wall thickness was   normal. Systolic function was mildly to moderately reduced. The   estimated ejection fraction was in the range of 40% to 45%.   Diffuse hypokinesis that appears worse inferolaterally. Doppler   parameters are consistent with abnormal left ventricular   relaxation (grade 1 diastolic dysfunction). - Aortic valve: There was trivial regurgitation. - Aorta: Mildly dilated aortic root. Aortic root dimension: 38 mm   (ED). - Mitral valve: Mildly calcified annulus. There  was no significant   regurgitation. - Right ventricle: The cavity size was mildly dilated. Systolic   function was mildly reduced. - Pulmonary arteries: No complete TR doppler jet so unable to   estimate PA systolic pressure. - Inferior vena cava: The vessel was normal in size. The   respirophasic diameter changes were in the normal range (= 50%),   consistent with normal central venous pressure. Impressions: - Normal LV size with EF 40-45%. Diffuse hypokinesis worse   inferolaterally. Mildly dilated RV with mildly decreased systolic   function. No significant valvular abnormalities.   CATH 2009  RESULTS:  Initially, the stenosis in the circumflex marginal vessel was  estimated at 90%.  There was a fairly large mismatch between the caliber  of the vessel proximal lesion and distal lesion.  This remained despite  nitroglycerin.  Following stenting, the stenosis improved to 0%.   CONCLUSION:  Successful PCI of the lesion in the circumflex marginal  vessel using a Promus drug-eluting stent with improvement of center  narrowing from 90% to 0%.   DISPOSITION:  The patient was taken to the [name]-anesthesia care unit for  further observation.  The patient had some mild residual pain  postprocedure and his postprocedure ECG showed some minor inferior ST  changes.  This  occurred despite the fact that he had a good anatomic result and good  flow down the circumflex artery.  Possibly he may have had some distal  embolization, although this was not visualized angiographically.  We  will plan discharge  tomorrow if he remains stable.   ECG:  Normal sinus rhythm with sinus arrhythmia HR 66. RBBB and FAFB  Radiology:  Dg Chest 2 View  Result Date: 09/27/2016 CLINICAL DATA:  Chest pain EXAM: CHEST  2 VIEW COMPARISON:  August 23, 2015 FINDINGS: There is mild atelectasis in the left base. There is no edema or consolidation. The heart size pulmonary vascularity are normal. No adenopathy. There  is atherosclerotic calcification in the aorta. There is degenerative change in portions of the thoracic spine. IMPRESSION: Mild left base atelectasis. No edema or consolidation. Stable cardiac silhouette. Aortic atherosclerosis Electronically Signed   By: Lowella Grip III M.D.   On: 09/27/2016 08:18    ASSESSMENT AND PLAN:    Principal Problem:   Chest pain Active Problems:   Hyperlipidemia   Essential hypertension   PVC (premature ventricular contraction)   GERD (gastroesophageal reflux disease)   PMR (polymyalgia rheumatica) (HCC)  Jacob Starwalt. is a 82 y.o. male with a history of CAD s/p DES to LCx (2009), PVCs, HTN, CKD, HLD, polymyalgia rheumatica on low dose prednisone and chronic systolic CHF (EF [id number]) who presented to [facility] ED today with chest pain.   Chest pain: no objective signs of ischemia at this point. He is currently chest pain free and would like to go home. Plan will likely be outpatient nuclear stress test and discharge home from the ER. Also, interesting that it improved with belching and he recently stopped his PPI (for concerns of it causing dementia). We could trial H2 blocker -- Continue asa, statin and BB  HLD: continue statin  HTN: BP well controlled.   PMR: tapering off low dose steroids.  LV dysfunction: no hx of clinical CHF. Continue ACE and BB and HCTZ  Signed: Angelena Form, PA-C 09/27/2016 11:42 AM  Pager [phone]  Co-Sign MD  I have examined the patient and reviewed assessment and plan and discussed with patient.  Agree with above as stated.  Now pain free.  He will start ranitidine.  He feels that sx that brought him to the ER are different than what he had before his stent.  Negative troponin.  No change on ECG.  Continue regular medications.  F/u next week to consider stress testing.    [name]

## 2016-09-27 NOTE — ED Triage Notes (Signed)
Pt. Reports the chest pain woke him up last night around 330 am.  Described as sharp, non-radiating chest pain. Denies any sob or nausea.  Pt. Does report being sweaty.  Pt. Has a hx of stent.  Pt. Took 3 nitros this morning pain is 1 out of 10.  Skin is p/w/d,. ECG completed in Triage.

## 2016-09-27 NOTE — ED Provider Notes (Signed)
[facility] DEPT Provider Note   CSN: [id number] Arrival date & time: 09/27/16  [id number]     History   Chief Complaint No chief complaint on file.   HPI [name]. is a 82 y.o. male.  HPI  Pt with hx of cardiac stent presents with chest pain.  He states chest pain awoke him from sleep approx 3:30am.  He took nitroglycerin approx every hour x 3 and chest pain resolved.  He felt the need to burp which helped pain somewhat.  No nausea, no shorntess of breath, he did feel sweaty and somewhat dizzy. [name]  He denies radiation of pain.  Pain described as a tightness, aching in center of chest.  He states the stent was placed about 10 years ago he occasionally has chest pain, but it does not usually last this long- usually responds to just one nitroglycerin.  No leg swelling.  No recent illness- no fever/chills/cough.  There are no other associated systemic symptoms, there are no other alleviating or modifying factors.   Past Medical History:  Diagnosis Date  . Coronary artery disease    a. stent (promus) Cx/OM'09  . Fibromyalgia dx'd ~ 04/2015  . GERD (gastroesophageal reflux disease)   . Gout   . HEARING LOSS    "temporary"  . Hyperlipidemia   . HYPERLIPIDEMIA 10/13/2007  . Hypertension   . HYPERTENSION 10/13/2007  . Osteoarthritis    "left shoulder" (08/23/2015)  . Prostatitis, acute   . PROSTATITIS, ACUTE, HX OF 10/13/2007  . PSORIASIS 10/13/2007  . PVC (premature ventricular contraction)    hx  . SKIN CANCER, RECURRENT 10/13/2007    Patient Active Problem List   Diagnosis Date Noted  . Coronary artery disease   . IFG (impaired fasting glucose) 10/03/2015  . PMR (polymyalgia rheumatica) (HCC) 09/07/2015  . Orthostatic hypotension 08/24/2015  . Viral syndrome 08/24/2015  . GERD (gastroesophageal reflux disease) 03/24/2014  . ACP (advance care planning) 11/02/2013  . Left upper arm pain 09/15/2012  . Routine health maintenance 10/29/2011  . HEARING LOSS  11/05/2010  . Chest pain 11/06/2009  . Coronary atherosclerosis 01/16/2009  . SKIN CANCER, RECURRENT 10/13/2007  . Hyperlipidemia 10/13/2007  . Gout 10/13/2007  . Essential hypertension 10/13/2007  . PVC (premature ventricular contraction) 10/13/2007  . PSORIASIS 10/13/2007  . OSTEOARTHRITIS, ANKLE, RIGHT 10/13/2007  . Other acquired absence of organ 10/13/2007    Past Surgical History:  Procedure Laterality Date  . APPENDECTOMY    . CORONARY ANGIOPLASTY WITH STENT PLACEMENT    . CYST EXCISION Right X 2   shoulder  . ELECTROLYSIS OF MISDIRECTED LASHES Bilateral    eyelashes are misdirected and grow inwards   . EYE SURGERY    . MASS EXCISION Right 01/2005   proximal thigh soft tissue mass  . TEAR DUCT PROBING Left   . TYMPANOPLASTY Bilateral    "for hearing loss; put tubes in also, 2X on right, 1X on the left; tubes worked"  . VASECTOMY         Home Medications    Prior to Admission medications   Medication Sig Start Date End Date Taking? Authorizing Provider  aspirin 81 MG tablet Take 81 mg by mouth daily.     Yes Historical Provider, MD  atenolol (TENORMIN) 25 MG tablet TAKE ONE-HALF TABLET BY MOUTH ONCE DAILY 10/02/15  Yes Fay Records, MD  lisinopril (PRINIVIL,ZESTRIL) 5 MG tablet Take 1 tablet (5 mg total) by mouth daily. 09/08/15  Yes Fay Records, MD  nitroGLYCERIN (NITROSTAT) 0.4 MG SL tablet DISSOLVE ONE TABLET UNDER THE TONGUE EVERY 5 MINUTES AS NEEDED Patient taking differently: Place 0.4 mg under the tongue every 5 (five) minutes as needed for chest pain.  09/20/15  Yes Fay Records, MD  omeprazole (PRILOSEC) 20 MG capsule Take 20 mg by mouth every other day. 08/17/16  Yes Historical Provider, MD  predniSONE (DELTASONE) 1 MG tablet Take 2.5 mg by mouth daily with breakfast.   Yes Historical Provider, MD  rosuvastatin (CRESTOR) 10 MG tablet Take one tablet by mouth daily prior to bedtime Patient taking differently: Take 10 mg by mouth at bedtime.  08/05/16  Yes  Fay Records, MD  triamterene-hydrochlorothiazide (MAXZIDE-25) 37.5-25 MG tablet Take 1 tablet by mouth daily.   Yes Historical Provider, MD  ranitidine (ZANTAC) 150 MG tablet Take 1 tablet (150 mg total) by mouth 2 (two) times daily. 09/27/16   Alfonzo Beers, MD    Family History Family History  Problem Relation Age of Onset  . Heart attack Father 72    died  . Parkinsonism Mother 38    died  . Breast cancer Sister     died  . Heart attack  50    paternal uncle died/maternal aunt died  . Lung cancer      uncle died    Social History Social History  Substance Use Topics  . Smoking status: Never Smoker  . Smokeless tobacco: Never Used  . Alcohol use No     Allergies   Niacin   Review of Systems Review of Systems  ROS reviewed and all otherwise negative except for mentioned in HPI   Physical Exam Updated Vital Signs BP 124/69   Pulse (!) 54   Temp 97.4 F (36.3 C) (Oral)   Resp 12   Ht 6' (1.829 m)   Wt 93.9 kg   SpO2 98%   BMI 28.07 kg/m  Vitals reviewed Physical Exam Physical Examination: General appearance - alert, well appearing, and in no distress Mental status - alert, oriented to person, place, and time Eyes - no conjunctival injection, no scleral icterus Chest - clear to auscultation, no wheezes, rales or rhonchi, symmetric air entry Heart - normal rate, regular rhythm, normal S1, S2, no murmurs, rubs, clicks or gallops Abdomen - soft, nontender, nondistended, no masses or organomegaly Neurological - alert, oriented, normal speech Extremities - peripheral pulses normal, no pedal edema, no clubbing or cyanosis Skin - normal coloration and turgor, no rashes  ED Treatments / Results  Labs (all labs ordered are listed, but only abnormal results are displayed) Labs Reviewed  BASIC METABOLIC PANEL - Abnormal; Notable for the following:       Result Value   Glucose, Bld 114 (*)    Creatinine, Ser 1.29 (*)    GFR calc non Af Amer 50 (*)    GFR calc  Af Amer 58 (*)    All other components within normal limits  CBC  I-STAT TROPOININ, ED  I-STAT TROPOININ, ED  I-STAT TROPOININ, ED    EKG EKG  EKG Interpretation  Date/Time:  Friday September 27 2016 07:56:39 EST Ventricular Rate:  66 PR Interval:  150 QRS Duration: 148 QT Interval:  438 QTC Calculation: 459 R Axis:   -74 Text Interpretation:   Poor data quality, interpretation may be adversely affected Normal sinus rhythm with sinus arrhythmia Right bundle branch block Left anterior fascicular block * Bifascicular block * Abnormal ECG No significant change since last tracing Confirmed by  Canary Brim  MD, [place] [id number]) on 09/27/2016 8:52:36 AM      Radiology Dg Chest 2 View  Result Date: 09/27/2016 CLINICAL DATA:  Chest pain EXAM: CHEST  2 VIEW COMPARISON:  August 23, 2015 FINDINGS: There is mild atelectasis in the left base. There is no edema or consolidation. The heart size pulmonary vascularity are normal. No adenopathy. There is atherosclerotic calcification in the aorta. There is degenerative change in portions of the thoracic spine. IMPRESSION: Mild left base atelectasis. No edema or consolidation. Stable cardiac silhouette. Aortic atherosclerosis Electronically Signed   By: Lowella Grip III M.D.   On: 09/27/2016 08:18    Procedures Procedures (including critical care time)  Medications Ordered in ED Medications  aspirin chewable tablet 324 mg (324 mg Oral Given 09/27/16 0935)     Initial Impression / Assessment and Plan / ED Course  I have reviewed the triage vital signs and the nursing notes.  Pertinent labs & imaging results that were available during my care of the patient were reviewed by me and considered in my medical decision making (see chart for details).  Clinical Course   9:29 AM d/w cardiology- they will see patient in the ED.    1:56 PM pt is eager for discharge.  I have paged cardmaster and now awaiting callback from cards PA who saw patient to be  sure of plan for discharge and that cards attending agrees.   2:37 PM have not received call back from either PA pager or cardiology attending pager.  Pt is very anxious to be discharged.  Based on PA note, I will proceed with discharge.  Will start trial of H2 blocker as well.  Symptoms do clinically seem most c/w GERD.    Pt presenting with chest pain- improved after belching in the setting of recently stopping prilosec, likely more related to GERD. Consulted with cardiology- per their note they advise adding H2 blocker- second troponin negative.  They will follwoup with patient as an outpatient.  Discharged with strict return precautions.  Pt agreeable with plan.  Final Clinical Impressions(s) / ED Diagnoses   Final diagnoses:  Chest pain, unspecified type  Gastroesophageal reflux disease, esophagitis presence not specified    New Prescriptions Discharge Medication List as of 09/27/2016  2:40 PM    START taking these medications   Details  ranitidine (ZANTAC) 150 MG tablet Take 1 tablet (150 mg total) by mouth 2 (two) times daily., Starting Fri 09/27/2016, Print         Alfonzo Beers, MD 09/27/16 [phone]

## 2016-09-27 NOTE — Discharge Instructions (Signed)
Return to the ED with any concerns including difficulty breathing, chest pain that is associated with sweating/difficulty breathing, fainting, decreased level of alertness/lethargy, or any other alarming symptoms

## 2016-10-02 DIAGNOSIS — Z85828 Personal history of other malignant neoplasm of skin: Secondary | ICD-10-CM | POA: Diagnosis not present

## 2016-10-02 DIAGNOSIS — D692 Other nonthrombocytopenic purpura: Secondary | ICD-10-CM | POA: Diagnosis not present

## 2016-10-02 DIAGNOSIS — D485 Neoplasm of uncertain behavior of skin: Secondary | ICD-10-CM | POA: Diagnosis not present

## 2016-10-02 DIAGNOSIS — C4441 Basal cell carcinoma of skin of scalp and neck: Secondary | ICD-10-CM | POA: Diagnosis not present

## 2016-10-02 DIAGNOSIS — L570 Actinic keratosis: Secondary | ICD-10-CM | POA: Diagnosis not present

## 2016-10-02 DIAGNOSIS — L821 Other seborrheic keratosis: Secondary | ICD-10-CM | POA: Diagnosis not present

## 2016-10-14 ENCOUNTER — Telehealth (HOSPITAL_COMMUNITY): Payer: Self-pay | Admitting: Radiology

## 2016-10-14 ENCOUNTER — Telehealth (HOSPITAL_COMMUNITY): Payer: Self-pay | Admitting: *Deleted

## 2016-10-14 NOTE — Telephone Encounter (Signed)
Patient given detailed instructions per Myocardial Perfusion Study Information Sheet for the test on 10/16/2016 at 9:45. Patient notified to arrive 15 minutes early and that it is imperative to arrive on time for appointment to keep from having the test rescheduled.  If you need to cancel or reschedule your appointment, please call the office within 24 hours of your appointment. Failure to do so may result in a cancellation of your appointment, and a $50 no show fee. Patient verbalized understanding.EHK

## 2016-10-14 NOTE — Telephone Encounter (Signed)
Left message on voicemail in reference to upcoming appointment scheduled for 10/16/16. Phone number given for a call back so details instructions can be given. [name], [name]

## 2016-10-15 ENCOUNTER — Telehealth (HOSPITAL_COMMUNITY): Payer: Self-pay | Admitting: *Deleted

## 2016-10-15 NOTE — Telephone Encounter (Signed)
Patient given detailed instructions per Myocardial Perfusion Study Information Sheet for the test on 10/16/16 at 0945. Patient notified to arrive 15 minutes early and that it is imperative to arrive on time for appointment to keep from having the test rescheduled.  If you need to cancel or reschedule your appointment, please call the office within 24 hours of your appointment. Failure to do so may result in a cancellation of your appointment, and a $50 no show fee. Patient verbalized understanding.Tashawn Laswell, [name]

## 2016-10-16 ENCOUNTER — Ambulatory Visit (HOSPITAL_COMMUNITY): Payer: Medicare Other | Attending: Internal Medicine

## 2016-10-16 DIAGNOSIS — R079 Chest pain, unspecified: Secondary | ICD-10-CM | POA: Diagnosis not present

## 2016-10-16 LAB — MYOCARDIAL PERFUSION IMAGING
CHL CUP NUCLEAR SDS: 0
CHL CUP NUCLEAR SRS: 13
CHL CUP NUCLEAR SSS: 13
CSEPPHR: 85 {beats}/min
LV dias vol: 96 mL (ref 62–150)
LV sys vol: 50 mL
RATE: 0.45
Rest HR: 60 {beats}/min
TID: 0.93

## 2016-10-16 MED ORDER — REGADENOSON 0.4 MG/5ML IV SOLN
0.4000 mg | Freq: Once | INTRAVENOUS | Status: AC
  Administered 2016-10-16: 0.4 mg via INTRAVENOUS

## 2016-10-16 MED ORDER — TECHNETIUM TC 99M TETROFOSMIN IV KIT
31.5000 | PACK | Freq: Once | INTRAVENOUS | Status: AC | PRN
  Administered 2016-10-16: 31.5 via INTRAVENOUS
  Filled 2016-10-16: qty 32

## 2016-10-16 MED ORDER — TECHNETIUM TC 99M TETROFOSMIN IV KIT
9.8000 | PACK | Freq: Once | INTRAVENOUS | Status: AC | PRN
  Administered 2016-10-16: 9.8 via INTRAVENOUS
  Filled 2016-10-16: qty 10

## 2016-10-17 DIAGNOSIS — L988 Other specified disorders of the skin and subcutaneous tissue: Secondary | ICD-10-CM | POA: Diagnosis not present

## 2016-10-17 DIAGNOSIS — C4441 Basal cell carcinoma of skin of scalp and neck: Secondary | ICD-10-CM | POA: Diagnosis not present

## 2016-10-17 DIAGNOSIS — Z85828 Personal history of other malignant neoplasm of skin: Secondary | ICD-10-CM | POA: Diagnosis not present

## 2016-10-20 ENCOUNTER — Other Ambulatory Visit: Payer: Self-pay | Admitting: Internal Medicine

## 2016-11-11 ENCOUNTER — Ambulatory Visit (INDEPENDENT_AMBULATORY_CARE_PROVIDER_SITE_OTHER): Payer: Medicare Other | Admitting: Family Medicine

## 2016-11-11 VITALS — BP 122/70 | HR 100 | Temp 97.9°F | Ht 72.0 in | Wt 201.8 lb

## 2016-11-11 DIAGNOSIS — K648 Other hemorrhoids: Secondary | ICD-10-CM

## 2016-11-11 DIAGNOSIS — K921 Melena: Secondary | ICD-10-CM

## 2016-11-11 LAB — CBC WITH DIFFERENTIAL/PLATELET
BASOS PCT: 0.8 % (ref 0.0–3.0)
Basophils Absolute: 0.1 10*3/uL (ref 0.0–0.1)
EOS PCT: 1.8 % (ref 0.0–5.0)
Eosinophils Absolute: 0.1 10*3/uL (ref 0.0–0.7)
HEMATOCRIT: 45 % (ref 39.0–52.0)
HEMOGLOBIN: 15.2 g/dL (ref 13.0–17.0)
LYMPHS PCT: 38.5 % (ref 12.0–46.0)
Lymphs Abs: 3.1 10*3/uL (ref 0.7–4.0)
MCHC: 33.6 g/dL (ref 30.0–36.0)
MCV: 96.6 fl (ref 78.0–100.0)
MONO ABS: 0.8 10*3/uL (ref 0.1–1.0)
Monocytes Relative: 10.1 % (ref 3.0–12.0)
Neutro Abs: 3.9 10*3/uL (ref 1.4–7.7)
Neutrophils Relative %: 48.8 % (ref 43.0–77.0)
Platelets: 247 10*3/uL (ref 150.0–400.0)
RBC: 4.66 Mil/uL (ref 4.22–5.81)
RDW: 14.4 % (ref 11.5–15.5)
WBC: 8 10*3/uL (ref 4.0–10.5)

## 2016-11-11 NOTE — Patient Instructions (Signed)
Hemorrhoids Hemorrhoids are swollen veins in and around the rectum or anus. There are two types of hemorrhoids:  Internal hemorrhoids. These occur in the veins that are just inside the rectum. They may poke through to the outside and become irritated and painful.  External hemorrhoids. These occur in the veins that are outside of the anus and can be felt as a painful swelling or hard lump near the anus.  Most hemorrhoids do not cause serious problems, and they can be managed with home treatments such as diet and lifestyle changes. If home treatments do not help your symptoms, procedures can be done to shrink or remove the hemorrhoids. What are the causes? This condition is caused by increased pressure in the anal area. This pressure may result from various things, including:  Constipation.  Straining to have a bowel movement.  Diarrhea.  Pregnancy.  Obesity.  Sitting for long periods of time.  Heavy lifting or other activity that causes you to strain.  Anal sex.  What are the signs or symptoms? Symptoms of this condition include:  Pain.  Anal itching or irritation.  Rectal bleeding.  Leakage of stool (feces).  Anal swelling.  One or more lumps around the anus.  How is this diagnosed? This condition can often be diagnosed through a visual exam. Other exams or tests may also be done, such as:  Examination of the rectal area with a gloved hand (digital rectal exam).  Examination of the anal canal using a small tube (anoscope).  A blood test, if you have lost a significant amount of blood.  A test to look inside the colon (sigmoidoscopy or colonoscopy).  How is this treated? This condition can usually be treated at home. However, various procedures may be done if dietary changes, lifestyle changes, and other home treatments do not help your symptoms. These procedures can help make the hemorrhoids smaller or remove them completely. Some of these procedures involve  surgery, and others do not. Common procedures include:  Rubber band ligation. Rubber bands are placed at the base of the hemorrhoids to cut off the blood supply to them.  Sclerotherapy. Medicine is injected into the hemorrhoids to shrink them.  Infrared coagulation. A type of light energy is used to get rid of the hemorrhoids.  Hemorrhoidectomy surgery. The hemorrhoids are surgically removed, and the veins that supply them are tied off.  Stapled hemorrhoidopexy surgery. A circular stapling device is used to remove the hemorrhoids and use staples to cut off the blood supply to them.  Follow these instructions at home: Eating and drinking  Eat foods that have a lot of fiber in them, such as whole grains, beans, nuts, fruits, and vegetables. Ask your health care provider about taking products that have added fiber (fiber supplements).  Drink enough fluid to keep your urine clear or pale yellow. Managing pain and swelling  Take warm sitz baths for 20 minutes, 3-4 times a day to ease pain and discomfort.  If directed, apply ice to the affected area. Using ice packs between sitz baths may be helpful. ? Put ice in a plastic bag. ? Place a towel between your skin and the bag. ? Leave the ice on for 20 minutes, 2-3 times a day. General instructions  Take over-the-counter and prescription medicines only as told by your health care provider.  Use medicated creams or suppositories as told.  Exercise regularly.  Go to the bathroom when you have the urge to have a bowel movement. Do not wait.    Avoid straining to have bowel movements.  Keep the anal area dry and clean. Use wet toilet paper or moist towelettes after a bowel movement.  Do not sit on the toilet for long periods of time. This increases blood pooling and pain. Contact a health care provider if:  You have increasing pain and swelling that are not controlled by treatment or medicine.  You have uncontrolled bleeding.  You  have difficulty having a bowel movement, or you are unable to have a bowel movement.  You have pain or inflammation outside the area of the hemorrhoids. This information is not intended to replace advice given to you by your health care provider. Make sure you discuss any questions you have with your health care provider. Document Released: 10/11/2000 Document Revised: 03/13/2016 Document Reviewed: 06/28/2015 Elsevier Interactive Patient Education  2017 Elsevier Inc.  

## 2016-11-11 NOTE — Progress Notes (Signed)
Pre visit review using our clinic review tool, if applicable. No additional management support is needed unless otherwise documented below in the visit note. 

## 2016-11-11 NOTE — Progress Notes (Signed)
Subjective:     Patient ID: Jacob Bowens., male   DOB: 02/17/1933, 83 y.o.   MRN: [id number]  HPI Patient seen with complaint of almost 2 month history of some intermittent bright blood with wiping. He states he has never had any mixed with stool. He has occasional perianal pain and frequent constipation. He had CBC 09/27/2016 with hemoglobin 15.3. Symptoms are very intermittent. He is not aware of prior history of internal hemorrhoids or anal fissure. He had colonoscopy 2008 which showed one small polyp. No studies since then. Denies any recent appetite or weight changes.  No abdominal pain.  No large quantity- only small amounts with wiping.  Past Medical History:  Diagnosis Date  . Coronary artery disease    a. stent (promus) Cx/OM'09  . Fibromyalgia dx'd ~ 04/2015  . GERD (gastroesophageal reflux disease)   . Gout   . HEARING LOSS    "temporary"  . Hyperlipidemia   . HYPERLIPIDEMIA 10/13/2007  . Hypertension   . HYPERTENSION 10/13/2007  . Osteoarthritis    "left shoulder" (08/23/2015)  . Prostatitis, acute   . PROSTATITIS, ACUTE, HX OF 10/13/2007  . PSORIASIS 10/13/2007  . PVC (premature ventricular contraction)    hx  . SKIN CANCER, RECURRENT 10/13/2007   Past Surgical History:  Procedure Laterality Date  . APPENDECTOMY    . CORONARY ANGIOPLASTY WITH STENT PLACEMENT    . CYST EXCISION Right X 2   shoulder  . ELECTROLYSIS OF MISDIRECTED LASHES Bilateral    eyelashes are misdirected and grow inwards   . EYE SURGERY    . MASS EXCISION Right 01/2005   proximal thigh soft tissue mass  . TEAR DUCT PROBING Left   . TYMPANOPLASTY Bilateral    "for hearing loss; put tubes in also, 2X on right, 1X on the left; tubes worked"  . VASECTOMY      reports that he has never smoked. He has never used smokeless tobacco. He reports that he does not drink alcohol or use drugs. family history includes Breast cancer in his sister; Heart attack (age of onset: 31) in his father;  Parkinsonism (age of onset: 68) in his mother. Allergies  Allergen Reactions  . Niacin Hives and Rash     Review of Systems  Constitutional: Negative for appetite change, chills, fever and unexpected weight change.  Respiratory: Negative for shortness of breath.   Cardiovascular: Negative for chest pain.  Gastrointestinal: Positive for anal bleeding. Negative for abdominal pain, nausea and vomiting.  Neurological: Negative for dizziness and syncope.       Objective:   Physical Exam  Constitutional: He appears well-developed and well-nourished.  Cardiovascular: Normal rate.   Pulmonary/Chest: Effort normal and breath sounds normal. No respiratory distress. He has no wheezes. He has no rales.  Genitourinary:  Genitourinary Comments: No visible external hemorrhoids. No anal fissures noted. Digital exam reveals no mass. Anoscopy performed. He does have internal hemorrhoids but no active bleeding. No other acute abnormalities noted.       Assessment:     Intermittent hematochezia.  No anal fissure.  He does have some internal hemorrhoids by anoscopy but no active bleeding.  No masses seen.  He is on chronic aspirin which is an obvious risk factor    Plan:     -Discussed measures to reduce constipation -Consider daily stool softener -Recheck CBC -Consider GI referral if symptoms persist -We discussed issues regarding repeat colonoscopy at his age and this point he is undecided  Alinda Sierras  Burchette MD [facility] at [facility]

## 2016-11-15 ENCOUNTER — Encounter: Payer: Self-pay | Admitting: Gastroenterology

## 2016-11-26 ENCOUNTER — Telehealth: Payer: Self-pay | Admitting: Family Medicine

## 2016-12-02 DIAGNOSIS — H66002 Acute suppurative otitis media without spontaneous rupture of ear drum, left ear: Secondary | ICD-10-CM | POA: Diagnosis not present

## 2016-12-03 ENCOUNTER — Ambulatory Visit (INDEPENDENT_AMBULATORY_CARE_PROVIDER_SITE_OTHER): Payer: Medicare Other | Admitting: Family Medicine

## 2016-12-03 ENCOUNTER — Encounter: Payer: Self-pay | Admitting: Family Medicine

## 2016-12-03 ENCOUNTER — Other Ambulatory Visit: Payer: Self-pay

## 2016-12-03 VITALS — BP 100/70 | HR 93 | Temp 97.8°F | Ht 72.0 in | Wt 199.0 lb

## 2016-12-03 DIAGNOSIS — K921 Melena: Secondary | ICD-10-CM | POA: Diagnosis not present

## 2016-12-03 LAB — CBC WITH DIFFERENTIAL/PLATELET
Basophils Absolute: 0.1 10*3/uL (ref 0.0–0.1)
Basophils Relative: 0.8 % (ref 0.0–3.0)
Eosinophils Absolute: 0.1 10*3/uL (ref 0.0–0.7)
Eosinophils Relative: 2.1 % (ref 0.0–5.0)
HCT: 43.9 % (ref 39.0–52.0)
Hemoglobin: 14.6 g/dL (ref 13.0–17.0)
Lymphocytes Relative: 35.8 % (ref 12.0–46.0)
Lymphs Abs: 2.3 10*3/uL (ref 0.7–4.0)
MCHC: 33.3 g/dL (ref 30.0–36.0)
MCV: 96.1 fl (ref 78.0–100.0)
Monocytes Absolute: 0.7 10*3/uL (ref 0.1–1.0)
Monocytes Relative: 10.5 % (ref 3.0–12.0)
Neutro Abs: 3.3 10*3/uL (ref 1.4–7.7)
Neutrophils Relative %: 50.8 % (ref 43.0–77.0)
Platelets: 264 10*3/uL (ref 150.0–400.0)
RBC: 4.57 Mil/uL (ref 4.22–5.81)
RDW: 14.1 % (ref 11.5–15.5)
WBC: 6.5 10*3/uL (ref 4.0–10.5)

## 2016-12-03 MED ORDER — HYDROCORTISONE ACETATE 25 MG RE SUPP: 25.0000 mg | Freq: Two times a day (BID) | RECTAL | 0 refills | Status: DC

## 2016-12-03 NOTE — Progress Notes (Signed)
Subjective:     Patient ID: [name]., male   DOB: 1933/03/20, 83 y.o.   MRN: [id number]  HPI Patient seen for follow-up with some persistent hematochezia. He has GI appointment already scheduled for February 27 with Dr. Ardis Hughs. Refer to recent note from 11/11/16. He related to month history of some intermittent bright blood with wiping. No significant perianal pain. He has history of colonoscopy 2008 with one small polyp. No recent appetite or weight changes. Recent CBC see stable with hemoglobin over 15.  We performed an anoscopy last visit and he had some internal hemorrhoids but we did not see active bleeding. No visible anal fissure at that time. No abdominal pain. He's had somewhat more frequent stools since then but denies any diarrhea. He has bright blood but never heavy. He has never had any associated abdominal pain or cramping.  Past Medical History:  Diagnosis Date  . Coronary artery disease    a. stent (promus) Cx/OM'09  . Fibromyalgia dx'd ~ 04/2015  . GERD (gastroesophageal reflux disease)   . Gout   . HEARING LOSS    "temporary"  . Hyperlipidemia   . HYPERLIPIDEMIA 10/13/2007  . Hypertension   . HYPERTENSION 10/13/2007  . Osteoarthritis    "left shoulder" (08/23/2015)  . Prostatitis, acute   . PROSTATITIS, ACUTE, HX OF 10/13/2007  . PSORIASIS 10/13/2007  . PVC (premature ventricular contraction)    hx  . SKIN CANCER, RECURRENT 10/13/2007   Past Surgical History:  Procedure Laterality Date  . APPENDECTOMY    . CORONARY ANGIOPLASTY WITH STENT PLACEMENT    . CYST EXCISION Right X 2   shoulder  . ELECTROLYSIS OF MISDIRECTED LASHES Bilateral    eyelashes are misdirected and grow inwards   . EYE SURGERY    . MASS EXCISION Right 01/2005   proximal thigh soft tissue mass  . TEAR DUCT PROBING Left   . TYMPANOPLASTY Bilateral    "for hearing loss; put tubes in also, 2X on right, 1X on the left; tubes worked"  . VASECTOMY      reports that he has never  smoked. He has never used smokeless tobacco. He reports that he does not drink alcohol or use drugs. family history includes Breast cancer in his sister; Heart attack (age of onset: 93) in his father; Parkinsonism (age of onset: 35) in his mother. Allergies  Allergen Reactions  . Niacin Hives and Rash     Review of Systems  Constitutional: Negative for appetite change, chills, fever and unexpected weight change.  Respiratory: Negative for shortness of breath.   Cardiovascular: Negative for chest pain.  Gastrointestinal: Positive for blood in stool. Negative for abdominal pain, nausea, rectal pain and vomiting.  Neurological: Negative for dizziness.       Objective:   Physical Exam  Constitutional: He appears well-developed and well-nourished.  Cardiovascular: Normal rate and regular rhythm.   Pulmonary/Chest: Effort normal and breath sounds normal. No respiratory distress. He has no wheezes. He has no rales.  Abdominal: Soft. He exhibits no distension and no mass. There is no tenderness. There is no rebound and no guarding.       Assessment:     Persistent hematochezia. Suspect likely related to internal hemorrhoids. We did note intermittent hemorrhoids on recent anoscopy but they were not actively bleeding at the time. He does not have any abdominal pain or other suggestion of ischemic colitis and no hx of diverticulosis.  No recent anal fissure seen.    Plan:     -  Continue with GI appointment as scheduled -Repeat CBC today to make sure hemoglobin stable -Consider over-the-counter Anusol HC suppositories 1 twice daily -Recommend high-fiber diet to avoid straining -Follow-up immediately for any dizziness or increased bleeding -We did not repeat anoscopy today as he had this last visit  Eulas Post MD [facility] at [facility]

## 2016-12-03 NOTE — Patient Instructions (Addendum)
Consider over the counter Anusol HC suppository one twice daily Drink plenty of fluids and fiber  High-Fiber Diet Fiber, also called dietary fiber, is a type of carbohydrate found in fruits, vegetables, whole grains, and beans. A high-fiber diet can have many health benefits. Your health care provider may recommend a high-fiber diet to help:  Prevent constipation. Fiber can make your bowel movements more regular.  Lower your cholesterol.  Relieve hemorrhoids, uncomplicated diverticulosis, or irritable bowel syndrome.  Prevent overeating as part of a weight-loss plan.  Prevent heart disease, type 2 diabetes, and certain cancers. What is my plan? The recommended daily intake of fiber includes:  38 grams for men under age 47.  55 grams for men over age 63.  41 grams for women under age 66.  44 grams for women over age 62. You can get the recommended daily intake of dietary fiber by eating a variety of fruits, vegetables, grains, and beans. Your health care provider may also recommend a fiber supplement if it is not possible to get enough fiber through your diet. What do I need to know about a high-fiber diet?  Fiber supplements have not been widely studied for their effectiveness, so it is better to get fiber through food sources.  Always check the fiber content on thenutrition facts label of any prepackaged food. Look for foods that contain at least 5 grams of fiber per serving.  Ask your dietitian if you have questions about specific foods that are related to your condition, especially if those foods are not listed in the following section.  Increase your daily fiber consumption gradually. Increasing your intake of dietary fiber too quickly may cause bloating, cramping, or gas.  Drink plenty of water. Water helps you to digest fiber. What foods can I eat? Grains  Whole-grain breads. Multigrain cereal. Oats and oatmeal. Brown rice. Barley. Bulgur wheat. [place]. Bran muffins.  Popcorn. Rye wafer crackers. Vegetables  Sweet potatoes. Spinach. Kale. Artichokes. Cabbage. Broccoli. Green peas. Carrots. Squash. Fruits  Berries. Pears. Apples. Oranges. Avocados. Prunes and raisins. Dried figs. Meats and Other Protein Sources  [place], kidney, pinto, and soy beans. Split peas. Lentils. Nuts and seeds. Dairy  Fiber-fortified yogurt. Beverages  Fiber-fortified soy milk. Fiber-fortified orange juice. Other  Fiber bars. The items listed above may not be a complete list of recommended foods or beverages. Contact your dietitian for more options.  What foods are not recommended? Grains  White bread. Pasta made with refined flour. White rice. Vegetables  Fried potatoes. Canned vegetables. Well-cooked vegetables. Fruits  Fruit juice. Cooked, strained fruit. Meats and Other Protein Sources  Fatty cuts of meat. Fried Sales executive or fried fish. Dairy  Milk. Yogurt. Cream cheese. Sour cream. Beverages  Soft drinks. Other  Cakes and pastries. Butter and oils. The items listed above may not be a complete list of foods and beverages to avoid. Contact your dietitian for more information.  What are some tips for including high-fiber foods in my diet?  Eat a wide variety of high-fiber foods.  Make sure that half of all grains consumed each day are whole grains.  Replace breads and cereals made from refined flour or white flour with whole-grain breads and cereals.  Replace white rice with brown rice, bulgur wheat, or millet.  Start the day with a breakfast that is high in fiber, such as a cereal that contains at least 5 grams of fiber per serving.  Use beans in place of meat in soups, salads, or pasta.  Eat  high-fiber snacks, such as berries, raw vegetables, nuts, or popcorn. This information is not intended to replace advice given to you by your health care provider. Make sure you discuss any questions you have with your health care provider. Document Released: 10/14/2005  Document Revised: 03/21/2016 Document Reviewed: 03/29/2014 Elsevier Interactive Patient Education  2017 [employer].

## 2016-12-03 NOTE — Progress Notes (Signed)
Pre visit review using our clinic review tool, if applicable. No additional management support is needed unless otherwise documented below in the visit note. 

## 2016-12-24 ENCOUNTER — Ambulatory Visit (INDEPENDENT_AMBULATORY_CARE_PROVIDER_SITE_OTHER): Payer: Medicare Other | Admitting: Gastroenterology

## 2016-12-24 ENCOUNTER — Encounter: Payer: Self-pay | Admitting: Gastroenterology

## 2016-12-24 VITALS — BP 130/70 | HR 72 | Ht 72.0 in | Wt 195.6 lb

## 2016-12-24 DIAGNOSIS — K625 Hemorrhage of anus and rectum: Secondary | ICD-10-CM

## 2016-12-24 MED ORDER — NA SULFATE-K SULFATE-MG SULF 17.5-3.13-1.6 GM/177ML PO SOLN: 1.0000 | Freq: Once | ORAL | 0 refills | Status: AC

## 2016-12-24 NOTE — Progress Notes (Signed)
Review of pertinent gastrointestinal problems: 1. Adenomatous colon polyp: 02/2007 colonoscopy Dr. Ardis Hughs for screening found single subCM polyp, adenoma on path.  I recommended surveillance colonoscopy at 5 year interval, letter was sent in 2013.  Another letter was sent 2014.   HPI: This is a  very pleasant 83 year old man  who was referred to me by Jacob Post, MD  to evaluate  intermittent rectal bleeding .    Chief complaint is intermittent rectal bleeding, weight loss  Intermittent red rectal bleeding,  2-3 times per week.  Bright red.  Only on TP.  Can drip into the toilette.  Disomfort anally at times.    Has lost 20 pounds in 6 months, unintentionally but he did quit eating at night.    No diarrhea or constipation.  No colon cancer in his family.  Old Data Reviewed:  Cbc 11/2016 normal. Cardiac myoview 09/2016: ER 48 %, "low risk study"    Review of systems: Pertinent positive and negative review of systems were noted in the above HPI section. Complete review of systems was performed and was otherwise normal.   Past Medical History:  Diagnosis Date  . Coronary artery disease    a. stent (promus) Cx/OM'09  . Fibromyalgia dx'd ~ 04/2015  . GERD (gastroesophageal reflux disease)   . Gout   . HEARING LOSS    "temporary"  . Hyperlipidemia   . HYPERLIPIDEMIA 10/13/2007  . Hypertension   . HYPERTENSION 10/13/2007  . Osteoarthritis    "left shoulder" (08/23/2015)  . Prostatitis, acute   . PROSTATITIS, ACUTE, HX OF 10/13/2007  . PSORIASIS 10/13/2007  . PVC (premature ventricular contraction)    hx  . SKIN CANCER, RECURRENT 10/13/2007    Past Surgical History:  Procedure Laterality Date  . APPENDECTOMY    . CORONARY ANGIOPLASTY WITH STENT PLACEMENT    . CYST EXCISION Right X 2   shoulder  . ELECTROLYSIS OF MISDIRECTED LASHES Bilateral    eyelashes are misdirected and grow inwards   . EYE SURGERY    . MASS EXCISION Right 01/2005   proximal thigh soft  tissue mass  . TEAR DUCT PROBING Left   . TYMPANOPLASTY Bilateral    "for hearing loss; put tubes in also, 2X on right, 1X on the left; tubes worked"  . VASECTOMY      Current Outpatient Prescriptions  Medication Sig Dispense Refill  . aspirin 81 MG tablet Take 81 mg by mouth daily.      [name] atenolol (TENORMIN) 25 MG tablet TAKE ONE-HALF TABLET BY MOUTH ONCE DAILY. 45 tablet 3  . lisinopril (PRINIVIL,ZESTRIL) 5 MG tablet Take 1 tablet (5 mg total) by mouth daily. 90 tablet 3  . nitroGLYCERIN (NITROSTAT) 0.4 MG SL tablet DISSOLVE ONE TABLET UNDER THE TONGUE EVERY 5 MINUTES AS NEEDED (Patient taking differently: Place 0.4 mg under the tongue every 5 (five) minutes as needed for chest pain. ) 25 tablet 12  . omeprazole (PRILOSEC OTC) 20 MG tablet Take 20 mg by mouth daily.    . rosuvastatin (CRESTOR) 10 MG tablet Take one tablet by mouth daily prior to bedtime (Patient taking differently: Take 10 mg by mouth at bedtime. ) 90 tablet 3  . triamterene-hydrochlorothiazide (MAXZIDE-25) 37.5-25 MG tablet Take 1 tablet by mouth daily.     No current facility-administered medications for this visit.     Allergies as of 12/24/2016 - Review Complete 12/24/2016  Allergen Reaction Noted  . Niacin Hives and Rash     Family History  Problem Relation Age of Onset  . Heart attack Father 32    died  . Parkinsonism Mother 20    died  . Breast cancer Sister     died  . Heart attack  50    paternal uncle died/maternal aunt died  . Lung cancer      uncle died    Social History   Social History  . Marital status: Married    Spouse name: N/A  . Number of children: 3  . Years of education: N/A   Occupational History  . retired Retired    from [employer].   Social History Main Topics  . Smoking status: Never Smoker  . Smokeless tobacco: Never Used  . Alcohol use No  . Drug use: No  . Sexual activity: No   Other Topics Concern  . Not on file   Social History Narrative   Married 1958   2  daughters- '59, 68, 1 son '61   8 grandchildren   Retired from [employer], works PT as a Curator. Now retired completely (09/2008)   End of life; does not want heroic measures if in a persistent vegative state     Physical Exam: BP 130/70   Pulse 72   Ht 6' (1.829 m)   Wt 195 lb 9.6 oz (88.7 kg)   BMI 26.53 kg/m  Constitutional: generally well-appearing Psychiatric: alert and oriented x3 Eyes: extraocular movements intact Mouth: oral pharynx moist, no lesions Neck: supple no lymphadenopathy Cardiovascular: heart regular rate and rhythm Lungs: clear to auscultation bilaterally Abdomen: soft, nontender, nondistended, no obvious ascites, no peritoneal signs, normal bowel sounds Extremities: no lower extremity edema bilaterally Skin: no lesions on visible extremities Rectal exam: Small deflated hemorrhoidal external tissue noted, no internal anal masses, stool was brown and not checked for Hemoccult.  Assessment and plan: 83 y.o. male with  intermittent rectal bleeding, weight loss over the past 6 months, personal history of adenomatous colon polyp  I recommended we proceed with colonoscopy at his soonest convenience to exclude significant neoplasia as a cause for his symptoms. If all I find is minor hemorrhoidal disease then I will likely arrange for him to meet with one of my partners who does in office hemorrhoidal banding. I see no reason for any further blood tests or imaging studies prior to then.    Please see the "Patient Instructions" section for addition details about the plan.   Jacob Loffler, MD [facility] 12/24/2016, 10:13 AM  Cc: Jacob Post, MD

## 2016-12-24 NOTE — Patient Instructions (Signed)
You will be set up for a colonoscopy for rectal bleeding.

## 2016-12-30 ENCOUNTER — Other Ambulatory Visit (HOSPITAL_COMMUNITY)
Admission: RE | Admit: 2016-12-30 | Discharge: 2016-12-30 | Disposition: A | Discharge status: Home / Self Care | Payer: Medicare Other | Source: Ambulatory Visit | Attending: Oncology | Admitting: Oncology

## 2016-12-30 ENCOUNTER — Ambulatory Visit (AMBULATORY_SURGERY_CENTER): Payer: Medicare Other | Admitting: Gastroenterology

## 2016-12-30 ENCOUNTER — Encounter: Payer: Self-pay | Admitting: Gastroenterology

## 2016-12-30 ENCOUNTER — Other Ambulatory Visit (HOSPITAL_COMMUNITY)
Admission: RE | Admit: 2016-12-30 | Discharge: 2016-12-30 | Disposition: A | Discharge status: Home / Self Care | Payer: Medicare Other | Source: Ambulatory Visit | Attending: Gastroenterology | Admitting: Gastroenterology

## 2016-12-30 VITALS — BP 107/59 | HR 63 | Temp 98.0°F | Resp 17 | Ht 72.0 in | Wt 195.0 lb

## 2016-12-30 DIAGNOSIS — I252 Old myocardial infarction: Secondary | ICD-10-CM | POA: Diagnosis not present

## 2016-12-30 DIAGNOSIS — D122 Benign neoplasm of ascending colon: Secondary | ICD-10-CM

## 2016-12-30 DIAGNOSIS — K639 Disease of intestine, unspecified: Secondary | ICD-10-CM | POA: Diagnosis not present

## 2016-12-30 DIAGNOSIS — K625 Hemorrhage of anus and rectum: Secondary | ICD-10-CM | POA: Diagnosis not present

## 2016-12-30 DIAGNOSIS — I10 Essential (primary) hypertension: Secondary | ICD-10-CM | POA: Diagnosis not present

## 2016-12-30 DIAGNOSIS — I2510 Atherosclerotic heart disease of native coronary artery without angina pectoris: Secondary | ICD-10-CM | POA: Diagnosis not present

## 2016-12-30 DIAGNOSIS — K529 Noninfective gastroenteritis and colitis, unspecified: Secondary | ICD-10-CM | POA: Diagnosis present

## 2016-12-30 DIAGNOSIS — K219 Gastro-esophageal reflux disease without esophagitis: Secondary | ICD-10-CM | POA: Diagnosis not present

## 2016-12-30 MED ORDER — MESALAMINE 1000 MG RE SUPP: 1000.0000 mg | Freq: Every day | RECTAL | 6 refills | Status: DC

## 2016-12-30 MED ORDER — SODIUM CHLORIDE 0.9 % IV SOLN: 500.0000 mL | INTRAVENOUS | Status: DC

## 2016-12-30 NOTE — Patient Instructions (Signed)
YOU HAD AN ENDOSCOPIC PROCEDURE TODAY AT [facility]:   Refer to the procedure report that was given to you for any specific questions about what was found during the examination.  If the procedure report does not answer your questions, please call your gastroenterologist to clarify.  If you requested that your care partner not be given the details of your procedure findings, then the procedure report has been included in a sealed envelope for you to review at your convenience later.  YOU SHOULD EXPECT: Some feelings of bloating in the abdomen. Passage of more gas than usual.  Walking can help get rid of the air that was put into your GI tract during the procedure and reduce the bloating. If you had a lower endoscopy (such as a colonoscopy or flexible sigmoidoscopy) you may notice spotting of blood in your stool or on the toilet paper. If you underwent a bowel prep for your procedure, you may not have a normal bowel movement for a few days.  Please Note:  You might notice some irritation and congestion in your nose or some drainage.  This is from the oxygen used during your procedure.  There is no need for concern and it should clear up in a day or so.  SYMPTOMS TO REPORT IMMEDIATELY:   Following lower endoscopy (colonoscopy or flexible sigmoidoscopy):  Excessive amounts of blood in the stool  Significant tenderness or worsening of abdominal pains  Swelling of the abdomen that is new, acute  Fever of 100F or higher   For urgent or emergent issues, a gastroenterologist can be reached at any hour by calling [phone].   DIET:  We do recommend a small meal at first, but then you may proceed to your regular diet.  Drink plenty of fluids but you should avoid alcoholic beverages for 24 hours.  ACTIVITY:  You should plan to take it easy for the rest of today and you should NOT DRIVE or use heavy machinery until tomorrow (because of the sedation medicines used during the test).     FOLLOW UP: Our staff will call the number listed on your records the next business day following your procedure to check on you and address any questions or concerns that you may have regarding the information given to you following your procedure. If we do not reach you, we will leave a message.  However, if you are feeling well and you are not experiencing any problems, there is no need to return our call.  We will assume that you have returned to your regular daily activities without incident.  If any biopsies were taken you will be contacted by phone or by letter within the next 1-3 weeks.  Please call us at [phone] if you have not heard about the biopsies in 3 weeks.   Await biopsy to determined next repeat Colonoscopy Polyps (handout given) Please start new prescription suppository Canasa 1 gram insert one nightly .   SIGNATURES/CONFIDENTIALITY: You and/or your care partner have signed paperwork which will be entered into your electronic medical record.  These signatures attest to the fact that that the information above on your After Visit Summary has been reviewed and is understood.  Full responsibility of the confidentiality of this discharge information lies with you and/or your care-partner.

## 2016-12-30 NOTE — Progress Notes (Signed)
Called to room to assist during endoscopic procedure.  Patient ID and intended procedure confirmed with present staff. Received instructions for my participation in the procedure from the performing physician.  

## 2016-12-30 NOTE — Op Note (Signed)
[facility] Patient Name: [name] Procedure Date: 12/30/2016 10:47 AM MRN: [id number] Endoscopist: Milus Banister , MD Age: 83 Referring MD:  Date of Birth: [birth year] Gender: Male Account #: [id number] Procedure:                Colonoscopy Indications:              Hematochezia; 20 pound weight loss Medicines:                Monitored Anesthesia Care Procedure:                Pre-Anesthesia Assessment:                           - Prior to the procedure, a History and Physical                            was performed, and patient medications and                            allergies were reviewed. The patient's tolerance of                            previous anesthesia was also reviewed. The risks                            and benefits of the procedure and the sedation                            options and risks were discussed with the patient.                            All questions were answered, and informed consent                            was obtained. Prior Anticoagulants: The patient has                            taken no previous anticoagulant or antiplatelet                            agents. ASA Grade Assessment: II - A patient with                            mild systemic disease. After reviewing the risks                            and benefits, the patient was deemed in                            satisfactory condition to undergo the procedure.                           After obtaining informed consent, the colonoscope  was passed under direct vision. Throughout the                            procedure, the patient's blood pressure, pulse, and                            oxygen saturations were monitored continuously. The                            Colonoscope was introduced through the anus and                            advanced to the the terminal ileum. The colonoscopy                            was performed without  difficulty. The patient                            tolerated the procedure well. The quality of the                            bowel preparation was good. The terminal ileum,                            ileocecal valve, appendiceal orifice, and rectum                            were photographed. Scope In: 10:49:31 AM Scope Out: 11:07:46 AM Scope Withdrawal Time: 0 hours 14 minutes 18 seconds  Total Procedure Duration: 0 hours 18 minutes 15 seconds  Findings:                 The terminal ileum was moderately inflamed and                            ulcerated. Biopsies were taken with a cold forceps                            for histology (jar1).                           The mucosa in the right colon was normal (sampled                            with biopsy, jar 2).                           Two sessile polyps were found in the ascending                            colon. The polyps were 4 to 6 mm in size. These                            polyps were removed with a cold snare. Resection  and retrieval were complete (jar 3).                           There were some small areas of patchy inflammation                            (skip lesions ?) in the left colon but the majority                            of the mucosa was normal appearing. Biopsies were                            taken with a cold forceps for histology (jar 4).                           The rectosigmoid region was moderately inflamed (up                            to 20cm from the anus). Biopsies were taken (jar 5)                           The exam was otherwise without abnormality on                            direct and retroflexion views. Complications:            No immediate complications. Estimated blood loss:                            None. Estimated Blood Loss:     Estimated blood loss: none. Impression:               - Ileocolitis, multiple biopsies taken.                           -  Two typical appearing polyps in the ascending                            segment, removed and sent to pathology. Recommendation:           - Patient has a contact number available for                            emergencies. The signs and symptoms of potential                            delayed complications were discussed with the                            patient. Return to normal activities tomorrow.                            Written discharge instructions were provided to the  patient.                           - Resume previous diet.                           - Continue present medications.                           - Await pathology results. For now, please start                            new prescription suppository (canasa 1gram, insert                            one nightly, disp 30 with 6 refills). Milus Banister, MD 12/30/2016 11:17:50 AM This report has been signed electronically.

## 2016-12-30 NOTE — Progress Notes (Signed)
Report to PACU, RN, vss, BBS= Clear.  

## 2016-12-31 ENCOUNTER — Telehealth: Payer: Self-pay | Admitting: *Deleted

## 2016-12-31 NOTE — Telephone Encounter (Signed)
  Follow up Call-  Call back number 12/30/2016  Post procedure Call Back phone  # [phone]  Permission to leave phone message Yes  Some recent data might be hidden     Patient questions:  Do you have a fever, pain , or abdominal swelling? No. Pain Score  0 *  Have you tolerated food without any problems? Yes.    Have you been able to return to your normal activities? No.  Do you have any questions about your discharge instructions: Diet   No. Medications  No. Follow up visit  No.  Do you have questions or concerns about your Care? No.  Actions: * If pain score is 4 or above: No action needed, pain <4.

## 2017-01-07 ENCOUNTER — Other Ambulatory Visit: Payer: Self-pay

## 2017-01-07 DIAGNOSIS — C8599 Non-Hodgkin lymphoma, unspecified, extranodal and solid organ sites: Secondary | ICD-10-CM

## 2017-01-08 ENCOUNTER — Other Ambulatory Visit (HOSPITAL_COMMUNITY): Admission: RE | Admit: 2017-01-08 | Payer: Medicare Other | Source: Ambulatory Visit | Admitting: Gastroenterology

## 2017-01-15 ENCOUNTER — Telehealth: Payer: Self-pay | Admitting: Gastroenterology

## 2017-01-15 NOTE — Telephone Encounter (Signed)
I have called hematology and asked about referral, I was advised the records are being reviewed and the pt will be called with an appt.  The additional path testing is still in process and will be called to the pt when reviewed.  The pt is aware and will call if he has not heard from hematology in 1 week.

## 2017-01-16 LAB — TISSUE HYBRIDIZATION TO NCBH

## 2017-01-21 ENCOUNTER — Encounter (HOSPITAL_COMMUNITY): Payer: Self-pay

## 2017-01-24 ENCOUNTER — Telehealth: Payer: Self-pay | Admitting: Oncology

## 2017-01-24 ENCOUNTER — Ambulatory Visit (HOSPITAL_BASED_OUTPATIENT_CLINIC_OR_DEPARTMENT_OTHER): Payer: Medicare Other | Admitting: Oncology

## 2017-01-24 ENCOUNTER — Ambulatory Visit (HOSPITAL_BASED_OUTPATIENT_CLINIC_OR_DEPARTMENT_OTHER): Payer: Medicare Other

## 2017-01-24 ENCOUNTER — Encounter: Payer: Self-pay | Admitting: Oncology

## 2017-01-24 VITALS — BP 97/69 | HR 87 | Temp 98.0°F | Resp 16 | Ht 71.0 in | Wt 187.5 lb

## 2017-01-24 DIAGNOSIS — I959 Hypotension, unspecified: Secondary | ICD-10-CM

## 2017-01-24 DIAGNOSIS — Z801 Family history of malignant neoplasm of trachea, bronchus and lung: Secondary | ICD-10-CM

## 2017-01-24 DIAGNOSIS — Z803 Family history of malignant neoplasm of breast: Secondary | ICD-10-CM

## 2017-01-24 DIAGNOSIS — K625 Hemorrhage of anus and rectum: Secondary | ICD-10-CM | POA: Diagnosis not present

## 2017-01-24 DIAGNOSIS — C8290 Follicular lymphoma, unspecified, unspecified site: Secondary | ICD-10-CM

## 2017-01-24 DIAGNOSIS — I10 Essential (primary) hypertension: Secondary | ICD-10-CM

## 2017-01-24 DIAGNOSIS — R634 Abnormal weight loss: Secondary | ICD-10-CM | POA: Diagnosis not present

## 2017-01-24 DIAGNOSIS — C8299 Follicular lymphoma, unspecified, extranodal and solid organ sites: Secondary | ICD-10-CM

## 2017-01-24 LAB — CBC WITH DIFFERENTIAL/PLATELET
BASO%: 0.8 % (ref 0.0–2.0)
Basophils Absolute: 0.1 10*3/uL (ref 0.0–0.1)
EOS%: 2.1 % (ref 0.0–7.0)
Eosinophils Absolute: 0.2 10*3/uL (ref 0.0–0.5)
HCT: 43.1 % (ref 38.4–49.9)
HEMOGLOBIN: 14.4 g/dL (ref 13.0–17.1)
LYMPH%: 25.1 % (ref 14.0–49.0)
MCH: 31.4 pg (ref 27.2–33.4)
MCHC: 33.4 g/dL (ref 32.0–36.0)
MCV: 94 fL (ref 79.3–98.0)
MONO#: 0.7 10*3/uL (ref 0.1–0.9)
MONO%: 9 % (ref 0.0–14.0)
NEUT%: 63 % (ref 39.0–75.0)
NEUTROS ABS: 5.2 10*3/uL (ref 1.5–6.5)
PLATELETS: 280 10*3/uL (ref 140–400)
RBC: 4.59 10*6/uL (ref 4.20–5.82)
RDW: 13.7 % (ref 11.0–14.6)
WBC: 8.3 10*3/uL (ref 4.0–10.3)
lymph#: 2.1 10*3/uL (ref 0.9–3.3)

## 2017-01-24 LAB — COMPREHENSIVE METABOLIC PANEL
ALBUMIN: 2.6 g/dL — AB (ref 3.5–5.0)
ALK PHOS: 77 U/L (ref 40–150)
ALT: 12 U/L (ref 0–55)
AST: 15 U/L (ref 5–34)
Anion Gap: 9 mEq/L (ref 3–11)
BILIRUBIN TOTAL: 0.44 mg/dL (ref 0.20–1.20)
BUN: 16.7 mg/dL (ref 7.0–26.0)
CALCIUM: 8.8 mg/dL (ref 8.4–10.4)
CO2: 26 mEq/L (ref 22–29)
CREATININE: 1.2 mg/dL (ref 0.7–1.3)
Chloride: 105 mEq/L (ref 98–109)
EGFR: 58 mL/min/{1.73_m2} — ABNORMAL LOW (ref >90)
GLUCOSE: 88 mg/dL (ref 70–140)
Potassium: 3.9 mEq/L (ref 3.5–5.1)
SODIUM: 140 meq/L (ref 136–145)
TOTAL PROTEIN: 6.1 g/dL — AB (ref 6.4–8.3)

## 2017-01-24 LAB — URIC ACID: Uric Acid, Serum: 7.3 mg/dl (ref 2.6–7.4)

## 2017-01-24 LAB — LACTATE DEHYDROGENASE: LDH: 168 U/L (ref 125–245)

## 2017-01-24 NOTE — Progress Notes (Signed)
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  HISTORY OF PRESENTING ILLNESS: [name]. 83 y.o. male is here because of  recent diagnosis of follicular lymphoma.   Patient has known history of hypertension, gastroesophageal reflux disease, hypercholesterolemia has been losing weight for the last 6 months.    Patient also has been having periodic rectal bleeding hence he was advised to have a consultation with a gastroenterologist.  Patient had a colonoscopy performed a in 2008 found to have 1 polyp. Patient was advised to have a repeat colonoscopy 5 years later however patient did not go for her next colonoscopy.   Eventually due to chronic rectal bleeding,  patient had a colonoscopy by his gastroenterologist, performed on the 12/30/2016.  According to the gastroenterologist report, his terminal ileum was moderately inflamed and ulcerated,  biopsies were taken with cold forceps, 2 sessile polyps were found in the ascending colon,  the rectosigmoid region was moderately inflamed,  biopsies were taken exam otherwise was normal under direct retroflexion views. A clinical diagnosis has been  made as ileocolitis by the gastroenterologist  However pathologist has reported, small bowel mucosa with atypical lymphoid infiltrates and scattered active inflammation.  A right colon biopsy also showed colonic mucosa with atypical lymphoid infiltrates and scattered active inflammatory cells.  Surgical biopsy of the ascending colon polyp was documented as tubular adenoma with the atypical lymphoid infiltrates.   Left colon biopsy also has revealed atypical lymphoid infiltrates with scattered inflammatory cells. Biopsy of the rectal mucosa  has revealed atypical  lymphoid infiltrates and active inflammatory cells without any dysplasia.   Microscopic examination with special stains has revealed infiltrates positive for CD20 cells, positive for BCL 6 and BCL-2 with high Ki 67 at 50%   Hence a presumptive diagnosis of follicular lymphoma has been made.  The above or samples were also sent to the [facility] for karyotype analysis, however no evidence of  BCL 6 or BCL-2 disruption.   Today patient presented to the hematology clinic to discuss further management options. Patient has been accompanied by his wife and daughter to the clinic.  Patient feels,  still has been seeing blood in his stool on periodic basis.  He complains of feeling tiredness. He also feels,  he has no appetite. However he denies night sweats, mucosal bleeding or lymph gland enlargement.   Review of Systems  Constitutional: Positive for appetite change (Patient feels he has no taste nor appetite ), fatigue (Complaints of fatigue) and unexpected weight change (Patient lost about 25 pounds in 6 months). Negative for chills, diaphoresis and fever.  HENT:   Negative for hearing loss, lump/mass, mouth sores, nosebleeds, sore throat, tinnitus and trouble swallowing.   Eyes: Negative for icterus.  Respiratory: Negative for chest tightness, cough, hemoptysis, shortness of breath and wheezing.   Cardiovascular: Negative for chest pain, leg swelling and palpitations.  Gastrointestinal: Negative for abdominal distention, abdominal pain, blood in stool, constipation, diarrhea, nausea, rectal pain and vomiting.  Endocrine: Negative for hot flashes.  Genitourinary: Negative for bladder incontinence, difficulty urinating, dysuria, frequency, hematuria, nocturia and pelvic pain.   Musculoskeletal: Negative for arthralgias, back pain, flank pain, gait problem, myalgias, neck pain and neck stiffness.  Skin: Negative for itching and rash.  Neurological: Negative for dizziness, extremity  weakness,  gait problem, headaches, light-headedness, numbness, seizures and speech difficulty.  Hematological: Negative for adenopathy. Does not bruise/bleed easily.  Psychiatric/Behavioral: Negative for confusion, decreased concentration, sleep disturbance and suicidal ideas. The patient is not nervous/anxious.     MEDICAL HISTORY: Past Medical History:  Diagnosis Date  . Coronary artery disease    a. stent (promus) Cx/OM'09  . Fibromyalgia dx'd ~ 04/2015  . GERD (gastroesophageal reflux disease)   . Gout   . HEARING LOSS    "temporary"  . Hyperlipidemia   . HYPERLIPIDEMIA 10/13/2007  . Hypertension   . HYPERTENSION 10/13/2007  . Osteoarthritis    "left shoulder" (08/23/2015)  . Prostatitis, acute   . PROSTATITIS, ACUTE, HX OF 10/13/2007  . PSORIASIS 10/13/2007  . PVC (premature ventricular contraction)    hx  . SKIN CANCER, RECURRENT 10/13/2007    SURGICAL HISTORY: Past Surgical History:  Procedure Laterality Date  . APPENDECTOMY    . CORONARY ANGIOPLASTY WITH STENT PLACEMENT    . CYST EXCISION Right X 2   shoulder  . ELECTROLYSIS OF MISDIRECTED LASHES Bilateral    eyelashes are misdirected and grow inwards   . EYE SURGERY    . MASS EXCISION Right 01/2005   proximal thigh soft tissue mass  . TEAR DUCT PROBING Left   . TYMPANOPLASTY Bilateral    "for hearing loss; put tubes in also, 2X on right, 1X on the left; tubes worked"  . VASECTOMY      SOCIAL HISTORY: Social History   Social History  . Marital status: Married    Spouse name: N/A  . Number of children: 3  . Years of education: N/A   Occupational History  . retired Retired    from [employer].   Social History Main Topics  . Smoking status: Never Smoker  . Smokeless tobacco: Never Used  . Alcohol use No  . Drug use: No  . Sexual activity: No   Other Topics Concern  . Not on file   Social History Narrative   Married 1958   2 daughters- '59, 68, 1 son '61   8 grandchildren   Retired from [employer],  works PT as a Curator. Now retired completely (09/2008)   End of life; does not want heroic measures if in a persistent vegative state    FAMILY HISTORY Family History  Problem Relation Age of Onset  . Heart attack Father 1    died  . Heart disease Father   . Parkinsonism Mother 74    died  . Breast cancer Sister     died  . Lung cancer      uncle died  . Colon cancer Neg Hx     ALLERGIES:  is allergic to niacin.  MEDICATIONS:  Current Outpatient Prescriptions  Medication Sig Dispense Refill  . aspirin 81 MG tablet Take 81 mg by mouth daily.      [name] atenolol (TENORMIN) 25 MG tablet TAKE ONE-HALF TABLET BY MOUTH ONCE DAILY. 45 tablet 3  . lisinopril (PRINIVIL,ZESTRIL) 5 MG tablet Take 1 tablet (5 mg total) by mouth daily. 90 tablet 3  . mesalamine (CANASA) 1000 MG suppository Place 1 suppository (1,000 mg total) rectally at bedtime. 30 suppository 6  . naproxen sodium (ANAPROX) 220 MG tablet Take 220 mg by mouth as needed.    . nitroGLYCERIN (NITROSTAT) 0.4 MG SL tablet DISSOLVE ONE TABLET UNDER THE TONGUE EVERY 5 MINUTES AS NEEDED (Patient taking differently: Place 0.4 mg under the tongue every 5 (five) minutes as needed for  chest pain. ) 25 tablet 12  . omeprazole (PRILOSEC OTC) 20 MG tablet Take 20 mg by mouth daily.    . rosuvastatin (CRESTOR) 10 MG tablet Take one tablet by mouth daily prior to bedtime (Patient taking differently: Take 10 mg by mouth at bedtime. ) 90 tablet 3  . triamterene-hydrochlorothiazide (MAXZIDE-25) 37.5-25 MG tablet Take 1 tablet by mouth daily.     Current Facility-Administered Medications  Medication Dose Route Frequency Provider Last Rate Last Dose  . 0.9 %  sodium chloride infusion  500 mL Intravenous Continuous Milus Banister, MD        PHYSICAL EXAMINATION:  ECOG PERFORMANCE STATUS: 0 - Asymptomatic   Vitals:   01/24/17 0945  BP: 97/69  Pulse: 87  Resp: 16  Temp: 98 F (36.7 C)    Filed Weights   01/24/17 0945  Weight: 187 lb  8 oz (85 kg)     Physical Exam  Constitutional: He is oriented to person, place, and time. No distress.  Patient appeared somewhat cachectic, moderately nourished well-built  HENT:  Head: Normocephalic and atraumatic.  Right Ear: External ear normal.  Left Ear: External ear normal.  Mouth/Throat: No oropharyngeal exudate.  Eyes: Conjunctivae and EOM are normal. Pupils are equal, round, and reactive to light. Right eye exhibits no discharge. Left eye exhibits no discharge. No scleral icterus.  Neck: No JVD present. No tracheal deviation present. No thyromegaly present.  Cardiovascular: Normal rate and regular rhythm.  Exam reveals no gallop and no friction rub.   No murmur heard. Pulmonary/Chest: Breath sounds normal. No stridor. No respiratory distress. He has no wheezes. He has no rales. He exhibits no tenderness.  Abdominal: Soft. Bowel sounds are normal. He exhibits no distension and no mass. There is no tenderness. There is no rebound and no guarding.  Musculoskeletal: Normal range of motion. He exhibits no edema, tenderness or deformity.  Lymphadenopathy:    He has no cervical adenopathy.  Neurological: He is alert and oriented to person, place, and time. No cranial nerve deficit. Gait normal. Coordination normal.  Skin: Skin is warm and dry. No rash noted. He is not diaphoretic. No erythema. No pallor.  Psychiatric: Mood, memory, affect and judgment normal.     LABORATORY DATA: I have personally reviewed the data as listed:  Appointment on 01/24/2017  Component Date Value Ref Range Status  . WBC 01/24/2017 8.3  4.0 - 10.3 10e3/uL Final  . NEUT# 01/24/2017 5.2  1.5 - 6.5 10e3/uL Final  . HGB 01/24/2017 14.4  13.0 - 17.1 g/dL Final  . HCT 01/24/2017 43.1  38.4 - 49.9 % Final  . Platelets 01/24/2017 280  140 - 400 10e3/uL Final  . MCV 01/24/2017 94.0  79.3 - 98.0 fL Final  . MCH 01/24/2017 31.4  27.2 - 33.4 pg Final  . MCHC 01/24/2017 33.4  32.0 - 36.0 g/dL Final  . RBC  01/24/2017 4.59  4.20 - 5.82 10e6/uL Final  . RDW 01/24/2017 13.7  11.0 - 14.6 % Final  . lymph# 01/24/2017 2.1  0.9 - 3.3 10e3/uL Final  . MONO# 01/24/2017 0.7  0.1 - 0.9 10e3/uL Final  . Eosinophils Absolute 01/24/2017 0.2  0.0 - 0.5 10e3/uL Final  . Basophils Absolute 01/24/2017 0.1  0.0 - 0.1 10e3/uL Final  . NEUT% 01/24/2017 63.0  39.0 - 75.0 % Final  . LYMPH% 01/24/2017 25.1  14.0 - 49.0 % Final  . MONO% 01/24/2017 9.0  0.0 - 14.0 % Final  . EOS% 01/24/2017  2.1  0.0 - 7.0 % Final  . BASO% 01/24/2017 0.8  0.0 - 2.0 % Final  . Sodium 01/24/2017 140  136 - 145 mEq/L Final  . Potassium 01/24/2017 3.9  3.5 - 5.1 mEq/L Final  . Chloride 01/24/2017 105  98 - 109 mEq/L Final  . CO2 01/24/2017 26  22 - 29 mEq/L Final  . Glucose 01/24/2017 88  70 - 140 mg/dl Final  . BUN 01/24/2017 16.7  7.0 - 26.0 mg/dL Final  . Creatinine 01/24/2017 1.2  0.7 - 1.3 mg/dL Final  . Total Bilirubin 01/24/2017 0.44  0.20 - 1.20 mg/dL Final  . Alkaline Phosphatase 01/24/2017 77  40 - 150 U/L Final  . AST 01/24/2017 15  5 - 34 U/L Final  . ALT 01/24/2017 12  0 - 55 U/L Final  . Total Protein 01/24/2017 6.1* 6.4 - 8.3 g/dL Final  . Albumin 01/24/2017 2.6* 3.5 - 5.0 g/dL Final  . Calcium 01/24/2017 8.8  8.4 - 10.4 mg/dL Final  . Anion Gap 01/24/2017 9  3 - 11 mEq/L Final  . EGFR 01/24/2017 58* >90 ml/min/1.73 m2 Final  . LDH 01/24/2017 168  125 - 245 U/L Final  . Uric Acid, Serum 01/24/2017 7.3  2.6 - 7.4 mg/dl Final  Hospital Outpatient Visit on 12/30/2016  Component Date Value Ref Range Status  . Tissue hypridization to [facility] 12/30/2016 SEE SEPARATE REPORT   Final    ASSESSMENT/PLAN  1. Newly diagnosed  follicular lymphoma of the GI tract 2. History of hypertension 3. History of hypercholesterolemia 4. Significant weight loss. 5. Chronic rectal bleeding. 6. Hypotension   [name] is a 83 year old Caucasian gentleman was recently diagnosed with at abnormal lymphoid infiltrates of the  small intestine and large intestine.   Histological evaluation and special stains are suspicious for follicular lymphoma.   At  patient is hypotensive. Hence I have advised to stop his diuretics.  I have also advised to call his primary care provider to adjust his blood pressure medications.  At this time it is not clear whether patient has any other abnormal lymphadenopathy. Hence I have recommended to have a PET scan. I have also advised to have a repeat CBC, CMP, beta-2 microglobulin, LDH and uric acid evaluation.  I also recommended a bone marrow biopsy which has been scheduled for next week in the outpatient clinic.  I once again thank his primary care provider for giving me the opportunity to participate in his care.  Orders Placed This Encounter  Procedures  . NM PET Image Initial (PI) Whole Body    Standing Status:   Future    Standing Expiration Date:   01/24/2018    Order Specific Question:   Reason for Exam (SYMPTOM  OR DIAGNOSIS REQUIRED)    Answer:   follicular lymphoma    Order Specific Question:   If indicated for the ordered procedure, I authorize the administration of a radiopharmaceutical per Radiology protocol    Answer:   Yes    Order Specific Question:   Preferred imaging location?    Answer:   [facility]    Order Specific Question:   Radiology Contrast Protocol - do NOT remove file path    Answer:   _[email or web address]  . CBC with Differential    Standing Status:   Future    Number of Occurrences:   1    Standing Expiration Date:   01/24/2018  . Comprehensive metabolic panel    Standing Status:  Future    Number of Occurrences:   1    Standing Expiration Date:   01/24/2018  . Lactate dehydrogenase (LDH)    Standing Status:   Future    Number of Occurrences:   1    Standing Expiration Date:   01/24/2018  . Beta 2 microglobulin    Standing Status:   Future    Number of Occurrences:   1    Standing Expiration Date:   01/24/2018   . Uric acid    Standing Status:   Future    Number of Occurrences:   1    Standing Expiration Date:   01/24/2018    All questions were answered. The patient knows to call the clinic with any problems, questions or concerns.  This note was electronically signed.    Judge Stall, MD  01/24/2017 12:38 PM

## 2017-01-24 NOTE — Telephone Encounter (Signed)
Appointments scheduled per 3.30.18 LOS. Patient given AVS report and calendars with future scheduled appointments. °

## 2017-01-25 LAB — BETA 2 MICROGLOBULIN, SERUM: BETA 2: 3.6 mg/L — AB (ref 0.6–2.4)

## 2017-01-27 ENCOUNTER — Encounter (HOSPITAL_COMMUNITY): Payer: Self-pay

## 2017-01-27 ENCOUNTER — Telehealth: Payer: Self-pay | Admitting: Gastroenterology

## 2017-01-27 ENCOUNTER — Emergency Department (HOSPITAL_COMMUNITY)
Admission: EM | Admit: 2017-01-27 | Discharge: 2017-01-27 | Disposition: A | Discharge status: Home / Self Care | Payer: Medicare Other | Attending: Emergency Medicine | Admitting: Emergency Medicine

## 2017-01-27 DIAGNOSIS — Z85828 Personal history of other malignant neoplasm of skin: Secondary | ICD-10-CM | POA: Insufficient documentation

## 2017-01-27 DIAGNOSIS — I10 Essential (primary) hypertension: Secondary | ICD-10-CM | POA: Insufficient documentation

## 2017-01-27 DIAGNOSIS — K6289 Other specified diseases of anus and rectum: Secondary | ICD-10-CM

## 2017-01-27 DIAGNOSIS — I2510 Atherosclerotic heart disease of native coronary artery without angina pectoris: Secondary | ICD-10-CM | POA: Diagnosis not present

## 2017-01-27 DIAGNOSIS — Z955 Presence of coronary angioplasty implant and graft: Secondary | ICD-10-CM | POA: Insufficient documentation

## 2017-01-27 DIAGNOSIS — K625 Hemorrhage of anus and rectum: Secondary | ICD-10-CM | POA: Diagnosis not present

## 2017-01-27 DIAGNOSIS — Z79899 Other long term (current) drug therapy: Secondary | ICD-10-CM | POA: Insufficient documentation

## 2017-01-27 LAB — COMPREHENSIVE METABOLIC PANEL
ALBUMIN: 2.4 g/dL — AB (ref 3.5–5.0)
ALT: 15 U/L — ABNORMAL LOW (ref 17–63)
ANION GAP: 9 (ref 5–15)
AST: 21 U/L (ref 15–41)
Alkaline Phosphatase: 65 U/L (ref 38–126)
BUN: 8 mg/dL (ref 6–20)
CHLORIDE: 106 mmol/L (ref 101–111)
CO2: 26 mmol/L (ref 22–32)
Calcium: 8.3 mg/dL — ABNORMAL LOW (ref 8.9–10.3)
Creatinine, Ser: 1.06 mg/dL (ref 0.61–1.24)
GFR calc Af Amer: >60 mL/min (ref >60)
GFR calc non Af Amer: >60 mL/min (ref >60)
GLUCOSE: 95 mg/dL (ref 65–99)
POTASSIUM: 3.6 mmol/L (ref 3.5–5.1)
Sodium: 141 mmol/L (ref 135–145)
Total Bilirubin: 0.6 mg/dL (ref 0.3–1.2)
Total Protein: 5.8 g/dL — ABNORMAL LOW (ref 6.5–8.1)

## 2017-01-27 LAB — CBC
HEMATOCRIT: 40.6 % (ref 39.0–52.0)
HEMOGLOBIN: 13.2 g/dL (ref 13.0–17.0)
MCH: 30.5 pg (ref 26.0–34.0)
MCHC: 32.5 g/dL (ref 30.0–36.0)
MCV: 93.8 fL (ref 78.0–100.0)
Platelets: 277 10*3/uL (ref 150–400)
RBC: 4.33 MIL/uL (ref 4.22–5.81)
RDW: 13.8 % (ref 11.5–15.5)
WBC: 9.1 10*3/uL (ref 4.0–10.5)

## 2017-01-27 LAB — TYPE AND SCREEN
ABO/RH(D): A NEG
Antibody Screen: NEGATIVE

## 2017-01-27 LAB — ABO/RH: ABO/RH(D): A NEG

## 2017-01-27 LAB — POC OCCULT BLOOD, ED: Fecal Occult Bld: POSITIVE — AB

## 2017-01-27 MED ORDER — TRAMADOL HCL 50 MG PO TABS
100.0000 mg | ORAL_TABLET | Freq: Once | ORAL | Status: AC
  Administered 2017-01-27: 100 mg via ORAL
  Filled 2017-01-27: qty 2

## 2017-01-27 MED ORDER — DOCUSATE SODIUM 250 MG PO CAPS: 250.0000 mg | ORAL_CAPSULE | Freq: Every day | ORAL | 0 refills | Status: DC

## 2017-01-27 MED ORDER — TRAMADOL HCL 50 MG PO TABS: 100.0000 mg | ORAL_TABLET | Freq: Three times a day (TID) | ORAL | 0 refills | Status: DC

## 2017-01-27 MED ORDER — TRAMADOL HCL 50 MG PO TABS: 100.0000 mg | ORAL_TABLET | Freq: Four times a day (QID) | ORAL | Status: DC

## 2017-01-27 NOTE — ED Notes (Signed)
Pt given saltines and peanut butter 

## 2017-01-27 NOTE — ED Provider Notes (Signed)
[facility] DEPT Provider Note   CSN: [id number] Arrival date & time: 01/27/17  1708     History   Chief Complaint Chief Complaint  Patient presents with  . Rectal Bleeding    HPI [name]. is a 83 y.o. male.  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 He presents with complaint of worsening pain around his tailbone and increase in chronic GI bleeding. He reports that he has had bright red blood per rectum for 6 months off and on which has worsened over the last several weeks. He does note that he has some underlying hemorrhoids and typically only has blood on toilet tissue after bowel movement. Recently, he's noticed bright red blood in the toilet bowl. Patient denies any lightheadedness or shortness of breath. His hemoglobin is 13, down from 14 which was last checked 3 days ago.  Patient reports he has been taking Tylenol for control of his pain which has been successful until recently. Last night patient was unable to sleep due to his pain. His GI doctor recommended that he presented to the emergency department for pain control. Patient is narcotic nave and has never had opioid pain medications. Patient reports that he would like to follow up with his previously scheduled heme/onc appointment tomorrow morning.      Past Medical History:  Diagnosis Date  . Coronary artery disease    a. stent (promus) Cx/OM'09  . Fibromyalgia dx'd ~ 04/2015  . GERD (gastroesophageal reflux disease)   . Gout   . HEARING LOSS    "temporary"  . Hyperlipidemia   . HYPERLIPIDEMIA 10/13/2007  . Hypertension   . HYPERTENSION 10/13/2007  . Osteoarthritis    "left shoulder" (08/23/2015)  . Prostatitis, acute   . PROSTATITIS, ACUTE, HX OF 10/13/2007  . PSORIASIS 10/13/2007  . PVC (premature ventricular contraction)    hx  . SKIN CANCER, RECURRENT 10/13/2007    Patient Active Problem List   Diagnosis Date Noted  . Coronary artery  disease   . IFG (impaired fasting glucose) 10/03/2015  . PMR (polymyalgia rheumatica) (HCC) 09/07/2015  . Orthostatic hypotension 08/24/2015  . Viral syndrome 08/24/2015  . GERD (gastroesophageal reflux disease) 03/24/2014  . ACP (advance care planning) 11/02/2013  . Left upper arm pain 09/15/2012  . Routine health maintenance 10/29/2011  . HEARING LOSS 11/05/2010  . Chest pain 11/06/2009  . Coronary atherosclerosis 01/16/2009  . SKIN CANCER, RECURRENT 10/13/2007  . Hyperlipidemia 10/13/2007  . Gout 10/13/2007  . Essential hypertension 10/13/2007  . PVC (premature ventricular contraction) 10/13/2007  . PSORIASIS 10/13/2007  . OSTEOARTHRITIS, ANKLE, RIGHT 10/13/2007  . Other acquired absence of organ 10/13/2007    Past Surgical History:  Procedure Laterality Date  . APPENDECTOMY    . CORONARY ANGIOPLASTY WITH STENT PLACEMENT    . CYST EXCISION Right X 2   shoulder  . ELECTROLYSIS OF MISDIRECTED LASHES Bilateral    eyelashes are misdirected and grow inwards   . EYE SURGERY    . MASS EXCISION Right 01/2005   proximal thigh soft tissue mass  . TEAR DUCT PROBING Left   . TYMPANOPLASTY Bilateral    "for hearing loss; put tubes in also, 2X on right, 1X on the left; tubes worked"  . VASECTOMY         Home Medications    Prior to Admission medications   Medication Sig Start Date End Date Taking? Authorizing Provider  atenolol (TENORMIN) 25 MG  tablet TAKE ONE-HALF TABLET BY MOUTH ONCE DAILY. 10/22/16  Yes Fay Records, MD  lisinopril (PRINIVIL,ZESTRIL) 5 MG tablet Take 1 tablet (5 mg total) by mouth daily. 09/08/15  Yes Fay Records, MD  mesalamine (CANASA) 1000 MG suppository Place 1 suppository (1,000 mg total) rectally at bedtime. 12/30/16  Yes Milus Banister, MD  nitroGLYCERIN (NITROSTAT) 0.4 MG SL tablet DISSOLVE ONE TABLET UNDER THE TONGUE EVERY 5 MINUTES AS NEEDED Patient taking differently: Place 0.4 mg under the tongue every 5 (five) minutes as needed for chest pain.   09/20/15  Yes Fay Records, MD  rosuvastatin (CRESTOR) 10 MG tablet Take one tablet by mouth daily prior to bedtime Patient taking differently: Take 10 mg by mouth at bedtime.  08/05/16  Yes Fay Records, MD  triamterene-hydrochlorothiazide (MAXZIDE-25) 37.5-25 MG tablet Take 1 tablet by mouth daily.   Yes Historical Provider, MD  docusate sodium (COLACE) 250 MG capsule Take 1 capsule (250 mg total) by mouth daily. 01/27/17   Holley Raring, MD  naproxen sodium (ANAPROX) 220 MG tablet Take 220 mg by mouth as needed.    Historical Provider, MD  omeprazole (PRILOSEC OTC) 20 MG tablet Take 20 mg by mouth daily.    Historical Provider, MD  traMADol (ULTRAM) 50 MG tablet Take 2 tablets (100 mg total) by mouth 3 (three) times daily. 01/27/17   Holley Raring, MD    Family History Family History  Problem Relation Age of Onset  . Heart attack Father 16    died  . Heart disease Father   . Parkinsonism Mother 73    died  . Breast cancer Sister     died  . Lung cancer      uncle died  . Colon cancer Neg Hx     Social History Social History  Substance Use Topics  . Smoking status: Never Smoker  . Smokeless tobacco: Never Used  . Alcohol use No     Allergies   Niacin   Review of Systems Review of Systems  Constitutional: Negative for chills and fever.  HENT: Negative for ear pain and sore throat.   Eyes: Negative for pain and visual disturbance.  Respiratory: Negative for cough and shortness of breath.   Cardiovascular: Negative for chest pain and palpitations.  Gastrointestinal: Positive for anal bleeding, blood in stool and rectal pain. Negative for abdominal pain, constipation, diarrhea and vomiting.  Genitourinary: Negative for dysuria and hematuria.  Musculoskeletal: Negative for arthralgias and back pain.  Skin: Negative for color change and rash.  Neurological: Negative for seizures and syncope.  All other systems reviewed and are negative.    Physical Exam Updated Vital  Signs BP 127/73   Pulse 65   Temp 98.1 F (36.7 C) (Oral)   Resp 18   SpO2 100%   Physical Exam  Constitutional: He appears well-developed and well-nourished.  HENT:  Head: Normocephalic and atraumatic.  Eyes: Conjunctivae are normal.  Neck: Neck supple.  Cardiovascular: Normal rate and regular rhythm.   No murmur heard. Pulmonary/Chest: Effort normal and breath sounds normal. No respiratory distress.  Abdominal: Soft. He exhibits no distension. There is no tenderness. There is no guarding.  Genitourinary: Rectum normal and prostate normal.  Musculoskeletal: He exhibits no edema.  Neurological: He is alert.  Skin: Skin is warm and dry.  Psychiatric: He has a normal mood and affect.  Nursing note and vitals reviewed.  ED Treatments / Results  Labs (all labs ordered are listed, but only  abnormal results are displayed) Labs Reviewed  COMPREHENSIVE METABOLIC PANEL - Abnormal; Notable for the following:       Result Value   Calcium 8.3 (*)    Total Protein 5.8 (*)    Albumin 2.4 (*)    ALT 15 (*)    All other components within normal limits  POC OCCULT BLOOD, ED - Abnormal; Notable for the following:    Fecal Occult Bld POSITIVE (*)    All other components within normal limits  CBC  TYPE AND SCREEN  ABO/RH    EKG  EKG Interpretation None       Radiology No results found.  Procedures Procedures (including critical care time)  Medications Ordered in ED Medications  traMADol (ULTRAM) tablet 100 mg (100 mg Oral Given 01/27/17 2008)    Initial Impression / Assessment and Plan / ED Course  I have reviewed the triage vital signs and the nursing notes.  Pertinent labs & imaging results that were available during my care of the patient were reviewed by me and considered in my medical decision making (see chart for details).  Clinical Course as of Jan 28 2044  Mon Jan 27, 2017  1945 Pt w/ recent dx of follicular lymphoma on C-scope bx 4 wks ago. Has had  intermittent GIB over last several months, recently worsened. Presents with pain in "tail bone."  [BS]  1947 Drop from 14.4 g/mL 3 days ago (14.6 a month prior). Hemoglobin: 13.2 [BS]  1957 HDS w/o tachycardia. Generally weak but ambulatory. Requests pain control and would like to f/u w/ Heme/Onc tomorrow as previously scheduled.  [BS]  1958 Pulse Rate: 65 [BS]  1958 BP: 127/73 [BS]  2009 S/w cardiology who advised that pt can reasonable d/c ASA in the setting of active GIB. Pt was taking for 2nd ppx after stent placed ~54yrs ago. Pt will be instructed to f/u with his Cardiologist, Dr. Harrington Challenger.  [BS]  2033 Fecal Occult Blood, POC: (!) POSITIVE [BS]  2033 Pt is feeling improved after pain medication. He was able to tolerate PO hydration.  [BS]    Clinical Course User Index [BS] Holley Raring, MD   Final Clinical Impressions(s) / ED Diagnoses   Final diagnoses:  Rectal bleeding  Rectal pain    New Prescriptions New Prescriptions   DOCUSATE SODIUM (COLACE) 250 MG CAPSULE    Take 1 capsule (250 mg total) by mouth daily.   TRAMADOL (ULTRAM) 50 MG TABLET    Take 2 tablets (100 mg total) by mouth 3 (three) times daily.     Holley Raring, MD 01/27/17 7741    Merrily Pew, MD 01/29/17 [phone]

## 2017-01-27 NOTE — Telephone Encounter (Signed)
Sorry to hear he feels so badly I think it is best if he get seen in the ED unless he wants to wait until he sees oncologist It is my sense that his sxs are such that he needs IVF's and pain control but I cannot get that done over the phone, etc

## 2017-01-27 NOTE — Discharge Instructions (Signed)
We have given you pain medications which you can take for the pain around your tail bone. You can take this up to three times a day. Please continue to follow up with your other doctors.  We discussed your Aspirin medication with the Cardiologists and they would recommend that your stop taking this medicine while you are having bleeding. You should follow up with Dr. Harrington Challenger to discuss this further.

## 2017-01-27 NOTE — Telephone Encounter (Signed)
Dr Carlean Purl you are DOD can you please review?   Pt had colonoscopy on 12/30/16, was referred to hematology for "final pathology, very suggestive of lymphoma". "Further testing to be done on the path. He needs referral to hematology; for B cell non-Hodgkins Lymphoma, involving the colon".   Pt has pain 8/10 that feels like the it's on the "tail bone" with BRB bleeding mixed in the stool. Laying down or walking is the only thing that helps. However, walking is becoming difficult because he is getting weak.    Has tried heating pad and ice pack with no relief.  Can't sleep, no appetite has lost 25-30 pounds since December.  Tylenol or Aleve does not help.  Has been using the canasa suppositories and only has relief for a few hours.  Has fecal incontinence (blood and stool).  He has appt with Dr Thana Farr (oncology) tomorrow at 845 am.

## 2017-01-27 NOTE — ED Triage Notes (Addendum)
Pt is here for rectal bleeding X4 weeks following colonoscopy. He has had bright red blood per rectum as well as pain. He also reports he has lost 30 pounds in the last 2 months.

## 2017-01-27 NOTE — Telephone Encounter (Signed)
The pt has been advised to go to the ED for evaluation.  He states he will call his wife and go this afternoon.  FYI Dr Ardis Hughs

## 2017-01-28 ENCOUNTER — Ambulatory Visit (HOSPITAL_BASED_OUTPATIENT_CLINIC_OR_DEPARTMENT_OTHER): Payer: Medicare Other | Admitting: Oncology

## 2017-01-28 ENCOUNTER — Other Ambulatory Visit (HOSPITAL_COMMUNITY)
Admission: RE | Admit: 2017-01-28 | Discharge: 2017-01-28 | Disposition: A | Discharge status: Home / Self Care | Payer: Medicare Other | Source: Ambulatory Visit | Attending: Oncology | Admitting: Oncology

## 2017-01-28 ENCOUNTER — Other Ambulatory Visit: Payer: Self-pay | Admitting: *Deleted

## 2017-01-28 ENCOUNTER — Other Ambulatory Visit (HOSPITAL_BASED_OUTPATIENT_CLINIC_OR_DEPARTMENT_OTHER): Payer: Medicare Other

## 2017-01-28 VITALS — BP 124/74 | HR 77 | Temp 97.7°F | Resp 18

## 2017-01-28 DIAGNOSIS — C8299 Follicular lymphoma, unspecified, extranodal and solid organ sites: Secondary | ICD-10-CM | POA: Diagnosis not present

## 2017-01-28 DIAGNOSIS — C8200 Follicular lymphoma grade I, unspecified site: Secondary | ICD-10-CM

## 2017-01-28 DIAGNOSIS — D7589 Other specified diseases of blood and blood-forming organs: Secondary | ICD-10-CM | POA: Diagnosis not present

## 2017-01-28 DIAGNOSIS — R52 Pain, unspecified: Secondary | ICD-10-CM

## 2017-01-28 DIAGNOSIS — D720 Genetic anomalies of leukocytes: Secondary | ICD-10-CM | POA: Diagnosis not present

## 2017-01-28 LAB — CBC WITH DIFFERENTIAL/PLATELET
BASO%: 0.4 % (ref 0.0–2.0)
BASOS ABS: 0 10*3/uL (ref 0.0–0.1)
EOS%: 0.9 % (ref 0.0–7.0)
Eosinophils Absolute: 0.1 10*3/uL (ref 0.0–0.5)
HEMATOCRIT: 41.1 % (ref 38.4–49.9)
HGB: 13.7 g/dL (ref 13.0–17.1)
LYMPH#: 1.4 10*3/uL (ref 0.9–3.3)
LYMPH%: 15 % (ref 14.0–49.0)
MCH: 31.1 pg (ref 27.2–33.4)
MCHC: 33.4 g/dL (ref 32.0–36.0)
MCV: 93.3 fL (ref 79.3–98.0)
MONO#: 0.8 10*3/uL (ref 0.1–0.9)
MONO%: 8.4 % (ref 0.0–14.0)
NEUT#: 7 10*3/uL — ABNORMAL HIGH (ref 1.5–6.5)
NEUT%: 75.3 % — AB (ref 39.0–75.0)
Platelets: 255 10*3/uL (ref 140–400)
RBC: 4.4 10*6/uL (ref 4.20–5.82)
RDW: 13.7 % (ref 11.0–14.6)
WBC: 9.3 10*3/uL (ref 4.0–10.3)

## 2017-01-28 MED ORDER — ACETAMINOPHEN 325 MG PO TABS
ORAL_TABLET | ORAL | Status: AC
  Filled 2017-01-28: qty 2

## 2017-01-28 MED ORDER — ACETAMINOPHEN 325 MG PO TABS
650.0000 mg | ORAL_TABLET | Freq: Once | ORAL | Status: AC
  Administered 2017-01-28: 650 mg via ORAL

## 2017-01-28 NOTE — Progress Notes (Signed)
[facility] Follow up:    [place] [id number]   DIAGNOSIS: Cancer Staging No matching staging information was found for the patient.  SUMMARY OF ONCOLOGIC HISTORY:  No history exists.    CURRENT THERAPY:  INTERVAL HISTORY: [name]. 83 y.o. male returns for Scheduled follow-up visit and the bone marrow biopsy.  Patient was recently diagnosed with a follicular lymphoma involving the small intestine as well as a large intestine.   Patient Active Problem List   Diagnosis Date Noted  . Coronary artery disease   . IFG (impaired fasting glucose) 10/03/2015  . PMR (polymyalgia rheumatica) (HCC) 09/07/2015  . Orthostatic hypotension 08/24/2015  . Viral syndrome 08/24/2015  . GERD (gastroesophageal reflux disease) 03/24/2014  . ACP (advance care planning) 11/02/2013  . Left upper arm pain 09/15/2012  . Routine health maintenance 10/29/2011  . HEARING LOSS 11/05/2010  . Chest pain 11/06/2009  . Coronary atherosclerosis 01/16/2009  . SKIN CANCER, RECURRENT 10/13/2007  . Hyperlipidemia 10/13/2007  . Gout 10/13/2007  . Essential hypertension 10/13/2007  . PVC (premature ventricular contraction) 10/13/2007  . PSORIASIS 10/13/2007  . OSTEOARTHRITIS, ANKLE, RIGHT 10/13/2007  . Other acquired absence of organ 10/13/2007    is allergic to niacin.  MEDICAL HISTORY: Past Medical History:  Diagnosis Date  . Coronary artery disease    a. stent (promus) Cx/OM'09  . Fibromyalgia dx'd ~ 04/2015  . GERD (gastroesophageal reflux disease)   . Gout   . HEARING LOSS    "temporary"  . Hyperlipidemia   . HYPERLIPIDEMIA 10/13/2007  . Hypertension   . HYPERTENSION 10/13/2007  . Osteoarthritis    "left shoulder" (08/23/2015)  . Prostatitis, acute   . PROSTATITIS, ACUTE, HX OF 10/13/2007  . PSORIASIS 10/13/2007  . PVC (premature ventricular contraction)    hx  . SKIN CANCER, RECURRENT 10/13/2007     SURGICAL HISTORY: Past Surgical History:  Procedure Laterality Date  . APPENDECTOMY    . CORONARY ANGIOPLASTY WITH STENT PLACEMENT    . CYST EXCISION Right X 2   shoulder  . ELECTROLYSIS OF MISDIRECTED LASHES Bilateral    eyelashes are misdirected and grow inwards   . EYE SURGERY    . MASS EXCISION Right 01/2005   proximal thigh soft tissue mass  . TEAR DUCT PROBING Left   . TYMPANOPLASTY Bilateral    "for hearing loss; put tubes in also, 2X on right, 1X on the left; tubes worked"  . VASECTOMY      SOCIAL HISTORY: Social History   Social History  . Marital status: Married    Spouse name: N/A  . Number of children: 3  . Years of education: N/A   Occupational History  . retired Retired    from [employer].   Social History Main Topics  . Smoking status: Never Smoker  . Smokeless tobacco: Never Used  . Alcohol use No  . Drug use: No  . Sexual activity: No   Other Topics Concern  . Not on file   Social History Narrative   Married 1958   2 daughters- '59, 68, 1 son '61   8 grandchildren   Retired from [employer], works PT as a Curator. Now retired completely (09/2008)   End of life; does not want heroic measures if in a persistent vegative state    FAMILY HISTORY: Family History  Problem Relation Age of Onset  . Heart attack Father 24    died  . Heart  disease Father   . Parkinsonism Mother 40    died  . Breast cancer Sister     died  . Lung cancer      uncle died  . Colon cancer Neg Hx     Review of Systems - Oncology    PHYSICAL EXAMINATION  ECOG PERFORMANCE STATUS: 0 - Asymptomatic  Vitals:   01/28/17 1000 01/28/17 1115  BP: 118/72 124/74  Pulse: 77 77  Resp: 18 18  Temp: 98 F (36.7 C) 97.7 F (36.5 C)    Physical Exam  LABORATORY DATA:  CBC    Component Value Date/Time   WBC 9.3 01/28/2017 1157   WBC 9.1 01/27/2017 1737   RBC 4.40 01/28/2017 1157   RBC 4.33 01/27/2017 1737   HGB 13.7 01/28/2017 1157   HCT 41.1 01/28/2017 1157   PLT  255 01/28/2017 1157   MCV 93.3 01/28/2017 1157   MCH 31.1 01/28/2017 1157   MCH 30.5 01/27/2017 1737   MCHC 33.4 01/28/2017 1157   MCHC 32.5 01/27/2017 1737   RDW 13.7 01/28/2017 1157   LYMPHSABS 1.4 01/28/2017 1157   MONOABS 0.8 01/28/2017 1157   EOSABS 0.1 01/28/2017 1157   BASOSABS 0.0 01/28/2017 1157    CMP     Component Value Date/Time   NA 141 01/27/2017 1737   NA 140 01/24/2017 1115   K 3.6 01/27/2017 1737   K 3.9 01/24/2017 1115   CL 106 01/27/2017 1737   CO2 26 01/27/2017 1737   CO2 26 01/24/2017 1115   GLUCOSE 95 01/27/2017 1737   GLUCOSE 88 01/24/2017 1115   BUN 8 01/27/2017 1737   BUN 16.7 01/24/2017 1115   CREATININE 1.06 01/27/2017 1737   CREATININE 1.2 01/24/2017 1115   CALCIUM 8.3 (L) 01/27/2017 1737   CALCIUM 8.8 01/24/2017 1115   PROT 5.8 (L) 01/27/2017 1737   PROT 6.1 (L) 01/24/2017 1115   ALBUMIN 2.4 (L) 01/27/2017 1737   ALBUMIN 2.6 (L) 01/24/2017 1115   AST 21 01/27/2017 1737   AST 15 01/24/2017 1115   ALT 15 (L) 01/27/2017 1737   ALT 12 01/24/2017 1115   ALKPHOS 65 01/27/2017 1737   ALKPHOS 77 01/24/2017 1115   BILITOT 0.6 01/27/2017 1737   BILITOT 0.44 01/24/2017 1115   GFRNONAA >60 01/27/2017 1737   GFRAA >60 01/27/2017 1737       ASSESSMENT and THERAPY PLAN:     1. Newly diagnosed follicular lymphoma involving the small intestine and large intestine.   Patient has been scheduled for PET scan. Today he has been scheduled for a bone marrow biopsy for further staging workup.  He was explained regarding the benefits and side effects of the bone marrow biopsy.   Procedure note  Bone marrow biopsy:  Informed consent was obtained. Risks and benefits of the biopsy procedure were explained to the patient.  She was asked to lie down on his left lateral side. Left superior iliac spine was identified.  Local anesthesia: To percent lidocaine. 2% lidocaine was infiltrated through the skin, subcutaneous tissue and over the  periosteum.  Aspirate and biopsy was obtained by using Jamshidi needle. Patient has tolerated procedure well. Nipples were sent for routine morphology, cytogenetics and flow cytometry.  It had bandage was applied and the wound. No blood loss was seen after the procedure is done.  Postoperative instructions were given to the patient. Patient was advised to come to the emergency room in case he has any excess bleeding or intolerable pain. Patient was  advised to take Tylenol 650 mg by mouth when necessary every 4 hours as needed for his pain.  Postoperative appointment has been given in the outpatient clinic in 7-10 days. Graft patient was discharged to home in stable condition. His vitals were stable at the time of discharge. Orders Placed This Encounter  Procedures  . CBC with Differential    Standing Status:   Standing    Number of Occurrences:   1    All questions were answered. The patient knows to call the clinic with any problems, questions or concerns. We can certainly see the patient much sooner if necessary. This note was electronically signed. Judge Stall, MD 01/28/2017

## 2017-01-28 NOTE — Progress Notes (Signed)
Bone marrow biopsy tolerated well. No complaints via patient. 1 hour flat time per MD. Monitored patient with no complaints. Bandage clean dry and intact. Discharge instructions given. VSS.   Wylene Simmer, BSN, RN 01/28/2017 11:15 AM

## 2017-01-28 NOTE — Addendum Note (Signed)
Addended by: Ophelia Shoulder R on: 01/28/2017 10:01 AM   Modules accepted: Orders

## 2017-01-28 NOTE — Patient Instructions (Signed)
Bone Marrow Aspiration and Bone Marrow Biopsy, Adult, Care After This sheet gives you information about how to care for yourself after your procedure. Your health care provider may also give you more specific instructions. If you have problems or questions, contact your health care provider. What can I expect after the procedure? After the procedure, it is common to have:  Mild pain and tenderness.  Swelling.  Bruising. Follow these instructions at home:  Take over-the-counter or prescription medicines only as told by your health care provider.  Do not take baths, swim, or use a hot tub until your health care provider approves. Ask if you can take a shower or have a sponge bath.  Follow instructions from your health care provider about how to take care of the puncture site. Make sure you:  Wash your hands with soap and water before you change your bandage (dressing). If soap and water are not available, use hand sanitizer.  Change your dressing as told by your health care provider.  Check your puncture siteevery day for signs of infection. Check for:  More redness, swelling, or pain.  More fluid or blood.  Warmth.  Pus or a bad smell.  Return to your normal activities as told by your health care provider. Ask your health care provider what activities are safe for you.  Do not drive for 24 hours if you were given a medicine to help you relax (sedative).  Keep all follow-up visits as told by your health care provider. This is important. Contact a health care provider if:  You have more redness, swelling, or pain around the puncture site.  You have more fluid or blood coming from the puncture site.  Your puncture site feels warm to the touch.  You have pus or a bad smell coming from the puncture site.  You have a fever.  Your pain is not controlled with medicine. This information is not intended to replace advice given to you by your health care provider. Make sure you  discuss any questions you have with your health care provider. Document Released: 05/03/2005 Document Revised: 05/03/2016 Document Reviewed: 03/27/2016 Elsevier Interactive Patient Education  2017 [employer].

## 2017-01-30 ENCOUNTER — Ambulatory Visit (HOSPITAL_COMMUNITY)
Admission: RE | Admit: 2017-01-30 | Discharge: 2017-01-30 | Disposition: A | Discharge status: Home / Self Care | Payer: Medicare Other | Source: Ambulatory Visit | Attending: Oncology | Admitting: Oncology

## 2017-01-30 DIAGNOSIS — C8298 Follicular lymphoma, unspecified, lymph nodes of multiple sites: Secondary | ICD-10-CM | POA: Insufficient documentation

## 2017-01-30 DIAGNOSIS — Z79899 Other long term (current) drug therapy: Secondary | ICD-10-CM | POA: Diagnosis not present

## 2017-01-30 DIAGNOSIS — C8290 Follicular lymphoma, unspecified, unspecified site: Secondary | ICD-10-CM

## 2017-01-30 LAB — GLUCOSE, CAPILLARY: Glucose-Capillary: 94 mg/dL (ref 65–99)

## 2017-01-30 MED ORDER — FLUDEOXYGLUCOSE F - 18 (FDG) INJECTION
9.3200 | Freq: Once | INTRAVENOUS | Status: AC | PRN
  Administered 2017-01-30: 9.32 via INTRAVENOUS

## 2017-02-03 ENCOUNTER — Observation Stay (HOSPITAL_BASED_OUTPATIENT_CLINIC_OR_DEPARTMENT_OTHER)
Admit: 2017-02-03 | Discharge: 2017-02-03 | Disposition: A | Discharge status: Home / Self Care | Payer: Medicare Other | Attending: Internal Medicine | Admitting: Internal Medicine

## 2017-02-03 ENCOUNTER — Encounter (HOSPITAL_COMMUNITY): Payer: Self-pay

## 2017-02-03 ENCOUNTER — Inpatient Hospital Stay (HOSPITAL_COMMUNITY)
Admission: EM | Admit: 2017-02-03 | Discharge: 2017-02-05 | DRG: 378 | Disposition: A | Discharge status: Home / Self Care | Payer: Medicare Other | Attending: Internal Medicine | Admitting: Internal Medicine

## 2017-02-03 ENCOUNTER — Other Ambulatory Visit: Payer: Self-pay

## 2017-02-03 DIAGNOSIS — K625 Hemorrhage of anus and rectum: Secondary | ICD-10-CM | POA: Diagnosis not present

## 2017-02-03 DIAGNOSIS — K921 Melena: Secondary | ICD-10-CM | POA: Diagnosis not present

## 2017-02-03 DIAGNOSIS — K922 Gastrointestinal hemorrhage, unspecified: Secondary | ICD-10-CM | POA: Diagnosis not present

## 2017-02-03 DIAGNOSIS — Z801 Family history of malignant neoplasm of trachea, bronchus and lung: Secondary | ICD-10-CM

## 2017-02-03 DIAGNOSIS — C8293 Follicular lymphoma, unspecified, intra-abdominal lymph nodes: Secondary | ICD-10-CM | POA: Diagnosis present

## 2017-02-03 DIAGNOSIS — M1990 Unspecified osteoarthritis, unspecified site: Secondary | ICD-10-CM | POA: Diagnosis present

## 2017-02-03 DIAGNOSIS — R531 Weakness: Secondary | ICD-10-CM | POA: Diagnosis not present

## 2017-02-03 DIAGNOSIS — C8299 Follicular lymphoma, unspecified, extranodal and solid organ sites: Secondary | ICD-10-CM | POA: Diagnosis not present

## 2017-02-03 DIAGNOSIS — C8289 Other types of follicular lymphoma, extranodal and solid organ sites: Secondary | ICD-10-CM

## 2017-02-03 DIAGNOSIS — K219 Gastro-esophageal reflux disease without esophagitis: Secondary | ICD-10-CM | POA: Diagnosis not present

## 2017-02-03 DIAGNOSIS — I2510 Atherosclerotic heart disease of native coronary artery without angina pectoris: Secondary | ICD-10-CM | POA: Diagnosis present

## 2017-02-03 DIAGNOSIS — M109 Gout, unspecified: Secondary | ICD-10-CM | POA: Diagnosis present

## 2017-02-03 DIAGNOSIS — Z820 Family history of epilepsy and other diseases of the nervous system: Secondary | ICD-10-CM

## 2017-02-03 DIAGNOSIS — Z85828 Personal history of other malignant neoplasm of skin: Secondary | ICD-10-CM

## 2017-02-03 DIAGNOSIS — R609 Edema, unspecified: Secondary | ICD-10-CM

## 2017-02-03 DIAGNOSIS — Z888 Allergy status to other drugs, medicaments and biological substances status: Secondary | ICD-10-CM

## 2017-02-03 DIAGNOSIS — I10 Essential (primary) hypertension: Secondary | ICD-10-CM | POA: Diagnosis not present

## 2017-02-03 DIAGNOSIS — M797 Fibromyalgia: Secondary | ICD-10-CM | POA: Diagnosis present

## 2017-02-03 DIAGNOSIS — Z955 Presence of coronary angioplasty implant and graft: Secondary | ICD-10-CM

## 2017-02-03 DIAGNOSIS — Z9049 Acquired absence of other specified parts of digestive tract: Secondary | ICD-10-CM

## 2017-02-03 DIAGNOSIS — Z803 Family history of malignant neoplasm of breast: Secondary | ICD-10-CM

## 2017-02-03 DIAGNOSIS — D122 Benign neoplasm of ascending colon: Secondary | ICD-10-CM | POA: Diagnosis present

## 2017-02-03 DIAGNOSIS — Z9889 Other specified postprocedural states: Secondary | ICD-10-CM

## 2017-02-03 DIAGNOSIS — Z79899 Other long term (current) drug therapy: Secondary | ICD-10-CM

## 2017-02-03 DIAGNOSIS — E876 Hypokalemia: Secondary | ICD-10-CM | POA: Diagnosis present

## 2017-02-03 DIAGNOSIS — R1032 Left lower quadrant pain: Secondary | ICD-10-CM | POA: Diagnosis not present

## 2017-02-03 DIAGNOSIS — Z8249 Family history of ischemic heart disease and other diseases of the circulatory system: Secondary | ICD-10-CM

## 2017-02-03 DIAGNOSIS — I959 Hypotension, unspecified: Secondary | ICD-10-CM | POA: Diagnosis present

## 2017-02-03 LAB — CBC
HCT: 40.3 % (ref 39.0–52.0)
HEMOGLOBIN: 13.4 g/dL (ref 13.0–17.0)
MCH: 31.1 pg (ref 26.0–34.0)
MCHC: 33.3 g/dL (ref 30.0–36.0)
MCV: 93.5 fL (ref 78.0–100.0)
Platelets: 313 10*3/uL (ref 150–400)
RBC: 4.31 MIL/uL (ref 4.22–5.81)
RDW: 14.1 % (ref 11.5–15.5)
WBC: 10.4 10*3/uL (ref 4.0–10.5)

## 2017-02-03 LAB — HEMOGLOBIN AND HEMATOCRIT, BLOOD
HCT: 38.9 % — ABNORMAL LOW (ref 39.0–52.0)
HEMATOCRIT: 38.1 % — AB (ref 39.0–52.0)
Hemoglobin: 12.7 g/dL — ABNORMAL LOW (ref 13.0–17.0)
Hemoglobin: 12.5 g/dL — ABNORMAL LOW (ref 13.0–17.0)

## 2017-02-03 LAB — COMPREHENSIVE METABOLIC PANEL
ALBUMIN: 2.3 g/dL — AB (ref 3.5–5.0)
ALT: 12 U/L — AB (ref 17–63)
AST: 20 U/L (ref 15–41)
Alkaline Phosphatase: 57 U/L (ref 38–126)
Anion gap: 7 (ref 5–15)
BILIRUBIN TOTAL: 0.8 mg/dL (ref 0.3–1.2)
BUN: 10 mg/dL (ref 6–20)
CO2: 28 mmol/L (ref 22–32)
CREATININE: 0.93 mg/dL (ref 0.61–1.24)
Calcium: 8.4 mg/dL — ABNORMAL LOW (ref 8.9–10.3)
Chloride: 105 mmol/L (ref 101–111)
GFR calc Af Amer: >60 mL/min (ref >60)
GLUCOSE: 104 mg/dL — AB (ref 65–99)
POTASSIUM: 3.9 mmol/L (ref 3.5–5.1)
Sodium: 140 mmol/L (ref 135–145)
TOTAL PROTEIN: 5.5 g/dL — AB (ref 6.5–8.1)

## 2017-02-03 LAB — POC OCCULT BLOOD, ED: Fecal Occult Bld: POSITIVE — AB

## 2017-02-03 MED ORDER — ONDANSETRON HCL 4 MG/2ML IJ SOLN: 4.0000 mg | Freq: Four times a day (QID) | INTRAMUSCULAR | Status: DC | PRN

## 2017-02-03 MED ORDER — SODIUM CHLORIDE 0.9 % IV SOLN: INTRAVENOUS | Status: DC

## 2017-02-03 MED ORDER — PANTOPRAZOLE SODIUM 40 MG PO TBEC
40.0000 mg | DELAYED_RELEASE_TABLET | Freq: Every day | ORAL | Status: DC
Start: 2017-02-03 — End: 2017-02-05
  Administered 2017-02-03 – 2017-02-05 (×3): 40 mg via ORAL
  Filled 2017-02-03 (×3): qty 1

## 2017-02-03 MED ORDER — TRAMADOL HCL 50 MG PO TABS
25.0000 mg | ORAL_TABLET | Freq: Once | ORAL | Status: DC
  Filled 2017-02-03: qty 1

## 2017-02-03 MED ORDER — HYDROCORTISONE 2.5 % RE CREA
TOPICAL_CREAM | Freq: Three times a day (TID) | RECTAL | Status: DC
  Administered 2017-02-03 – 2017-02-05 (×4): via RECTAL
  Filled 2017-02-03 (×2): qty 28.35

## 2017-02-03 MED ORDER — ACETAMINOPHEN 650 MG RE SUPP: 650.0000 mg | Freq: Four times a day (QID) | RECTAL | Status: DC | PRN

## 2017-02-03 MED ORDER — ACETAMINOPHEN 325 MG PO TABS: 650.0000 mg | ORAL_TABLET | Freq: Four times a day (QID) | ORAL | Status: DC | PRN

## 2017-02-03 MED ORDER — TRAMADOL HCL 50 MG PO TABS: 50.0000 mg | ORAL_TABLET | Freq: Four times a day (QID) | ORAL | Status: DC | PRN

## 2017-02-03 MED ORDER — DIMETHICONE 1 % EX CREA
TOPICAL_CREAM | Freq: Three times a day (TID) | CUTANEOUS | Status: DC
  Administered 2017-02-03 – 2017-02-05 (×6): via TOPICAL
  Filled 2017-02-03: qty 113

## 2017-02-03 MED ORDER — FAMOTIDINE 20 MG PO TABS
20.0000 mg | ORAL_TABLET | Freq: Two times a day (BID) | ORAL | Status: DC
  Administered 2017-02-03 – 2017-02-05 (×5): 20 mg via ORAL
  Filled 2017-02-03 (×5): qty 1

## 2017-02-03 MED ORDER — BARRIER CREAM NON-SPECIFIED: 1.0000 "application " | TOPICAL_CREAM | Freq: Three times a day (TID) | TOPICAL | Status: DC

## 2017-02-03 MED ORDER — FENTANYL CITRATE (PF) 100 MCG/2ML IJ SOLN
50.0000 ug | Freq: Once | INTRAMUSCULAR | Status: AC
  Administered 2017-02-03: 50 ug via INTRAVENOUS
  Filled 2017-02-03: qty 2

## 2017-02-03 MED ORDER — MESALAMINE 1000 MG RE SUPP
1000.0000 mg | Freq: Every day | RECTAL | Status: DC
  Administered 2017-02-04: 1000 mg via RECTAL
  Filled 2017-02-03 (×2): qty 1

## 2017-02-03 MED ORDER — CALCIUM CARBONATE ANTACID 500 MG PO CHEW
1.0000 | CHEWABLE_TABLET | Freq: Three times a day (TID) | ORAL | Status: DC
  Administered 2017-02-03 – 2017-02-05 (×6): 200 mg via ORAL
  Filled 2017-02-03 (×6): qty 1

## 2017-02-03 MED ORDER — HYDROCORTISONE ACETATE 25 MG RE SUPP
25.0000 mg | Freq: Two times a day (BID) | RECTAL | Status: DC
  Administered 2017-02-03 – 2017-02-05 (×4): 25 mg via RECTAL
  Filled 2017-02-03 (×6): qty 1

## 2017-02-03 MED ORDER — ONDANSETRON HCL 4 MG PO TABS: 4.0000 mg | ORAL_TABLET | Freq: Four times a day (QID) | ORAL | Status: DC | PRN

## 2017-02-03 NOTE — ED Notes (Signed)
Bed: [facility]07 Expected date:  Expected time:  Means of arrival: Ambulance Comments: EMS rectal bleeding

## 2017-02-03 NOTE — H&P (Addendum)
History and Physical    [name]. YVO:[id number] DOB: 05-15-1933 DOA: 02/03/2017    PCP: Eulas Post, MD  Patient coming from: home  Chief Complaint: rectal bleeding  HPI: [name]. is a 83 y.o. male with medical history ofCAD with stent GERD with ongoing heartburn, recent diagnosis of  Lymphoma involving the intestine and multiple other groups of lymph nodes who states he woke up this AM around 2 and had multiple episodes of bloody stools, "too many to count". He states he is very weak. His states he has had 2 in the ER that are bloody as well (RN saw one which was not bloody but patient states it was). No abdominal pain but has significant heart burn. Has not been light headed but does feel it after being given the pain shot ( 50 mcg of Fentanyl). His pain was in the rectal area. It is burning due to stools.  Sates he is hungry and wants to eat.  No other complaints.  Seen in the ER on 4/2 for the same issue and discharged home.   ED Course:  BP 113/67, not tachycardic Hb 13.4- at baseline Hemoccult + Hgb unchanged from baseline   Review of Systems:  Weight loss: 25-30 lb since thanksgiving due to poor appetite. All other systems reviewed and apart from HPI, are negative.  Past Medical History:  Diagnosis Date  . Coronary artery disease    a. stent (promus) Cx/OM'09  . Fibromyalgia dx'd ~ 04/2015  . GERD (gastroesophageal reflux disease)   . Gout   . HEARING LOSS    "temporary"  . Hyperlipidemia   . HYPERLIPIDEMIA 10/13/2007  . Hypertension   . HYPERTENSION 10/13/2007  . Osteoarthritis    "left shoulder" (08/23/2015)  . Prostatitis, acute   . PROSTATITIS, ACUTE, HX OF 10/13/2007  . PSORIASIS 10/13/2007  . PVC (premature ventricular contraction)    hx  . SKIN CANCER, RECURRENT 10/13/2007    Past Surgical History:  Procedure Laterality Date  . APPENDECTOMY    . CORONARY ANGIOPLASTY WITH STENT PLACEMENT    . CYST EXCISION Right X 2   shoulder  . ELECTROLYSIS OF MISDIRECTED LASHES Bilateral    eyelashes are misdirected and grow inwards   . EYE SURGERY    . MASS EXCISION Right 01/2005   proximal thigh soft tissue mass  . TEAR DUCT PROBING Left   . TYMPANOPLASTY Bilateral    "for hearing loss; put tubes in also, 2X on right, 1X on the left; tubes worked"  . VASECTOMY      Social History:   reports that he has never smoked. He has never used smokeless tobacco. He reports that he does not drink alcohol or use drugs.  Lives with his wife. Has been relatively immobile for the past 2-3 wks.   Allergies  Allergen Reactions  . Niacin Hives    Family History  Problem Relation Age of Onset  . Heart attack Father 78    died  . Heart disease Father   . Parkinsonism Mother 5    died  . Breast cancer Sister     died  . Lung cancer      uncle died  . Colon cancer Neg Hx      Prior to Admission medications   Medication Sig Start Date End Date Taking? Authorizing Provider  acetaminophen (TYLENOL) 325 MG tablet Take 650 mg by mouth every 6 (six) hours as needed for mild pain, moderate pain, fever or  headache.   Yes Historical Provider, MD  atenolol (TENORMIN) 25 MG tablet TAKE ONE-HALF TABLET BY MOUTH ONCE DAILY. Patient taking differently: Take one-half tablet by mouth at bedtime 10/22/16  Yes Fay Records, MD  lisinopril (PRINIVIL,ZESTRIL) 5 MG tablet Take 1 tablet (5 mg total) by mouth daily. Patient taking differently: Take 5 mg by mouth at bedtime.  09/08/15  Yes Fay Records, MD  mesalamine (CANASA) 1000 MG suppository Place 1 suppository (1,000 mg total) rectally at bedtime. 12/30/16  Yes Milus Banister, MD  nitroGLYCERIN (NITROSTAT) 0.4 MG SL tablet DISSOLVE ONE TABLET UNDER THE TONGUE EVERY 5 MINUTES AS NEEDED Patient taking differently: Place 0.4 mg under the tongue every 5 (five) minutes as needed for chest pain.  09/20/15  Yes Fay Records, MD  rosuvastatin (CRESTOR) 10 MG tablet Take one tablet by mouth  daily prior to bedtime Patient taking differently: Take 10 mg by mouth at bedtime.  08/05/16  Yes Fay Records, MD  triamterene-hydrochlorothiazide (MAXZIDE-25) 37.5-25 MG tablet Take 1 tablet by mouth at bedtime.    Yes Historical Provider, MD  traMADol (ULTRAM) 50 MG tablet Take 2 tablets (100 mg total) by mouth 3 (three) times daily. Patient not taking: Reported on 02/03/2017 01/27/17   Holley Raring, MD    Physical Exam: Vitals:   02/03/17 0912 02/03/17 0914 02/03/17 1126 02/03/17 1127  BP: 113/67  107/61   Pulse: 70  74 70  Resp: 18   11  Temp: 98.5 F (36.9 C)     TempSrc: Oral     SpO2: 99%  98% 98%  Weight:  83.9 kg (185 lb)    Height:  6' (1.829 m)        Constitutional: NAD, calm, comfortable Eyes: PERTLA, lids and conjunctivae normal ENMT: Mucous membranes are moist. Posterior pharynx clear of any exudate or lesions. Normal dentition.  Neck: normal, supple, no masses, no thyromegaly Respiratory: clear to auscultation bilaterally, no wheezing, no crackles. Normal respiratory effort. No accessory muscle use.  Cardiovascular: S1 & S2 heard, regular rate and rhythm, no murmurs / rubs / gallops. Rt leg is swollen-  2+ pedal pulses. No carotid bruits.  Abdomen: No distension, no tenderness, no masses palpated. No hepatosplenomegaly. Bowel sounds normal.  Musculoskeletal: no clubbing / cyanosis. No joint deformity upper and lower extremities. Good ROM, no contractures. Normal muscle tone.  Skin: no rashes, lesions, ulcers. No induration Neurologic: CN 2-12 grossly intact. Sensation intact, DTR normal. Strength 5/5 in all 4 limbs.  Psychiatric: Normal judgment and insight. Alert and oriented x 3. Normal mood.     Labs on Admission: I have personally reviewed following labs and imaging studies  CBC:  Recent Labs Lab 01/27/17 1737 01/28/17 1157 02/03/17 0935  WBC 9.1 9.3 10.4  NEUTROABS  --  7.0*  --   HGB 13.2 13.7 13.4  HCT 40.6 41.1 40.3  MCV 93.8 93.3 93.5  PLT 277  255 914   Basic Metabolic Panel:  Recent Labs Lab 01/27/17 1737 02/03/17 1023  NA 141 140  K 3.6 3.9  CL 106 105  CO2 26 28  GLUCOSE 95 104*  BUN 8 10  CREATININE 1.06 0.93  CALCIUM 8.3* 8.4*   GFR: Estimated Creatinine Clearance: 68.4 mL/min (by C-G formula based on SCr of 0.93 mg/dL). Liver Function Tests:  Recent Labs Lab 01/27/17 1737 02/03/17 1023  AST 21 20  ALT 15* 12*  ALKPHOS 65 57  BILITOT 0.6 0.8  PROT 5.8* 5.5*  ALBUMIN 2.4* 2.3*   No results for input(s): LIPASE, AMYLASE in the last 168 hours. No results for input(s): AMMONIA in the last 168 hours. Coagulation Profile: No results for input(s): INR, PROTIME in the last 168 hours. Cardiac Enzymes: No results for input(s): CKTOTAL, CKMB, CKMBINDEX, TROPONINI in the last 168 hours. BNP (last 3 results) No results for input(s): PROBNP in the last 8760 hours. HbA1C: No results for input(s): HGBA1C in the last 72 hours. CBG:  Recent Labs Lab 01/30/17 1246  GLUCAP 94   Lipid Profile: No results for input(s): CHOL, HDL, LDLCALC, TRIG, CHOLHDL, LDLDIRECT in the last 72 hours. Thyroid Function Tests: No results for input(s): TSH, T4TOTAL, FREET4, T3FREE, THYROIDAB in the last 72 hours. Anemia Panel: No results for input(s): VITAMINB12, FOLATE, FERRITIN, TIBC, IRON, RETICCTPCT in the last 72 hours. Urine analysis:    Component Value Date/Time   COLORURINE AMBER (A) 08/23/2015 1302   APPEARANCEUR CLEAR 08/23/2015 1302   LABSPEC 1.019 08/23/2015 1302   PHURINE 7.5 08/23/2015 1302   GLUCOSEU NEGATIVE 08/23/2015 1302   HGBUR NEGATIVE 08/23/2015 1302   BILIRUBINUR SMALL (A) 08/23/2015 1302   KETONESUR NEGATIVE 08/23/2015 1302   PROTEINUR NEGATIVE 08/23/2015 1302   UROBILINOGEN 1.0 08/23/2015 1302   NITRITE NEGATIVE 08/23/2015 1302   LEUKOCYTESUR NEGATIVE 08/23/2015 1302   Sepsis Labs: _0 (procalcitonin:4,lacticidven:4) )No results found for this or any previous visit (from the past 240  hour(s)).   Radiological Exams on Admission: No results found.  EKG: Independently reviewed. Sinus rhythm with LAD and RBBB  Assessment/Plan Principal Problem:   GI bleed   - colonoscopy 12/30/16- patchy inflammation including terminal ileum and colon- 2 polyps found - his wife states that he does have a hemorrhoid and had some blood on his underwear and thigh today- start Anusol - per RN the stool today was not bloody and at the same time the patient claimed it was and therefore he may be inadvertantly giving a misleading history - Hb has not dropped - clear liquid diet - follow for further bleeding- if none, can be discharged tomorrow - I am not sure why he is on [place] but will resume  Active Problems:  Recent diagnosis of 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  - bone marrow biopsy on 4/3 by oncology- result pending - PET lighting up in mediastinal, hilar, L neck and RLQ - following with oncology  Rt leg edema - has been sedentary- check Venous duplex  GERD - start Protonix, Pepcid and Tums  Generalized weakness - PT eval  Weight loss - add supplements when diet advanced    Hypotension with h/o Essential hypertension - hold Atenolol, Triamterine and HCTZ- may not need these any longer    Coronary atherosclerosis - with stent - Aspirin stopped a couple of weeks ago  DVT prophylaxis: SCDs Code Status:  Full code Family Communication: wife and daughter  Disposition Plan: home tomorrow if stable  Consults called: GI called by ER - Dr Ardis Hughs states that, at this time, he does not need to see the patient and I agree.  Admission status: observation    Debbe Odea MD [facility] Pager: [email or web address] Password [email or web address] 7PM-7AM, please contact night-coverage   02/03/2017, 11:56 AM

## 2017-02-03 NOTE — ED Notes (Signed)
GI called; may not be able to see pt until he arrives on the floor.

## 2017-02-03 NOTE — Progress Notes (Signed)
**  Preliminary report by tech**  Right lower extremity venous duplex complete. There is no evidence of deep or superficial vein thrombosis involving the right lower extremity. All visualized vessels appear patent and compressible. There is no evidence of a Baker's cyst on the left.  02/03/17 4:16 PM [name] RVT

## 2017-02-03 NOTE — ED Provider Notes (Signed)
[place] DEPT Provider Note   CSN: [id number] Arrival date & time: 02/03/17  0910     History   Chief Complaint Chief Complaint  Patient presents with  . Rectal Bleeding    HPI [name]. is a 83 y.o. male.  The history is provided by the patient.  Rectal Bleeding  Quality:  Bright red and maroon Amount:  Moderate Duration: several months. Timing:  Intermittent Chronicity:  Recurrent Context: hemorrhoids   Context comment:  Recently diagnosed with Lymphoma with colon infiltration on recent colonoscopy. has been bleeding since colonoscopy Similar prior episodes: yes   Relieved by:  Nothing Worsened by:  Nothing Associated symptoms comment:  Rectal pain    Past Medical History:  Diagnosis Date  . Coronary artery disease    a. stent (promus) Cx/OM'09  . Fibromyalgia dx'd ~ 04/2015  . GERD (gastroesophageal reflux disease)   . Gout   . HEARING LOSS    "temporary"  . Hyperlipidemia   . HYPERLIPIDEMIA 10/13/2007  . Hypertension   . HYPERTENSION 10/13/2007  . Osteoarthritis    "left shoulder" (08/23/2015)  . Prostatitis, acute   . PROSTATITIS, ACUTE, HX OF 10/13/2007  . PSORIASIS 10/13/2007  . PVC (premature ventricular contraction)    hx  . SKIN CANCER, RECURRENT 10/13/2007    Patient Active Problem List   Diagnosis Date Noted  . 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 GI bleed 02/03/2017  . Lower GI bleed 02/03/2017  . Coronary artery disease   . IFG (impaired fasting glucose) 10/03/2015  . PMR (polymyalgia rheumatica) (HCC) 09/07/2015  . Orthostatic hypotension 08/24/2015  . Viral syndrome 08/24/2015  . GERD (gastroesophageal reflux disease) 03/24/2014  . ACP (advance care planning) 11/02/2013  . Left upper arm pain 09/15/2012  . Routine health maintenance 10/29/2011  . HEARING LOSS 11/05/2010  . Chest pain 11/06/2009  . Coronary atherosclerosis 01/16/2009  . SKIN CANCER, RECURRENT 10/13/2007    . Hyperlipidemia 10/13/2007  . Gout 10/13/2007  . Essential hypertension 10/13/2007  . PVC (premature ventricular contraction) 10/13/2007  . PSORIASIS 10/13/2007  . OSTEOARTHRITIS, ANKLE, RIGHT 10/13/2007  . Other acquired absence of organ 10/13/2007    Past Surgical History:  Procedure Laterality Date  . APPENDECTOMY    . CORONARY ANGIOPLASTY WITH STENT PLACEMENT    . CYST EXCISION Right X 2   shoulder  . ELECTROLYSIS OF MISDIRECTED LASHES Bilateral    eyelashes are misdirected and grow inwards   . EYE SURGERY    . MASS EXCISION Right 01/2005   proximal thigh soft tissue mass  . TEAR DUCT PROBING Left   . TYMPANOPLASTY Bilateral    "for hearing loss; put tubes in also, 2X on right, 1X on the left; tubes worked"  . VASECTOMY         Home Medications    Prior to Admission medications   Medication Sig Start Date End Date Taking? Authorizing Provider  acetaminophen (TYLENOL) 325 MG tablet Take 650 mg by mouth every 6 (six) hours as needed for mild pain, moderate pain, fever or headache.   Yes Historical Provider, MD  atenolol (TENORMIN) 25 MG tablet TAKE ONE-HALF TABLET BY MOUTH ONCE DAILY. Patient taking differently: Take one-half tablet by mouth at bedtime 10/22/16  Yes Fay Records, MD  lisinopril (PRINIVIL,ZESTRIL) 5 MG tablet Take 1 tablet (5 mg total) by mouth daily. Patient taking differently: Take 5 mg by mouth at bedtime.  09/08/15  Yes Fay Records, MD  mesalamine (CANASA) 1000 MG suppository Place 1 suppository (1,000 mg total) rectally at bedtime. 12/30/16  Yes Milus Banister, MD  nitroGLYCERIN (NITROSTAT) 0.4 MG SL tablet DISSOLVE ONE TABLET UNDER THE TONGUE EVERY 5 MINUTES AS NEEDED Patient taking differently: Place 0.4 mg under the tongue every 5 (five) minutes as needed for chest pain.  09/20/15  Yes Fay Records, MD  rosuvastatin (CRESTOR) 10 MG tablet Take one tablet by mouth daily prior to bedtime Patient taking differently: Take 10 mg by mouth at bedtime.   08/05/16  Yes Fay Records, MD  triamterene-hydrochlorothiazide (MAXZIDE-25) 37.5-25 MG tablet Take 1 tablet by mouth at bedtime.    Yes Historical Provider, MD  traMADol (ULTRAM) 50 MG tablet Take 2 tablets (100 mg total) by mouth 3 (three) times daily. Patient not taking: Reported on 02/03/2017 01/27/17   Holley Raring, MD    Family History Family History  Problem Relation Age of Onset  . Heart attack Father 22    died  . Heart disease Father   . Parkinsonism Mother 65    died  . Breast cancer Sister     died  . Lung cancer      uncle died  . Colon cancer Neg Hx     Social History Social History  Substance Use Topics  . Smoking status: Never Smoker  . Smokeless tobacco: Never Used  . Alcohol use No     Allergies   Niacin   Review of Systems Review of Systems  Gastrointestinal: Positive for hematochezia.  All other systems are reviewed and are negative for acute change except as noted in the HPI    Physical Exam Updated Vital Signs BP 113/67 (BP Location: Left Arm)   Pulse 70   Temp 98.5 F (36.9 C) (Oral)   Resp 18   Ht 6' (1.829 m)   Wt 185 lb (83.9 kg)   SpO2 99%   BMI 25.09 kg/m   Physical Exam  Constitutional: He is oriented to person, place, and time. He appears well-developed and well-nourished. No distress.  HENT:  Head: Normocephalic and atraumatic.  Nose: Nose normal.  Eyes: Conjunctivae and EOM are normal. Pupils are equal, round, and reactive to light. Right eye exhibits no discharge. Left eye exhibits no discharge. No scleral icterus.  Neck: Normal range of motion. Neck supple.  Cardiovascular: Normal rate and regular rhythm.  Exam reveals no gallop and no friction rub.   No murmur heard. Pulmonary/Chest: Effort normal and breath sounds normal. No stridor. No respiratory distress. He has no rales.  Abdominal: Soft. He exhibits no distension. There is no tenderness.  Genitourinary:  Genitourinary Comments: Streaky red stool  Musculoskeletal:  He exhibits no edema or tenderness.  Neurological: He is alert and oriented to person, place, and time.  Skin: Skin is warm and dry. No rash noted. He is not diaphoretic. No erythema.  Psychiatric: He has a normal mood and affect.  Vitals reviewed.    ED Treatments / Results  Labs (all labs ordered are listed, but only abnormal results are displayed) Labs Reviewed  COMPREHENSIVE METABOLIC PANEL - Abnormal; Notable for the following:       Result Value   Glucose, Bld 104 (*)    Calcium 8.4 (*)    Total Protein 5.5 (*)    Albumin 2.3 (*)    ALT 12 (*)    All other components within normal limits  POC OCCULT BLOOD, ED - Abnormal; Notable for the following:  Fecal Occult Bld POSITIVE (*)    All other components within normal limits  CBC  HEMOGLOBIN AND HEMATOCRIT, BLOOD  HEMOGLOBIN AND HEMATOCRIT, BLOOD    EKG  EKG Interpretation None       Radiology No results found.  Procedures Procedures (including critical care time)  Medications Ordered in ED Medications  traMADol (ULTRAM) tablet 50 mg (not administered)  0.9 %  sodium chloride infusion (not administered)  acetaminophen (TYLENOL) tablet 650 mg (not administered)    Or  acetaminophen (TYLENOL) suppository 650 mg (not administered)  ondansetron (ZOFRAN) tablet 4 mg (not administered)    Or  ondansetron (ZOFRAN) injection 4 mg (not administered)  dimethicone 1 % cream (not administered)  famotidine (PEPCID) tablet 20 mg (not administered)  pantoprazole (PROTONIX) EC tablet 40 mg (not administered)  calcium carbonate (TUMS - dosed in mg elemental calcium) chewable tablet 200 mg of elemental calcium (not administered)  fentaNYL (SUBLIMAZE) injection 50 mcg (50 mcg Intravenous Given 02/03/17 1121)     Initial Impression / Assessment and Plan / ED Course  I have reviewed the triage vital signs and the nursing notes.  Pertinent labs & imaging results that were available during my care of the patient were  reviewed by me and considered in my medical decision making (see chart for details).     Active lower GI bleed in a patient with history of lymphoma and recent colonoscopy with biopsies noted ulceration and inflammation of the distal colon due to the lymphoma. Patient is not on any anticoagulation currently. Reports fatigue. Denies any chest pain or shortness of breath. Currently hemoglobin at baseline and he is HDS. Abdomen benign. Discussed case with gastroenterology who will see the patient in the morning for further workup and management. Patient will be admitted to the hospitalist service for continued management.  Final Clinical Impressions(s) / ED Diagnoses   Final diagnoses:  Lower GI bleed     Fatima Blank, MD 02/03/17 1304

## 2017-02-03 NOTE — ED Triage Notes (Signed)
Pt c/o rectal bleeding that woke him at about 0300 today. Pt states he has had rectal bleeding intermittently for months now, however the cause is still unknown. Pt arrived alert and oriented x4. Positive orthostatics per EMS. 20g IV right ac placed prior to arrival.

## 2017-02-03 NOTE — ED Notes (Signed)
Attempt made to call report to floor. Nurse unavailable to take report at this time.

## 2017-02-04 DIAGNOSIS — M1990 Unspecified osteoarthritis, unspecified site: Secondary | ICD-10-CM | POA: Diagnosis present

## 2017-02-04 DIAGNOSIS — K921 Melena: Secondary | ICD-10-CM | POA: Diagnosis present

## 2017-02-04 DIAGNOSIS — Z803 Family history of malignant neoplasm of breast: Secondary | ICD-10-CM | POA: Diagnosis not present

## 2017-02-04 DIAGNOSIS — M797 Fibromyalgia: Secondary | ICD-10-CM | POA: Diagnosis present

## 2017-02-04 DIAGNOSIS — K922 Gastrointestinal hemorrhage, unspecified: Secondary | ICD-10-CM | POA: Diagnosis not present

## 2017-02-04 DIAGNOSIS — Z801 Family history of malignant neoplasm of trachea, bronchus and lung: Secondary | ICD-10-CM | POA: Diagnosis not present

## 2017-02-04 DIAGNOSIS — M109 Gout, unspecified: Secondary | ICD-10-CM | POA: Diagnosis present

## 2017-02-04 DIAGNOSIS — Z79899 Other long term (current) drug therapy: Secondary | ICD-10-CM | POA: Diagnosis not present

## 2017-02-04 DIAGNOSIS — D122 Benign neoplasm of ascending colon: Secondary | ICD-10-CM | POA: Diagnosis present

## 2017-02-04 DIAGNOSIS — I2510 Atherosclerotic heart disease of native coronary artery without angina pectoris: Secondary | ICD-10-CM | POA: Diagnosis present

## 2017-02-04 DIAGNOSIS — K625 Hemorrhage of anus and rectum: Secondary | ICD-10-CM

## 2017-02-04 DIAGNOSIS — Z820 Family history of epilepsy and other diseases of the nervous system: Secondary | ICD-10-CM | POA: Diagnosis not present

## 2017-02-04 DIAGNOSIS — I959 Hypotension, unspecified: Secondary | ICD-10-CM | POA: Diagnosis present

## 2017-02-04 DIAGNOSIS — C8299 Follicular lymphoma, unspecified, extranodal and solid organ sites: Secondary | ICD-10-CM | POA: Diagnosis present

## 2017-02-04 DIAGNOSIS — C8289 Other types of follicular lymphoma, extranodal and solid organ sites: Secondary | ICD-10-CM | POA: Diagnosis not present

## 2017-02-04 DIAGNOSIS — Z85828 Personal history of other malignant neoplasm of skin: Secondary | ICD-10-CM | POA: Diagnosis not present

## 2017-02-04 DIAGNOSIS — Z955 Presence of coronary angioplasty implant and graft: Secondary | ICD-10-CM | POA: Diagnosis not present

## 2017-02-04 DIAGNOSIS — E876 Hypokalemia: Secondary | ICD-10-CM | POA: Diagnosis not present

## 2017-02-04 DIAGNOSIS — I10 Essential (primary) hypertension: Secondary | ICD-10-CM | POA: Diagnosis present

## 2017-02-04 DIAGNOSIS — Z9889 Other specified postprocedural states: Secondary | ICD-10-CM | POA: Diagnosis not present

## 2017-02-04 DIAGNOSIS — Z8249 Family history of ischemic heart disease and other diseases of the circulatory system: Secondary | ICD-10-CM | POA: Diagnosis not present

## 2017-02-04 DIAGNOSIS — C8293 Follicular lymphoma, unspecified, intra-abdominal lymph nodes: Secondary | ICD-10-CM | POA: Diagnosis present

## 2017-02-04 DIAGNOSIS — Z9049 Acquired absence of other specified parts of digestive tract: Secondary | ICD-10-CM | POA: Diagnosis not present

## 2017-02-04 DIAGNOSIS — Z888 Allergy status to other drugs, medicaments and biological substances status: Secondary | ICD-10-CM | POA: Diagnosis not present

## 2017-02-04 DIAGNOSIS — K219 Gastro-esophageal reflux disease without esophagitis: Secondary | ICD-10-CM | POA: Diagnosis present

## 2017-02-04 LAB — BASIC METABOLIC PANEL
ANION GAP: 5 (ref 5–15)
BUN: 8 mg/dL (ref 6–20)
CHLORIDE: 106 mmol/L (ref 101–111)
CO2: 28 mmol/L (ref 22–32)
Calcium: 8.1 mg/dL — ABNORMAL LOW (ref 8.9–10.3)
Creatinine, Ser: 0.86 mg/dL (ref 0.61–1.24)
GFR calc non Af Amer: >60 mL/min (ref >60)
Glucose, Bld: 108 mg/dL — ABNORMAL HIGH (ref 65–99)
Potassium: 3.4 mmol/L — ABNORMAL LOW (ref 3.5–5.1)
Sodium: 139 mmol/L (ref 135–145)

## 2017-02-04 LAB — CBC
HEMATOCRIT: 38 % — AB (ref 39.0–52.0)
HEMOGLOBIN: 12.5 g/dL — AB (ref 13.0–17.0)
MCH: 30.6 pg (ref 26.0–34.0)
MCHC: 32.9 g/dL (ref 30.0–36.0)
MCV: 93.1 fL (ref 78.0–100.0)
Platelets: 306 10*3/uL (ref 150–400)
RBC: 4.08 MIL/uL — AB (ref 4.22–5.81)
RDW: 14.2 % (ref 11.5–15.5)
WBC: 9.8 10*3/uL (ref 4.0–10.5)

## 2017-02-04 LAB — HEMOGLOBIN AND HEMATOCRIT, BLOOD
HCT: 35.6 % — ABNORMAL LOW (ref 39.0–52.0)
HEMATOCRIT: 36.6 % — AB (ref 39.0–52.0)
Hemoglobin: 11.8 g/dL — ABNORMAL LOW (ref 13.0–17.0)
Hemoglobin: 12.1 g/dL — ABNORMAL LOW (ref 13.0–17.0)

## 2017-02-04 MED ORDER — POTASSIUM CHLORIDE CRYS ER 20 MEQ PO TBCR
40.0000 meq | EXTENDED_RELEASE_TABLET | Freq: Once | ORAL | Status: AC
  Administered 2017-02-04: 40 meq via ORAL
  Filled 2017-02-04: qty 2

## 2017-02-04 MED ORDER — SODIUM CHLORIDE 0.9 % IV SOLN
INTRAVENOUS | Status: DC
  Administered 2017-02-04: 14:00:00 via INTRAVENOUS

## 2017-02-04 NOTE — Progress Notes (Signed)
PT Note  Patient Details Name: [name]. MRN: [id number] DOB: 1932/11/15  PT order received and pt screened for needs.   Pt ambulated 87' with good safety awareness and occasional mild instability but self corrected and with no LOB.  Pt able to step fwd, bkwd, sideways and tandem step unassisted and with no LOB.    Pt c/o "just feeling weak" and states has been ambulating in halls with staff and spouse and is close to baseline level of function.  Pt services to sign off at this time.   Bexley Laubach 02/04/2017, 2:59 PM

## 2017-02-04 NOTE — Progress Notes (Signed)
Patient ID: Jacob Bowens., male   DOB: 01/06/1933, 83 y.o.   MRN: [id number]  PROGRESS NOTE    Jacob Bowens.  XKG:[id number] DOB: 1933-10-04 DOA: 02/03/2017  PCP: Eulas Post, MD   Brief Narrative:  83 y.o. male with medical history significant for CAD with stent placement, GERD, recent diagnosis of lymphoma involving the intestine and lymph nodes. He presented to ED with bloody stools started the morning prior to the admission. He reported generalized weakness, fatigue, poor appetite. He was seen in ED on 4/2 for same issue and discharged home.  Patient was hemodynamically stable on the admission. His hemoglobin was at baseline, 12.7. FOBT was positive. GI consulted. His recent colonoscopy was 12/30/2016 which showed Ileocolitis, multiple biopsies taken. Also to typical appearing polyps in the ascending colon found, sent for pathology.  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 Carrington Clamp  Active Problems:   Acute lower GI bleed / history of intermittent rectal bleeding - Has had recent colonoscopy 12/30/2016 which showed Ileocolitis, multiple biopsies taken. Also to typical appearing polyps in the ascending colon found, sent for pathology. For terminal ileum small bowel mucosa was with atypical lymphoid infiltrate, no dysplasia. For ascending colon, the polyp was tubular adenoma without high-grade dysplasia - Hemoglobin remains stable but patient reports having streaks of blood with bowel movement - Appreciate GI consult recommendation - Continue Protonix and Pepcid    Hypokalemia - Supplemented this morning   DVT prophylaxis: SCDs bilaterally Code Status: full code  Family Communication: No family at the bedside Disposition Plan: Home once cleared by GI   Consultants:   GI  Procedures:   None  Antimicrobials:   None    Subjective: Reported blood with  BM.  Objective: Vitals:   02/03/17 1300 02/03/17 1400 02/03/17 2117 02/04/17 0433  BP: (!) 114/58  115/70 (!) 110/58  Pulse: 69  89 90  Resp: 18  20 18   Temp: 97 F (36.1 C)  98.7 F (37.1 C) 98.4 F (36.9 C)  TempSrc: Oral  Oral Oral  SpO2: 100%  99% 100%  Weight:  83.9 kg (185 lb)    Height:  6' (1.829 m)      Intake/Output Summary (Last 24 hours) at 02/04/17 1113 Last data filed at 02/04/17 0900  Gross per 24 hour  Intake              880 ml  Output                0 ml  Net              880 ml   Filed Weights   02/03/17 0914 02/03/17 1400  Weight: 83.9 kg (185 lb) 83.9 kg (185 lb)    Examination:  General exam: Appears calm and comfortable  Respiratory system: Clear to auscultation. Respiratory effort normal. Cardiovascular system: S1 & S2 heard, RRR. No pedal edema. Gastrointestinal system: Abdomen is nondistended, soft and nontender. No organomegaly or masses felt. Normal bowel sounds heard. Central nervous system: Alert and oriented. No focal neurological deficits. Extremities: Symmetric 5 x 5 power. Skin: No rashes, lesions or ulcers Psychiatry: Judgement and insight appear normal. Mood & affect appropriate.   Data Reviewed: I have personally reviewed following labs and imaging studies  CBC:  Recent Labs Lab 01/28/17 1157 02/03/17 0935 02/03/17 1343 02/03/17 1748 02/04/17 0405  WBC 9.3 10.4  --   --  9.8  NEUTROABS 7.0*  --   --   --   --   HGB 13.7 13.4 12.7* 12.5* 12.5*  HCT 41.1 40.3 38.9* 38.1* 38.0*  MCV 93.3 93.5  --   --  93.1  PLT 255 313  --   --  060   Basic Metabolic Panel:  Recent Labs Lab 02/03/17 1023 02/04/17 0405  NA 140 139  K 3.9 3.4*  CL 105 106  CO2 28 28  GLUCOSE 104* 108*  BUN 10 8  CREATININE 0.93 0.86  CALCIUM 8.4* 8.1*   GFR: Estimated Creatinine Clearance: 73.9 mL/min (by C-G formula based on SCr of 0.86 mg/dL). Liver Function Tests:  Recent Labs Lab 02/03/17 1023  AST 20  ALT 12*  ALKPHOS 57  BILITOT  0.8  PROT 5.5*  ALBUMIN 2.3*   No results for input(s): LIPASE, AMYLASE in the last 168 hours. No results for input(s): AMMONIA in the last 168 hours. Coagulation Profile: No results for input(s): INR, PROTIME in the last 168 hours. Cardiac Enzymes: No results for input(s): CKTOTAL, CKMB, CKMBINDEX, TROPONINI in the last 168 hours. BNP (last 3 results) No results for input(s): PROBNP in the last 8760 hours. HbA1C: No results for input(s): HGBA1C in the last 72 hours. CBG:  Recent Labs Lab 01/30/17 1246  GLUCAP 94   Lipid Profile: No results for input(s): CHOL, HDL, LDLCALC, TRIG, CHOLHDL, LDLDIRECT in the last 72 hours. Thyroid Function Tests: No results for input(s): TSH, T4TOTAL, FREET4, T3FREE, THYROIDAB in the last 72 hours. Anemia Panel: No results for input(s): VITAMINB12, FOLATE, FERRITIN, TIBC, IRON, RETICCTPCT in the last 72 hours. Urine analysis:  Sepsis Labs: @LABRCNTIP (procalcitonin:4,lacticidven:4)   )No results found for this or any previous visit (from the past 240 hour(s)).    Radiology Studies: No results found.   Scheduled Meds: . calcium carbonate  1 tablet Oral TID WC  . dimethicone   Topical TID  . famotidine  20 mg Oral BID  . hydrocortisone   Rectal TID  . hydrocortisone  25 mg Rectal BID  . mesalamine  1,000 mg Rectal QHS  . pantoprazole  40 mg Oral Daily   Continuous Infusions:   LOS: 1 day    Time spent: 25 minutes  Greater than 50% of the time spent on counseling and coordinating the care.   Leisa Lenz, MD [facility] Pager [phone]  If 7PM-7AM, please contact night-coverage [email or web address] Password [email or web address] 02/04/2017, 11:13 AM

## 2017-02-04 NOTE — Progress Notes (Signed)
Initial Nutrition Assessment  INTERVENTION:   Once diet advanced past clears, provide Boost Plus chocolate BID mixed with ice cream- Each supplement provides 360kcal and 14g protein.   Encourage PO of liquid as tolerated RD to continue to monitor  NUTRITION DIAGNOSIS:   Increased nutrient needs related to cancer and cancer related treatments as evidenced by estimated needs.  GOAL:   Patient will meet greater than or equal to 90% of their needs  MONITOR:   PO intake, Supplement acceptance, Labs, Weight trends, I & O's  REASON FOR ASSESSMENT:   Malnutrition Screening Tool    ASSESSMENT:   83 y.o. male with medical history ofCAD with stent GERD with ongoing heartburn, recent diagnosis of  Lymphoma involving the intestine and multiple other groups of lymph nodes who states he woke up this AM around 2 and had multiple episodes of bloody stools, "too many to count". He states he is very weak. His states he has had 2 in the ER that are bloody as well (RN saw one which was not bloody but patient states it was). No abdominal pain but has significant heart burn. Has not been light headed but does feel it after being given the pain shot ( 50 mcg of Fentanyl). His pain was in the rectal area. It is burning due to stools.  Sates he is hungry and wants to eat.  No other complaints.  Patient in room with no family at bedside. Pt states he has had poor appetite for over a month now but states he feels ready for solid food at this time. States he would like his dinner meal to be the exact meal he had for lunch. Pt states he is not interested in [employer] supplements but likes Boost vanilla mixed with ice cream. RD stated that we have only provide chocolate Boost and pt was okay with this.   Per pt, UBW is 215-216 lb. Per chart, pt has lost 22 lb since 09/27/16 (11% wt loss x 4 months, significant for time frame). Nutrition focused physical exam shows no sign of depletion of muscle mass or body fat.  Pt states he usually walks ~30 minutes a day to keep his strength and has not noticed any changes in his ability to walk.   Medications: Protonix tablet daily  Labs reviewed: Low K   Diet Order:  Diet clear liquid Room service appropriate? Yes; Fluid consistency: Thin  Skin:  Reviewed, no issues  Last BM:  4/10  Height:   Ht Readings from Last 1 Encounters:  02/03/17 6' (1.829 m)    Weight:   Wt Readings from Last 1 Encounters:  02/03/17 185 lb (83.9 kg)    Ideal Body Weight:  80.9 kg  BMI:  Body mass index is 25.09 kg/m.  Estimated Nutritional Needs:   Kcal:  2100-2300  Protein:  100-110g  Fluid:  2.1L/day  EDUCATION NEEDS:   No education needs identified at this time  Clayton Bibles, MS, RD, LDN Pager: [phone] After Hours Pager: [phone]

## 2017-02-05 LAB — BASIC METABOLIC PANEL
ANION GAP: 4 — AB (ref 5–15)
BUN: 7 mg/dL (ref 6–20)
CHLORIDE: 106 mmol/L (ref 101–111)
CO2: 29 mmol/L (ref 22–32)
Calcium: 8 mg/dL — ABNORMAL LOW (ref 8.9–10.3)
Creatinine, Ser: 0.89 mg/dL (ref 0.61–1.24)
GFR calc Af Amer: >60 mL/min (ref >60)
GFR calc non Af Amer: >60 mL/min (ref >60)
Glucose, Bld: 109 mg/dL — ABNORMAL HIGH (ref 65–99)
Potassium: 4 mmol/L (ref 3.5–5.1)
SODIUM: 139 mmol/L (ref 135–145)

## 2017-02-05 LAB — CBC
HCT: 36.8 % — ABNORMAL LOW (ref 39.0–52.0)
HEMOGLOBIN: 12.1 g/dL — AB (ref 13.0–17.0)
MCH: 30.9 pg (ref 26.0–34.0)
MCHC: 32.9 g/dL (ref 30.0–36.0)
MCV: 94.1 fL (ref 78.0–100.0)
PLATELETS: 276 10*3/uL (ref 150–400)
RBC: 3.91 MIL/uL — ABNORMAL LOW (ref 4.22–5.81)
RDW: 14.3 % (ref 11.5–15.5)
WBC: 9.1 10*3/uL (ref 4.0–10.5)

## 2017-02-05 MED ORDER — HYDROCORTISONE ACETATE 25 MG RE SUPP: 25.0000 mg | Freq: Two times a day (BID) | RECTAL | 0 refills | Status: DC

## 2017-02-05 MED ORDER — FAMOTIDINE 20 MG PO TABS: 20.0000 mg | ORAL_TABLET | Freq: Two times a day (BID) | ORAL | 0 refills | Status: DC

## 2017-02-05 MED ORDER — ACETAMINOPHEN 325 MG PO TABS
650.0000 mg | ORAL_TABLET | Freq: Once | ORAL | Status: AC
  Administered 2017-02-05: 650 mg via ORAL
  Filled 2017-02-05: qty 2

## 2017-02-05 MED ORDER — BOOST PLUS PO LIQD
237.0000 mL | Freq: Three times a day (TID) | ORAL | Status: DC
  Filled 2017-02-05 (×2): qty 237

## 2017-02-05 MED ORDER — PANTOPRAZOLE SODIUM 40 MG PO TBEC: 40.0000 mg | DELAYED_RELEASE_TABLET | Freq: Every day | ORAL | 0 refills | Status: DC

## 2017-02-05 MED ORDER — HYDROCORTISONE 2.5 % RE CREA: TOPICAL_CREAM | Freq: Three times a day (TID) | RECTAL | 0 refills | Status: DC

## 2017-02-05 MED ORDER — BOOST PLUS PO LIQD: 237.0000 mL | Freq: Three times a day (TID) | ORAL | 0 refills | Status: DC

## 2017-02-05 NOTE — Discharge Summary (Signed)
Physician Discharge Summary  [name]. OEV:[id number] DOB: 06-17-1933 DOA: 02/03/2017  PCP: Eulas Post, MD  Admit date: 02/03/2017 Discharge date: 02/05/2017  Recommendations for Outpatient Follow-up:  1. Follow up with GI per sch appt 2. 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 He presented to ED with bloody stools started the morning prior to the admission. He reported generalized weakness, fatigue, poor appetite. He was seen in ED on 4/2 for same issue and discharged home.  Patient was hemodynamically stable on the admission. His hemoglobin was at baseline, 12.7. FOBT was positive. GI consulted. His recent colonoscopy was 12/30/2016 which showed Ileocolitis, multiple biopsies taken. Also to typical appearing polyps in the ascending colon found, sent for pathology.  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 Carrington Clamp  Active Problems:   Acute lower GI bleed / history of intermittent rectal bleeding - Has had recent colonoscopy 12/30/2016 which showed Ileocolitis, multiple biopsies taken. Also to typical appearing polyps in the ascending colon found, sent for pathology. For terminal ileum small bowel mucosa was with atypical lymphoid infiltrate, no dysplasia. For ascending colon, the polyp was tubular adenoma without high-grade dysplasia - Hemoglobin remains stable - No specific recommendations per GI - Continue Pepcid and protonix      Hypokalemia - Supplemented this morning   DVT prophylaxis: SCDs bilaterally Code Status: full code  Family Communication: No family at the bedside    Consultants:   GI - no specific recommendations, no official consultation   Procedures:   None  Antimicrobials:   None    Signed:  Leisa Lenz, MD  [facility] 02/05/2017, 11:21 AM  Pager #: [phone]  Time spent in minutes: less than 30 minutes    Discharge Exam: Vitals:   02/05/17 0949 02/05/17 1057  BP: 113/70 108/63  Pulse: 96 (!) 104  Resp: 16 18  Temp: (!) 100.5 F (38.1 C) 99.5 F (37.5 C)   Vitals:   02/05/17 0536 02/05/17 0850 02/05/17 0949 02/05/17 1057  BP: (!) 131/52 125/68 113/70 108/63  Pulse: 86 91 96 (!) 104  Resp: 16 16 16 18   Temp: 97.7 F (36.5 C) 99.5 F (37.5 C) (!) 100.5 F (38.1 C) 99.5 F (37.5 C)  TempSrc: Oral Oral Axillary Oral  SpO2: 100% 99%  96%  Weight:      Height:        General: Pt is alert, follows commands appropriately, not in acute distress Cardiovascular: Regular rate and rhythm, S1/S2 +, no murmurs Respiratory: Clear to auscultation bilaterally, no wheezing Abdominal: Soft, non tender, non distended, bowel sounds +, no guarding Extremities: no edema, no cyanosis, pulses palpable bilaterally DP and PT Neuro: Grossly nonfocal  Discharge Instructions  Discharge Instructions    Call MD for:  persistant nausea and vomiting    Complete by:  As directed    Call MD for:  redness, tenderness, or signs of infection (pain, swelling, redness, odor or green/yellow discharge around incision site)  Complete by:  As directed    Call MD for:  severe uncontrolled pain    Complete by:  As directed    Diet - low sodium heart healthy    Complete by:  As directed    Discharge instructions    Complete by:  As directed    Please follow up with PCP and GI when your appt is scheduled. Your hemoglobin is stable at 12.1. Continue current medications.    Increase activity slowly    Complete by:  As directed      Allergies as of 02/05/2017      Reactions   Niacin Hives      Medication List    TAKE these medications   acetaminophen 325 MG tablet Commonly known as:  TYLENOL Take 650 mg by mouth every 6 (six) hours as needed for mild pain, moderate pain, fever or headache.   atenolol 25 MG tablet Commonly known as:  TENORMIN TAKE ONE-HALF TABLET BY MOUTH ONCE DAILY. What changed:  See the new instructions.   famotidine 20 MG tablet Commonly known as:  PEPCID Take 1 tablet (20 mg total) by mouth 2 (two) times daily.   hydrocortisone 2.5 % rectal cream Commonly known as:  ANUSOL-HC Place rectally 3 (three) times daily.   hydrocortisone 25 MG suppository Commonly known as:  ANUSOL-HC Place 1 suppository (25 mg total) rectally 2 (two) times daily.   lactose free nutrition Liqd Take 237 mLs by mouth 3 (three) times daily with meals.   lisinopril 5 MG tablet Commonly known as:  PRINIVIL,ZESTRIL Take 1 tablet (5 mg total) by mouth daily. What changed:  when to take this   mesalamine 1000 MG suppository Commonly known as:  CANASA Place 1 suppository (1,000 mg total) rectally at bedtime.   nitroGLYCERIN 0.4 MG SL tablet Commonly known as:  NITROSTAT DISSOLVE ONE TABLET UNDER THE TONGUE EVERY 5 MINUTES AS NEEDED What changed:  how much to take  how to take this  when to take this  reasons to take this  additional instructions   rosuvastatin 10 MG tablet Commonly known as:  CRESTOR Take one tablet by mouth daily prior to bedtime What changed:  how much to take  how to take this  when to take this  additional instructions   traMADol 50 MG tablet Commonly known as:  ULTRAM Take 2 tablets (100 mg total) by mouth 3 (three) times daily.   triamterene-hydrochlorothiazide 37.5-25 MG tablet Commonly known as:  MAXZIDE-25 Take 1 tablet by mouth at bedtime.      Follow-up Information    Eulas Post,  MD. Schedule an appointment as soon as possible for a visit in 1 week(s).   Specialty:  Family Medicine Contact information: [place] [address] [phone]            The results of significant diagnostics from this hospitalization (including imaging, microbiology, ancillary and laboratory) are listed below for reference.    Significant Diagnostic Studies: Nm Pet Image Initial (pi) Skull Base To Thigh  Result Date: 01/30/2017 CLINICAL DATA:  Initial treatment strategy for follicular lymphoma. EXAM: NUCLEAR MEDICINE PET SKULL BASE TO THIGH TECHNIQUE: 9.32 mCi F-18 FDG was injected intravenously. Full-ring PET imaging was performed from the skull base to thigh after the radiotracer. CT data was obtained and used for attenuation correction and anatomic localization. FASTING BLOOD GLUCOSE:  Value: 94 mg/dl COMPARISON:  None. FINDINGS: NECK 8 mm left-sided level 2 a lymph node just anterior to  the sternocleidomastoid muscle is hypermetabolic with SUV max of 5.2. Slightly more inferior and posterior is a 7 mm level 2 B lymph node which is hypermetabolic with SUV max of 3.5. CHEST Small but metabolically active mediastinal and hilar lymph nodes. Pretracheal lymph node on image 53 measures 7 mm and SUV max is 4.2 Aorticopulmonary window lymph node on image number 57 measures 8.5 mm and SUV max is 4.7 12 mm subcarinal lymph node on image number 62 has an SUV max of 5.5 Small right hilar lymph nodes have an SUV max of 4.7. Subpleural cystic change and inflammation noted in the lower lobes bilaterally with mild hypermetabolism but no definite pulmonary lesions. ABDOMEN/PELVIS No splenomegaly or splenic lesions. The spleen demonstrates mild hypermetabolism. The left adrenal gland is hypermetabolic with SUV max of 0.48 elbowed no discrete lesion is identified it is clearly asymmetric when compared to the right adrenal gland. Focal area of hypermetabolism suspected in the ascending  colon. Possible soft tissue mass on the CT scan. SUV max is 6.5. There is an adjacent lymph node measuring 13 mm on image number 133 with SUV max of 7.3 SKELETON No focal hypermetabolic activity to suggest skeletal metastasis. IMPRESSION: 1. Small scattered hypermetabolic lymph nodes in the left neck, mediastinum, hilum and right lower quadrant consistent with known lymphoma. 2. Possible right colon lesion but recommend correlation with recent colonoscopy. Electronically Signed   By: Marijo Sanes M.D.   On: 01/30/2017 16:31    Microbiology: No results found for this or any previous visit (from the past 240 hour(s)).   Labs: Basic Metabolic Panel:  Recent Labs Lab 02/03/17 1023 02/04/17 0405 02/05/17 0339  NA 140 139 139  K 3.9 3.4* 4.0  CL 105 106 106  CO2 28 28 29   GLUCOSE 104* 108* 109*  BUN 10 8 7   CREATININE 0.93 0.86 0.89  CALCIUM 8.4* 8.1* 8.0*   Liver Function Tests:  Recent Labs Lab 02/03/17 1023  AST 20  ALT 12*  ALKPHOS 57  BILITOT 0.8  PROT 5.5*  ALBUMIN 2.3*   No results for input(s): LIPASE, AMYLASE in the last 168 hours. No results for input(s): AMMONIA in the last 168 hours. CBC:  Recent Labs Lab 02/03/17 0935  02/03/17 1748 02/04/17 0405 02/04/17 1239 02/04/17 1851 02/05/17 0339  WBC 10.4  --   --  9.8  --   --  9.1  HGB 13.4  < > 12.5* 12.5* 11.8* 12.1* 12.1*  HCT 40.3  < > 38.1* 38.0* 35.6* 36.6* 36.8*  MCV 93.5  --   --  93.1  --   --  94.1  PLT 313  --   --  306  --   --  276  < > = values in this interval not displayed. Cardiac Enzymes: No results for input(s): CKTOTAL, CKMB, CKMBINDEX, TROPONINI in the last 168 hours. BNP: BNP (last 3 results) No results for input(s): BNP in the last 8760 hours.  ProBNP (last 3 results) No results for input(s): PROBNP in the last 8760 hours.  CBG:  Recent Labs Lab 01/30/17 1246  GLUCAP 94

## 2017-02-05 NOTE — Progress Notes (Signed)
T 100.5 AX, p 96,R16 BP 113/70 .Mild shaking chils diabetic DR Charlies Silvers notified. He denise N.V and pain.He staes that he just feels bad

## 2017-02-05 NOTE — Discharge Instructions (Signed)
Gastrointestinal Bleeding °Gastrointestinal bleeding is bleeding somewhere along the path food travels through the body (digestive tract). This path is anywhere between the mouth and the opening of the butt (anus). You may have blood in your poop (stools) or have black poop. If you throw up (vomit), there may be blood in it. °This condition can be mild, serious, or even life-threatening. If you have a lot of bleeding, you may need to stay in the hospital. °Follow these instructions at home: °· Take over-the-counter and prescription medicines only as told by your doctor. °· Eat foods that have a lot of fiber in them. These foods include whole grains, fruits, and vegetables. You can also try eating 1-3 prunes each day. °· Drink enough fluid to keep your pee (urine) clear or pale yellow. °· Keep all follow-up visits as told by your doctor. This is important. °Contact a doctor if: °· Your symptoms do not get better. °Get help right away if: °· Your bleeding gets worse. °· You feel dizzy or you pass out (faint). °· You feel weak. °· You have very bad cramps in your back or belly (abdomen). °· You pass large clumps of blood (clots) in your poop. °· Your symptoms are getting worse. °This information is not intended to replace advice given to you by your health care provider. Make sure you discuss any questions you have with your health care provider. °Document Released: 07/23/2008 Document Revised: 03/21/2016 Document Reviewed: 04/03/2015 °Elsevier Interactive Patient Education © 2017 [employer] ° °

## 2017-02-07 ENCOUNTER — Ambulatory Visit: Payer: Medicare Other | Admitting: Family Medicine

## 2017-02-07 ENCOUNTER — Telehealth: Payer: Self-pay | Admitting: Oncology

## 2017-02-07 NOTE — Telephone Encounter (Signed)
lvm to inform pt of MD appt 4/17 at 130 pm per lOS

## 2017-02-10 ENCOUNTER — Telehealth: Payer: Self-pay | Admitting: Internal Medicine

## 2017-02-10 NOTE — Telephone Encounter (Signed)
New message      Pt c/o BP issue: STAT if pt c/o blurred vision, one-sided weakness or slurred speech  1. What are your last 5 BP readings?  108/65 HR102  This am; 113/60 HR 71 Sunday; 108/60 HR 78 saturday  2. Are you having any other symptoms (ex. Dizziness, headache, blurred vision, passed out)?  No energy, no appetite 3. What is your BP issue?  Pt was discharged from hosp last week.  Pt states that they stopped some of his medications.  Pt does not know the name of all of the meds they stopped.  Please call

## 2017-02-10 NOTE — Telephone Encounter (Signed)
Reviewed medications with patient from his hospital discharge summary.   Seemed he is taking them all accurately except he does not have pantoprazole on his discharge summary.  I advised to not take that currently but to continue famotidine.   Advised to take his medication bottles with him to his PCP appointment next week.

## 2017-02-11 ENCOUNTER — Other Ambulatory Visit: Payer: Self-pay | Admitting: *Deleted

## 2017-02-11 ENCOUNTER — Ambulatory Visit (HOSPITAL_BASED_OUTPATIENT_CLINIC_OR_DEPARTMENT_OTHER): Payer: Medicare Other | Admitting: Oncology

## 2017-02-11 ENCOUNTER — Encounter (HOSPITAL_COMMUNITY): Payer: Self-pay

## 2017-02-11 VITALS — BP 115/67 | HR 80 | Temp 97.7°F | Resp 18 | Ht 72.0 in | Wt 182.9 lb

## 2017-02-11 DIAGNOSIS — I10 Essential (primary) hypertension: Secondary | ICD-10-CM

## 2017-02-11 DIAGNOSIS — C8313 Mantle cell lymphoma, intra-abdominal lymph nodes: Secondary | ICD-10-CM

## 2017-02-11 DIAGNOSIS — I959 Hypotension, unspecified: Secondary | ICD-10-CM

## 2017-02-11 DIAGNOSIS — K625 Hemorrhage of anus and rectum: Secondary | ICD-10-CM | POA: Diagnosis not present

## 2017-02-11 DIAGNOSIS — R531 Weakness: Secondary | ICD-10-CM | POA: Diagnosis not present

## 2017-02-11 DIAGNOSIS — R634 Abnormal weight loss: Secondary | ICD-10-CM

## 2017-02-11 DIAGNOSIS — C8310 Mantle cell lymphoma, unspecified site: Secondary | ICD-10-CM

## 2017-02-11 DIAGNOSIS — R5383 Other fatigue: Secondary | ICD-10-CM

## 2017-02-11 DIAGNOSIS — R630 Anorexia: Secondary | ICD-10-CM

## 2017-02-11 MED ORDER — HYDROCORTISONE ACETATE 25 MG RE SUPP: 25.0000 mg | Freq: Two times a day (BID) | RECTAL | 0 refills | Status: DC

## 2017-02-11 NOTE — Progress Notes (Signed)
[facility] Follow up:    [place], [place] [id number]   DIAGNOSIS: Cancer Staging No matching staging information was found for the patient.  SUMMARY OF ONCOLOGIC HISTORY:   Patient has known history of hypertension, gastroesophageal reflux disease, hypercholesterolemia has been losing weight for the last 6 months.    Patient also has been having periodic rectal bleeding hence he was advised to have a consultation with a gastroenterologist.  Patient had a colonoscopy performed a in 2008 found to have 1 polyp. Patient was advised to have a repeat colonoscopy 5 years later however patient did not go for her next colonoscopy.   Eventually due to chronic rectal bleeding,  patient had a colonoscopy by his gastroenterologist, performed on the 12/30/2016.  According to the gastroenterologist report, his terminal ileum was moderately inflamed and ulcerated,  biopsies were taken with cold forceps, 2 sessile polyps were found in the ascending colon,  the rectosigmoid region was moderately inflamed,  biopsies were taken exam otherwise was normal under direct retroflexion views. A clinical diagnosis has been  made as ileocolitis by the gastroenterologist  However pathologist has reported, small bowel mucosa with atypical lymphoid infiltrates and scattered active inflammation.  A right colon biopsy also showed colonic mucosa with atypical lymphoid infiltrates and scattered active inflammatory cells.  Surgical biopsy of the ascending colon polyp was documented as tubular adenoma with the atypical lymphoid infiltrates.   Left colon biopsy also has revealed atypical lymphoid infiltrates with scattered inflammatory cells. Biopsy of the rectal mucosa  has revealed atypical lymphoid infiltrates and active inflammatory cells without any dysplasia.   Microscopic examination with special stains has revealed infiltrates positive for  CD20 cells, positive for BCL 6 and BCL-2 with high Ki 67 at 50%   Hence a presumptive diagnosis of follicular lymphoma has been made.  The above or samples were also sent to the [facility] for karyotype analysis, however no evidence of  BCL 6 or BCL-2 disruption.   Today patient presented to the hematology clinic to discuss further management options. Patient has been accompanied by his wife and daughter to the clinic.       CURRENT THERAPY:  INTERVAL HISTORY: [name]. 83 y.o. male returns for scheduled follow-up visit.  Patient had a bone marrow biopsy performed in the outpatient clinic. Bone marrow biopsy read as presence of a involvement of lymphoma cells. His case was discussed this morning in the multidisciplinary tumor board.  Pathologist felt he might have mantle cell lymphoma. Initial colon biopsy has been positive for cyclin D1. However the  bone marrow specimen was sent to  [facility] for 11: 14 translocation. The above tests came back to negative.  At present patient still complaining of weakness and fatigue. He still has rectal discomfort. He has periodic hematochezia. Patient feels his appetite is poor. Today is accompanied by his wife and daughter to the clinic. I have discussed the bone marrow biopsy results.  Patient also had a PET scan which also showed activity in the left supraclavicular region as well as in the mediastinum.    Patient Active Problem List   Diagnosis Date Noted  . 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 GI bleed 02/03/2017  . Lower GI bleed 02/03/2017  . Coronary artery disease   . IFG (impaired fasting glucose) 10/03/2015  . PMR (polymyalgia rheumatica) (HCC) 09/07/2015  . Orthostatic hypotension 08/24/2015  .  Viral syndrome 08/24/2015  . GERD (gastroesophageal reflux disease) 03/24/2014  . ACP (advance care planning) 11/02/2013  . Left upper arm pain  09/15/2012  . Routine health maintenance 10/29/2011  . HEARING LOSS 11/05/2010  . Chest pain 11/06/2009  . Coronary atherosclerosis 01/16/2009  . SKIN CANCER, RECURRENT 10/13/2007  . Hyperlipidemia 10/13/2007  . Gout 10/13/2007  . Essential hypertension 10/13/2007  . PVC (premature ventricular contraction) 10/13/2007  . PSORIASIS 10/13/2007  . OSTEOARTHRITIS, ANKLE, RIGHT 10/13/2007  . Other acquired absence of organ 10/13/2007    is allergic to niacin.  MEDICAL HISTORY: Past Medical History:  Diagnosis Date  . Coronary artery disease    a. stent (promus) Cx/OM'09  . Fibromyalgia dx'd ~ 04/2015  . GERD (gastroesophageal reflux disease)   . Gout   . HEARING LOSS    "temporary"  . Hyperlipidemia   . HYPERLIPIDEMIA 10/13/2007  . Hypertension   . HYPERTENSION 10/13/2007  . Osteoarthritis    "left shoulder" (08/23/2015)  . Prostatitis, acute   . PROSTATITIS, ACUTE, HX OF 10/13/2007  . PSORIASIS 10/13/2007  . PVC (premature ventricular contraction)    hx  . SKIN CANCER, RECURRENT 10/13/2007    SURGICAL HISTORY: Past Surgical History:  Procedure Laterality Date  . APPENDECTOMY    . CORONARY ANGIOPLASTY WITH STENT PLACEMENT    . CYST EXCISION Right X 2   shoulder  . ELECTROLYSIS OF MISDIRECTED LASHES Bilateral    eyelashes are misdirected and grow inwards   . EYE SURGERY    . MASS EXCISION Right 01/2005   proximal thigh soft tissue mass  . TEAR DUCT PROBING Left   . TYMPANOPLASTY Bilateral    "for hearing loss; put tubes in also, 2X on right, 1X on the left; tubes worked"  . VASECTOMY      SOCIAL HISTORY: Social History   Social History  . Marital status: Married    Spouse name: N/A  . Number of children: 3  . Years of education: N/A   Occupational History  . retired Retired    from [employer].   Social History Main Topics  . Smoking status: Never Smoker  . Smokeless tobacco: Never Used  . Alcohol use No  . Drug use: No  . Sexual activity: No   Other  Topics Concern  . Not on file   Social History Narrative   Married 1958   2 daughters- '59, 68, 1 son '61   8 grandchildren   Retired from [employer], works PT as a Curator. Now retired completely (09/2008)   End of life; does not want heroic measures if in a persistent vegative state    FAMILY HISTORY: Family History  Problem Relation Age of Onset  . Heart attack Father 52    died  . Heart disease Father   . Parkinsonism Mother 9    died  . Breast cancer Sister     died  . Lung cancer      uncle died  . Colon cancer Neg Hx     Review of Systems - Oncology     Constitutional: Positive for appetite change (Patient feels he has no taste nor appetite ), fatigue (Complaints of fatigue) and unexpected weight change (Patient lost about 25 pounds in 6 months). Negative for chills, diaphoresis and fever.  HENT:   Negative for hearing loss, lump/mass, mouth sores, nosebleeds, sore throat, tinnitus and trouble swallowing.   Eyes: Negative for icterus.  Respiratory: Negative for chest tightness, cough, hemoptysis, shortness of breath and  wheezing.   Cardiovascular: Negative for chest pain, leg swelling and palpitations.  Gastrointestinal: Negative for abdominal distention, abdominal pain, blood in stool, constipation, diarrhea, nausea, rectal pain and vomiting.  Endocrine: Negative for hot flashes.  Genitourinary: Negative for bladder incontinence, difficulty urinating, dysuria, frequency, hematuria, nocturia and pelvic pain.   Musculoskeletal: Negative for arthralgias, back pain, flank pain, gait problem, myalgias, neck pain and neck stiffness.  Skin: Negative for itching and rash.  Neurological: Negative for dizziness, extremity weakness, gait problem, headaches, light-headedness, numbness, seizures and speech difficulty.  Hematological: Negative for adenopathy. Does not bruise/bleed easily.  Psychiatric/Behavioral: Negative for confusion, decreased concentration, sleep disturbance and  suicidal ideas. The patient is not nervous/anxious.     PHYSICAL EXAMINATION  Constitutional: He is oriented to person, place, and time. No distress.  Patient appeared somewhat cachectic, moderately nourished well-built  HENT:  Head: Normocephalic and atraumatic.  Right Ear: External ear normal.  Left Ear: External ear normal.  Mouth/Throat: No oropharyngeal exudate.  Eyes: Conjunctivae and EOM are normal. Pupils are equal, round, and reactive to light. Right eye exhibits no discharge. Left eye exhibits no discharge. No scleral icterus.  Neck: No JVD present. No tracheal deviation present. No thyromegaly present.  Cardiovascular: Normal rate and regular rhythm.  Exam reveals no gallop and no friction rub.   No murmur heard. Pulmonary/Chest: Breath sounds normal. No stridor. No respiratory distress. He has no wheezes. He has no rales. He exhibits no tenderness.  Abdominal: Soft. Bowel sounds are normal. He exhibits no distension and no mass. There is no tenderness. There is no rebound and no guarding.  Musculoskeletal: Normal range of motion. He exhibits no edema, tenderness or deformity.  Lymphadenopathy:    He has no cervical adenopathy.  Neurological: He is alert and oriented to person, place, and time. No cranial nerve deficit. Gait normal. Coordination normal.  Skin: Skin is warm and dry. No rash noted. He is not diaphoretic. No erythema. No pallor.  Psychiatric: Mood, memory, affect and judgment normal.     ECOG PERFORMANCE STATUS: 1 - Symptomatic but completely ambulatory  Vitals:   02/11/17 1353  BP: 115/67  Pulse: 80  Resp: 18  Temp: 97.7 F (36.5 C)    Physical Exam     LABORATORY DATA:  CBC    Component Value Date/Time   WBC 9.1 02/05/2017 0339   RBC 3.91 (L) 02/05/2017 0339   HGB 12.1 (L) 02/05/2017 0339   HGB 13.7 01/28/2017 1157   HCT 36.8 (L) 02/05/2017 0339   HCT 41.1 01/28/2017 1157   PLT 276 02/05/2017 0339   PLT 255 01/28/2017 1157   MCV 94.1  02/05/2017 0339   MCV 93.3 01/28/2017 1157   MCH 30.9 02/05/2017 0339   MCHC 32.9 02/05/2017 0339   RDW 14.3 02/05/2017 0339   RDW 13.7 01/28/2017 1157   LYMPHSABS 1.4 01/28/2017 1157   MONOABS 0.8 01/28/2017 1157   EOSABS 0.1 01/28/2017 1157   BASOSABS 0.0 01/28/2017 1157    CMP     Component Value Date/Time   NA 139 02/05/2017 0339   NA 140 01/24/2017 1115   K 4.0 02/05/2017 0339   K 3.9 01/24/2017 1115   CL 106 02/05/2017 0339   CO2 29 02/05/2017 0339   CO2 26 01/24/2017 1115   GLUCOSE 109 (H) 02/05/2017 0339   GLUCOSE 88 01/24/2017 1115   BUN 7 02/05/2017 0339   BUN 16.7 01/24/2017 1115   CREATININE 0.89 02/05/2017 0339   CREATININE 1.2 01/24/2017 1115  CALCIUM 8.0 (L) 02/05/2017 0339   CALCIUM 8.8 01/24/2017 1115   PROT 5.5 (L) 02/03/2017 1023   PROT 6.1 (L) 01/24/2017 1115   ALBUMIN 2.3 (L) 02/03/2017 1023   ALBUMIN 2.6 (L) 01/24/2017 1115   AST 20 02/03/2017 1023   AST 15 01/24/2017 1115   ALT 12 (L) 02/03/2017 1023   ALT 12 01/24/2017 1115   ALKPHOS 57 02/03/2017 1023   ALKPHOS 77 01/24/2017 1115   BILITOT 0.8 02/03/2017 1023   BILITOT 0.44 01/24/2017 1115   GFRNONAA >60 02/05/2017 0339   GFRAA >60 02/05/2017 0339       PENDING LABS:   RADIOGRAPHIC STUDIES: Study Result   CLINICAL DATA:  Initial treatment strategy for follicular lymphoma.  EXAM: NUCLEAR MEDICINE PET SKULL BASE TO THIGH  TECHNIQUE: 9.32 mCi F-18 FDG was injected intravenously. Full-ring PET imaging was performed from the skull base to thigh after the radiotracer. CT data was obtained and used for attenuation correction and anatomic localization.  FASTING BLOOD GLUCOSE:  Value: 94 mg/dl  COMPARISON:  None.  FINDINGS: NECK  8 mm left-sided level 2 a lymph node just anterior to the sternocleidomastoid muscle is hypermetabolic with SUV max of 5.2. Slightly more inferior and posterior is a 7 mm level 2 B lymph node which is hypermetabolic with SUV max of  3.5.  CHEST  Small but metabolically active mediastinal and hilar lymph nodes.  Pretracheal lymph node on image 53 measures 7 mm and SUV max is 4.2  Aorticopulmonary window lymph node on image number 57 measures 8.5 mm and SUV max is 4.7  12 mm subcarinal lymph node on image number 62 has an SUV max of 5.5  Small right hilar lymph nodes have an SUV max of 4.7.  Subpleural cystic change and inflammation noted in the lower lobes bilaterally with mild hypermetabolism but no definite pulmonary lesions.  ABDOMEN/PELVIS  No splenomegaly or splenic lesions. The spleen demonstrates mild hypermetabolism.  The left adrenal gland is hypermetabolic with SUV max of 2.35 elbowed no discrete lesion is identified it is clearly asymmetric when compared to the right adrenal gland.  Focal area of hypermetabolism suspected in the ascending colon. Possible soft tissue mass on the CT scan. SUV max is 6.5. There is an adjacent lymph node measuring 13 mm on image number 133 with SUV max of 7.3  SKELETON  No focal hypermetabolic activity to suggest skeletal metastasis.  IMPRESSION: 1. Small scattered hypermetabolic lymph nodes in the left neck, mediastinum, hilum and right lower quadrant consistent with known lymphoma. 2. Possible right colon lesion but recommend correlation with recent colonoscopy.   Electronically Signed   By: Marijo Sanes M.D.   On: 01/30/2017 16:31     PATHOLOGY:   . The bone marrow biopsy results, cytogenetic studies and molecular studies. The about test results were shared with patient and his daughter.  ASSESSMENT and THERAPY PLAN:   1. Newly diagnosed   lymphoma of the GI tract, suspected mantle cell lymphoma. 2. History of hypertension 3. History of hypercholesterolemia 4. Significant weight loss. 5. Chronic rectal bleeding. 6. Hypotension   [name] is a 83 year old gentleman presented with the above symptomatology and   rectal bleeding.  Colonoscopy and biopsies revealed abnormal lymphoid aggregates in the colon as well as in the small intestine.  Biopsy results of the colon specimen was suspicious for non-Hodgkin's lymphoma. Initial immunohistochemical studies for cyclin D1 were positive, suspicious for mantle cell lymphoma.  Patient also had a bone marrow  biopsy which has revealed presence of lymphoma involvement. Flow cytometry was consistent with a B-cell lymphoma.  However molecular studies for molecular studies for 11:14 translocation was negative.  His case was discussed this morning in the hematology tumor board. All the physicians were felt,  it is consistent with mantle cell lymphoma.  All the  treatment options were discussed in the tumor board.   Various regimens were discussed including chemotherapy with combination of Rituxan plus bendamustine or  Ibrutinib.  I have discussed with patient and family regarding the above recommendations. However I'll be leaving the practice.Hence  I have referred the patient to Dr. Irene Limbo to be  seen within this week.   Patient will be started with the chemotherapy soon. I have discussed the benefits and side effects of the chemotherapy in detail with patient and his family. No problem-specific Assessment & Plan notes found for this encounter.   Orders Placed This Encounter  Procedures  . CBC with Differential    Standing Status:   Future    Standing Expiration Date:   02/11/2018  . Comprehensive metabolic panel    Standing Status:   Future    Standing Expiration Date:   02/11/2018  . Lactate dehydrogenase (LDH)    Standing Status:   Future    Standing Expiration Date:   02/11/2018  . Beta 2 microglobulin    Standing Status:   Future    Standing Expiration Date:   02/11/2018    All questions were answered. The patient knows to call the clinic with any problems, questions or concerns. We can certainly see the patient much sooner if necessary. This note  was electronically signed. Judge Stall, MD 02/11/2017

## 2017-02-13 ENCOUNTER — Telehealth: Payer: Self-pay | Admitting: Hematology

## 2017-02-13 NOTE — Telephone Encounter (Signed)
Scheduled appts per sch message from Paulita Fujita- patient is aware of appt date and time .

## 2017-02-18 ENCOUNTER — Ambulatory Visit: Payer: Medicare Other | Admitting: Family Medicine

## 2017-02-19 ENCOUNTER — Encounter: Payer: Self-pay | Admitting: Physician Assistant

## 2017-02-19 ENCOUNTER — Ambulatory Visit (INDEPENDENT_AMBULATORY_CARE_PROVIDER_SITE_OTHER): Payer: Medicare Other | Admitting: Physician Assistant

## 2017-02-19 ENCOUNTER — Encounter (INDEPENDENT_AMBULATORY_CARE_PROVIDER_SITE_OTHER): Payer: Self-pay

## 2017-02-19 VITALS — BP 100/50 | HR 68 | Ht 72.0 in | Wt 175.8 lb

## 2017-02-19 DIAGNOSIS — C8289 Other types of follicular lymphoma, extranodal and solid organ sites: Secondary | ICD-10-CM

## 2017-02-19 DIAGNOSIS — I2510 Atherosclerotic heart disease of native coronary artery without angina pectoris: Secondary | ICD-10-CM | POA: Diagnosis not present

## 2017-02-19 DIAGNOSIS — C8299 Follicular lymphoma, unspecified, extranodal and solid organ sites: Secondary | ICD-10-CM

## 2017-02-19 DIAGNOSIS — I10 Essential (primary) hypertension: Secondary | ICD-10-CM

## 2017-02-19 DIAGNOSIS — E782 Mixed hyperlipidemia: Secondary | ICD-10-CM | POA: Diagnosis not present

## 2017-02-19 MED ORDER — TRIAMTERENE-HCTZ 37.5-25 MG PO TABS: 0.5000 | ORAL_TABLET | Freq: Every day | ORAL | Status: DC

## 2017-02-19 MED ORDER — LISINOPRIL 2.5 MG PO TABS: 2.5000 mg | ORAL_TABLET | Freq: Every day | ORAL | 3 refills | Status: DC

## 2017-02-19 NOTE — Patient Instructions (Addendum)
Medication Instructions:  1. DECREASE MAXZIDE 37.5/25 MG TO TAKING 1/2 TABLET ONCE A DAY  2. DECREASE LISINOPRIL TO 2.5 MG DAILY; NEW RX SENT IN FOR THE 2.5 MG TABLET; THOUGH YOU CAN FINISH THE 5 MG TABLET BY CUTTING THEM IN 1/2   Labwork: NONE ORDERED   Testing/Procedures: NONE ORDERED  Follow-Up: 06/16/17 @ 10:20 WITH DR. ROSS   Any Other Special Instructions Will Be Listed Below (If Applicable).  If you need a refill on your cardiac medications before your next appointment, please call your pharmacy.

## 2017-02-19 NOTE — Progress Notes (Signed)
Cardiology Office Note:    Date:  02/19/2017   ID:  Jacob Bowens., DOB 05/15/1933, MRN [id number]  PCP:  Eulas Post, MD  Cardiologist:  Dr. Dorris Carnes   Electrophysiologist:  n/a Oncologist: Dr. Thana Farr  Referring MD: Eulas Post, MD   Chief Complaint  Patient presents with  . Follow-up    CAD    History of Present Illness:    Jacob Bristol. is a 83 y.o. male with a hx of CAD status post DES to the OM in 2009, HTN, HL, PVCs. Ejection fraction has been mildly reduced in the past. Echo in 10/16 with EF 40-45%. Last seen by Dr. Harrington Challenger 9/17. He was seen in the ED in 12/17 for chest pain. Follow-up Myoview in 12/17 was low risk.  He was diagnosed with follicular lymphoma this year.  He was admitted 4/9-4/11 with rectal bleeding. Hemoglobin remained stable and he did not require transfusion.   He is here for follow up.  He is here with his wife.  His BP has been running low and he was asked to follow up here for adjustments in his medications.  He feels weak.  He denies syncope, near syncope.  He denies chest pain, shortness of breath, orthopnea, PND.  He has bilat LE edema (R>L).  Venous US earlier this month was neg for RLE DVT.    Prior CV studies:   The following studies were reviewed today:  Myoview 10/16/16 Inferior, inferoseptal infarct, EF 48; low risk  Echo 08/24/15   EF 40-45, diffuse HK worse inferolaterally, grade 1 diastolic dysfunction, trivial AI, mild aortic root dilatation (38 mm), MAC, mildly reduced RVSF  Myoview 1/11 Abnormal adenosine nuclear study with prior inferior and apical infarct; mild peri-infarct ischemia towards the apex.; EF 57  LHC 11/17/07 LM ok LAD irregs; Dx prox 50 LCx prox 30, mid 30; large OM 90 RCA mid 40 EF 45-50 PCI: 2.5 x 12 mm Promus DES to the OM  Past Medical History:  Diagnosis Date  . Coronary artery disease    a. stent (promus) Cx/OM'09  . Fibromyalgia dx'd ~ 04/2015  . GERD (gastroesophageal  reflux disease)   . Gout   . HEARING LOSS    "temporary"  . Hyperlipidemia   . HYPERLIPIDEMIA 10/13/2007  . Hypertension   . HYPERTENSION 10/13/2007  . Osteoarthritis    "left shoulder" (08/23/2015)  . Prostatitis, acute   . PROSTATITIS, ACUTE, HX OF 10/13/2007  . PSORIASIS 10/13/2007  . PVC (premature ventricular contraction)    hx  . SKIN CANCER, RECURRENT 10/13/2007    Past Surgical History:  Procedure Laterality Date  . APPENDECTOMY    . CORONARY ANGIOPLASTY WITH STENT PLACEMENT    . CYST EXCISION Right X 2   shoulder  . ELECTROLYSIS OF MISDIRECTED LASHES Bilateral    eyelashes are misdirected and grow inwards   . EYE SURGERY    . MASS EXCISION Right 01/2005   proximal thigh soft tissue mass  . TEAR DUCT PROBING Left   . TYMPANOPLASTY Bilateral    "for hearing loss; put tubes in also, 2X on right, 1X on the left; tubes worked"  . VASECTOMY      Current Medications: Current Meds  Medication Sig  . atenolol (TENORMIN) 25 MG tablet TAKE ONE-HALF TABLET BY MOUTH ONCE DAILY.  . famotidine (PEPCID) 20 MG tablet Take 1 tablet (20 mg total) by mouth 2 (two) times daily.  . hydrocortisone (ANUSOL-HC) 2.5 % rectal cream Place  rectally 3 (three) times daily.  . hydrocortisone (ANUSOL-HC) 25 MG suppository Place 1 suppository (25 mg total) rectally 2 (two) times daily.  . hydrocortisone (ANUSOL-HC) 25 MG suppository Place 1 suppository (25 mg total) rectally 2 (two) times daily.  . mesalamine (CANASA) 1000 MG suppository Place 1 suppository (1,000 mg total) rectally at bedtime.  . nitroGLYCERIN (NITROSTAT) 0.4 MG SL tablet DISSOLVE ONE TABLET UNDER THE TONGUE EVERY 5 MINUTES AS NEEDED  . pantoprazole (PROTONIX) 40 MG tablet Take 1 tablet (40 mg total) by mouth daily.  . rosuvastatin (CRESTOR) 10 MG tablet Take one tablet by mouth daily prior to bedtime  . [DISCONTINUED] lisinopril (PRINIVIL,ZESTRIL) 5 MG tablet Take 1 tablet (5 mg total) by mouth daily.  . [DISCONTINUED]  triamterene-hydrochlorothiazide (MAXZIDE-25) 37.5-25 MG tablet Take 1 tablet by mouth at bedtime.    Current Facility-Administered Medications for the 02/19/17 encounter (Office Visit) with Liliane Shi, PA-C  Medication  . 0.9 %  sodium chloride infusion     Allergies:   Niacin   Social History   Social History  . Marital status: Married    Spouse name: N/A  . Number of children: 3  . Years of education: N/A   Occupational History  . retired Retired    from [employer].   Social History Main Topics  . Smoking status: Never Smoker  . Smokeless tobacco: Never Used  . Alcohol use No  . Drug use: No  . Sexual activity: No   Other Topics Concern  . None   Social History Narrative   Married 1958   2 daughters- '59, 68, 1 son '61   8 grandchildren   Retired from [employer], works PT as a Curator. Now retired completely (09/2008)   End of life; does not want heroic measures if in a persistent vegative state     Family History  Problem Relation Age of Onset  . Heart attack Father 49    died  . Heart disease Father   . Parkinsonism Mother 42    died  . Breast cancer Sister     died  . Lung cancer      uncle died  . Colon cancer Neg Hx      ROS:   Please see the history of present illness.    Review of Systems  Constitution: Positive for decreased appetite and malaise/fatigue.  HENT: Positive for hearing loss.   Respiratory: Positive for cough.   Hematologic/Lymphatic: Bruises/bleeds easily.   All other systems reviewed and are negative.   EKGs/Labs/Other Test Reviewed:    EKG:  EKG is not ordered today.  The ekg ordered today demonstrates n/a  Recent Labs: 02/03/2017: ALT 12 02/05/2017: BUN 7; Creatinine, Ser 0.89; Hemoglobin 12.1; Platelets 276; Potassium 4.0; Sodium 139   Recent Lipid Panel    Component Value Date/Time   CHOL 141 01/22/2016 0952   TRIG 149 01/22/2016 0952   HDL 46 01/22/2016 0952   CHOLHDL 3.1 01/22/2016 0952   VLDL 30 01/22/2016 0952    LDLCALC 65 01/22/2016 0952   LDLDIRECT 128.0 11/16/2007 1454     Physical Exam:    VS:  BP (!) 100/50   Pulse 68   Ht 6' (1.829 m)   Wt 175 lb 12.8 oz (79.7 kg)   BMI 23.84 kg/m     Wt Readings from Last 3 Encounters:  02/19/17 175 lb 12.8 oz (79.7 kg)  02/11/17 182 lb 14.4 oz (83 kg)  02/03/17 185 lb (83.9 kg)  Physical Exam  Constitutional: He is oriented to person, place, and time. He appears well-developed and well-nourished. No distress.  HENT:  Head: Normocephalic and atraumatic.  Eyes: No scleral icterus.  Neck: Normal range of motion. No JVD present. Carotid bruit is not present.  Cardiovascular: Normal rate, regular rhythm, S1 normal and S2 normal.  Exam reveals distant heart sounds.   No murmur heard. Pulmonary/Chest: Effort normal. He has decreased breath sounds. He has no wheezes. He has no rhonchi. He has no rales.  Abdominal: Soft. There is no tenderness.  Musculoskeletal: He exhibits edema (trace bilat LE edema (R>L)).  Neurological: He is alert and oriented to person, place, and time.  Skin: Skin is warm and dry.  Psychiatric: He has a normal mood and affect.    ASSESSMENT:    1. Coronary artery disease involving native coronary artery of native heart without angina pectoris   2. Essential hypertension   3. Mixed hyperlipidemia   4. 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 Coronary artery disease involving native coronary artery of native heart without angina pectoris - s/p DES to OM in 2009.  Myoview in 2017 was low risk.  He denies angina.  ASA is on hold due to rectal bleeding. Continue statin.   If bleeding controlled, would recommend resuming ASA.   2. Essential hypertension - BP running low.  Will decrease Lisinopril to 2.5 mg QD and Maxzide 37.5/25 mg to 1/2 QD.  EF has been mildly low in the past.  Would like to keep him on beta-blocker and ACE inhibitor if possible.    3. Mixed hyperlipidemia - Continue statin.   4. 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  His albumin is low and we discussed using Ensure or Boost to help replace protein.  Dispo:  Return in about 3 months (around 05/21/2017) for Routine Follow Up, w/ Dr. Harrington Challenger.   Medication Adjustments/Labs and Tests Ordered: Current medicines are reviewed at length with the patient today.  Concerns regarding medicines are outlined above.  Orders placed this visit:  No orders of the defined types were placed in this encounter.  Medication changes this visit: Meds ordered this encounter  Medications  . lisinopril (PRINIVIL,ZESTRIL) 2.5 MG tablet    Sig: Take 1 tablet (2.5 mg total) by mouth daily.    Dispense:  90 tablet    Refill:  3  . triamterene-hydrochlorothiazide (MAXZIDE-25) 37.5-25 MG tablet    Sig: Take 0.5 tablets by mouth daily.    Signed, Richardson Dopp, PA-C  02/19/2017 11:41 AM    [facility] [address], [place], [place]  [address] Phone: [phone]; Fax: [phone]

## 2017-02-20 ENCOUNTER — Other Ambulatory Visit (HOSPITAL_BASED_OUTPATIENT_CLINIC_OR_DEPARTMENT_OTHER): Payer: Medicare Other

## 2017-02-20 ENCOUNTER — Ambulatory Visit: Payer: Medicare Other | Admitting: Hematology

## 2017-02-20 DIAGNOSIS — C8310 Mantle cell lymphoma, unspecified site: Secondary | ICD-10-CM

## 2017-02-20 DIAGNOSIS — C8313 Mantle cell lymphoma, intra-abdominal lymph nodes: Secondary | ICD-10-CM

## 2017-02-20 LAB — CBC WITH DIFFERENTIAL/PLATELET
BASO%: 0.4 % (ref 0.0–2.0)
Basophils Absolute: 0 10*3/uL (ref 0.0–0.1)
EOS%: 1.2 % (ref 0.0–7.0)
Eosinophils Absolute: 0.1 10*3/uL (ref 0.0–0.5)
HEMATOCRIT: 42.3 % (ref 38.4–49.9)
HGB: 13.7 g/dL (ref 13.0–17.1)
LYMPH%: 24.4 % (ref 14.0–49.0)
MCH: 30 pg (ref 27.2–33.4)
MCHC: 32.4 g/dL (ref 32.0–36.0)
MCV: 92.8 fL (ref 79.3–98.0)
MONO#: 0.7 10*3/uL (ref 0.1–0.9)
MONO%: 9 % (ref 0.0–14.0)
NEUT%: 65 % (ref 39.0–75.0)
NEUTROS ABS: 5 10*3/uL (ref 1.5–6.5)
Platelets: 318 10*3/uL (ref 140–400)
RBC: 4.56 10*6/uL (ref 4.20–5.82)
RDW: 15.1 % — ABNORMAL HIGH (ref 11.0–14.6)
WBC: 7.7 10*3/uL (ref 4.0–10.3)
lymph#: 1.9 10*3/uL (ref 0.9–3.3)
nRBC: 0 % (ref 0–0)

## 2017-02-20 LAB — COMPREHENSIVE METABOLIC PANEL
ALT: 18 U/L (ref 0–55)
AST: 24 U/L (ref 5–34)
Albumin: 2.6 g/dL — ABNORMAL LOW (ref 3.5–5.0)
Alkaline Phosphatase: 80 U/L (ref 40–150)
Anion Gap: 10 mEq/L (ref 3–11)
BUN: 13.4 mg/dL (ref 7.0–26.0)
CO2: 24 meq/L (ref 22–29)
Calcium: 8.8 mg/dL (ref 8.4–10.4)
Chloride: 104 mEq/L (ref 98–109)
Creatinine: 1.1 mg/dL (ref 0.7–1.3)
EGFR: 65 mL/min/{1.73_m2} — ABNORMAL LOW (ref >90)
GLUCOSE: 115 mg/dL (ref 70–140)
POTASSIUM: 4.1 meq/L (ref 3.5–5.1)
SODIUM: 138 meq/L (ref 136–145)
Total Bilirubin: 0.5 mg/dL (ref 0.20–1.20)
Total Protein: 6.4 g/dL (ref 6.4–8.3)

## 2017-02-20 LAB — LACTATE DEHYDROGENASE: LDH: 164 U/L (ref 125–245)

## 2017-02-21 LAB — BETA 2 MICROGLOBULIN, SERUM: Beta-2: 4.4 mg/L — ABNORMAL HIGH (ref 0.6–2.4)

## 2017-02-24 ENCOUNTER — Ambulatory Visit (HOSPITAL_BASED_OUTPATIENT_CLINIC_OR_DEPARTMENT_OTHER): Payer: Medicare Other | Admitting: Hematology

## 2017-02-24 ENCOUNTER — Encounter: Payer: Self-pay | Admitting: Hematology

## 2017-02-24 ENCOUNTER — Telehealth: Payer: Self-pay | Admitting: Hematology

## 2017-02-24 VITALS — BP 106/60 | HR 89 | Temp 97.6°F | Resp 18 | Wt 175.1 lb

## 2017-02-24 DIAGNOSIS — I10 Essential (primary) hypertension: Secondary | ICD-10-CM | POA: Diagnosis not present

## 2017-02-24 DIAGNOSIS — R5383 Other fatigue: Secondary | ICD-10-CM

## 2017-02-24 DIAGNOSIS — C8313 Mantle cell lymphoma, intra-abdominal lymph nodes: Secondary | ICD-10-CM

## 2017-02-24 DIAGNOSIS — C8318 Mantle cell lymphoma, lymph nodes of multiple sites: Secondary | ICD-10-CM | POA: Diagnosis not present

## 2017-02-24 DIAGNOSIS — I959 Hypotension, unspecified: Secondary | ICD-10-CM | POA: Diagnosis not present

## 2017-02-24 DIAGNOSIS — R634 Abnormal weight loss: Secondary | ICD-10-CM

## 2017-02-24 DIAGNOSIS — I2510 Atherosclerotic heart disease of native coronary artery without angina pectoris: Secondary | ICD-10-CM

## 2017-02-24 DIAGNOSIS — K625 Hemorrhage of anus and rectum: Secondary | ICD-10-CM | POA: Diagnosis not present

## 2017-02-24 DIAGNOSIS — Z801 Family history of malignant neoplasm of trachea, bronchus and lung: Secondary | ICD-10-CM

## 2017-02-24 DIAGNOSIS — E43 Unspecified severe protein-calorie malnutrition: Secondary | ICD-10-CM

## 2017-02-24 DIAGNOSIS — Z803 Family history of malignant neoplasm of breast: Secondary | ICD-10-CM

## 2017-02-24 DIAGNOSIS — R197 Diarrhea, unspecified: Secondary | ICD-10-CM

## 2017-02-24 MED ORDER — PREDNISONE 20 MG PO TABS: 40.0000 mg | ORAL_TABLET | Freq: Every day | ORAL | 0 refills | Status: AC

## 2017-02-24 MED ORDER — DEXAMETHASONE 4 MG PO TABS: 8.0000 mg | ORAL_TABLET | Freq: Every day | ORAL | 1 refills | Status: DC

## 2017-02-24 MED ORDER — ALLOPURINOL 100 MG PO TABS: 200.0000 mg | ORAL_TABLET | Freq: Every day | ORAL | 0 refills | Status: DC

## 2017-02-24 MED ORDER — PROCHLORPERAZINE MALEATE 10 MG PO TABS: 10.0000 mg | ORAL_TABLET | Freq: Four times a day (QID) | ORAL | 1 refills | Status: DC | PRN

## 2017-02-24 MED ORDER — ONDANSETRON HCL 8 MG PO TABS: 8.0000 mg | ORAL_TABLET | Freq: Two times a day (BID) | ORAL | 1 refills | Status: DC | PRN

## 2017-02-24 NOTE — Telephone Encounter (Signed)
Scheduled Chemo education per 4/30 los - Unable to schedule treatment due to capped day - patient aware . Emailed [facility] and will call patient once appt is scheduled.

## 2017-02-24 NOTE — Patient Instructions (Signed)
Thank you for choosing [facility] to provide your oncology and hematology care.  To afford each patient quality time with our providers, please arrive 30 minutes before your scheduled appointment time.  If you arrive late for your appointment, you may be asked to reschedule.  We strive to give you quality time with our providers, and arriving late affects you and other patients whose appointments are after yours.  If you are a no show for multiple scheduled visits, you may be dismissed from the clinic at the providers discretion.   Again, thank you for choosing [facility], our hope is that these requests will decrease the amount of time that you wait before being seen by our physicians.  ______________________________________________________________________ Should you have questions after your visit to the [facility], please contact our office at [phone] between the hours of 8:30 and 4:30 p.m.    Voicemails left after 4:30p.m will not be returned until the following business day.   For prescription refill requests, please have your pharmacy contact us directly.  Please also try to allow 48 hours for prescription requests.   Please contact the scheduling department for questions regarding scheduling.  For scheduling of procedures such as PET scans, CT scans, MRI, Ultrasound, etc please contact central scheduling at [phone].   Resources For Cancer Patients and Caregivers:  American Cancer Society:  800-227-2345  Can help patients locate various types of support and financial assistance Cancer Care: 1-800-813-HOPE (4673) Provides financial assistance, online support groups, medication/co-pay assistance.   [place] DSS:  336-641-3447 Where to apply for food stamps, Medicaid, and utility assistance Medicare Rights Center: 800-333-4114 Helps people with Medicare understand their rights and benefits, navigate the Medicare system, and secure the  quality healthcare they deserve SCAT: 336-333-6589 [place] [employer]'s shared-ride transportation service for eligible riders who have a disability that prevents them from riding the fixed route bus.   For additional information on assistance programs please contact our social worker:   Grier Hock/Abigail Elmore:  [phone] 

## 2017-02-24 NOTE — Progress Notes (Signed)
[name]    HEMATOLOGY/ONCOLOGY CONSULTATION NOTE  Date of Service: 02/24/2017  Patient Care Team: Eulas Post, MD as PCP - General (Family Medicine)  CHIEF COMPLAINTS/PURPOSE OF CONSULTATION:  Newly diagnosed mantle cell lymphoma  HISTORY OF PRESENTING ILLNESS:   Jacob Beske. is a wonderful 83 y.o. male who is transferring care to me for continued evaluation and management of newly diagnosed Mantle cell lymphoma.  Patient is a very pleasant 83 year old with a history of hypertension, dyslipidemia, psoriasis, fibromyalgia, coronary artery disease status post PCI in 2009, GERD who presented with episodic rectal bleeding and weight loss of about 40 pounds since November 2017. His daughter who is a Marine scientist and is present at this visit notes that his weight has dropped from 216 down to 175 pounds. He has also been having bothersome diarrhea that has significantly affected his quality of life.  Patient was evaluated by GI and had a colonoscopy in 12/30/2016 in which his terminal ileum was moderately inflamed and ulcerated, biopsies were taken with cold forceps, 2 sessile polyps were found in the ascending colon, the rectosigmoid region was moderately inflamed, biopsies were taken exam otherwise was normal underdirect retroflexion views. A clinical diagnosis has been made as ileocolitis by the gastroenterologist  However pathologist has reported,small bowel mucosa with atypical lymphoid infiltrates and scattered active inflammation.  A right colon biopsy also showed colonic mucosa with atypical lymphoid infiltrates and scattered active inflammatory cells.  Surgical biopsy of the ascending colon polyp wasdocumented as tubular adenomawith the atypical lymphoid infiltrates.   Left colon biopsy also has revealed atypical lymphoid infiltrates with scattered inflammatory cells. Biopsy of the rectal mucosa has revealed atypical lymphoid infiltrates and active inflammatory cells  without any dysplasia.   Microscopic examination with special stains has revealed infiltrates positive for CD20 cells, positive for BCL 6 and BCL-2 with high Ki 67 at 50% however FISH studies showed no evidence of BCL 6 or BCL-2 disruption. Cells were noted to be cyclin D1 positive. Porter for t(11;14) was not noted to be negative however in tumor Board the pathologist noted that this was likely due to limited sampling. The consensus from pathology was this was consistent overall with a mantle cell lymphoma.  Patient subsequently had a bone marrow biopsy which appeared consistent with mantle cell lymphoma.  PET/CT scan was done on 01/30/2017 and showed scattered hypermetabolic lymphadenopathy in the left neck mediastinum and hilum and right lower quadrant of the abdomen. Possible right colon lesion.  Patient along with his daughter who is an Therapist, sports and his wife are here to discuss treatment options. Patient notes he is very fatigued and is losing weight and is hoping to get started as soon as possible on treatment.  Daughter notes that he has been functioning quite well prior to the diarrhea and weight loss and was able to function independently. Patient notes that the diarrhea is bothering him the most and he has a fair amount of urgency.  No issues with urination.  Notes some chills and night sweats. Notes that he is following with Dr. Ardis Hughs and was on Canasa and hydrocortisone enemas.  After his weight loss he notes his blood pressure has been running somewhat low and this has led to a significant decrease in his blood pressure medications.  We discussed treatment options in details and informed consent was obtained to proceed with bendamustine and Rituxan chemo-immunotherapy .  MEDICAL HISTORY:  Past Medical History:  Diagnosis Date  . Coronary artery disease  a. stent (promus) Cx/OM'09  . Fibromyalgia dx'd ~ 04/2015  . GERD (gastroesophageal reflux disease)   . Gout   . HEARING  LOSS    "temporary"  . Hyperlipidemia   . HYPERLIPIDEMIA 10/13/2007  . Hypertension   . HYPERTENSION 10/13/2007  . Osteoarthritis    "left shoulder" (08/23/2015)  . Prostatitis, acute   . PROSTATITIS, ACUTE, HX OF 10/13/2007  . PSORIASIS 10/13/2007  . PVC (premature ventricular contraction)    hx  . SKIN CANCER, RECURRENT 10/13/2007    SURGICAL HISTORY: Past Surgical History:  Procedure Laterality Date  . APPENDECTOMY    . CORONARY ANGIOPLASTY WITH STENT PLACEMENT    . CYST EXCISION Right X 2   shoulder  . ELECTROLYSIS OF MISDIRECTED LASHES Bilateral    eyelashes are misdirected and grow inwards   . EYE SURGERY    . MASS EXCISION Right 01/2005   proximal thigh soft tissue mass  . TEAR DUCT PROBING Left   . TYMPANOPLASTY Bilateral    "for hearing loss; put tubes in also, 2X on right, 1X on the left; tubes worked"  . VASECTOMY      SOCIAL HISTORY: Social History   Social History  . Marital status: Married    Spouse name: N/A  . Number of children: 3  . Years of education: N/A   Occupational History  . retired Retired    from [employer].   Social History Main Topics  . Smoking status: Never Smoker  . Smokeless tobacco: Never Used  . Alcohol use No  . Drug use: No  . Sexual activity: No   Other Topics Concern  . Not on file   Social History Narrative   Married 1958   2 daughters- '59, 68, 1 son '61   8 grandchildren   Retired from [employer], works PT as a Curator. Now retired completely (09/2008)   End of life; does not want heroic measures if in a persistent vegative state    FAMILY HISTORY: Family History  Problem Relation Age of Onset  . Heart attack Father 85    died  . Heart disease Father   . Parkinsonism Mother 41    died  . Breast cancer Sister     died  . Lung cancer      uncle died  . Colon cancer Neg Hx     ALLERGIES:  is allergic to niacin.  MEDICATIONS:  Current Outpatient Prescriptions  Medication Sig Dispense Refill  . allopurinol  (ZYLOPRIM) 100 MG tablet Take 2 tablets (200 mg total) by mouth daily. 60 tablet 0  . atenolol (TENORMIN) 25 MG tablet TAKE ONE-HALF TABLET BY MOUTH ONCE DAILY. 45 tablet 3  . dexamethasone (DECADRON) 4 MG tablet Take 2 tablets (8 mg total) by mouth daily. Start the day after bendamustine chemotherapy for 2 days. Take with food. 30 tablet 1  . famotidine (PEPCID) 20 MG tablet Take 1 tablet (20 mg total) by mouth 2 (two) times daily. 60 tablet 0  . hydrocortisone (ANUSOL-HC) 2.5 % rectal cream Place rectally 3 (three) times daily. 30 g 0  . hydrocortisone (ANUSOL-HC) 25 MG suppository Place 1 suppository (25 mg total) rectally 2 (two) times daily. 12 suppository 0  . lisinopril (PRINIVIL,ZESTRIL) 2.5 MG tablet Take 1 tablet (2.5 mg total) by mouth daily. 90 tablet 3  . mesalamine (CANASA) 1000 MG suppository Place 1 suppository (1,000 mg total) rectally at bedtime. 30 suppository 6  . nitroGLYCERIN (NITROSTAT) 0.4 MG SL tablet DISSOLVE ONE TABLET UNDER  THE TONGUE EVERY 5 MINUTES AS NEEDED 25 tablet 12  . ondansetron (ZOFRAN) 8 MG tablet Take 1 tablet (8 mg total) by mouth 2 (two) times daily as needed for refractory nausea / vomiting. Start on day 2 after bendamustine chemo. 30 tablet 1  . pantoprazole (PROTONIX) 40 MG tablet Take 1 tablet (40 mg total) by mouth daily. 30 tablet 0  . predniSONE (DELTASONE) 20 MG tablet Take 2 tablets (40 mg total) by mouth daily with breakfast. 10 tablet 0  . prochlorperazine (COMPAZINE) 10 MG tablet Take 1 tablet (10 mg total) by mouth every 6 (six) hours as needed (Nausea or vomiting). 30 tablet 1  . rosuvastatin (CRESTOR) 10 MG tablet Take one tablet by mouth daily prior to bedtime 90 tablet 3  . triamterene-hydrochlorothiazide (MAXZIDE-25) 37.5-25 MG tablet Take 0.5 tablets by mouth daily.     Current Facility-Administered Medications  Medication Dose Route Frequency Provider Last Rate Last Dose  . 0.9 %  sodium chloride infusion  500 mL Intravenous Continuous  Milus Banister, MD        REVIEW OF SYSTEMS:    10 Point review of Systems was done is negative except as noted above.  PHYSICAL EXAMINATION: ECOG PERFORMANCE STATUS: 2 - Symptomatic, <50% confined to bed  . Vitals:   02/24/17 1216  BP: 106/60  Pulse: 89  Resp: 18  Temp: 97.6 F (36.4 C)   Filed Weights   02/24/17 1216  Weight: 175 lb 2 oz (79.4 kg)   .Body mass index is 23.75 kg/m.  GENERAL:alert, in no acute distress and comfortable SKIN: no acute rashes, no significant lesions EYES: conjunctiva are pink and non-injected, sclera anicteric OROPHARYNX: MMM, no exudates, no oropharyngeal erythema or ulceration NECK: supple, no JVD LYMPH:  Small palpable lymph nodes in the left neck , no palpable axillary or inguinal lymphadenopathy  LUNGS: clear to auscultation b/l with normal respiratory effort HEART: regular rate & rhythm ABDOMEN:  normoactive bowel sounds , non tender, not distended. Extremity: Trace  pedal edema PSYCH: alert & oriented x 3 with fluent speech NEURO: no focal motor/sensory deficits  LABORATORY DATA:  I have reviewed the data as listed  . CBC Latest Ref Rng & Units 02/20/2017 02/05/2017 02/04/2017  WBC 4.0 - 10.3 10e3/uL 7.7 9.1 -  Hemoglobin 13.0 - 17.1 g/dL 13.7 12.1(L) 12.1(L)  Hematocrit 38.4 - 49.9 % 42.3 36.8(L) 36.6(L)  Platelets 140 - 400 10e3/uL 318 276 -    . CMP Latest Ref Rng & Units 02/20/2017 02/05/2017 02/04/2017  Glucose 70 - 140 mg/dl 115 109(H) 108(H)  BUN 7.0 - 26.0 mg/dL 13.'4 7 8  '$ Creatinine 0.7 - 1.3 mg/dL 1.1 0.89 0.86  Sodium 136 - 145 mEq/L 138 139 139  Potassium 3.5 - 5.1 mEq/L 4.1 4.0 3.4(L)  Chloride 101 - 111 mmol/L - 106 106  CO2 22 - 29 mEq/L '24 29 28  '$ Calcium 8.4 - 10.4 mg/dL 8.8 8.0(L) 8.1(L)  Total Protein 6.4 - 8.3 g/dL 6.4 - -  Total Bilirubin 0.20 - 1.20 mg/dL 0.50 - -  Alkaline Phos 40 - 150 U/L 80 - -  AST 5 - 34 U/L 24 - -  ALT 0 - 55 U/L 18 - -   . Lab Results  Component Value Date   LDH 164  02/20/2017             RADIOGRAPHIC STUDIES: I have personally reviewed the radiological images as listed and agreed with the findings in the report. Nm  Pet Image Initial (pi) Skull Base To Thigh  Result Date: 01/30/2017 CLINICAL DATA:  Initial treatment strategy for follicular lymphoma. EXAM: NUCLEAR MEDICINE PET SKULL BASE TO THIGH TECHNIQUE: 9.32 mCi F-18 FDG was injected intravenously. Full-ring PET imaging was performed from the skull base to thigh after the radiotracer. CT data was obtained and used for attenuation correction and anatomic localization. FASTING BLOOD GLUCOSE:  Value: 94 mg/dl COMPARISON:  None. FINDINGS: NECK 8 mm left-sided level 2 a lymph node just anterior to the sternocleidomastoid muscle is hypermetabolic with SUV max of 5.2. Slightly more inferior and posterior is a 7 mm level 2 B lymph node which is hypermetabolic with SUV max of 3.5. CHEST Small but metabolically active mediastinal and hilar lymph nodes. Pretracheal lymph node on image 53 measures 7 mm and SUV max is 4.2 Aorticopulmonary window lymph node on image number 57 measures 8.5 mm and SUV max is 4.7 12 mm subcarinal lymph node on image number 62 has an SUV max of 5.5 Small right hilar lymph nodes have an SUV max of 4.7. Subpleural cystic change and inflammation noted in the lower lobes bilaterally with mild hypermetabolism but no definite pulmonary lesions. ABDOMEN/PELVIS No splenomegaly or splenic lesions. The spleen demonstrates mild hypermetabolism. The left adrenal gland is hypermetabolic with SUV max of 8.24 elbowed no discrete lesion is identified it is clearly asymmetric when compared to the right adrenal gland. Focal area of hypermetabolism suspected in the ascending colon. Possible soft tissue mass on the CT scan. SUV max is 6.5. There is an adjacent lymph node measuring 13 mm on image number 133 with SUV max of 7.3 SKELETON No focal hypermetabolic activity to suggest skeletal metastasis. IMPRESSION: 1.  Small scattered hypermetabolic lymph nodes in the left neck, mediastinum, hilum and right lower quadrant consistent with known lymphoma. 2. Possible right colon lesion but recommend correlation with recent colonoscopy. Electronically Signed   By: Marijo Sanes M.D.   On: 01/30/2017 16:31    ASSESSMENT & PLAN:  83 year old male with  #1 newly diagnosed stage IVBE Mantle cell lymphoma He has hypermetabolic lymphadenopathy and extensive bowel involvement. Has significant constitutional symptoms including debilitating fatigue , weight loss of about 40 pounds since November 2017, anorexia, some subjective chills.  #2 persistent bothersome diarrhea and some rectal bleeding. Hemoglobin is stable at this time. This appears to be likely related to his mantle cell lymphoma. Could also have an underlying intermittent disorder. Is on Canasa and hydrocortisone suppositories as per GI.  #3 moderate to severe protein calorie malnutrition with significant weight loss- due to lymphoma and diarrhea.  #4 low normal blood pressure. This is likely related to his weight loss and diarrhea. We'll need to back off on his blood pressure medications progressively.  Plan -I discussed his diagnosis, natural history, prognosis, treatment goals, treatment guidelines in details with him his daughter who is a Marine scientist and his wife. -His daughter is quite helpful in trying to coordinate the information and plan. -After discussing the different treatment options and pros and cons a decision was made to proceed with bendamustine and Rituxan chemotherapy with dose reduced bendamustine for the first cycle. -We will start him on prednisone 40 mg by mouth daily with breakfast to help with his fatigue, anorexia and possibly his diarrhea. We'll do this for 5 days until he gets started on treatment. -Continue follow-up with gastroenterology for management of possible inflammatory bowel disease. -Orders for Bendamustine Rituxan have been  placed. -Dietitian referral to optimize nutritional status -We'll  plan to give him a referral for cancer rehabilitation to try to maintain his physical status during treatment once he gets started with treatment. -Chemo-counseling for Bendamustine/Rituxan for mantle cell lymphoma Setup to start BR in 7 days with pre-chemo labs RTC with Dr Irene Limbo 1 week after chemotherapy with labs for toxicity   All of the patients questions were answered with apparent satisfaction. The patient knows to call the clinic with any problems, questions or concerns.  I spent 50 minutes counseling the patient face to face. The total time spent in the appointment was 80 minutes and more than 50% was on counseling and direct patient cares.    Sullivan Lone MD [place] [facility] Hematology/Oncology Physician [facility]  (Office):       [phone] (Work cell):  [phone] (Fax):           [phone]  02/24/2017 12:41 PM

## 2017-02-24 NOTE — Progress Notes (Signed)
START ON PATHWAY REGIMEN - Lymphoma and CLL     A cycle is every 28 days:     Bendamustine      Rituximab   **Always confirm dose/schedule in your pharmacy ordering system**    Patient Characteristics: Mantle Cell, First Line, Stage II - IV, Transplant Ineligible Disease Type: Not Applicable Disease Type: Mantle Cell Line of therapy: First Line [place] Stage: IV Eligible for Transplant? Transplant Ineligible  Intent of Therapy: Non-Curative / Palliative Intent, Discussed with Patient

## 2017-02-26 LAB — TISSUE HYBRIDIZATION TO NCBH

## 2017-02-26 LAB — CHROMOSOME ANALYSIS, BONE MARROW

## 2017-02-26 LAB — TISSUE HYBRIDIZATION (BONE MARROW)-NCBH

## 2017-02-27 ENCOUNTER — Encounter: Payer: Self-pay | Admitting: *Deleted

## 2017-02-28 DIAGNOSIS — J300 Vasomotor rhinitis: Secondary | ICD-10-CM | POA: Diagnosis not present

## 2017-02-28 DIAGNOSIS — H722X3 Other marginal perforations of tympanic membrane, bilateral: Secondary | ICD-10-CM | POA: Diagnosis not present

## 2017-02-28 DIAGNOSIS — H6993 Unspecified Eustachian tube disorder, bilateral: Secondary | ICD-10-CM | POA: Diagnosis not present

## 2017-02-28 DIAGNOSIS — H6523 Chronic serous otitis media, bilateral: Secondary | ICD-10-CM | POA: Diagnosis not present

## 2017-03-03 ENCOUNTER — Telehealth: Payer: Self-pay | Admitting: Hematology

## 2017-03-03 ENCOUNTER — Other Ambulatory Visit: Payer: Medicare Other

## 2017-03-03 NOTE — Telephone Encounter (Signed)
Scheduled additional appts per 4/30 los. - left message for patient with appt date and time.

## 2017-03-04 ENCOUNTER — Other Ambulatory Visit: Payer: Self-pay | Admitting: *Deleted

## 2017-03-05 ENCOUNTER — Ambulatory Visit (HOSPITAL_BASED_OUTPATIENT_CLINIC_OR_DEPARTMENT_OTHER): Payer: Medicare Other

## 2017-03-05 VITALS — BP 108/41 | HR 64 | Temp 98.2°F | Resp 18

## 2017-03-05 DIAGNOSIS — C8318 Mantle cell lymphoma, lymph nodes of multiple sites: Secondary | ICD-10-CM

## 2017-03-05 DIAGNOSIS — C8310 Mantle cell lymphoma, unspecified site: Secondary | ICD-10-CM

## 2017-03-05 DIAGNOSIS — Z5111 Encounter for antineoplastic chemotherapy: Secondary | ICD-10-CM

## 2017-03-05 DIAGNOSIS — Z5112 Encounter for antineoplastic immunotherapy: Secondary | ICD-10-CM

## 2017-03-05 DIAGNOSIS — C8299 Follicular lymphoma, unspecified, extranodal and solid organ sites: Secondary | ICD-10-CM

## 2017-03-05 LAB — CBC WITH DIFFERENTIAL/PLATELET
BASO%: 0.5 % (ref 0.0–2.0)
BASOS ABS: 0 10*3/uL (ref 0.0–0.1)
EOS%: 2.5 % (ref 0.0–7.0)
Eosinophils Absolute: 0.2 10*3/uL (ref 0.0–0.5)
HCT: 40.1 % (ref 38.4–49.9)
HGB: 13.3 g/dL (ref 13.0–17.1)
LYMPH#: 2 10*3/uL (ref 0.9–3.3)
LYMPH%: 24.5 % (ref 14.0–49.0)
MCH: 30.9 pg (ref 27.2–33.4)
MCHC: 33 g/dL (ref 32.0–36.0)
MCV: 93.5 fL (ref 79.3–98.0)
MONO#: 0.7 10*3/uL (ref 0.1–0.9)
MONO%: 8.3 % (ref 0.0–14.0)
NEUT#: 5.2 10*3/uL (ref 1.5–6.5)
NEUT%: 64.2 % (ref 39.0–75.0)
Platelets: 295 10*3/uL (ref 140–400)
RBC: 4.29 10*6/uL (ref 4.20–5.82)
RDW: 16.1 % — ABNORMAL HIGH (ref 11.0–14.6)
WBC: 8.2 10*3/uL (ref 4.0–10.3)

## 2017-03-05 LAB — COMPREHENSIVE METABOLIC PANEL
ALT: 18 U/L (ref 0–55)
AST: 15 U/L (ref 5–34)
Albumin: 2.5 g/dL — ABNORMAL LOW (ref 3.5–5.0)
Alkaline Phosphatase: 59 U/L (ref 40–150)
Anion Gap: 9 mEq/L (ref 3–11)
BUN: 12.7 mg/dL (ref 7.0–26.0)
CALCIUM: 8.3 mg/dL — AB (ref 8.4–10.4)
CHLORIDE: 109 meq/L (ref 98–109)
CO2: 27 meq/L (ref 22–29)
CREATININE: 0.9 mg/dL (ref 0.7–1.3)
EGFR: 80 mL/min/{1.73_m2} — ABNORMAL LOW (ref >90)
GLUCOSE: 97 mg/dL (ref 70–140)
POTASSIUM: 3.6 meq/L (ref 3.5–5.1)
SODIUM: 144 meq/L (ref 136–145)
Total Bilirubin: 0.54 mg/dL (ref 0.20–1.20)
Total Protein: 5.3 g/dL — ABNORMAL LOW (ref 6.4–8.3)

## 2017-03-05 MED ORDER — ACETAMINOPHEN 325 MG PO TABS
650.0000 mg | ORAL_TABLET | Freq: Once | ORAL | Status: AC
  Administered 2017-03-05: 650 mg via ORAL

## 2017-03-05 MED ORDER — DIPHENHYDRAMINE HCL 25 MG PO CAPS
50.0000 mg | ORAL_CAPSULE | Freq: Once | ORAL | Status: AC
Start: 2017-03-05 — End: 2017-03-05
  Administered 2017-03-05: 50 mg via ORAL

## 2017-03-05 MED ORDER — SODIUM CHLORIDE 0.9 % IV SOLN
375.0000 mg/m2 | Freq: Once | INTRAVENOUS | Status: AC
  Administered 2017-03-05: 800 mg via INTRAVENOUS
  Filled 2017-03-05: qty 50

## 2017-03-05 MED ORDER — ACETAMINOPHEN 325 MG PO TABS
ORAL_TABLET | ORAL | Status: AC
  Filled 2017-03-05: qty 2

## 2017-03-05 MED ORDER — SODIUM CHLORIDE 0.9 % IV SOLN
74.0000 mg/m2 | Freq: Once | INTRAVENOUS | Status: AC
  Administered 2017-03-05: 150 mg via INTRAVENOUS
  Filled 2017-03-05: qty 30

## 2017-03-05 MED ORDER — SODIUM CHLORIDE 0.9 % IV SOLN: 10.0000 mg | Freq: Once | INTRAVENOUS | Status: DC

## 2017-03-05 MED ORDER — DEXAMETHASONE SODIUM PHOSPHATE 10 MG/ML IJ SOLN
10.0000 mg | Freq: Once | INTRAMUSCULAR | Status: AC
  Administered 2017-03-05: 10 mg via INTRAVENOUS

## 2017-03-05 MED ORDER — DEXAMETHASONE SODIUM PHOSPHATE 10 MG/ML IJ SOLN
INTRAMUSCULAR | Status: AC
  Filled 2017-03-05: qty 1

## 2017-03-05 MED ORDER — PALONOSETRON HCL INJECTION 0.25 MG/5ML
0.2500 mg | Freq: Once | INTRAVENOUS | Status: AC
  Administered 2017-03-05: 0.25 mg via INTRAVENOUS

## 2017-03-05 MED ORDER — DIPHENHYDRAMINE HCL 25 MG PO CAPS
ORAL_CAPSULE | ORAL | Status: AC
  Filled 2017-03-05: qty 2

## 2017-03-05 MED ORDER — SODIUM CHLORIDE 0.9 % IV SOLN
Freq: Once | INTRAVENOUS | Status: AC
  Administered 2017-03-05: 09:00:00 via INTRAVENOUS

## 2017-03-05 MED ORDER — PALONOSETRON HCL INJECTION 0.25 MG/5ML
INTRAVENOUS | Status: AC
Start: 2017-03-05 — End: 2017-03-05
  Filled 2017-03-05: qty 5

## 2017-03-05 NOTE — Progress Notes (Signed)
Patient tolerated treatment well. Patient and vital signs stable upon discharge.  

## 2017-03-05 NOTE — Patient Instructions (Signed)
[facility] Discharge Instructions for Patients Receiving Chemotherapy  Today you received the following chemotherapy agents bendamustine (Treanda) and rituximab (Rituxan).  To help prevent nausea and vomiting after your treatment, we encourage you to take your nausea medication as directed by your doctor.   If you develop nausea and vomiting that is not controlled by your nausea medication, call the clinic.   BELOW ARE SYMPTOMS THAT SHOULD BE REPORTED IMMEDIATELY:  *FEVER GREATER THAN 100.5 F  *CHILLS WITH OR WITHOUT FEVER  NAUSEA AND VOMITING THAT IS NOT CONTROLLED WITH YOUR NAUSEA MEDICATION  *UNUSUAL SHORTNESS OF BREATH  *UNUSUAL BRUISING OR BLEEDING  TENDERNESS IN MOUTH AND THROAT WITH OR WITHOUT PRESENCE OF ULCERS  *URINARY PROBLEMS  *BOWEL PROBLEMS  UNUSUAL RASH Items with * indicate a potential emergency and should be followed up as soon as possible.  Feel free to call the clinic you have any questions or concerns. The clinic phone number is [phone].  Please show the [facility] at check-in to the Emergency Department and triage nurse.  Bendamustine Injection What is this medicine? BENDAMUSTINE (BEN da MUS teen) is a chemotherapy drug. It is used to treat chronic lymphocytic leukemia and non-Hodgkin lymphoma. This medicine may be used for other purposes; ask your health care provider or pharmacist if you have questions. COMMON BRAND NAME(S): BENDEKA, Treanda What should I tell my health care provider before I take this medicine? They need to know if you have any of these conditions: -infection (especially a virus infection such as chickenpox, cold sores, or herpes) -kidney disease -liver disease -an unusual or allergic reaction to bendamustine, mannitol, other medicines, foods, dyes, or preservatives -pregnant or trying to get pregnant -breast-feeding How should I use this medicine? This medicine is for infusion into a vein. It is  given by a health care professional in a hospital or clinic setting. Talk to your pediatrician regarding the use of this medicine in children. Special care may be needed. Overdosage: If you think you have taken too much of this medicine contact a poison control center or emergency room at once. NOTE: This medicine is only for you. Do not share this medicine with others. What if I miss a dose? It is important not to miss your dose. Call your doctor or health care professional if you are unable to keep an appointment. What may interact with this medicine? Do not take this medicine with any of the following medications: -clozapine This medicine may also interact with the following medications: -atazanavir -cimetidine -ciprofloxacin -enoxacin -fluvoxamine -medicines for seizures like carbamazepine and phenobarbital -mexiletine -rifampin -tacrine -thiabendazole -zileuton This list may not describe all possible interactions. Give your health care provider a list of all the medicines, herbs, non-prescription drugs, or dietary supplements you use. Also tell them if you smoke, drink alcohol, or use illegal drugs. Some items may interact with your medicine. What should I watch for while using this medicine? This drug may make you feel generally unwell. This is not uncommon, as chemotherapy can affect healthy cells as well as cancer cells. Report any side effects. Continue your course of treatment even though you feel ill unless your doctor tells you to stop. You may need blood work done while you are taking this medicine. Call your doctor or health care professional for advice if you get a fever, chills or sore throat, or other symptoms of a cold or flu. Do not treat yourself. This drug decreases your body's ability to fight infections. Try  to avoid being around people who are sick. This medicine may increase your risk to bruise or bleed. Call your doctor or health care professional if you notice any  unusual bleeding. Talk to your doctor about your risk of cancer. You may be more at risk for certain types of cancers if you take this medicine. Do not become pregnant while taking this medicine or for 3 months after stopping it. Women should inform their doctor if they wish to become pregnant or think they might be pregnant. Men should not father a child while taking this medicine and for 3 months after stopping it.There is a potential for serious side effects to an unborn child. Talk to your health care professional or pharmacist for more information. Do not breast-feed an infant while taking this medicine. This medicine may interfere with the ability to have a child. You should talk with your doctor or health care professional if you are concerned about your fertility. What side effects may I notice from receiving this medicine? Side effects that you should report to your doctor or health care professional as soon as possible: -allergic reactions like skin rash, itching or hives, swelling of the face, lips, or tongue -low blood counts - this medicine may decrease the number of white blood cells, red blood cells and platelets. You may be at increased risk for infections and bleeding. -redness, blistering, peeling or loosening of the skin, including inside the mouth -signs of infection - fever or chills, cough, sore throat, pain or difficulty passing urine -signs of decreased platelets or bleeding - bruising, pinpoint red spots on the skin, black, tarry stools, blood in the urine -signs of decreased red blood cells - unusually weak or tired, fainting spells, lightheadedness -signs and symptoms of kidney injury like trouble passing urine or change in the amount of urine -signs and symptoms of liver injury like dark yellow or brown urine; general ill feeling or flu-like symptoms; light-colored stools; loss of appetite; nausea; right upper belly pain; unusually weak or tired; yellowing of the eyes or  skin Side effects that usually do not require medical attention (report to your doctor or health care professional if they continue or are bothersome): -constipation -decreased appetite -diarrhea -headache -mouth sores -nausea/vomiting -tiredness This list may not describe all possible side effects. Call your doctor for medical advice about side effects. You may report side effects to FDA at 1-800-FDA-1088. Where should I keep my medicine? This drug is given in a hospital or clinic and will not be stored at home. NOTE: This sheet is a summary. It may not cover all possible information. If you have questions about this medicine, talk to your doctor, pharmacist, or health care provider.  2018 Elsevier/Gold Standard (2015-08-17 08:45:41)  Rituximab injection What is this medicine? RITUXIMAB (ri TUX i mab) is a monoclonal antibody. It is used to treat certain types of cancer like non-Hodgkin lymphoma and chronic lymphocytic leukemia. It is also used to treat rheumatoid arthritis, granulomatosis with polyangiitis (or Wegener's granulomatosis), and microscopic polyangiitis. This medicine may be used for other purposes; ask your health care provider or pharmacist if you have questions. COMMON BRAND NAME(S): Rituxan What should I tell my health care provider before I take this medicine? They need to know if you have any of these conditions: -heart disease -infection (especially a virus infection such as hepatitis B, chickenpox, cold sores, or herpes) -immune system problems -irregular heartbeat -kidney disease -lung or breathing disease, like asthma -recently received or scheduled to  receive a vaccine -an unusual or allergic reaction to rituximab, mouse proteins, other medicines, foods, dyes, or preservatives -pregnant or trying to get pregnant -breast-feeding How should I use this medicine? This medicine is for infusion into a vein. It is administered in a hospital or clinic by a specially  trained health care professional. A special MedGuide will be given to you by the pharmacist with each prescription and refill. Be sure to read this information carefully each time. Talk to your pediatrician regarding the use of this medicine in children. This medicine is not approved for use in children. Overdosage: If you think you have taken too much of this medicine contact a poison control center or emergency room at once. NOTE: This medicine is only for you. Do not share this medicine with others. What if I miss a dose? It is important not to miss a dose. Call your doctor or health care professional if you are unable to keep an appointment. What may interact with this medicine? -cisplatin -other medicines for arthritis like disease modifying antirheumatic drugs or tumor necrosis factor inhibitors -live virus vaccines This list may not describe all possible interactions. Give your health care provider a list of all the medicines, herbs, non-prescription drugs, or dietary supplements you use. Also tell them if you smoke, drink alcohol, or use illegal drugs. Some items may interact with your medicine. What should I watch for while using this medicine? Your condition will be monitored carefully while you are receiving this medicine. You may need blood work done while you are taking this medicine. This medicine can cause serious allergic reactions. To reduce your risk you may need to take medicine before treatment with this medicine. Take your medicine as directed. In some patients, this medicine may cause a serious brain infection that may cause death. If you have any problems seeing, thinking, speaking, walking, or standing, tell your doctor right away. If you cannot reach your doctor, urgently seek other source of medical care. Call your doctor or health care professional for advice if you get a fever, chills or sore throat, or other symptoms of a cold or flu. Do not treat yourself. This drug  decreases your body's ability to fight infections. Try to avoid being around people who are sick. Do not become pregnant while taking this medicine or for 12 months after stopping it. Women should inform their doctor if they wish to become pregnant or think they might be pregnant. There is a potential for serious side effects to an unborn child. Talk to your health care professional or pharmacist for more information. What side effects may I notice from receiving this medicine? Side effects that you should report to your doctor or health care professional as soon as possible: -breathing problems -chest pain -dizziness or feeling faint -fast, irregular heartbeat -low blood counts - this medicine may decrease the number of white blood cells, red blood cells and platelets. You may be at increased risk for infections and bleeding. -mouth sores -redness, blistering, peeling or loosening of the skin, including inside the mouth (this can be added for any serious or exfoliative rash that could lead to hospitalization) -signs of infection - fever or chills, cough, sore throat, pain or difficulty passing urine -signs and symptoms of kidney injury like trouble passing urine or change in the amount of urine -signs and symptoms of liver injury like dark yellow or brown urine; general ill feeling or flu-like symptoms; light-colored stools; loss of appetite; nausea; right upper belly pain;  unusually weak or tired; yellowing of the eyes or skin -stomach pain -vomiting Side effects that usually do not require medical attention (report to your doctor or health care professional if they continue or are bothersome): -headache -joint pain -muscle cramps or muscle pain This list may not describe all possible side effects. Call your doctor for medical advice about side effects. You may report side effects to FDA at 1-800-FDA-1088. Where should I keep my medicine? This drug is given in a hospital or clinic and will  not be stored at home. NOTE: This sheet is a summary. It may not cover all possible information. If you have questions about this medicine, talk to your doctor, pharmacist, or health care provider.  2018 Elsevier/Gold Standard (2016-05-22 15:28:09)

## 2017-03-06 ENCOUNTER — Ambulatory Visit (HOSPITAL_BASED_OUTPATIENT_CLINIC_OR_DEPARTMENT_OTHER): Payer: Medicare Other

## 2017-03-06 VITALS — BP 120/55 | HR 51 | Temp 97.9°F | Resp 16

## 2017-03-06 DIAGNOSIS — Z5111 Encounter for antineoplastic chemotherapy: Secondary | ICD-10-CM | POA: Diagnosis present

## 2017-03-06 DIAGNOSIS — C8318 Mantle cell lymphoma, lymph nodes of multiple sites: Secondary | ICD-10-CM

## 2017-03-06 MED ORDER — SODIUM CHLORIDE 0.9 % IV SOLN
75.0000 mg/m2 | Freq: Once | INTRAVENOUS | Status: AC
  Administered 2017-03-06: 150 mg via INTRAVENOUS
  Filled 2017-03-06: qty 30

## 2017-03-06 MED ORDER — SODIUM CHLORIDE 0.9 % IV SOLN
Freq: Once | INTRAVENOUS | Status: AC
  Administered 2017-03-06: 15:00:00 via INTRAVENOUS

## 2017-03-06 MED ORDER — DEXAMETHASONE SODIUM PHOSPHATE 10 MG/ML IJ SOLN
INTRAMUSCULAR | Status: AC
  Filled 2017-03-06: qty 1

## 2017-03-06 MED ORDER — DEXAMETHASONE SODIUM PHOSPHATE 10 MG/ML IJ SOLN
10.0000 mg | Freq: Once | INTRAMUSCULAR | Status: AC
  Administered 2017-03-06: 10 mg via INTRAVENOUS

## 2017-03-06 MED ORDER — PEGFILGRASTIM 6 MG/0.6ML ~~LOC~~ PSKT
6.0000 mg | PREFILLED_SYRINGE | Freq: Once | SUBCUTANEOUS | Status: DC
  Filled 2017-03-06: qty 0.6

## 2017-03-06 NOTE — Patient Instructions (Signed)
[facility] Discharge Instructions for Patients Receiving Chemotherapy  Today you received the following chemotherapy agents bendamustine (Treanda).  To help prevent nausea and vomiting after your treatment, we encourage you to take your nausea medication as directed by your doctor.   If you develop nausea and vomiting that is not controlled by your nausea medication, call the clinic.   BELOW ARE SYMPTOMS THAT SHOULD BE REPORTED IMMEDIATELY:  *FEVER GREATER THAN 100.5 F  *CHILLS WITH OR WITHOUT FEVER  NAUSEA AND VOMITING THAT IS NOT CONTROLLED WITH YOUR NAUSEA MEDICATION  *UNUSUAL SHORTNESS OF BREATH  *UNUSUAL BRUISING OR BLEEDING  TENDERNESS IN MOUTH AND THROAT WITH OR WITHOUT PRESENCE OF ULCERS  *URINARY PROBLEMS  *BOWEL PROBLEMS  UNUSUAL RASH Items with * indicate a potential emergency and should be followed up as soon as possible.  Feel free to call the clinic you have any questions or concerns. The clinic phone number is [phone].  Please show the [facility] at check-in to the Emergency Department and triage nurse.

## 2017-03-08 ENCOUNTER — Ambulatory Visit (HOSPITAL_BASED_OUTPATIENT_CLINIC_OR_DEPARTMENT_OTHER): Payer: Medicare Other

## 2017-03-08 ENCOUNTER — Telehealth: Payer: Self-pay | Admitting: *Deleted

## 2017-03-08 VITALS — BP 133/61 | HR 45 | Temp 98.6°F | Resp 18

## 2017-03-08 DIAGNOSIS — C8318 Mantle cell lymphoma, lymph nodes of multiple sites: Secondary | ICD-10-CM | POA: Diagnosis present

## 2017-03-08 DIAGNOSIS — Z5189 Encounter for other specified aftercare: Secondary | ICD-10-CM

## 2017-03-08 MED ORDER — PEGFILGRASTIM INJECTION 6 MG/0.6ML ~~LOC~~
6.0000 mg | PREFILLED_SYRINGE | Freq: Once | SUBCUTANEOUS | Status: AC
  Administered 2017-03-08: 6 mg via SUBCUTANEOUS

## 2017-03-08 NOTE — Progress Notes (Signed)
Discharged, ambulating well in no distress.

## 2017-03-08 NOTE — Telephone Encounter (Signed)
[name]. here today for Neulasta injection.  This nurse discussed chemotherapy F/U S/P Rituxan, Treanda.  Patient is doing well.  Denies n/v.  Denies any new side effects or symptoms.  Bowel and bladder is functioning well.  Eating and drinking well and I instructed to drink 64 oz minimum daily or at least the day before, of and after treatment.  Reports trouble sleeping.  Denies pain or any reason contributing sleep except "Worrying about whether I'll receive this injection."  Reports history of pain to tail bone.  Denies fall or  Injury.  Reports he's taken tylenol before today's injection as this nurse educating SE of Neulasta.  Denies questions at this time and encouraged to call if needed.  Reviewed how to call after hours in the case of an emergency.

## 2017-03-08 NOTE — Patient Instructions (Signed)
Pegfilgrastim injection What is this medicine? PEGFILGRASTIM (PEG fil gra stim) is a long-acting granulocyte colony-stimulating factor that stimulates the growth of neutrophils, a type of white blood cell important in the body's fight against infection. It is used to reduce the incidence of fever and infection in patients with certain types of cancer who are receiving chemotherapy that affects the bone marrow, and to increase survival after being exposed to high doses of radiation. This medicine may be used for other purposes; ask your health care provider or pharmacist if you have questions. COMMON BRAND NAME(S): Neulasta What should I tell my health care provider before I take this medicine? They need to know if you have any of these conditions: -kidney disease -latex allergy -ongoing radiation therapy -sickle cell disease -skin reactions to acrylic adhesives (On-Body Injector only) -an unusual or allergic reaction to pegfilgrastim, filgrastim, other medicines, foods, dyes, or preservatives -pregnant or trying to get pregnant -breast-feeding How should I use this medicine? This medicine is for injection under the skin. If you get this medicine at home, you will be taught how to prepare and give the pre-filled syringe or how to use the On-body Injector. Refer to the patient Instructions for Use for detailed instructions. Use exactly as directed. Tell your healthcare provider immediately if you suspect that the On-body Injector may not have performed as intended or if you suspect the use of the On-body Injector resulted in a missed or partial dose. It is important that you put your used needles and syringes in a special sharps container. Do not put them in a trash can. If you do not have a sharps container, call your pharmacist or healthcare provider to get one. Talk to your pediatrician regarding the use of this medicine in children. While this drug may be prescribed for selected conditions,  precautions do apply. Overdosage: If you think you have taken too much of this medicine contact a poison control center or emergency room at once. NOTE: This medicine is only for you. Do not share this medicine with others. What if I miss a dose? It is important not to miss your dose. Call your doctor or health care professional if you miss your dose. If you miss a dose due to an On-body Injector failure or leakage, a new dose should be administered as soon as possible using a single prefilled syringe for manual use. What may interact with this medicine? Interactions have not been studied. Give your health care provider a list of all the medicines, herbs, non-prescription drugs, or dietary supplements you use. Also tell them if you smoke, drink alcohol, or use illegal drugs. Some items may interact with your medicine. This list may not describe all possible interactions. Give your health care provider a list of all the medicines, herbs, non-prescription drugs, or dietary supplements you use. Also tell them if you smoke, drink alcohol, or use illegal drugs. Some items may interact with your medicine. What should I watch for while using this medicine? You may need blood work done while you are taking this medicine. If you are going to need a MRI, CT scan, or other procedure, tell your doctor that you are using this medicine (On-Body Injector only). What side effects may I notice from receiving this medicine? Side effects that you should report to your doctor or health care professional as soon as possible: -allergic reactions like skin rash, itching or hives, swelling of the face, lips, or tongue -dizziness -fever -pain, redness, or irritation at site   where injected -pinpoint red spots on the skin -red or dark-brown urine -shortness of breath or breathing problems -stomach or side pain, or pain at the shoulder -swelling -tiredness -trouble passing urine or change in the amount of urine Side  effects that usually do not require medical attention (report to your doctor or health care professional if they continue or are bothersome): -bone pain -muscle pain This list may not describe all possible side effects. Call your doctor for medical advice about side effects. You may report side effects to FDA at 1-800-FDA-1088. Where should I keep my medicine? Keep out of the reach of children. Store pre-filled syringes in a refrigerator between 2 and 8 degrees C (36 and 46 degrees F). Do not freeze. Keep in carton to protect from light. Throw away this medicine if it is left out of the refrigerator for more than 48 hours. Throw away any unused medicine after the expiration date. NOTE: This sheet is a summary. It may not cover all possible information. If you have questions about this medicine, talk to your doctor, pharmacist, or health care provider.  2018 Elsevier/Gold Standard (2016-10-10 12:58:03)  

## 2017-03-11 ENCOUNTER — Encounter (HOSPITAL_COMMUNITY): Payer: Self-pay

## 2017-03-11 NOTE — Telephone Encounter (Signed)
Talked to pt -still not feeling well.He said he will call us back to schedule his awv when he is feeling better.

## 2017-03-13 ENCOUNTER — Telehealth: Payer: Self-pay | Admitting: Hematology

## 2017-03-13 ENCOUNTER — Encounter: Payer: Self-pay | Admitting: Hematology

## 2017-03-13 ENCOUNTER — Other Ambulatory Visit (HOSPITAL_COMMUNITY)
Admission: RE | Admit: 2017-03-13 | Discharge: 2017-03-13 | Disposition: A | Discharge status: Home / Self Care | Payer: Medicare Other | Source: Ambulatory Visit | Attending: Hematology | Admitting: Hematology

## 2017-03-13 ENCOUNTER — Other Ambulatory Visit (HOSPITAL_BASED_OUTPATIENT_CLINIC_OR_DEPARTMENT_OTHER): Payer: Medicare Other

## 2017-03-13 ENCOUNTER — Ambulatory Visit (HOSPITAL_BASED_OUTPATIENT_CLINIC_OR_DEPARTMENT_OTHER): Payer: Medicare Other | Admitting: Hematology

## 2017-03-13 VITALS — BP 131/57 | HR 56 | Temp 98.1°F | Resp 18 | Ht 72.0 in | Wt 178.7 lb

## 2017-03-13 DIAGNOSIS — E870 Hyperosmolality and hypernatremia: Secondary | ICD-10-CM

## 2017-03-13 DIAGNOSIS — C8318 Mantle cell lymphoma, lymph nodes of multiple sites: Secondary | ICD-10-CM

## 2017-03-13 DIAGNOSIS — E43 Unspecified severe protein-calorie malnutrition: Secondary | ICD-10-CM | POA: Diagnosis not present

## 2017-03-13 DIAGNOSIS — R609 Edema, unspecified: Secondary | ICD-10-CM | POA: Diagnosis not present

## 2017-03-13 DIAGNOSIS — R7989 Other specified abnormal findings of blood chemistry: Secondary | ICD-10-CM

## 2017-03-13 DIAGNOSIS — C8289 Other types of follicular lymphoma, extranodal and solid organ sites: Secondary | ICD-10-CM | POA: Diagnosis not present

## 2017-03-13 DIAGNOSIS — M7989 Other specified soft tissue disorders: Secondary | ICD-10-CM

## 2017-03-13 DIAGNOSIS — K625 Hemorrhage of anus and rectum: Secondary | ICD-10-CM

## 2017-03-13 DIAGNOSIS — R197 Diarrhea, unspecified: Secondary | ICD-10-CM | POA: Diagnosis not present

## 2017-03-13 DIAGNOSIS — C8299 Follicular lymphoma, unspecified, extranodal and solid organ sites: Secondary | ICD-10-CM

## 2017-03-13 LAB — CBC WITH DIFFERENTIAL/PLATELET
BASO%: 0.4 % (ref 0.0–2.0)
Basophils Absolute: 0.1 10*3/uL (ref 0.0–0.1)
EOS ABS: 0.3 10*3/uL (ref 0.0–0.5)
EOS%: 1.3 % (ref 0.0–7.0)
HEMATOCRIT: 39.2 % (ref 38.4–49.9)
HEMOGLOBIN: 12.7 g/dL — AB (ref 13.0–17.1)
LYMPH#: 0.7 10*3/uL — AB (ref 0.9–3.3)
LYMPH%: 3.5 % — ABNORMAL LOW (ref 14.0–49.0)
MCH: 30.6 pg (ref 27.2–33.4)
MCHC: 32.5 g/dL (ref 32.0–36.0)
MCV: 94.2 fL (ref 79.3–98.0)
MONO#: 1.4 10*3/uL — ABNORMAL HIGH (ref 0.1–0.9)
MONO%: 7.4 % (ref 0.0–14.0)
NEUT%: 87.4 % — ABNORMAL HIGH (ref 39.0–75.0)
NEUTROS ABS: 16.9 10*3/uL — AB (ref 1.5–6.5)
Platelets: 172 10*3/uL (ref 140–400)
RBC: 4.16 10*6/uL — AB (ref 4.20–5.82)
RDW: 17.4 % — AB (ref 11.0–14.6)
WBC: 19.3 10*3/uL — AB (ref 4.0–10.3)

## 2017-03-13 LAB — COMPREHENSIVE METABOLIC PANEL
ALK PHOS: 129 U/L (ref 40–150)
ALT: 13 U/L (ref 0–55)
AST: 15 U/L (ref 5–34)
Albumin: 2.5 g/dL — ABNORMAL LOW (ref 3.5–5.0)
Anion Gap: 8 mEq/L (ref 3–11)
BUN: 14.2 mg/dL (ref 7.0–26.0)
CO2: 28 meq/L (ref 22–29)
Calcium: 8.1 mg/dL — ABNORMAL LOW (ref 8.4–10.4)
Chloride: 110 mEq/L — ABNORMAL HIGH (ref 98–109)
Creatinine: 1.6 mg/dL — ABNORMAL HIGH (ref 0.7–1.3)
EGFR: 40 mL/min/{1.73_m2} — AB (ref >90)
GLUCOSE: 101 mg/dL (ref 70–140)
POTASSIUM: 3.5 meq/L (ref 3.5–5.1)
SODIUM: 146 meq/L — AB (ref 136–145)
Total Bilirubin: 0.7 mg/dL (ref 0.20–1.20)
Total Protein: 4.9 g/dL — ABNORMAL LOW (ref 6.4–8.3)

## 2017-03-13 LAB — PHOSPHORUS: PHOSPHORUS: 3.5 mg/dL (ref 2.5–4.6)

## 2017-03-13 LAB — URIC ACID: URIC ACID, SERUM: 5.8 mg/dL (ref 2.6–7.4)

## 2017-03-13 MED ORDER — POTASSIUM CHLORIDE CRYS ER 20 MEQ PO TBCR: 20.0000 meq | EXTENDED_RELEASE_TABLET | Freq: Every day | ORAL | 1 refills | Status: DC

## 2017-03-13 MED ORDER — FUROSEMIDE 20 MG PO TABS: 20.0000 mg | ORAL_TABLET | Freq: Every day | ORAL | 1 refills | Status: DC

## 2017-03-13 NOTE — Telephone Encounter (Signed)
Gave relative avs report and appointments for June.  °

## 2017-03-13 NOTE — Progress Notes (Signed)
[name] Kitchen    HEMATOLOGY/ONCOLOGY Clinic NOTE  Date of Service: 03/13/2017  Patient Care Team: Jacob Post, MD as PCP - General (Family Medicine)  CHIEF COMPLAINTS/PURPOSE OF CONSULTATION:  Newly diagnosed mantle cell lymphoma  HISTORY OF PRESENTING ILLNESS:   [name]. is a wonderful 83 y.o. male who is transferring care to me for continued evaluation and management of newly diagnosed Mantle cell lymphoma.  Patient is a very pleasant 83 year old with a history of hypertension, dyslipidemia, psoriasis, fibromyalgia, coronary artery disease status [name] PCI in 2009, GERD who presented with episodic rectal bleeding and weight loss of about 40 pounds since November 2017. His daughter who is a Marine scientist and is present at this visit notes that his weight has dropped from 216 down to 175 pounds. He has also been having bothersome diarrhea that has significantly affected his quality of life.  Patient was evaluated by GI and had a colonoscopy in 12/30/2016 in which his terminal ileum was moderately inflamed and ulcerated, biopsies were taken with cold forceps, 2 sessile polyps were found in the ascending colon, the rectosigmoid region was moderately inflamed, biopsies were taken exam otherwise was normal underdirect retroflexion views. A clinical diagnosis has been made as ileocolitis by the gastroenterologist  However pathologist has reported,small bowel mucosa with atypical lymphoid infiltrates and scattered active inflammation.  A right colon biopsy also showed colonic mucosa with atypical lymphoid infiltrates and scattered active inflammatory cells.  Surgical biopsy of the ascending colon polyp wasdocumented as tubular adenomawith the atypical lymphoid infiltrates.   Left colon biopsy also has revealed atypical lymphoid infiltrates with scattered inflammatory cells. Biopsy of the rectal mucosa has revealed atypical lymphoid infiltrates and active inflammatory cells without  any dysplasia.   Microscopic examination with special stains has revealed infiltrates positive for CD20 cells, positive for BCL 6 and BCL-2 with high Ki 67 at 50% however FISH studies showed no evidence of BCL 6 or BCL-2 disruption. Cells were noted to be cyclin D1 positive. [place] for t(11;14) was not noted to be negative however in tumor Board the pathologist noted that this was likely due to limited sampling. The consensus from pathology was this was consistent overall with a mantle cell lymphoma.  Patient subsequently had a bone marrow biopsy which appeared consistent with mantle cell lymphoma.  PET/CT scan was done on 01/30/2017 and showed scattered hypermetabolic lymphadenopathy in the left neck mediastinum and hilum and right lower quadrant of the abdomen. Possible right colon lesion.  Patient along with his daughter who is an Therapist, sports and his wife are here to discuss treatment options. Patient notes he is very fatigued and is losing weight and is hoping to get started as soon as possible on treatment.  Daughter notes that he has been functioning quite well prior to the diarrhea and weight loss and was able to function independently. Patient notes that the diarrhea is bothering him the most and he has a fair amount of urgency.  No issues with urination.  Notes some chills and night sweats. Notes that he is following with Dr. Ardis Hughs and was on Canasa and hydrocortisone enemas.  After his weight loss he notes his blood pressure has been running somewhat low and this has led to a significant decrease in his blood pressure medications.  We discussed treatment options in details and informed consent was obtained to proceed with bendamustine and Rituxan chemo-immunotherapy .  INTERVAL HISTORY  [name] appears more energetic today and notes that his diarrhea has significantly improved.  Minimal hemorrhoidal bleeding. Hemoglobin stable. Increase WBC count due to Neulasta. His Maxzide was  discontinued by his cardiologist. He has developed some leg swelling and is okay with starting him on Lasix which was done. he notes that the swelling goes down overnight and appears to be dependent edema. In addition to starting Lasix we recommended he can on his salt intake and use compression socks-prescription given. No rashes. Minimal nausea controlled with oral medications. No other prohibitive toxicities at this time.   MEDICAL HISTORY:  Past Medical History:  Diagnosis Date  . Coronary artery disease    a. stent (promus) Cx/OM'09  . Fibromyalgia dx'd ~ 04/2015  . GERD (gastroesophageal reflux disease)   . Gout   . HEARING LOSS    "temporary"  . Hyperlipidemia   . HYPERLIPIDEMIA 10/13/2007  . Hypertension   . HYPERTENSION 10/13/2007  . Osteoarthritis    "left shoulder" (08/23/2015)  . Prostatitis, acute   . PROSTATITIS, ACUTE, HX OF 10/13/2007  . PSORIASIS 10/13/2007  . PVC (premature ventricular contraction)    hx  . SKIN CANCER, RECURRENT 10/13/2007    SURGICAL HISTORY: Past Surgical History:  Procedure Laterality Date  . APPENDECTOMY    . CORONARY ANGIOPLASTY WITH STENT PLACEMENT    . CYST EXCISION Right X 2   shoulder  . ELECTROLYSIS OF MISDIRECTED LASHES Bilateral    eyelashes are misdirected and grow inwards   . EYE SURGERY    . MASS EXCISION Right 01/2005   proximal thigh soft tissue mass  . TEAR DUCT PROBING Left   . TYMPANOPLASTY Bilateral    "for hearing loss; put tubes in also, 2X on right, 1X on the left; tubes worked"  . VASECTOMY      SOCIAL HISTORY: Social History   Social History  . Marital status: Married    Spouse name: N/A  . Number of children: 3  . Years of education: N/A   Occupational History  . retired Retired    from [employer].   Social History Main Topics  . Smoking status: Never Smoker  . Smokeless tobacco: Never Used  . Alcohol use No  . Drug use: No  . Sexual activity: No   Other Topics Concern  . Not on file   Social  History Narrative   Married 1958   2 daughters- '59, 68, 1 son '61   8 grandchildren   Retired from [employer], works PT as a Curator. Now retired completely (09/2008)   End of life; does not want heroic measures if in a persistent vegative state    FAMILY HISTORY: Family History  Problem Relation Age of Onset  . Heart attack Father 29       died  . Heart disease Father   . Parkinsonism Mother 11       died  . Breast cancer Sister        died  . Lung cancer Unknown        uncle died  . Colon cancer Neg Hx     ALLERGIES:  is allergic to niacin.  MEDICATIONS:  Current Outpatient Prescriptions  Medication Sig Dispense Refill  . allopurinol (ZYLOPRIM) 100 MG tablet Take 2 tablets (200 mg total) by mouth daily. 60 tablet 0  . atenolol (TENORMIN) 25 MG tablet TAKE ONE-HALF TABLET BY MOUTH ONCE DAILY. 45 tablet 3  . dexamethasone (DECADRON) 4 MG tablet Take 2 tablets (8 mg total) by mouth daily. Start the day after bendamustine chemotherapy for 2 days. Take with food.  30 tablet 1  . famotidine (PEPCID) 20 MG tablet Take 1 tablet (20 mg total) by mouth 2 (two) times daily. (Patient not taking: Reported on 03/03/2017) 60 tablet 0  . hydrocortisone (ANUSOL-HC) 2.5 % rectal cream Place rectally 3 (three) times daily. 30 g 0  . hydrocortisone (ANUSOL-HC) 25 MG suppository Place 1 suppository (25 mg total) rectally 2 (two) times daily. 12 suppository 0  . lisinopril (PRINIVIL,ZESTRIL) 2.5 MG tablet Take 1 tablet (2.5 mg total) by mouth daily. 90 tablet 3  . mesalamine (CANASA) 1000 MG suppository Place 1 suppository (1,000 mg total) rectally at bedtime. 30 suppository 6  . nitroGLYCERIN (NITROSTAT) 0.4 MG SL tablet DISSOLVE ONE TABLET UNDER THE TONGUE EVERY 5 MINUTES AS NEEDED 25 tablet 12  . ondansetron (ZOFRAN) 8 MG tablet Take 1 tablet (8 mg total) by mouth 2 (two) times daily as needed for refractory nausea / vomiting. Start on day 2 after bendamustine chemo. 30 tablet 1  . pantoprazole  (PROTONIX) 40 MG tablet Take 1 tablet (40 mg total) by mouth daily. 30 tablet 0  . prochlorperazine (COMPAZINE) 10 MG tablet Take 1 tablet (10 mg total) by mouth every 6 (six) hours as needed (Nausea or vomiting). 30 tablet 1  . rosuvastatin (CRESTOR) 10 MG tablet Take one tablet by mouth daily prior to bedtime 90 tablet 3  . triamterene-hydrochlorothiazide (MAXZIDE-25) 37.5-25 MG tablet Take 0.5 tablets by mouth daily. (Patient not taking: Reported on 03/03/2017)     Current Facility-Administered Medications  Medication Dose Route Frequency Provider Last Rate Last Dose  . 0.9 %  sodium chloride infusion  500 mL Intravenous Continuous Milus Banister, MD        REVIEW OF SYSTEMS:    10 Point review of Systems was done is negative except as noted above.  PHYSICAL EXAMINATION: ECOG PERFORMANCE STATUS: 2 - Symptomatic, <50% confined to bed  . Vitals:   03/13/17 0843  BP: (!) 131/57  Pulse: (!) 56  Resp: 18  Temp: 98.1 F (36.7 C)   Filed Weights   03/13/17 0843  Weight: 178 lb 11.2 oz (81.1 kg)   .Body mass index is 24.24 kg/m.  GENERAL:alert, in no acute distress and comfortable SKIN: no acute rashes, no significant lesions EYES: conjunctiva are pink and non-injected, sclera anicteric OROPHARYNX: MMM, no exudates, no oropharyngeal erythema or ulceration NECK: supple, no JVD LYMPH:  Small palpable lymph nodes in the left neck , no palpable axillary or inguinal lymphadenopathy  LUNGS: clear to auscultation b/l with normal respiratory effort HEART: regular rate & rhythm ABDOMEN:  normoactive bowel sounds , non tender, not distended. Extremity: 2+ pitting pedal edema bilaterally PSYCH: alert & oriented x 3 with fluent speech NEURO: no focal motor/sensory deficits  LABORATORY DATA:  I have reviewed the data as listed  . CBC Latest Ref Rng & Units 03/13/2017 03/05/2017 02/20/2017  WBC 4.0 - 10.3 10e3/uL 19.3(H) 8.2 7.7  Hemoglobin 13.0 - 17.1 g/dL 12.7(L) 13.3 13.7  Hematocrit  38.4 - 49.9 % 39.2 40.1 42.3  Platelets 140 - 400 10e3/uL 172 295 318    . CMP Latest Ref Rng & Units 03/13/2017 03/05/2017 02/20/2017  Glucose 70 - 140 mg/dl 101 97 115  BUN 7.0 - 26.0 mg/dL 14.2 12.7 13.4  Creatinine 0.7 - 1.3 mg/dL 1.6(H) 0.9 1.1  Sodium 136 - 145 mEq/L 146(H) 144 138  Potassium 3.5 - 5.1 mEq/L 3.5 3.6 4.1  Chloride 101 - 111 mmol/L - - -  CO2 22 - 29  mEq/L '28 27 24  '$ Calcium 8.4 - 10.4 mg/dL 8.1(L) 8.3(L) 8.8  Total Protein 6.4 - 8.3 g/dL 4.9(L) 5.3(L) 6.4  Total Bilirubin 0.20 - 1.20 mg/dL 0.70 0.54 0.50  Alkaline Phos 40 - 150 U/L 129 59 80  AST 5 - 34 U/L '15 15 24  '$ ALT 0 - 55 U/L '13 18 18   '$ . Lab Results  Component Value Date   LDH 164 02/20/2017   Uric acid 5.8           RADIOGRAPHIC STUDIES: I have personally reviewed the radiological images as listed and agreed with the findings in the report. No results found.  ASSESSMENT & PLAN:  83 year old male with  #1 Newly diagnosed stage IVBE Mantle cell lymphoma He has hypermetabolic lymphadenopathy and extensive bowel involvement. Has significant constitutional symptoms including debilitating fatigue , weight loss of about 40 pounds since November 2017, anorexia, some subjective chills.  #2 persistent bothersome diarrhea and some rectal bleeding. Hemoglobin is stable at this time. This appears to be likely related to his mantle cell lymphoma. Could also have an underlying intermittent disorder. Is on Canasa and hydrocortisone suppositories as per GI. Patient's diarrhea has significantly improved. Only minimal hemorrhoidal bleeding at this time.  #3 moderate to severe protein calorie malnutrition with significant weight loss- due to lymphoma and diarrhea. Slowly improving nutritional status with still hypoalbuminemic  #4 low normal blood pressure. Blood pressure is improving especially with his diarrhea having resolved with decreased fluid losses .  #5 bilateral lower extremity swelling likely  third spacing due to his hypoalbuminemia and the fact that he was taken off diuretics and with some steroids he got with chemotherapy.  #6 mild bump in creatinine. No overt evidence of tumor lysis syndrome with normal potassium and uric acid levels. With hypernatremia suggests some element of intravascular deficits with increased total body water. No symptoms suggestive up obstructive uropathy. Plan -Labs relatively stable. We'll need to monitor creatinine. -May cutdowns allopurinol 100 mg by mouth daily especially since he has been having some nausea after taking the medication. We shall discontinue this if labs look stable prior to his second cycle and there is no evidence of tumor lysis. -Continue follow-up with gastroenterology for management of possible inflammatory bowel disease. -Dietitian follow-up to optimize nutritional status -We'll start the patient on low-dose Lasix 20 mg daily with potassium. -Given prescription for compression socks for his leg swelling. -Encouraged him to maintain good nutrition, good fluid intake and walk at least 20-30 minutes a day. -We will discuss referral to cancer rehabilitation on next appointment.  -continue Bendamustine-Rituxan treatment as per schedule next cycle 6/6 -RTC with Dr Irene Limbo with labs C2D1  All of the patients questions were answered with apparent satisfaction. The patient knows to call the clinic with any problems, questions or concerns.  I spent 30 minutes counseling the patient face to face. The total time spent in the appointment was 40 minutes and more than 50% was on counseling and direct patient cares.    Sullivan Lone MD [place] AAHIVMS [facility] Hematology/Oncology Physician [facility]  (Office):       [phone] (Work cell):  [phone] (Fax):           [phone]  03/13/2017 8:46 AM

## 2017-03-13 NOTE — Patient Instructions (Signed)
Thank you for choosing [facility] to provide your oncology and hematology care.  To afford each patient quality time with our providers, please arrive 30 minutes before your scheduled appointment time.  If you arrive late for your appointment, you may be asked to reschedule.  We strive to give you quality time with our providers, and arriving late affects you and other patients whose appointments are after yours.  If you are a no show for multiple scheduled visits, you may be dismissed from the clinic at the providers discretion.   Again, thank you for choosing [facility], our hope is that these requests will decrease the amount of time that you wait before being seen by our physicians.  ______________________________________________________________________ Should you have questions after your visit to the [facility], please contact our office at [phone] between the hours of 8:30 and 4:30 p.m.    Voicemails left after 4:30p.m will not be returned until the following business day.   For prescription refill requests, please have your pharmacy contact us directly.  Please also try to allow 48 hours for prescription requests.   Please contact the scheduling department for questions regarding scheduling.  For scheduling of procedures such as PET scans, CT scans, MRI, Ultrasound, etc please contact central scheduling at [phone].   Resources For Cancer Patients and Caregivers:  American Cancer Society:  800-227-2345  Can help patients locate various types of support and financial assistance Cancer Care: 1-800-813-HOPE (4673) Provides financial assistance, online support groups, medication/co-pay assistance.   [place] DSS:  336-641-3447 Where to apply for food stamps, Medicaid, and utility assistance Medicare Rights Center: 800-333-4114 Helps people with Medicare understand their rights and benefits, navigate the Medicare system, and secure the  quality healthcare they deserve SCAT: 336-333-6589 [place] [employer]'s shared-ride transportation service for eligible riders who have a disability that prevents them from riding the fixed route bus.   For additional information on assistance programs please contact our social worker:   Grier Hock/Abigail Elmore:  [phone] 

## 2017-03-14 LAB — HEPATITIS B CORE ANTIBODY, TOTAL: Hep B Core Ab, Tot: NEGATIVE

## 2017-03-14 LAB — HEPATITIS B SURFACE ANTIGEN: HBsAg Screen: NEGATIVE

## 2017-03-17 ENCOUNTER — Emergency Department (HOSPITAL_COMMUNITY): Payer: Medicare Other

## 2017-03-17 ENCOUNTER — Encounter (HOSPITAL_COMMUNITY): Payer: Self-pay | Admitting: Emergency Medicine

## 2017-03-17 ENCOUNTER — Inpatient Hospital Stay (HOSPITAL_COMMUNITY)
Admission: EM | Admit: 2017-03-17 | Discharge: 2017-03-20 | DRG: 682 | Disposition: A | Discharge status: Home / Self Care | Payer: Medicare Other | Attending: Internal Medicine | Admitting: Internal Medicine

## 2017-03-17 ENCOUNTER — Other Ambulatory Visit: Payer: Self-pay

## 2017-03-17 DIAGNOSIS — I5042 Chronic combined systolic (congestive) and diastolic (congestive) heart failure: Secondary | ICD-10-CM | POA: Diagnosis not present

## 2017-03-17 DIAGNOSIS — B370 Candidal stomatitis: Secondary | ICD-10-CM | POA: Diagnosis present

## 2017-03-17 DIAGNOSIS — Z85828 Personal history of other malignant neoplasm of skin: Secondary | ICD-10-CM

## 2017-03-17 DIAGNOSIS — E870 Hyperosmolality and hypernatremia: Secondary | ICD-10-CM | POA: Diagnosis not present

## 2017-03-17 DIAGNOSIS — R0600 Dyspnea, unspecified: Secondary | ICD-10-CM

## 2017-03-17 DIAGNOSIS — R197 Diarrhea, unspecified: Secondary | ICD-10-CM | POA: Diagnosis present

## 2017-03-17 DIAGNOSIS — M797 Fibromyalgia: Secondary | ICD-10-CM | POA: Diagnosis present

## 2017-03-17 DIAGNOSIS — I2510 Atherosclerotic heart disease of native coronary artery without angina pectoris: Secondary | ICD-10-CM | POA: Diagnosis present

## 2017-03-17 DIAGNOSIS — R0602 Shortness of breath: Secondary | ICD-10-CM | POA: Diagnosis not present

## 2017-03-17 DIAGNOSIS — C8310 Mantle cell lymphoma, unspecified site: Secondary | ICD-10-CM | POA: Diagnosis not present

## 2017-03-17 DIAGNOSIS — E43 Unspecified severe protein-calorie malnutrition: Secondary | ICD-10-CM | POA: Diagnosis not present

## 2017-03-17 DIAGNOSIS — Z955 Presence of coronary angioplasty implant and graft: Secondary | ICD-10-CM

## 2017-03-17 DIAGNOSIS — L409 Psoriasis, unspecified: Secondary | ICD-10-CM | POA: Diagnosis present

## 2017-03-17 DIAGNOSIS — R7989 Other specified abnormal findings of blood chemistry: Secondary | ICD-10-CM

## 2017-03-17 DIAGNOSIS — H9190 Unspecified hearing loss, unspecified ear: Secondary | ICD-10-CM | POA: Diagnosis present

## 2017-03-17 DIAGNOSIS — E876 Hypokalemia: Secondary | ICD-10-CM | POA: Diagnosis not present

## 2017-03-17 DIAGNOSIS — R531 Weakness: Secondary | ICD-10-CM | POA: Diagnosis not present

## 2017-03-17 DIAGNOSIS — C8299 Follicular lymphoma, unspecified, extranodal and solid organ sites: Secondary | ICD-10-CM

## 2017-03-17 DIAGNOSIS — Z79899 Other long term (current) drug therapy: Secondary | ICD-10-CM

## 2017-03-17 DIAGNOSIS — K219 Gastro-esophageal reflux disease without esophagitis: Secondary | ICD-10-CM | POA: Diagnosis present

## 2017-03-17 DIAGNOSIS — E878 Other disorders of electrolyte and fluid balance, not elsewhere classified: Secondary | ICD-10-CM | POA: Diagnosis present

## 2017-03-17 DIAGNOSIS — I110 Hypertensive heart disease with heart failure: Secondary | ICD-10-CM | POA: Diagnosis present

## 2017-03-17 DIAGNOSIS — E782 Mixed hyperlipidemia: Secondary | ICD-10-CM | POA: Diagnosis not present

## 2017-03-17 DIAGNOSIS — M109 Gout, unspecified: Secondary | ICD-10-CM | POA: Diagnosis present

## 2017-03-17 DIAGNOSIS — E860 Dehydration: Secondary | ICD-10-CM | POA: Diagnosis present

## 2017-03-17 DIAGNOSIS — I10 Essential (primary) hypertension: Secondary | ICD-10-CM | POA: Diagnosis present

## 2017-03-17 DIAGNOSIS — N179 Acute kidney failure, unspecified: Secondary | ICD-10-CM | POA: Diagnosis present

## 2017-03-17 DIAGNOSIS — K1230 Oral mucositis (ulcerative), unspecified: Secondary | ICD-10-CM | POA: Diagnosis present

## 2017-03-17 DIAGNOSIS — I951 Orthostatic hypotension: Secondary | ICD-10-CM

## 2017-03-17 DIAGNOSIS — N1831 Chronic kidney disease, stage 3a: Secondary | ICD-10-CM | POA: Diagnosis present

## 2017-03-17 DIAGNOSIS — E785 Hyperlipidemia, unspecified: Secondary | ICD-10-CM | POA: Diagnosis present

## 2017-03-17 DIAGNOSIS — R112 Nausea with vomiting, unspecified: Secondary | ICD-10-CM | POA: Diagnosis present

## 2017-03-17 DIAGNOSIS — Z6823 Body mass index (BMI) 23.0-23.9, adult: Secondary | ICD-10-CM

## 2017-03-17 DIAGNOSIS — R061 Stridor: Secondary | ICD-10-CM | POA: Diagnosis not present

## 2017-03-17 DIAGNOSIS — C8289 Other types of follicular lymphoma, extranodal and solid organ sites: Secondary | ICD-10-CM | POA: Diagnosis not present

## 2017-03-17 DIAGNOSIS — R404 Transient alteration of awareness: Secondary | ICD-10-CM | POA: Diagnosis not present

## 2017-03-17 LAB — CBC WITH DIFFERENTIAL/PLATELET
BASOS ABS: 0 10*3/uL (ref 0.0–0.1)
Basophils Relative: 0 %
Eosinophils Absolute: 0.1 10*3/uL (ref 0.0–0.7)
Eosinophils Relative: 1 %
HEMATOCRIT: 41.2 % (ref 39.0–52.0)
Hemoglobin: 13.2 g/dL (ref 13.0–17.0)
LYMPHS PCT: 9 %
Lymphs Abs: 1.1 10*3/uL (ref 0.7–4.0)
MCH: 30.6 pg (ref 26.0–34.0)
MCHC: 32 g/dL (ref 30.0–36.0)
MCV: 95.4 fL (ref 78.0–100.0)
Monocytes Absolute: 0.9 10*3/uL (ref 0.1–1.0)
Monocytes Relative: 8 %
NEUTROS ABS: 9.2 10*3/uL — AB (ref 1.7–7.7)
Neutrophils Relative %: 82 %
Platelets: 126 10*3/uL — ABNORMAL LOW (ref 150–400)
RBC: 4.32 MIL/uL (ref 4.22–5.81)
RDW: 18 % — ABNORMAL HIGH (ref 11.5–15.5)
WBC: 11.2 10*3/uL — ABNORMAL HIGH (ref 4.0–10.5)

## 2017-03-17 LAB — COMPREHENSIVE METABOLIC PANEL
ALT: 14 U/L — ABNORMAL LOW (ref 17–63)
AST: 16 U/L (ref 15–41)
Albumin: 3 g/dL — ABNORMAL LOW (ref 3.5–5.0)
Alkaline Phosphatase: 111 U/L (ref 38–126)
Anion gap: 9 (ref 5–15)
BILIRUBIN TOTAL: 0.7 mg/dL (ref 0.3–1.2)
BUN: 17 mg/dL (ref 6–20)
CHLORIDE: 113 mmol/L — AB (ref 101–111)
CO2: 28 mmol/L (ref 22–32)
CREATININE: 2.09 mg/dL — AB (ref 0.61–1.24)
Calcium: 8.1 mg/dL — ABNORMAL LOW (ref 8.9–10.3)
GFR calc Af Amer: 32 mL/min — ABNORMAL LOW (ref >60)
GFR, EST NON AFRICAN AMERICAN: 28 mL/min — AB (ref >60)
Glucose, Bld: 105 mg/dL — ABNORMAL HIGH (ref 65–99)
Potassium: 3.2 mmol/L — ABNORMAL LOW (ref 3.5–5.1)
Sodium: 150 mmol/L — ABNORMAL HIGH (ref 135–145)
TOTAL PROTEIN: 5.6 g/dL — AB (ref 6.5–8.1)

## 2017-03-17 LAB — BRAIN NATRIURETIC PEPTIDE: B NATRIURETIC PEPTIDE 5: 132.9 pg/mL — AB (ref 0.0–100.0)

## 2017-03-17 LAB — URINALYSIS, ROUTINE W REFLEX MICROSCOPIC
Bilirubin Urine: NEGATIVE
Glucose, UA: NEGATIVE mg/dL
Hgb urine dipstick: NEGATIVE
Ketones, ur: NEGATIVE mg/dL
Leukocytes, UA: NEGATIVE
Nitrite: NEGATIVE
PH: 7 (ref 5.0–8.0)
Protein, ur: NEGATIVE mg/dL
Specific Gravity, Urine: 1.004 — ABNORMAL LOW (ref 1.005–1.030)

## 2017-03-17 LAB — BASIC METABOLIC PANEL
ANION GAP: 10 (ref 5–15)
BUN: 17 mg/dL (ref 6–20)
CHLORIDE: 112 mmol/L — AB (ref 101–111)
CO2: 28 mmol/L (ref 22–32)
Calcium: 8.3 mg/dL — ABNORMAL LOW (ref 8.9–10.3)
Creatinine, Ser: 2 mg/dL — ABNORMAL HIGH (ref 0.61–1.24)
GFR calc Af Amer: 34 mL/min — ABNORMAL LOW (ref >60)
GFR calc non Af Amer: 29 mL/min — ABNORMAL LOW (ref >60)
GLUCOSE: 165 mg/dL — AB (ref 65–99)
POTASSIUM: 3.6 mmol/L (ref 3.5–5.1)
Sodium: 150 mmol/L — ABNORMAL HIGH (ref 135–145)

## 2017-03-17 LAB — TROPONIN I: Troponin I: <0.03 ng/mL (ref <0.03)

## 2017-03-17 MED ORDER — MAGIC MOUTHWASH
5.0000 mL | Freq: Three times a day (TID) | ORAL | Status: DC
Start: 2017-03-17 — End: 2017-03-18
  Administered 2017-03-18: 5 mL via ORAL
  Filled 2017-03-17 (×3): qty 5

## 2017-03-17 MED ORDER — PANTOPRAZOLE SODIUM 40 MG PO TBEC
40.0000 mg | DELAYED_RELEASE_TABLET | Freq: Every day | ORAL | Status: DC
  Administered 2017-03-18 – 2017-03-20 (×3): 40 mg via ORAL
  Filled 2017-03-17 (×3): qty 1

## 2017-03-17 MED ORDER — ATENOLOL 25 MG PO TABS
12.5000 mg | ORAL_TABLET | Freq: Every day | ORAL | Status: DC
  Administered 2017-03-17 – 2017-03-18 (×2): 12.5 mg via ORAL
  Filled 2017-03-17 (×2): qty 1

## 2017-03-17 MED ORDER — HYDROCORTISONE ACETATE 25 MG RE SUPP
25.0000 mg | Freq: Two times a day (BID) | RECTAL | Status: DC
  Filled 2017-03-17: qty 1

## 2017-03-17 MED ORDER — ONDANSETRON HCL 4 MG/2ML IJ SOLN: 4.0000 mg | Freq: Four times a day (QID) | INTRAMUSCULAR | Status: DC | PRN

## 2017-03-17 MED ORDER — SODIUM CHLORIDE 0.9 % IV BOLUS (SEPSIS)
500.0000 mL | Freq: Once | INTRAVENOUS | Status: AC
  Administered 2017-03-17: 500 mL via INTRAVENOUS

## 2017-03-17 MED ORDER — ONDANSETRON HCL 4 MG PO TABS
4.0000 mg | ORAL_TABLET | Freq: Four times a day (QID) | ORAL | Status: DC | PRN
  Administered 2017-03-20: 4 mg via ORAL
  Filled 2017-03-17: qty 1

## 2017-03-17 MED ORDER — ROSUVASTATIN CALCIUM 10 MG PO TABS
10.0000 mg | ORAL_TABLET | Freq: Every day | ORAL | Status: DC
  Administered 2017-03-17 – 2017-03-20 (×4): 10 mg via ORAL
  Filled 2017-03-17 (×4): qty 1

## 2017-03-17 MED ORDER — SODIUM CHLORIDE 0.9% FLUSH
3.0000 mL | Freq: Two times a day (BID) | INTRAVENOUS | Status: DC
  Administered 2017-03-17 – 2017-03-18 (×2): 3 mL via INTRAVENOUS

## 2017-03-17 MED ORDER — ALBUTEROL SULFATE (2.5 MG/3ML) 0.083% IN NEBU: 2.5000 mg | INHALATION_SOLUTION | RESPIRATORY_TRACT | Status: DC | PRN

## 2017-03-17 MED ORDER — NYSTATIN 100000 UNIT/ML MT SUSP
5.0000 mL | Freq: Four times a day (QID) | OROMUCOSAL | Status: DC
  Administered 2017-03-17 – 2017-03-20 (×9): 500000 [IU] via OROMUCOSAL
  Filled 2017-03-17 (×9): qty 5

## 2017-03-17 MED ORDER — ACETAMINOPHEN 325 MG PO TABS: 650.0000 mg | ORAL_TABLET | Freq: Four times a day (QID) | ORAL | Status: DC | PRN

## 2017-03-17 MED ORDER — ENOXAPARIN SODIUM 30 MG/0.3ML ~~LOC~~ SOLN
30.0000 mg | SUBCUTANEOUS | Status: DC
  Administered 2017-03-17: 30 mg via SUBCUTANEOUS
  Filled 2017-03-17: qty 0.3

## 2017-03-17 MED ORDER — ALLOPURINOL 100 MG PO TABS
200.0000 mg | ORAL_TABLET | Freq: Every day | ORAL | Status: DC
  Administered 2017-03-18 – 2017-03-20 (×3): 200 mg via ORAL
  Filled 2017-03-17 (×3): qty 2

## 2017-03-17 MED ORDER — POTASSIUM CHLORIDE CRYS ER 20 MEQ PO TBCR
40.0000 meq | EXTENDED_RELEASE_TABLET | Freq: Once | ORAL | Status: AC
  Administered 2017-03-17: 40 meq via ORAL
  Filled 2017-03-17: qty 2

## 2017-03-17 MED ORDER — DEXTROSE-NACL 5-0.45 % IV SOLN
INTRAVENOUS | Status: AC
  Administered 2017-03-17: 22:00:00 via INTRAVENOUS

## 2017-03-17 MED ORDER — ACETAMINOPHEN 650 MG RE SUPP: 650.0000 mg | Freq: Four times a day (QID) | RECTAL | Status: DC | PRN

## 2017-03-17 MED ORDER — HYDROCODONE-ACETAMINOPHEN 5-325 MG PO TABS: 1.0000 | ORAL_TABLET | ORAL | Status: DC | PRN

## 2017-03-17 MED ORDER — PROCHLORPERAZINE MALEATE 10 MG PO TABS: 10.0000 mg | ORAL_TABLET | Freq: Four times a day (QID) | ORAL | Status: DC | PRN

## 2017-03-17 MED ORDER — GUAIFENESIN ER 600 MG PO TB12
600.0000 mg | ORAL_TABLET | Freq: Two times a day (BID) | ORAL | Status: DC
  Administered 2017-03-17 – 2017-03-20 (×6): 600 mg via ORAL
  Filled 2017-03-17 (×6): qty 1

## 2017-03-17 NOTE — ED Notes (Signed)
Bed: YM41 Expected date:  Expected time:  Means of arrival:  Comments: EMS 83 yo, SOB

## 2017-03-17 NOTE — ED Provider Notes (Signed)
[facility] DEPT Provider Note   CSN: [id number] Arrival date & time: 03/17/17  1513     History   Chief Complaint Chief Complaint  Patient presents with  . Shortness of Breath    HPI Jacob Thoma. is a 83 y.o. male.  The history is provided by the patient.  Shortness of Breath  This is a new problem. The average episode lasts 5 minutes. The problem occurs rarely.The current episode started 3 to 5 hours ago. The problem has been resolved. Associated symptoms include leg swelling (improving from baseline). Pertinent negatives include no fever, no rhinorrhea, no cough, no sputum production, no wheezing, no orthopnea and no chest pain. Precipitated by: walked outside to the mailbox in the heat. pt does not get SOB while walking inside the home. Treatments tried: rest. The treatment provided significant relief. Associated medical issues include heart failure. Associated medical issues do not include asthma or COPD.   Pt is undergoing chemo and has been having associated nausea with decreased PO intake for the past several days.   Past Medical History:  Diagnosis Date  . Coronary artery disease    a. stent (promus) Cx/OM'09  . Fibromyalgia dx'd ~ 04/2015  . GERD (gastroesophageal reflux disease)   . Gout   . HEARING LOSS    "temporary"  . Hyperlipidemia   . HYPERLIPIDEMIA 10/13/2007  . Hypertension   . HYPERTENSION 10/13/2007  . Osteoarthritis    "left shoulder" (08/23/2015)  . Prostatitis, acute   . PROSTATITIS, ACUTE, HX OF 10/13/2007  . PSORIASIS 10/13/2007  . PVC (premature ventricular contraction)    hx  . SKIN CANCER, RECURRENT 10/13/2007    Patient Active Problem List   Diagnosis Date Noted  . Hypokalemia 03/17/2017  . Hypernatremia 03/17/2017  . AKI (acute kidney injury) ([place]) 03/17/2017  . Dehydration 03/17/2017  . Mantle cell lymphoma, lymph nodes of multiple sites ([place]) 02/24/2017  . 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 GI bleed 02/03/2017  . Lower GI bleed 02/03/2017  . Coronary artery disease   . IFG (impaired fasting glucose) 10/03/2015  . PMR (polymyalgia rheumatica) ([facility]) 09/07/2015  . Orthostatic hypotension 08/24/2015  . Viral syndrome 08/24/2015  . GERD (gastroesophageal reflux disease) 03/24/2014  . ACP (advance care planning) 11/02/2013  . Left upper arm pain 09/15/2012  . Routine health maintenance 10/29/2011  . HEARING LOSS 11/05/2010  . Chest pain 11/06/2009  . SKIN CANCER, RECURRENT 10/13/2007  . Hyperlipidemia 10/13/2007  . Gout 10/13/2007  . Essential hypertension 10/13/2007  . PVC (premature ventricular contraction) 10/13/2007  . PSORIASIS 10/13/2007  . OSTEOARTHRITIS, ANKLE, RIGHT 10/13/2007  . Other acquired absence of organ 10/13/2007    Past Surgical History:  Procedure Laterality Date  . APPENDECTOMY    . CORONARY ANGIOPLASTY WITH STENT PLACEMENT    . CYST EXCISION Right X 2   shoulder  . ELECTROLYSIS OF MISDIRECTED LASHES Bilateral    eyelashes are misdirected and grow inwards   . EYE SURGERY    . MASS EXCISION Right 01/2005   proximal thigh soft tissue mass  . TEAR DUCT PROBING Left   . TYMPANOPLASTY Bilateral    "for hearing loss; put tubes in also, 2X on right, 1X on the left; tubes worked"  . VASECTOMY         Home Medications    Prior to Admission medications   Medication Sig Start Date End Date Taking? Authorizing Provider  allopurinol (ZYLOPRIM) 100 MG tablet Take  2 tablets (200 mg total) by mouth daily. 02/24/17   [name], MD  atenolol (TENORMIN) 25 MG tablet TAKE ONE-HALF TABLET BY MOUTH ONCE DAILY. 10/22/16   Fay Records, MD  dexamethasone (DECADRON) 4 MG tablet Take 2 tablets (8 mg total) by mouth daily. Start the day after bendamustine chemotherapy for 2 days. Take with food. 02/24/17   [name], MD  famotidine (PEPCID) 20 MG tablet Take 1 tablet (20 mg total) by mouth 2 (two) times daily. Patient not  taking: Reported on 03/03/2017 02/05/17   Robbie Lis, MD  furosemide (LASIX) 20 MG tablet Take 1 tablet (20 mg total) by mouth daily. 03/13/17   [name], MD  hydrocortisone (ANUSOL-HC) 2.5 % rectal cream Place rectally 3 (three) times daily. 02/05/17   Robbie Lis, MD  hydrocortisone (ANUSOL-HC) 25 MG suppository Place 1 suppository (25 mg total) rectally 2 (two) times daily. 02/11/17   Judge Stall, MD  lisinopril (PRINIVIL,ZESTRIL) 2.5 MG tablet Take 1 tablet (2.5 mg total) by mouth daily. 02/19/17 05/20/17  Richardson Dopp T, PA-C  mesalamine (CANASA) 1000 MG suppository Place 1 suppository (1,000 mg total) rectally at bedtime. 12/30/16   Milus Banister, MD  nitroGLYCERIN (NITROSTAT) 0.4 MG SL tablet DISSOLVE ONE TABLET UNDER THE TONGUE EVERY 5 MINUTES AS NEEDED 09/20/15   Fay Records, MD  ondansetron (ZOFRAN) 8 MG tablet Take 1 tablet (8 mg total) by mouth 2 (two) times daily as needed for refractory nausea / vomiting. Start on day 2 after bendamustine chemo. 02/24/17   [name], MD  pantoprazole (PROTONIX) 40 MG tablet Take 1 tablet (40 mg total) by mouth daily. 02/05/17   Robbie Lis, MD  potassium chloride SA (K-DUR,KLOR-CON) 20 MEQ tablet Take 1 tablet (20 mEq total) by mouth daily. 03/13/17   [name], MD  prochlorperazine (COMPAZINE) 10 MG tablet Take 1 tablet (10 mg total) by mouth every 6 (six) hours as needed (Nausea or vomiting). 02/24/17   [name], MD  rosuvastatin (CRESTOR) 10 MG tablet Take one tablet by mouth daily prior to bedtime 08/05/16   Fay Records, MD    Family History Family History  Problem Relation Age of Onset  . Heart attack Father 1       died  . Heart disease Father   . Parkinsonism Mother 2       died  . Breast cancer Sister        died  . Lung cancer Unknown        uncle died  . Colon cancer Neg Hx     Social History Social History  Substance Use Topics  . Smoking status: Never Smoker  .  Smokeless tobacco: Never Used  . Alcohol use No     Allergies   Niacin   Review of Systems Review of Systems  Constitutional: Negative for fever.  HENT: Negative for rhinorrhea.   Respiratory: Positive for shortness of breath. Negative for cough, sputum production and wheezing.   Cardiovascular: Positive for leg swelling (improving from baseline). Negative for chest pain and orthopnea.  Gastrointestinal: Negative for abdominal distention.  All other systems are reviewed and are negative for acute change except as noted in the HPI    Physical Exam Updated Vital Signs BP (!) 143/81 (BP Location: Left Arm)   Pulse 81   Temp 98.3 F (36.8 C) (Oral)   Resp 17   Ht 6' (1.829 m)  Wt 80.7 kg (178 lb)   SpO2 99%   BMI 24.14 kg/m   Physical Exam  Constitutional: He is oriented to person, place, and time. He appears well-developed and well-nourished. No distress.  HENT:  Head: Normocephalic and atraumatic.  Nose: Nose normal.  Mouth/Throat: Mucous membranes are dry.  Eyes: Conjunctivae and EOM are normal. Pupils are equal, round, and reactive to light. Right eye exhibits no discharge. Left eye exhibits no discharge. No scleral icterus.  Neck: Normal range of motion. Neck supple.  Cardiovascular: Normal rate and regular rhythm.  Exam reveals no gallop and no friction rub.   No murmur heard. Pulmonary/Chest: Effort normal and breath sounds normal. No stridor. No respiratory distress. He has no rales.  Abdominal: Soft. He exhibits no distension. There is no tenderness.  Musculoskeletal: He exhibits no edema or tenderness.  1+ BLE pitting edema  Neurological: He is alert and oriented to person, place, and time.  Skin: Skin is warm and dry. No rash noted. He is not diaphoretic. No erythema.  Psychiatric: He has a normal mood and affect.  Vitals reviewed.    ED Treatments / Results  Labs (all labs ordered are listed, but only abnormal results are displayed) Labs Reviewed    CBC WITH DIFFERENTIAL/PLATELET - Abnormal; Notable for the following:       Result Value   WBC 11.2 (*)    RDW 18.0 (*)    Platelets 126 (*)    Neutro Abs 9.2 (*)    All other components within normal limits  COMPREHENSIVE METABOLIC PANEL - Abnormal; Notable for the following:    Sodium 150 (*)    Potassium 3.2 (*)    Chloride 113 (*)    Glucose, Bld 105 (*)    Creatinine, Ser 2.09 (*)    Calcium 8.1 (*)    Total Protein 5.6 (*)    Albumin 3.0 (*)    ALT 14 (*)    GFR calc non Af Amer 28 (*)    GFR calc Af Amer 32 (*)    All other components within normal limits  BRAIN NATRIURETIC PEPTIDE - Abnormal; Notable for the following:    B Natriuretic Peptide 132.9 (*)    All other components within normal limits  TROPONIN I  TROPONIN I  TROPONIN I    EKG  EKG Interpretation None       Radiology Dg Chest 2 View  Result Date: 03/17/2017 CLINICAL DATA:  5 minute episode of shortness of breath while walking today. History of non-Hodgkin's lymphoma. EXAM: CHEST  2 VIEW COMPARISON:  Chest radiograph September 27, 2016 FINDINGS: Cardiomediastinal silhouette is normal. No pleural effusions or focal consolidations. Unchanged strandy densities LEFT lung base. Trachea projects midline and there is no pneumothorax. Soft tissue planes and included osseous structures are non-suspicious. IMPRESSION: Stable LEFT lung base atelectasis/ scarring. Electronically Signed   By: Elon Alas M.D.   On: 03/17/2017 16:02    Procedures Procedures (including critical care time)  Medications Ordered in ED Medications  sodium chloride 0.9 % bolus 500 mL (0 mLs Intravenous Stopped 03/17/17 1901)     Initial Impression / Assessment and Plan / ED Course  I have reviewed the triage vital signs and the nursing notes.  Pertinent labs & imaging results that were available during my care of the patient were reviewed by me and considered in my medical decision making (see chart for details).     1.  DOE The patient had a brief, 5 minute  episode of shortness of breath while exerting himself in the heat. Denied any associated chest pain, diaphoresis that would be concerning for ACS. Patient does have a history of CAD with prior stenting in 2009 however is not having any other ACS, location since. 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  Per family, the patient has been feeling more fatigued recently and has not been bleeding around the house as much as he normally does. Patient denies any dyspnea on exertion with ambulation with in the house. He does state that he feels very dehydrated.  Believe that there is a component of dehydration and deconditioning. I have low suspicion for ACS causing his brief shortness of breath. However given his history we'll obtain EKG, and troponin. EKG without acute ischemic changes or evidence of pericarditis. Trop neg. BNP less than 150. Chest x-ray without evidence of pulmonary edema, pneumonia, pleural effusions, pneumothorax.  I have low suspicion for pulmonary embolism given the brief episode. However patient does have a history of cancer and likely recent dehydration from overdiuresing. Unfortunately due to the overdiuresis and patient's renal function is impaired and is not able to obtain because CT PE study.  Given his significant renal injury, patient will be admitted for IV hydration and further evaluation. If concern for PE is high, patient will require a VQ scan.  Final Clinical Impressions(s) / ED Diagnoses   Final diagnoses:  SOB (shortness of breath)  AKI (acute kidney injury) ([place])  Dehydration  Hypernatremia      Cardama, Grayce Sessions, MD 03/17/17 [phone]

## 2017-03-17 NOTE — H&P (Addendum)
[name]. UKG:[id number] DOB: 09/06/1933 DOA: 03/17/2017     PCP: Eulas Post, MD   Outpatient Specialists: Sherry Ruffing, Cardiology [facility] Challenger  Patient coming from   home Lives  With family    Chief Complaint: SOB  HPI: [name]. is a 83 y.o. male with medical history significant of Mantle cell lymphoma, hypertension, dyslipidemia, psoriasis, fibromyalgia, coronary artery disease status post PCI in 2009, GERD    Presented with patient walk to his mailbox today and started to feel somewhat short of breath this lasted between 4-5 minutes but was severe enough to where he called EMS.  On arrival shortness of breath already resolved.  He has not had any associated chest pain or lightheadedness. Of note patient recently been started on Lasix 2 days ago secondary to dependent edema and has had decreased p.o. intake as well as decreased in ambulation secondary to generalized fatigue and weakness denies any fevers or chills Otherwise no wheezing no cough he reports that he was very hot when he walks to the mailbox.  When he rested he felt much better. Denies being lightheaded when he stands up.  Reports cotton sensation in his mouth that has bothered hi foe the past 4-5days.  No burning no ulcers, denies pain with swallowing. 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 Followed by oncology status post  1st chemotherapy 0n may 9th  and Neulasta on May 12th Coronary artery disease and combined systolic diastolic CHF last echogram in October 2016 showed EF 76-28% grade 1 diastolic dysfunction  IN ER:  Temp (24hrs), Avg:98.3 F (36.8 C), Min:98.3 F (36.8 C), Max:98.3 F (36.8 C)     RR 19   98%  HR 68 BP 148/76 WBC 11.2  Hg 13.2 Na 150  K 3.2  Cr 2.09 up form 1.6 BNP 132.9 CXR non-acute  Following Medications were ordered in ER: Medications  sodium chloride 0.9 % bolus 500 mL (500 mLs Intravenous New Bag/Given 03/17/17 1755)       Hospitalist was called for admission for dehydration, hypernatremia and dyspnea  Review of Systems:    Pertinent positives include:  fatigue, weight loss  shortness of breath at rest  dyspnea on exertion,  Constitutional:  No weight loss, night sweats, Fevers, chills, HEENT:  No headaches, Difficulty swallowing,Tooth/dental problems,Sore throat,  No sneezing, itching, ear ache, nasal congestion, post nasal drip,  Cardio-vascular:  No chest pain, Orthopnea, PND, anasarca, dizziness, palpitations.no Bilateral lower extremity swelling  GI:  No heartburn, indigestion, abdominal pain, nausea, vomiting, diarrhea, change in bowel habits, loss of appetite, melena, blood in stool, hematemesis Resp:   No excess mucus, no productive cough, No non-productive cough, No coughing up of blood.No change in color of mucus.No wheezing. Skin:  no rash or lesions. No jaundice GU:  no dysuria, change in color of urine, no urgency or frequency. No straining to urinate.  No flank pain.  Musculoskeletal:  No joint pain or no joint swelling. No decreased range of motion. No back pain.  Psych:  No change in mood or affect. No depression or anxiety. No memory loss.  Neuro: no localizing neurological complaints, no tingling, no weakness, no double vision, no gait abnormality, no slurred speech, no confusion  As per HPI otherwise 10 point review of systems negative.   Past Medical History: Past Medical History:  Diagnosis Date  . Coronary artery disease    a. stent (promus) Cx/OM'09  . Fibromyalgia dx'd ~ 04/2015  .  GERD (gastroesophageal reflux disease)   . Gout   . HEARING LOSS    "temporary"  . Hyperlipidemia   . HYPERLIPIDEMIA 10/13/2007  . Hypertension   . HYPERTENSION 10/13/2007  . Osteoarthritis    "left shoulder" (08/23/2015)  . Prostatitis, acute   . PROSTATITIS, ACUTE, HX OF 10/13/2007  . PSORIASIS 10/13/2007  . PVC (premature ventricular contraction)    hx  . SKIN CANCER,  RECURRENT 10/13/2007   Past Surgical History:  Procedure Laterality Date  . APPENDECTOMY    . CORONARY ANGIOPLASTY WITH STENT PLACEMENT    . CYST EXCISION Right X 2   shoulder  . ELECTROLYSIS OF MISDIRECTED LASHES Bilateral    eyelashes are misdirected and grow inwards   . EYE SURGERY    . MASS EXCISION Right 01/2005   proximal thigh soft tissue mass  . TEAR DUCT PROBING Left   . TYMPANOPLASTY Bilateral    "for hearing loss; put tubes in also, 2X on right, 1X on the left; tubes worked"  . VASECTOMY       Social History:  Ambulatory   independently      reports that he has never smoked. He has never used smokeless tobacco. He reports that he does not drink alcohol or use drugs.  Allergies:   Allergies  Allergen Reactions  . Niacin Hives       Family History:   Family History  Problem Relation Age of Onset  . Heart attack Father 41       died  . Heart disease Father   . Parkinsonism Mother 61       died  . Breast cancer Sister        died  . Lung cancer Unknown        uncle died  . Colon cancer Neg Hx     Medications: Prior to Admission medications   Medication Sig Start Date End Date Taking? Authorizing Provider  allopurinol (ZYLOPRIM) 100 MG tablet Take 2 tablets (200 mg total) by mouth daily. 02/24/17   [name], MD  atenolol (TENORMIN) 25 MG tablet TAKE ONE-HALF TABLET BY MOUTH ONCE DAILY. 10/22/16   Fay Records, MD  dexamethasone (DECADRON) 4 MG tablet Take 2 tablets (8 mg total) by mouth daily. Start the day after bendamustine chemotherapy for 2 days. Take with food. 02/24/17   [name], MD  famotidine (PEPCID) 20 MG tablet Take 1 tablet (20 mg total) by mouth 2 (two) times daily. Patient not taking: Reported on 03/03/2017 02/05/17   Robbie Lis, MD  furosemide (LASIX) 20 MG tablet Take 1 tablet (20 mg total) by mouth daily. 03/13/17   [name], MD  hydrocortisone (ANUSOL-HC) 2.5 % rectal cream Place rectally 3 (three)  times daily. 02/05/17   Robbie Lis, MD  hydrocortisone (ANUSOL-HC) 25 MG suppository Place 1 suppository (25 mg total) rectally 2 (two) times daily. 02/11/17   Judge Stall, MD  lisinopril (PRINIVIL,ZESTRIL) 2.5 MG tablet Take 1 tablet (2.5 mg total) by mouth daily. 02/19/17 05/20/17  Richardson Dopp T, PA-C  mesalamine (CANASA) 1000 MG suppository Place 1 suppository (1,000 mg total) rectally at bedtime. 12/30/16   Milus Banister, MD  nitroGLYCERIN (NITROSTAT) 0.4 MG SL tablet DISSOLVE ONE TABLET UNDER THE TONGUE EVERY 5 MINUTES AS NEEDED 09/20/15   Fay Records, MD  ondansetron (ZOFRAN) 8 MG tablet Take 1 tablet (8 mg total) by mouth 2 (two) times daily as needed for refractory nausea / vomiting.  Start on day 2 after bendamustine chemo. 02/24/17   [name], MD  pantoprazole (PROTONIX) 40 MG tablet Take 1 tablet (40 mg total) by mouth daily. 02/05/17   Robbie Lis, MD  potassium chloride SA (K-DUR,KLOR-CON) 20 MEQ tablet Take 1 tablet (20 mEq total) by mouth daily. 03/13/17   [name], MD  prochlorperazine (COMPAZINE) 10 MG tablet Take 1 tablet (10 mg total) by mouth every 6 (six) hours as needed (Nausea or vomiting). 02/24/17   [name], MD  rosuvastatin (CRESTOR) 10 MG tablet Take one tablet by mouth daily prior to bedtime 08/05/16   Fay Records, MD    Physical Exam: Patient Vitals for the past 24 hrs:  BP Temp Temp src Pulse Resp SpO2 Height Weight  03/17/17 1755 (!) 148/76 - - 68 19 98 % - -  03/17/17 1534 - - - - - - 6' (1.829 m) 80.7 kg (178 lb)  03/17/17 1528 (!) 143/81 98.3 F (36.8 C) Oral 81 17 99 % - -    1. General:  in No Acute distress 2. Psychological: Alert and  Oriented 3. Head/ENT:     Dry Mucous Membranes                          Head Non traumatic, neck supple                           Poor Dentition                         White plaques on soft palate and uvula 4. SKIN:  decreased Skin turgor,  Skin clean Dry and intact no  rash 5. Heart: Regular rate and rhythm no Murmur, Rub or gallop 6. Lungs:  no wheezes or crackles   7. Abdomen: Soft, non-tender, Non distended 8. Lower extremities: no clubbing, cyanosis, or edema 9. Neurologically Grossly intact, moving all 4 extremities equally  10. MSK: Normal range of motion   body mass index is 24.14 kg/m.  Labs on Admission:   Labs on Admission: I have personally reviewed following labs and imaging studies  CBC:  Recent Labs Lab 03/13/17 0814 03/17/17 1605  WBC 19.3* 11.2*  NEUTROABS 16.9* 9.2*  HGB 12.7* 13.2  HCT 39.2 41.2  MCV 94.2 95.4  PLT 172 620*   Basic Metabolic Panel:  Recent Labs Lab 03/13/17 0814 03/13/17 0919 03/17/17 1605  NA 146*  --  150*  K 3.5  --  3.2*  CL  --   --  113*  CO2 28  --  28  GLUCOSE 101  --  105*  BUN 14.2  --  17  CREATININE 1.6*  --  2.09*  CALCIUM 8.1*  --  8.1*  PHOS  --  3.5  --    GFR: Estimated Creatinine Clearance: 29.9 mL/min (A) (by C-G formula based on SCr of 2.09 mg/dL (H)). Liver Function Tests:  Recent Labs Lab 03/13/17 0814 03/17/17 1605  AST 15 16  ALT 13 14*  ALKPHOS 129 111  BILITOT 0.70 0.7  PROT 4.9* 5.6*  ALBUMIN 2.5* 3.0*   No results for input(s): LIPASE, AMYLASE in the last 168 hours. No results for input(s): AMMONIA in the last 168 hours. Coagulation Profile: No results for input(s): INR, PROTIME in the last 168 hours. Cardiac Enzymes: No results for input(s): CKTOTAL, CKMB, CKMBINDEX, TROPONINI  in the last 168 hours. BNP (last 3 results) No results for input(s): PROBNP in the last 8760 hours. HbA1C: No results for input(s): HGBA1C in the last 72 hours. CBG: No results for input(s): GLUCAP in the last 168 hours. Lipid Profile: No results for input(s): CHOL, HDL, LDLCALC, TRIG, CHOLHDL, LDLDIRECT in the last 72 hours. Thyroid Function Tests: No results for input(s): TSH, T4TOTAL, FREET4, T3FREE, THYROIDAB in the last 72 hours. Anemia Panel: No results for  input(s): VITAMINB12, FOLATE, FERRITIN, TIBC, IRON, RETICCTPCT in the last 72 hours. Urine analysis:  Sepsis Labs: @LABRCNTIP (procalcitonin:4,lacticidven:4) )No results found for this or any previous visit (from the past 240 hour(s)).     UA ordered  Lab Results  Component Value Date   HGBA1C 6.1 11/15/2015    Estimated Creatinine Clearance: 29.9 mL/min (A) (by C-G formula based on SCr of 2.09 mg/dL (H)).  BNP (last 3 results) No results for input(s): PROBNP in the last 8760 hours.   ECG REPORT  Independently reviewed Rate: 75  Rhythm: SR with PaC ST&T Change: No acute ischemic changes   QTC 470  Filed Weights   03/17/17 1534  Weight: 80.7 kg (178 lb)     Cultures: No results found for: SDES, SPECREQUEST, CULT, REPTSTATUS   Radiological Exams on Admission: Dg Chest 2 View  Result Date: 03/17/2017 CLINICAL DATA:  5 minute episode of shortness of breath while walking today. History of non-Hodgkin's lymphoma. EXAM: CHEST  2 VIEW COMPARISON:  Chest radiograph September 27, 2016 FINDINGS: Cardiomediastinal silhouette is normal. No pleural effusions or focal consolidations. Unchanged strandy densities LEFT lung base. Trachea projects midline and there is no pneumothorax. Soft tissue planes and included osseous structures are non-suspicious. IMPRESSION: Stable LEFT lung base atelectasis/ scarring. Electronically Signed   By: Elon Alas M.D.   On: 03/17/2017 16:02    Chart has been reviewed    Assessment/Plan  83 y.o. male with medical history significant of Mantle cell lymphoma, hypertension, dyslipidemia, psoriasis, fibromyalgia, coronary artery disease status post PCI in 2009, GERD admitted for  dehydration, hypernatremia and dyspnea  Present on Admission: Sudden onset of dyspnea after exertion ambulating -this was short-lived on a 4-5 minutes not associated with any chest pain given history of coronary artery disease possibility of anginal equivalent also could be  secondary to overall patient's deconditioning and dehydration. Obtain an echogram cycle cardiac enzymes monitor on telemetry given no evidence of hypoxia associated pleuritic chest pain PE being less likely . 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 Hyperlipidemia -  continue home medications . Hypokalemia administered potassium and replace check magnesium level . Hypernatremia -likely due to decreased p.o. intake and dehydration will administer D5 half normal saline gentle hydration and monitor serial sodium . AKI (acute kidney injury) ([place]) likely secondary to dehydration we will hold Lasix order urine electrolytes follow creatinine . Dehydration-hold Lasix check orthostatics in the morning  rehydrate Thrush -treated with Magic mouthwash and nystatin suspension contributing to dehydration . Coronary artery disease continue home medication cycle cardiac enzymes monitor on telemetry given sudden onset of dyspnea . Essential hypertension stable continue home medications Other plan as per orders.  DVT prophylaxis:   Lovenox     Code Status:  FULL CODE as per patient    Family Communication:   Family  at  Bedside  plan of care was discussed with   Daughter, Wife,  Disposition Plan:    To home once workup is complete  and patient is stable                              Consults called: add oncology on Epic  Admission status:   obs   Level of care   tele        I have spent a total of 66 min on this admission extra time   taken to discuss case with oncology [place] 03/17/2017, 8:53 PM    [facility]  Pager [phone]   after 2 AM please page floor coverage PA If 7AM-7PM, please contact the day team taking care of the patient  [email or web address]  Password [email or web address]

## 2017-03-17 NOTE — ED Triage Notes (Signed)
Per EMS, pt was walking to the mailbox today and began to feel short of breath and felt that way for 4-5 minutes (per patient).  EMS states that pt was not shob on arrival or throughout transport.   Pt denies shob, pain, or dizziness at this time. Pt has hx of cancer and is undergoing treatment for non-Hodgkins lymphoma.  A&O, no distress at this time.

## 2017-03-18 ENCOUNTER — Observation Stay (HOSPITAL_COMMUNITY): Payer: Medicare Other

## 2017-03-18 ENCOUNTER — Other Ambulatory Visit: Payer: Self-pay

## 2017-03-18 ENCOUNTER — Observation Stay (HOSPITAL_BASED_OUTPATIENT_CLINIC_OR_DEPARTMENT_OTHER): Payer: Medicare Other

## 2017-03-18 ENCOUNTER — Other Ambulatory Visit: Payer: Self-pay | Admitting: Physician Assistant

## 2017-03-18 DIAGNOSIS — R000 Tachycardia, unspecified: Secondary | ICD-10-CM

## 2017-03-18 DIAGNOSIS — R634 Abnormal weight loss: Secondary | ICD-10-CM

## 2017-03-18 DIAGNOSIS — Z6823 Body mass index (BMI) 23.0-23.9, adult: Secondary | ICD-10-CM | POA: Diagnosis not present

## 2017-03-18 DIAGNOSIS — K1230 Oral mucositis (ulcerative), unspecified: Secondary | ICD-10-CM

## 2017-03-18 DIAGNOSIS — B370 Candidal stomatitis: Secondary | ICD-10-CM | POA: Diagnosis not present

## 2017-03-18 DIAGNOSIS — C8318 Mantle cell lymphoma, lymph nodes of multiple sites: Secondary | ICD-10-CM

## 2017-03-18 DIAGNOSIS — E878 Other disorders of electrolyte and fluid balance, not elsewhere classified: Secondary | ICD-10-CM | POA: Diagnosis present

## 2017-03-18 DIAGNOSIS — I5042 Chronic combined systolic (congestive) and diastolic (congestive) heart failure: Secondary | ICD-10-CM

## 2017-03-18 DIAGNOSIS — R197 Diarrhea, unspecified: Secondary | ICD-10-CM

## 2017-03-18 DIAGNOSIS — E860 Dehydration: Secondary | ICD-10-CM | POA: Diagnosis not present

## 2017-03-18 DIAGNOSIS — K625 Hemorrhage of anus and rectum: Secondary | ICD-10-CM | POA: Diagnosis not present

## 2017-03-18 DIAGNOSIS — I951 Orthostatic hypotension: Secondary | ICD-10-CM | POA: Diagnosis not present

## 2017-03-18 DIAGNOSIS — F329 Major depressive disorder, single episode, unspecified: Secondary | ICD-10-CM

## 2017-03-18 DIAGNOSIS — I110 Hypertensive heart disease with heart failure: Secondary | ICD-10-CM | POA: Diagnosis present

## 2017-03-18 DIAGNOSIS — R112 Nausea with vomiting, unspecified: Secondary | ICD-10-CM | POA: Diagnosis present

## 2017-03-18 DIAGNOSIS — Z85828 Personal history of other malignant neoplasm of skin: Secondary | ICD-10-CM | POA: Diagnosis not present

## 2017-03-18 DIAGNOSIS — E782 Mixed hyperlipidemia: Secondary | ICD-10-CM | POA: Diagnosis not present

## 2017-03-18 DIAGNOSIS — M797 Fibromyalgia: Secondary | ICD-10-CM | POA: Diagnosis present

## 2017-03-18 DIAGNOSIS — Z955 Presence of coronary angioplasty implant and graft: Secondary | ICD-10-CM | POA: Diagnosis not present

## 2017-03-18 DIAGNOSIS — E43 Unspecified severe protein-calorie malnutrition: Secondary | ICD-10-CM | POA: Diagnosis not present

## 2017-03-18 DIAGNOSIS — R7989 Other specified abnormal findings of blood chemistry: Secondary | ICD-10-CM | POA: Diagnosis not present

## 2017-03-18 DIAGNOSIS — E870 Hyperosmolality and hypernatremia: Secondary | ICD-10-CM | POA: Diagnosis not present

## 2017-03-18 DIAGNOSIS — R0600 Dyspnea, unspecified: Secondary | ICD-10-CM

## 2017-03-18 DIAGNOSIS — C8310 Mantle cell lymphoma, unspecified site: Secondary | ICD-10-CM | POA: Diagnosis present

## 2017-03-18 DIAGNOSIS — L409 Psoriasis, unspecified: Secondary | ICD-10-CM | POA: Diagnosis present

## 2017-03-18 DIAGNOSIS — I10 Essential (primary) hypertension: Secondary | ICD-10-CM | POA: Diagnosis not present

## 2017-03-18 DIAGNOSIS — R609 Edema, unspecified: Secondary | ICD-10-CM

## 2017-03-18 DIAGNOSIS — Z801 Family history of malignant neoplasm of trachea, bronchus and lung: Secondary | ICD-10-CM

## 2017-03-18 DIAGNOSIS — M109 Gout, unspecified: Secondary | ICD-10-CM | POA: Diagnosis present

## 2017-03-18 DIAGNOSIS — H9190 Unspecified hearing loss, unspecified ear: Secondary | ICD-10-CM | POA: Diagnosis present

## 2017-03-18 DIAGNOSIS — Z79899 Other long term (current) drug therapy: Secondary | ICD-10-CM | POA: Diagnosis not present

## 2017-03-18 DIAGNOSIS — C8289 Other types of follicular lymphoma, extranodal and solid organ sites: Secondary | ICD-10-CM | POA: Diagnosis not present

## 2017-03-18 DIAGNOSIS — N179 Acute kidney failure, unspecified: Secondary | ICD-10-CM | POA: Diagnosis not present

## 2017-03-18 DIAGNOSIS — E876 Hypokalemia: Secondary | ICD-10-CM | POA: Diagnosis not present

## 2017-03-18 DIAGNOSIS — K219 Gastro-esophageal reflux disease without esophagitis: Secondary | ICD-10-CM | POA: Diagnosis present

## 2017-03-18 DIAGNOSIS — E785 Hyperlipidemia, unspecified: Secondary | ICD-10-CM | POA: Diagnosis present

## 2017-03-18 DIAGNOSIS — R0602 Shortness of breath: Secondary | ICD-10-CM | POA: Diagnosis not present

## 2017-03-18 DIAGNOSIS — Z803 Family history of malignant neoplasm of breast: Secondary | ICD-10-CM

## 2017-03-18 DIAGNOSIS — I2510 Atherosclerotic heart disease of native coronary artery without angina pectoris: Secondary | ICD-10-CM | POA: Diagnosis not present

## 2017-03-18 LAB — PREALBUMIN: Prealbumin: 12.2 mg/dL — ABNORMAL LOW (ref 18–38)

## 2017-03-18 LAB — COMPREHENSIVE METABOLIC PANEL
ALT: 13 U/L — ABNORMAL LOW (ref 17–63)
ANION GAP: 9 (ref 5–15)
AST: 16 U/L (ref 15–41)
Albumin: 2.9 g/dL — ABNORMAL LOW (ref 3.5–5.0)
Alkaline Phosphatase: 105 U/L (ref 38–126)
BUN: 15 mg/dL (ref 6–20)
CALCIUM: 7.9 mg/dL — AB (ref 8.9–10.3)
CO2: 28 mmol/L (ref 22–32)
CREATININE: 1.9 mg/dL — AB (ref 0.61–1.24)
Chloride: 112 mmol/L — ABNORMAL HIGH (ref 101–111)
GFR calc Af Amer: 36 mL/min — ABNORMAL LOW (ref >60)
GFR calc non Af Amer: 31 mL/min — ABNORMAL LOW (ref >60)
Glucose, Bld: 124 mg/dL — ABNORMAL HIGH (ref 65–99)
POTASSIUM: 3.4 mmol/L — AB (ref 3.5–5.1)
SODIUM: 149 mmol/L — AB (ref 135–145)
Total Bilirubin: 0.8 mg/dL (ref 0.3–1.2)
Total Protein: 5.3 g/dL — ABNORMAL LOW (ref 6.5–8.1)

## 2017-03-18 LAB — CBC
HCT: 39.9 % (ref 39.0–52.0)
Hemoglobin: 12.6 g/dL — ABNORMAL LOW (ref 13.0–17.0)
MCH: 30.2 pg (ref 26.0–34.0)
MCHC: 31.6 g/dL (ref 30.0–36.0)
MCV: 95.7 fL (ref 78.0–100.0)
PLATELETS: 124 10*3/uL — AB (ref 150–400)
RBC: 4.17 MIL/uL — AB (ref 4.22–5.81)
RDW: 18.1 % — AB (ref 11.5–15.5)
WBC: 9.7 10*3/uL (ref 4.0–10.5)

## 2017-03-18 LAB — BASIC METABOLIC PANEL
ANION GAP: 8 (ref 5–15)
Anion gap: 8 (ref 5–15)
Anion gap: 8 (ref 5–15)
BUN: 15 mg/dL (ref 6–20)
BUN: 15 mg/dL (ref 6–20)
BUN: 14 mg/dL (ref 6–20)
CHLORIDE: 113 mmol/L — AB (ref 101–111)
CO2: 29 mmol/L (ref 22–32)
CO2: 28 mmol/L (ref 22–32)
CO2: 28 mmol/L (ref 22–32)
CREATININE: 1.77 mg/dL — AB (ref 0.61–1.24)
Calcium: 7.9 mg/dL — ABNORMAL LOW (ref 8.9–10.3)
Calcium: 7.8 mg/dL — ABNORMAL LOW (ref 8.9–10.3)
Calcium: 7.9 mg/dL — ABNORMAL LOW (ref 8.9–10.3)
Chloride: 112 mmol/L — ABNORMAL HIGH (ref 101–111)
Chloride: 113 mmol/L — ABNORMAL HIGH (ref 101–111)
Creatinine, Ser: 1.93 mg/dL — ABNORMAL HIGH (ref 0.61–1.24)
Creatinine, Ser: 1.88 mg/dL — ABNORMAL HIGH (ref 0.61–1.24)
GFR calc Af Amer: 36 mL/min — ABNORMAL LOW (ref >60)
GFR calc Af Amer: 37 mL/min — ABNORMAL LOW (ref >60)
GFR calc Af Amer: 39 mL/min — ABNORMAL LOW (ref >60)
GFR calc non Af Amer: 31 mL/min — ABNORMAL LOW (ref >60)
GFR calc non Af Amer: 32 mL/min — ABNORMAL LOW (ref >60)
GFR calc non Af Amer: 34 mL/min — ABNORMAL LOW (ref >60)
GLUCOSE: 109 mg/dL — AB (ref 65–99)
GLUCOSE: 108 mg/dL — AB (ref 65–99)
Glucose, Bld: 126 mg/dL — ABNORMAL HIGH (ref 65–99)
POTASSIUM: 3.4 mmol/L — AB (ref 3.5–5.1)
Potassium: 3.3 mmol/L — ABNORMAL LOW (ref 3.5–5.1)
Potassium: 3.2 mmol/L — ABNORMAL LOW (ref 3.5–5.1)
SODIUM: 150 mmol/L — AB (ref 135–145)
SODIUM: 148 mmol/L — AB (ref 135–145)
SODIUM: 149 mmol/L — AB (ref 135–145)

## 2017-03-18 LAB — ECHOCARDIOGRAM COMPLETE
Height: 71 in
Weight: 2649.05 oz

## 2017-03-18 LAB — TSH: TSH: 2.192 u[IU]/mL (ref 0.350–4.500)

## 2017-03-18 LAB — TROPONIN I
Troponin I: <0.03 ng/mL (ref <0.03)
Troponin I: 0.03 ng/mL (ref <0.03)

## 2017-03-18 LAB — MAGNESIUM: MAGNESIUM: 1.6 mg/dL — AB (ref 1.7–2.4)

## 2017-03-18 LAB — PHOSPHORUS: Phosphorus: 3.4 mg/dL (ref 2.5–4.6)

## 2017-03-18 LAB — SODIUM, URINE, RANDOM: Sodium, Ur: 40 mmol/L

## 2017-03-18 MED ORDER — KCL IN DEXTROSE-NACL 20-5-0.45 MEQ/L-%-% IV SOLN
INTRAVENOUS | Status: DC
  Administered 2017-03-18: 22:00:00 via INTRAVENOUS
  Filled 2017-03-18 (×2): qty 1000

## 2017-03-18 MED ORDER — CARVEDILOL 6.25 MG PO TABS
6.2500 mg | ORAL_TABLET | Freq: Two times a day (BID) | ORAL | Status: DC
  Administered 2017-03-19 – 2017-03-20 (×4): 6.25 mg via ORAL
  Filled 2017-03-18 (×4): qty 1

## 2017-03-18 MED ORDER — CARVEDILOL 6.25 MG PO TABS: 6.2500 mg | ORAL_TABLET | Freq: Two times a day (BID) | ORAL | 11 refills | Status: DC

## 2017-03-18 MED ORDER — FUROSEMIDE 20 MG PO TABS: 20.0000 mg | ORAL_TABLET | Freq: Every day | ORAL | 0 refills | Status: DC | PRN

## 2017-03-18 MED ORDER — MAGIC MOUTHWASH: 5.0000 mL | Freq: Three times a day (TID) | ORAL | 0 refills | Status: DC

## 2017-03-18 MED ORDER — POTASSIUM CHLORIDE CRYS ER 20 MEQ PO TBCR
40.0000 meq | EXTENDED_RELEASE_TABLET | Freq: Once | ORAL | Status: AC
  Administered 2017-03-18: 40 meq via ORAL
  Filled 2017-03-18: qty 2

## 2017-03-18 MED ORDER — MAGNESIUM SULFATE 2 GM/50ML IV SOLN
2.0000 g | Freq: Once | INTRAVENOUS | Status: AC
  Administered 2017-03-18: 2 g via INTRAVENOUS
  Filled 2017-03-18: qty 50

## 2017-03-18 NOTE — Progress Notes (Signed)
*  PRELIMINARY RESULTS* Vascular Ultrasound Bilateral lower extremity venous duplex has been completed.  Preliminary findings: No evidence of deep vein thrombosis or baker's cysts bilaterally.   [name] 03/18/2017, 1:48 PM

## 2017-03-18 NOTE — Care Management Obs Status (Signed)
[facility] NOTIFICATION   Patient Details  Name: [name]. MRN: [id number] Date of Birth: [birth year]   Medicare Observation Status Notification Given:  Yes    Dessa Phi, RN 03/18/2017, 1:13 PM

## 2017-03-18 NOTE — Progress Notes (Signed)
   03/18/17 1254  PT Time Calculation  PT Start Time (ACUTE ONLY) 1230  PT Stop Time (ACUTE ONLY) 1238  PT Time Calculation (min) (ACUTE ONLY) 8 min  PT G-Codes **NOT FOR INPATIENT CLASS**  Functional Assessment Tool Used AM-PAC 6 Clicks Basic Mobility;Clinical judgement  Functional Limitation Mobility: Walking and moving around  Mobility: Walking and Moving Around Current Status (O1607) CI  Mobility: Walking and Moving Around Goal Status (P7106) CI  Mobility: Walking and Moving Around Discharge Status (Y6948) CI  PT General Charges  $$ ACUTE PT VISIT 1 Procedure  PT Evaluation  $PT Eval Low Complexity 1 Procedure   Weston Anna, MPT [phone]

## 2017-03-18 NOTE — Progress Notes (Signed)
Initial Nutrition Assessment  DOCUMENTATION CODES:   Severe malnutrition in context of acute illness/injury  INTERVENTION:   -Provide Magic cup TID with meals, each supplement provides 290 kcal and 9 grams of protein -Provide plastic silverware only with meal trays -Encourage PO intake -RD to continue to monitor  NUTRITION DIAGNOSIS:   Malnutrition (Severe) related to cancer and cancer related treatments, acute illness as evidenced by percent weight loss, energy intake < or equal to 75% for > or equal to 1 month, moderate depletion of body fat, moderate depletions of muscle mass.  GOAL:   Patient will meet greater than or equal to 90% of their needs  MONITOR:   PO intake, Supplement acceptance, Labs, Weight trends, I & O's  REASON FOR ASSESSMENT:   Consult Assessment of nutrition requirement/status  ASSESSMENT:   83 y.o. male with medical history significant of Mantle cell lymphoma, hypertension, dyslipidemia, psoriasis, fibromyalgia, coronary artery disease status post PCI in 2009, GERD  Patient in room with no family at bedside. Pt flat in affect and did not provide much history at time of visit. Pt states he has had poor appetite and has not been eating at home. Pt states he has Boost supplements at home but lately has not been drinking them d/t taste changes. Pt ate an icee today and states it tasted like medicine. Pt states he feels like he needs to vomit d/t the amount of saliva in his mouth. + for nausea at this time. Pt would like plastic silverware with his meals and snacks. Will designate in diet order.   Per chart review, pt has lost 20 lb since 4/9 (11% wt loss x 1.5 months, significant for time frame). Nutrition-Focused physical exam completed. Findings are moderate fat depletion, moderate muscle depletion, and no edema.   Medications: Protonix tablet daily, K-DUR tablet once Labs reviewed: Elevated Na Low K, Mg GFR: 32  Diet Order:  Diet Heart Room service  appropriate? Yes; Fluid consistency: Thin  Skin:  Reviewed, no issues  Last BM:  5/22  Height:   Ht Readings from Last 1 Encounters:  03/17/17 5\' 11"  (1.803 m)    Weight:   Wt Readings from Last 1 Encounters:  03/17/17 165 lb 9.1 oz (75.1 kg)    Ideal Body Weight:  78.2 kg  BMI:  Body mass index is 23.09 kg/m.  Estimated Nutritional Needs:   Kcal:  2250-2450  Protein:  110-120g  Fluid:  2.2L/day  EDUCATION NEEDS:   No education needs identified at this time  Clayton Bibles, MS, RD, LDN Pager: [phone] After Hours Pager: [phone]

## 2017-03-18 NOTE — Evaluation (Signed)
Physical Therapy Evaluation Patient Details Name: [name]. MRN: [id number] DOB: 1933-04-23 Today's Date: 03/18/2017   History of Present Illness  83 yo male admitted with CAD,SOB. Hx of fibromyalgia, lymphoma, CAD.   Clinical Impression  On eval, pt was Supervision-Min guard assist for mobility. He walked ~115 feet in hallway. O2 sat 97% on RA, HR 74 bpm during ambulation. Pt was intermittently unsteady. Instructed pt to use cane or walker as needed. Discussed d/c plan-pt stated he will return home with wife.     Follow Up Recommendations No PT follow up;Supervision for mobility/OOB (initially until pt returns to baseline)    Equipment Recommendations  None recommended by PT    Recommendations for Other Services       Precautions / Restrictions Precautions Precautions: Fall Restrictions Weight Bearing Restrictions: No      Mobility  Bed Mobility               General bed mobility comments: NT-pt sitting EOB  Transfers Overall transfer level: Needs assistance Equipment used: None Transfers: Sit to/from Stand Sit to Stand: Supervision         General transfer comment: for safety  Ambulation/Gait Ambulation/Gait assistance: Min guard Ambulation Distance (Feet): 115 Feet Assistive device: None (int "furniture walking") Gait Pattern/deviations: Decreased stride length;Step-through pattern     General Gait Details: Unsteady at times-pt reaching out for hallway handrail vs IV pole. Pt tolerated distance well. O2 sat 97% on RA, HR 74 bpm during ambulation. Pt tolerated distance well.   Stairs            Wheelchair Mobility    Modified Rankin (Stroke Patients Only)       Balance                                             Pertinent Vitals/Pain Pain Assessment: No/denies pain    Home Living Family/patient expects to be discharged to:: Private residence Living Arrangements: Spouse/significant other Available Help  at Discharge: Family Type of Home: House Home Access: Stairs to enter Entrance Stairs-Rails: None Entrance Stairs-Number of Steps: 2 Home Layout: One level Home Equipment: Environmental consultant - 2 wheels;Cane - single point      Prior Function Level of Independence: Independent               Hand Dominance        Extremity/Trunk Assessment   Upper Extremity Assessment Upper Extremity Assessment: Generalized weakness    Lower Extremity Assessment Lower Extremity Assessment: Generalized weakness    Cervical / Trunk Assessment Cervical / Trunk Assessment: Kyphotic  Communication   Communication: No difficulties  Cognition Arousal/Alertness: Awake/alert Behavior During Therapy: WFL for tasks assessed/performed Overall Cognitive Status: Within Functional Limits for tasks assessed                                        General Comments      Exercises     Assessment/Plan    PT Assessment Patient needs continued PT services  PT Problem List Decreased mobility       PT Treatment Interventions Gait training;Therapeutic exercise;Patient/family education;Functional mobility training    PT Goals (Current goals can be found in the Care Plan section)  Acute Rehab PT Goals Patient Stated Goal: home PT Goal  Formulation: With patient Time For Goal Achievement: 04/01/17 Potential to Achieve Goals: Good    Frequency Min 3X/week   Barriers to discharge        Co-evaluation               AM-PAC PT "6 Clicks" Daily Activity  Outcome Measure Difficulty turning over in bed (including adjusting bedclothes, sheets and blankets)?: None Difficulty moving from lying on back to sitting on the side of the bed? : None Difficulty sitting down on and standing up from a chair with arms (e.g., wheelchair, bedside commode, etc,.)?: A Little Help needed moving to and from a bed to chair (including a wheelchair)?: A Little Help needed walking in hospital room?: A  Little Help needed climbing 3-5 steps with a railing? : A Little 6 Click Score: 20    End of Session Equipment Utilized During Treatment: Gait belt Activity Tolerance: Patient tolerated treatment well Patient left: with nursing/sitter in room (up moving in room)   PT Visit Diagnosis: Muscle weakness (generalized) (M62.81);Difficulty in walking, not elsewhere classified (R26.2)    Time: 1121-6244 PT Time Calculation (min) (ACUTE ONLY): 8 min   Charges:   PT Evaluation $PT Eval Low Complexity: 1 Procedure     PT G Codes:         Weston Anna, MPT Pager: [phone]

## 2017-03-18 NOTE — Care Management Note (Signed)
Case Management Note  Patient Details  Name: [name]. MRN: [id number] Date of Birth: [birth year]  Subjective/Objective: 83 y/o m admitted w/CAD. From home w/spouse, has rw,cane,w/c.PT no f/u. HHRN/aide ordered. Patient chose [facility] for [place] aware of orders, & d/c. No further CM needs.                   Action/Plan:d/c home w/HHC.   Expected Discharge Date:   (unknown)               Expected Discharge Plan:  [place]  In-House Referral:     Discharge planning Services  CM Consult  Post Acute Care Choice:    Choice offered to:  Patient  DME Arranged:    DME Agency:     HH Arranged:  RN, Nurse's Aide [place] Agency:  [place]  Status of Service:  Completed, signed off  If discussed at [place] of Stay Meetings, dates discussed:    Additional Comments:  Dessa Phi, RN 03/18/2017, 2:27 PM

## 2017-03-18 NOTE — Progress Notes (Addendum)
PROGRESS NOTE  [name]. SFK:[id number] DOB: 03-19-1933 DOA: 03/17/2017 PCP: Eulas Post, MD   Brief Summary:  h/o GI Mantle cell lymphoma receiving chemo, hypertension, psoriasis, fibromyalgia, CAD s/p PCI in 2009, admitted for  dehydration, hypernatremia, aki dyspnea, FTT. 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 SVT vs sinus tachycardia, he is also very deconditioned and dehydrated.  -Echo EF 40-45% , hypokiness of the inferolateal myocardium, grade I diastolic dysfunction. Right ventricle:  The cavity size was mildly dilated. Wall thickness was normal. Systolic function was normal. Pulmonary artery:   The main pulmonary artery was normal-sized. Systolic pressure was within the normal range. -He does has active cancer, there is a concern about PE, he can not get cta, echo as above, venous doppler no DVT, VQ scan pending, he is not a good candidate for anticoagulation due to h/o gi bleed and frailty --Outpatient cardiac monitoring for rule out arrhythemia , change from atenolol to coreg due to renal impairment and h/o chf  Hypokalemia/hypomagnesemia: replace k and mag  Hypernatremia -likely due to decreased p.o. intake and dehydration , he is started on D5 half normal saline since admission.    AKI (acute kidney injury) ([place]) Cr started to increase from 5/17 likely secondary to dehydration,  ua no infection, no blood, no protein Renal ultrasound  unremarkable hold Lasix and lisinopril  Dehydration-orthostasis, hold Lasix, on ivf.   Essential hypertension with orthostatic hypotension, hold lisinoprol, on ivf, changed atenolol to coreg.   Hyperlipidemia - continue home medications  Coronary artery disease He presented with sudden onset of dyspnea, no chest pain, no hypoxia, continue on tele.  continue home medication, negative cardiac enzymes. No acute EKG changes  H/o combined CHF:  --Presented with dehydration --echo result as above,  --atenolol changed to coreg, lisinopril discontinued due to renal impairment, may be able to resume in the near future on outpatient basis if renal function normalized.   Thrush? 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 Nutrition-Focused physical exam completed. Findings are moderate fat depletion, moderate muscle depletion, and no edema. "  FTT: home health arranged  DVT prophylaxis:Lovenox   Code Status:FULL CODE as per patient    Code Status: full  Family Communication: patient  And wife at bedside  Disposition Plan:  home with home health on 5/23 if medically stable, pending VQ scan result   Consultants:  Oncology Dr Irene Limbo  Procedures:  V/Q scan  Antibiotics:  none  Objective: BP (!) 141/66 (BP Location: Left Arm)   Pulse 79   Temp 98.3 F (36.8 C) (Oral)   Resp 18   Ht 5\' 11"  (1.803 m)   Wt  75.1 kg (165 lb 9.1 oz)   SpO2 100%   BMI 23.09 kg/m   Intake/Output Summary (Last 24 hours) at 03/18/17 1619 Last data filed at 03/18/17 1618  Gross per 24 hour  Intake             1460 ml  Output             1875 ml  Net             -415 ml   Filed Weights   03/17/17 1534 03/17/17 2108  Weight: 80.7 kg (178 lb) 75.1 kg (165 lb 9.1 oz)    Exam:   General:  Frail chronically ill appearing  Cardiovascular: RRR  Respiratory: diminished, but no rales, no rhonchi, no wheezing  Abdomen: Soft/ND/NT, positive BS  Musculoskeletal: No Edema  Neuro: aaox3, flat affect  Data Reviewed: Basic Metabolic Panel:  Recent Labs Lab 03/13/17 0919 03/17/17 1605 03/17/17 2212 03/18/17 0421 03/18/17 0958 03/18/17 1548  NA  --  150* 150* 149*  150* 148* 149*  K  --  3.2* 3.6 3.4*  3.4* 3.3* 3.2*  CL  --  113* 112* 112*  113* 112* 113*  CO2  --  28 28 28  29 28 28   GLUCOSE  --  105* 165* 124*  126* 109* 108*  BUN  --  17 17 15  15 15 14   CREATININE  --  2.09* 2.00* 1.90*  1.93* 1.88* 1.77*  CALCIUM  --  8.1* 8.3* 7.9*  7.9* 7.8* 7.9*  MG  --   --   --  1.6*  --   --   PHOS 3.5  --   --  3.4  --   --    Liver Function Tests:  Recent Labs Lab 03/13/17 0814 03/17/17 1605 03/18/17 0421  AST 15 16 16   ALT 13 14* 13*  ALKPHOS 129 111 105  BILITOT 0.70 0.7 0.8  PROT 4.9* 5.6* 5.3*  ALBUMIN 2.5* 3.0* 2.9*   No results for input(s): LIPASE, AMYLASE in the last 168 hours. No results for input(s): AMMONIA in the last 168 hours. CBC:  Recent Labs Lab 03/13/17 0814 03/17/17 1605 03/18/17 0421  WBC 19.3* 11.2* 9.7  NEUTROABS 16.9* 9.2*  --   HGB 12.7* 13.2 12.6*  HCT 39.2 41.2 39.9  MCV 94.2 95.4 95.7  PLT 172 126* 124*   Cardiac Enzymes:    Recent Labs Lab 03/17/17 1902 03/18/17 0042 03/18/17 0645  TROPONINI <0.03 <0.03 0.03*   BNP (last 3 results)  Recent Labs  03/17/17 1605  BNP 132.9*    ProBNP (last 3 results) No results for input(s): PROBNP  in the last 8760 hours.  CBG: No results for input(s): GLUCAP in the last 168 hours.  No results found for this or any previous visit (from the past 240 hour(s)).   Studies: US Renal  Result Date: 03/18/2017 CLINICAL DATA:  Elevated creatinine EXAM: RENAL / URINARY TRACT ULTRASOUND COMPLETE COMPARISON:  PET-CT 01/30/2017 FINDINGS: Right Kidney: Length: 11.2 cm. Echogenicity within normal limits. No mass or hydronephrosis visualized. Left Kidney: Length: 13 cm. Echogenicity within normal limits. No mass or hydronephrosis visualized. Bladder: Appears normal for degree of bladder distention. IMPRESSION: Negative renal ultrasound Electronically Signed   By: Madie Reno.D.  On: 03/18/2017 15:16    Scheduled Meds: . allopurinol  200 mg Oral Daily  . [START ON 03/19/2017] carvedilol  6.25 mg Oral BID WC  . enoxaparin (LOVENOX) injection  30 mg Subcutaneous Q24H  . guaiFENesin  600 mg Oral BID  . nystatin  5 mL Mouth/Throat QID  . pantoprazole  40 mg Oral Daily  . rosuvastatin  10 mg Oral Daily  . sodium chloride flush  3 mL Intravenous Q12H    Continuous Infusions: . magnesium sulfate 1 - 4 g bolus IVPB       Time spent: 42mins  Braddock Servellon MD, PhD  [facility] Pager [phone]. If 7PM-7AM, please contact night-coverage at [email or web address], password [facility] 03/18/2017, 4:19 PM  LOS: 0 days

## 2017-03-18 NOTE — Progress Notes (Signed)
  Echocardiogram 2D Echocardiogram has been performed.  [name] 03/18/2017, 1:39 PM

## 2017-03-19 ENCOUNTER — Inpatient Hospital Stay (HOSPITAL_COMMUNITY): Payer: Medicare Other

## 2017-03-19 DIAGNOSIS — E43 Unspecified severe protein-calorie malnutrition: Secondary | ICD-10-CM

## 2017-03-19 DIAGNOSIS — E876 Hypokalemia: Secondary | ICD-10-CM

## 2017-03-19 DIAGNOSIS — I2510 Atherosclerotic heart disease of native coronary artery without angina pectoris: Secondary | ICD-10-CM

## 2017-03-19 DIAGNOSIS — E860 Dehydration: Secondary | ICD-10-CM

## 2017-03-19 DIAGNOSIS — I951 Orthostatic hypotension: Secondary | ICD-10-CM

## 2017-03-19 DIAGNOSIS — R7989 Other specified abnormal findings of blood chemistry: Secondary | ICD-10-CM

## 2017-03-19 DIAGNOSIS — N179 Acute kidney failure, unspecified: Principal | ICD-10-CM

## 2017-03-19 DIAGNOSIS — C8289 Other types of follicular lymphoma, extranodal and solid organ sites: Secondary | ICD-10-CM

## 2017-03-19 DIAGNOSIS — R0600 Dyspnea, unspecified: Secondary | ICD-10-CM

## 2017-03-19 DIAGNOSIS — R0602 Shortness of breath: Secondary | ICD-10-CM

## 2017-03-19 DIAGNOSIS — E782 Mixed hyperlipidemia: Secondary | ICD-10-CM

## 2017-03-19 DIAGNOSIS — B370 Candidal stomatitis: Secondary | ICD-10-CM

## 2017-03-19 DIAGNOSIS — I10 Essential (primary) hypertension: Secondary | ICD-10-CM

## 2017-03-19 DIAGNOSIS — E870 Hyperosmolality and hypernatremia: Secondary | ICD-10-CM

## 2017-03-19 LAB — CBC WITH DIFFERENTIAL/PLATELET
Basophils Absolute: 0 10*3/uL (ref 0.0–0.1)
Basophils Relative: 0 %
EOS ABS: 0.2 10*3/uL (ref 0.0–0.7)
EOS PCT: 2 %
HCT: 40.5 % (ref 39.0–52.0)
Hemoglobin: 12.5 g/dL — ABNORMAL LOW (ref 13.0–17.0)
LYMPHS ABS: 1 10*3/uL (ref 0.7–4.0)
Lymphocytes Relative: 14 %
MCH: 29.9 pg (ref 26.0–34.0)
MCHC: 30.9 g/dL (ref 30.0–36.0)
MCV: 96.9 fL (ref 78.0–100.0)
MONO ABS: 0.5 10*3/uL (ref 0.1–1.0)
MONOS PCT: 7 %
Neutro Abs: 5.7 10*3/uL (ref 1.7–7.7)
Neutrophils Relative %: 77 %
PLATELETS: 124 10*3/uL — AB (ref 150–400)
RBC: 4.18 MIL/uL — ABNORMAL LOW (ref 4.22–5.81)
RDW: 18.1 % — AB (ref 11.5–15.5)
WBC: 7.5 10*3/uL (ref 4.0–10.5)

## 2017-03-19 LAB — BASIC METABOLIC PANEL
ANION GAP: 6 (ref 5–15)
BUN: 11 mg/dL (ref 6–20)
CO2: 28 mmol/L (ref 22–32)
Calcium: 8.2 mg/dL — ABNORMAL LOW (ref 8.9–10.3)
Chloride: 117 mmol/L — ABNORMAL HIGH (ref 101–111)
Creatinine, Ser: 1.57 mg/dL — ABNORMAL HIGH (ref 0.61–1.24)
GFR, EST AFRICAN AMERICAN: 46 mL/min — AB (ref >60)
GFR, EST NON AFRICAN AMERICAN: 39 mL/min — AB (ref >60)
Glucose, Bld: 112 mg/dL — ABNORMAL HIGH (ref 65–99)
POTASSIUM: 3.3 mmol/L — AB (ref 3.5–5.1)
SODIUM: 151 mmol/L — AB (ref 135–145)

## 2017-03-19 LAB — MAGNESIUM: Magnesium: 2 mg/dL (ref 1.7–2.4)

## 2017-03-19 LAB — URIC ACID: Uric Acid, Serum: 5 mg/dL (ref 4.4–7.6)

## 2017-03-19 MED ORDER — TECHNETIUM TO 99M ALBUMIN AGGREGATED
3.9000 | Freq: Once | INTRAVENOUS | Status: AC | PRN
  Administered 2017-03-19: 3.9 via INTRAVENOUS

## 2017-03-19 MED ORDER — POTASSIUM CHLORIDE CRYS ER 20 MEQ PO TBCR
40.0000 meq | EXTENDED_RELEASE_TABLET | Freq: Once | ORAL | Status: AC
  Administered 2017-03-19: 40 meq via ORAL
  Filled 2017-03-19: qty 2

## 2017-03-19 MED ORDER — TECHNETIUM TC 99M DIETHYLENETRIAME-PENTAACETIC ACID
31.4000 | Freq: Once | INTRAVENOUS | Status: AC | PRN
  Administered 2017-03-19: 31.4 via INTRAVENOUS

## 2017-03-19 MED ORDER — POTASSIUM CL IN DEXTROSE 5% 20 MEQ/L IV SOLN: 20.0000 meq | INTRAVENOUS | Status: DC

## 2017-03-19 MED ORDER — ENOXAPARIN SODIUM 40 MG/0.4ML ~~LOC~~ SOLN: 40.0000 mg | SUBCUTANEOUS | Status: DC

## 2017-03-19 MED ORDER — POTASSIUM CL IN DEXTROSE 5% 20 MEQ/L IV SOLN
20.0000 meq | INTRAVENOUS | Status: DC
  Administered 2017-03-19 – 2017-03-20 (×2): 20 meq via INTRAVENOUS
  Filled 2017-03-19 (×3): qty 1000

## 2017-03-19 NOTE — Progress Notes (Signed)
[name]   HEMATOLOGY/ONCOLOGY INPATIENT PROGRESS NOTE  Date of Service: 03/19/2017  Inpatient Attending: .Dr Florencia Reasons MD  SUBJECTIVE  I was consulted to see the patient in the hospital by Dr.Xu. Patient has a history of mantle cell lymphoma Stage IVBE and recently received his first cycle of bendamustine and Rituxan chemotherapy immunotherapy on 03/04/2017.  He was admitted to the hospital with brief shortness of breath. He also has been eating poorly and notes some cotton mouth like feeling - and we'll did have some mild mucositis and thrush. He came in quitting dehydrated with hypernatremia and a bump in his creatinine. Labs do not suggest overt tumor lysis syndrome. Patient notes his diarrhea has resolved. Mild nausea but no overt vomiting.  Acute coronary syndrome was being ruled out given his history of coronary disease status post PCI . Patient had an echocardiogram that shows ejection fraction of 40-45% with seems to be unchanged from his echo in October 2016  Ultrasound of his bilateral lower extremities ruled out DVT. His neck swelling is better.  Patient currently not having any overt chest pain or shortness of breath. Appears to be fading somewhat emotionally depressed. We discussed this in details and the importance of him trying to eat as well as he can.  OBJECTIVE:  NAD  PHYSICAL EXAMINATION: . Vitals:   03/18/17 1222 03/18/17 1807 03/18/17 2201 03/19/17 0457  BP:  139/83 (!) 126/57 (!) 147/73  Pulse:  62 69 61  Resp:  16 18 18   Temp: 98.3 F (36.8 C) 98.1 F (36.7 C) 98.4 F (36.9 C) 97.9 F (36.6 C)  TempSrc: Oral Oral Oral Oral  SpO2: 100% 96% 98% 100%  Weight:      Height:       Filed Weights   03/17/17 1534 03/17/17 2108  Weight: 178 lb (80.7 kg) 165 lb 9.1 oz (75.1 kg)   .Body mass index is 23.09 kg/m.  GENERAL:alert, in no acute distress and Appears somewhat emotionally flat SKIN: No acute rashes  EYES: normal, conjunctiva are pink and non-injected,  sclera clear OROPHARYNX: Mild mucositis and a few pharyngeal exudates suggestive of thrush.  NECK: supple, no JVD, LYMPH:  no palpable lymphadenopathy in the cervical, axillary or inguinal LUNGS: clear to auscultation with normal respiratory effort HEART: regular rate & rhythm,  no murmurs and 1+ pitting lower extremity edema ABDOMEN: abdomen soft, non-tender, normoactive bowel sounds  Musculoskeletal: no cyanosis of digits and no clubbing  PSYCH: alert & oriented x 3 with fluent speech NEURO: no focal motor/sensory deficits  MEDICAL HISTORY:  Past Medical History:  Diagnosis Date  . Coronary artery disease    a. stent (promus) Cx/OM'09  . Fibromyalgia dx'd ~ 04/2015  . GERD (gastroesophageal reflux disease)   . Gout   . HEARING LOSS    "temporary"  . Hyperlipidemia   . HYPERLIPIDEMIA 10/13/2007  . Hypertension   . HYPERTENSION 10/13/2007  . Osteoarthritis    "left shoulder" (08/23/2015)  . Prostatitis, acute   . PROSTATITIS, ACUTE, HX OF 10/13/2007  . PSORIASIS 10/13/2007  . PVC (premature ventricular contraction)    hx  . SKIN CANCER, RECURRENT 10/13/2007    SURGICAL HISTORY: Past Surgical History:  Procedure Laterality Date  . APPENDECTOMY    . CORONARY ANGIOPLASTY WITH STENT PLACEMENT    . CYST EXCISION Right X 2   shoulder  . ELECTROLYSIS OF MISDIRECTED LASHES Bilateral    eyelashes are misdirected and grow inwards   . EYE SURGERY    .  MASS EXCISION Right 01/2005   proximal thigh soft tissue mass  . TEAR DUCT PROBING Left   . TYMPANOPLASTY Bilateral    "for hearing loss; put tubes in also, 2X on right, 1X on the left; tubes worked"  . VASECTOMY      SOCIAL HISTORY: Social History   Social History  . Marital status: Married    Spouse name: N/A  . Number of children: 3  . Years of education: N/A   Occupational History  . retired Retired    from [employer].   Social History Main Topics  . Smoking status: Never Smoker  . Smokeless tobacco: Never Used  .  Alcohol use No  . Drug use: No  . Sexual activity: No   Other Topics Concern  . Not on file   Social History Narrative   Married 1958   2 daughters- '59, 68, 1 son '61   8 grandchildren   Retired from [employer], works PT as a Curator. Now retired completely (09/2008)   End of life; does not want heroic measures if in a persistent vegative state    FAMILY HISTORY: Family History  Problem Relation Age of Onset  . Heart attack Father 54       died  . Heart disease Father   . Parkinsonism Mother 32       died  . Breast cancer Sister        died  . Lung cancer Unknown        uncle died  . Colon cancer Neg Hx     ALLERGIES:  is allergic to niacin.  MEDICATIONS:  Scheduled Meds: . allopurinol  200 mg Oral Daily  . carvedilol  6.25 mg Oral BID WC  . enoxaparin (LOVENOX) injection  30 mg Subcutaneous Q24H  . guaiFENesin  600 mg Oral BID  . nystatin  5 mL Mouth/Throat QID  . pantoprazole  40 mg Oral Daily  . rosuvastatin  10 mg Oral Daily  . sodium chloride flush  3 mL Intravenous Q12H   Continuous Infusions: . dextrose 5 % and 0.45 % NaCl with KCl 20 mEq/L 75 mL/hr at 03/18/17 2154   PRN Meds:.acetaminophen **OR** acetaminophen, albuterol, HYDROcodone-acetaminophen, ondansetron **OR** ondansetron (ZOFRAN) IV, prochlorperazine  REVIEW OF SYSTEMS:    10 Point review of Systems was done is negative except as noted above.   LABORATORY DATA:  I have reviewed the data as listed  . CBC Latest Ref Rng & Units 03/18/2017 03/17/2017 03/13/2017  WBC 4.0 - 10.5 K/uL 9.7 11.2(H) 19.3(H)  Hemoglobin 13.0 - 17.0 g/dL 12.6(L) 13.2 12.7(L)  Hematocrit 39.0 - 52.0 % 39.9 41.2 39.2  Platelets 150 - 400 K/uL 124(L) 126(L) 172    . CMP Latest Ref Rng & Units 03/18/2017 03/18/2017  Glucose 65 - 99 mg/dL 108(H) 109(H)  BUN 6 - 20 mg/dL 14 15  Creatinine 0.61 - 1.24 mg/dL 1.77(H) 1.88(H)  Sodium 135 - 145 mmol/L 149(H) 148(H)  Potassium 3.5 - 5.1 mmol/L 3.2(L) 3.3(L)  Chloride 101 - 111  mmol/L 113(H) 112(H)  CO2 22 - 32 mmol/L 28 28  Calcium 8.9 - 10.3 mg/dL 7.9(L) 7.8(L)  Total Protein 6.5 - 8.1 g/dL - -  Total Bilirubin 0.3 - 1.2 mg/dL - -  Alkaline Phos 38 - 126 U/L - -  AST 15 - 41 U/L - -  ALT 17 - 63 U/L - -   Component     Latest Ref Rng & Units 03/18/2017 03/19/2017  Troponin I     <  0.03 ng/mL <0.03   PREALBUMIN     18 - 38 mg/dL 12.2 (L)   Magnesium     1.7 - 2.4 mg/dL 1.6 (L) 2.0  Phosphorus     2.5 - 4.6 mg/dL 3.4   TSH     0.350 - 4.500 uIU/mL 2.192   Uric Acid, Serum     4.4 - 7.6 mg/dL  5.0    RADIOGRAPHIC STUDIES: I have personally reviewed the radiological images as listed and agreed with the findings in the report. Dg Chest 2 View  Result Date: 03/17/2017 CLINICAL DATA:  5 minute episode of shortness of breath while walking today. History of non-Hodgkin's lymphoma. EXAM: CHEST  2 VIEW COMPARISON:  Chest radiograph September 27, 2016 FINDINGS: Cardiomediastinal silhouette is normal. No pleural effusions or focal consolidations. Unchanged strandy densities LEFT lung base. Trachea projects midline and there is no pneumothorax. Soft tissue planes and included osseous structures are non-suspicious. IMPRESSION: Stable LEFT lung base atelectasis/ scarring. Electronically Signed   By: Elon Alas M.D.   On: 03/17/2017 16:02   US Renal  Result Date: 03/18/2017 CLINICAL DATA:  Elevated creatinine EXAM: RENAL / URINARY TRACT ULTRASOUND COMPLETE COMPARISON:  PET-CT 01/30/2017 FINDINGS: Right Kidney: Length: 11.2 cm. Echogenicity within normal limits. No mass or hydronephrosis visualized. Left Kidney: Length: 13 cm. Echogenicity within normal limits. No mass or hydronephrosis visualized. Bladder: Appears normal for degree of bladder distention. IMPRESSION: Negative renal ultrasound Electronically Signed   By: Donavan Foil M.D.   On: 03/18/2017 15:16    ASSESSMENT & PLAN:   83 year old male with  #1 Newly diagnosed stage IVBE Mantle cell lymphoma He  has hypermetabolic lymphadenopathy and extensive bowel involvement. Has significant constitutional symptoms including debilitating fatigue , weight loss of about 40 pounds since November 2017, anorexia, some subjective chills.  #2 persistent bothersome diarrhea and some rectal bleeding. Hemoglobin is stable at this time. This appears to be likely related to his mantle cell lymphoma. Could also have an underlying intermittent disorder. Is on Canasa and hydrocortisone suppositories as per GI. Patient's diarrhea has pretty much resolved at this time. Only minimal hemorrhoidal bleeding at this time.  #3 moderate to severe protein calorie malnutrition with significant weight loss- due to lymphoma and diarrhea. Slowly improving nutritional status with still hypoalbuminemic. Currently patient admitted with dehydration with poor oral intake.  #4 AKI - no overt evidence of tumor lysis syndrome. This appears to be primarily related to dehydration from poor oral intake with associated hypernatremia suggesting a total body water deficit though he has some pedal edema. Renal ultrasound showed no overt evidence of hydronephrosis or obstructive uropathy.  #5 short of breath- echo shows unchanged chronic systolic CHF ejection fraction 40-45% with some grade 1 diastolic dysfunction. Troponins do not suggest acute coronary syndrome.  #6 poor oral intake and some mild mouth soreness. Appears to have some mild mucositis and oral thrush. Plan -Patient is off Lasix and potassium given his dehydration. -He is receiving IV fluids carefully with gradually improving renal insufficiency. -Will need dietary consultation to optimize his oral intake. -Will need PT OT evaluation to determine home needs on discharge. -Nystatin 4 times a day swish and swallow for his thrush. -Can use salt and baking soda mouthwash for swish/gargle and spit for mucositis. -Would hydrate the patient and keep him in the hospital until there  is resolution of his hypernatremia and renal insufficiency. -Continue use of compression socks for his leg swelling. -Referral to outpatient cancer rehabilitation. -Discussed issues surrounding  his emotional state and depression. Patient appears somewhat anxious with the diagnosis but declines any medications at this time. -Plan of care discussed with his wife at bedside and with Dr.Xu. -His second cycle of Bendamustine-Rituxan treatment is due as per schedule on 6/6 if his labs are stable.  -I shall continue following him while in the hospital.  -RTC with Dr Irene Limbo with labs C2D1on 04/02/2017 as per his appointment   I spent 30 minutes counseling the patient face to face. The total time spent in the appointment was 40 minutes and more than 50% was on counseling and direct patient cares.    Sullivan Lone MD [place] AAHIVMS [facility] Hematology/Oncology Physician [facility]  (Office):       [phone] (Work cell):  [phone] (Fax):           [phone]

## 2017-03-19 NOTE — Progress Notes (Signed)
PROGRESS NOTE    [name].  GMW:[id number] DOB: 1933/08/25 DOA: 03/17/2017 PCP: Jacob Post, MD   Brief Narrative:  Jacob Damaso. is a 83 y.o. male with medical history significant of Mantle cell lymphoma, hypertension, dyslipidemia, psoriasis, fibromyalgia, coronary artery disease status [name] PCI in 2009, GERD and other comorbids who presented to [facility] with a cc of SOB. Was recently started on Lasix and had decreased po intake due to generalized fatigue. Was admitted for Dehydration, Hypernatremia, and Dyspnea and  Is improving. Oncology Dr. Irene Limbo has been consulted for further evaluation and recommendations.   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 SVT vs sinus tachycardia,he is also very deconditionedand dehydrated.  -Echo EF 40-45% , hypokiness of the inferolateal myocardium, grade I diastolic dysfunction. Right ventricle: The cavity size was mildly dilated. Wall thickness was normal. Systolic function was normal. Pulmonary artery: The main pulmonary artery was normal-sized. Systolic pressure was within the normal range. -He does has active cancer, there is a concern about PE, he can not get cta, echo as above, venous doppler no DVT,  -VQ scan showed Normal Ventilation-Perfusion Lung Scan and no evidence to suggest PE, -He is not a good candidate for anticoagulation due to h/o gi bleed and frailty -C/w Albuterol Nebs 2.5 mg  q2hprn and with Guaifenesin 600 mg po BID -Outpatient cardiac monitoring for rule out arrhythmia -Changed Atenolol to Coreg 6.25 mg po BID due to renal impairment and h/o CHF  Hypokalemia/Hypomagnesemia  -Patient's Potassium Level was still low at 3.3 -Changed IVF to D5W + 20 mEQ at 75 mL/hr -Replete with Potassium Chloride 40 mEQ once -Mag Level was 2.0; Was given IV Mag Sulfate on 5/22 -Repeat CMP and Mag Level in AM  Hypernatremia/Hyperchloremia; worsening -Na+went from 149 -> 151 and Chloride went from 113 -> 117 -Likely due to decreased p.o. intake and dehydration -He was started on D5 half normal saline since admission and now changed to Strictly D5W + 20 mEQ of KCl.   -Repeat CMP in AM  AKI (acute kidney injury), improving -Cr started to increase from 5/17 -BUN/Cr went from 14/1.77 -> 11/1.57 -Likely secondary to dehydration,  -Urinalysis showed no infection, no blood, no protein -Renal ultrasound unremarkable -Hold Nephrotoxic Agents including Lasix and Lisinopril  Dehydration/Orthostasis -Hold Lasix and Continue with IVF.   Essential hypertensionwith orthostatic hypotension -Hold lisinoprol,  -C/w IVF -Changed Atenolol to Coreg 6.25 mg po BID.   Hyperlipidemia  -Continue home medication with Rosuvastatin 10 mg po Daily  CAD -He presented with sudden onset of dyspnea, no chest pain, no hypoxia, continue on tele.  -Continue Carvedilol 6.25 mg po BID -Patient had Negative Cardiac Enzymes. No acute EKG changes  H/o combined Systolic and Diastolic CHF:  -Presented with dehydration and appears to be still a little dehydrated -ECHO result as above,  -Atenolol changed to coreg, lisinopril discontinued due to renal impairment, may be able to resume  in the near future on outpatient basis if renal function normalized.  -Continue to Monitor Volemic Status   Thrush? 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 Nutrition-Focused physical exam completed. Findings are moderatefat depletion, moderatemuscle depletion, and noedema. " -Nutrition recommending Magic Cup TID with Meals as each supplement provides 290 kcal and 9 grams of protein -Continue to Monitor  Deconditioning and Generalized Weakness -Obtain PT/OT Evaluate and Treat -Patient refused this AM  DVT prophylaxis: Enoxaparin 40 mg sq q24h Code Status: FULL CODE Family Communication: No Family Present at Bedside Disposition Plan: Pending PT Evaluation; Will likely D/C Home in next 24-48 hours if stable and improved  Consultants:   Oncology Dr. Sullivan Lone   Procedures:  ECHOCARDIOGRAM ------------------------------------------------------------------- Study Conclusions  - Left ventricle: The cavity size was normal. Systolic function was   mildly to moderately reduced. The estimated ejection fraction was   in the range of 40% to 45%. Hypokinesis of the inferolateral   myocardium. Doppler parameters are consistent with abnormal left   ventricular relaxation (grade 1 diastolic dysfunction). - Aortic valve: There was trivial regurgitation. - Right ventricle: The cavity size was mildly dilated. Wall   thickness was normal.   Antimicrobials:  Anti-infectives     None     Subjective: Seen and examined and states SOB was better. Wanted to go home. No nausea or vomiting.   Objective: Vitals:   03/18/17 1807 03/18/17 2201 03/19/17 0457 03/19/17 1330  BP: 139/83 (!) 126/57 (!) 147/73 131/78  Pulse: 62 69 61 66  Resp: 16 18 18 17   Temp: 98.1 F (36.7 C) 98.4 F (36.9 C) 97.9 F (36.6 C) 98 F (36.7 C)  TempSrc: Oral Oral Oral Oral  SpO2: 96% 98% 100% 100%  Weight:      Height:        Intake/Output Summary (Last 24 hours) at 03/19/17 1417 Last data filed at 03/19/17 1331  Gross per 24 hour  Intake           1771.5 ml  Output             2750 ml  Net           -978.5 ml   Filed Weights   03/17/17 1534 03/17/17 2108  Weight: 80.7 kg (178 lb) 75.1 kg (165 lb 9.1 oz)   Examination: Physical Exam:  Constitutional: Elderly frail Caucasian male NAD and appears calm and comfortable Eyes: Llids and conjunctivae normal, sclerae anicteric  ENMT: External Ears, Nose appear normal. Grossly normal hearing. Mucous membranes are slightly dry.  Neck: Appears normal, supple, no cervical masses, normal ROM, no appreciable thyromegaly, no JVD Respiratory: Diminished to auscultation bilaterally, no wheezing, rales, rhonchi or crackles. Normal respiratory effort and patient is not tachypenic. No accessory muscle use.  Cardiovascular: RRR, no murmurs / rubs /  gallops. S1 and S2 auscultated. Mild extremity edema.  Abdomen: Soft, non-tender, non-distended. No masses palpated. No appreciable hepatosplenomegaly. Bowel sounds positive x4.  GU: Deferred. Musculoskeletal: No clubbing / cyanosis of digits/nails. No joint deformity upper and lower extremities.  Skin: No rashes, lesions, ulcers on limited skin evaluation. No induration; Warm and dry.  Neurologic: CN 2-12 grossly intact with no focal deficits. Romberg sign cerebellar reflexes not assessed.  Psychiatric: Normal judgment and insight. Alert and oriented x 3. Depressed mood and flat affect.   Data  Reviewed: I have personally reviewed following labs and imaging studies  CBC:  Recent Labs Lab 03/13/17 0814 03/17/17 1605 03/18/17 0421 03/19/17 0551  WBC 19.3* 11.2* 9.7 7.5  NEUTROABS 16.9* 9.2*  --  5.7  HGB 12.7* 13.2 12.6* 12.5*  HCT 39.2 41.2 39.9 40.5  MCV 94.2 95.4 95.7 96.9  PLT 172 126* 124* 453*   Basic Metabolic Panel:  Recent Labs Lab 03/13/17 0919  03/17/17 2212 03/18/17 0421 03/18/17 0958 03/18/17 1548 03/19/17 0551  NA  --   < > 150* 149*  150* 148* 149* 151*  K  --   < > 3.6 3.4*  3.4* 3.3* 3.2* 3.3*  CL  --   < > 112* 112*  113* 112* 113* 117*  CO2  --   < > 28 28  29 28 28 28   GLUCOSE  --   < > 165* 124*  126* 109* 108* 112*  BUN  --   < > 17 15  15 15 14 11   CREATININE  --   < > 2.00* 1.90*  1.93* 1.88* 1.77* 1.57*  CALCIUM  --   < > 8.3* 7.9*  7.9* 7.8* 7.9* 8.2*  MG  --   --   --  1.6*  --   --  2.0  PHOS 3.5  --   --  3.4  --   --   --   < > = values in this interval not displayed. GFR: Estimated Creatinine Clearance: 38.5 mL/min (A) (by C-G formula based on SCr of 1.57 mg/dL (H)). Liver Function Tests:  Recent Labs Lab 03/13/17 0814 03/17/17 1605 03/18/17 0421  AST 15 16 16   ALT 13 14* 13*  ALKPHOS 129 111 105  BILITOT 0.70 0.7 0.8  PROT 4.9* 5.6* 5.3*  ALBUMIN 2.5* 3.0* 2.9*   No results for input(s): LIPASE, AMYLASE in the last 168 hours. No results for input(s): AMMONIA in the last 168 hours. Coagulation Profile: No results for input(s): INR, PROTIME in the last 168 hours. Cardiac Enzymes:  Recent Labs Lab 03/17/17 1902 03/18/17 0042 03/18/17 0645  TROPONINI <0.03 <0.03 0.03*   BNP (last 3 results) No results for input(s): PROBNP in the last 8760 hours. HbA1C: No results for input(s): HGBA1C in the last 72 hours. CBG: No results for input(s): GLUCAP in the last 168 hours. Lipid Profile: No results for input(s): CHOL, HDL, LDLCALC, TRIG, CHOLHDL, LDLDIRECT in the last 72 hours. Thyroid Function  Tests:  Recent Labs  03/18/17 0421  TSH 2.192   Anemia Panel: No results for input(s): VITAMINB12, FOLATE, FERRITIN, TIBC, IRON, RETICCTPCT in the last 72 hours. Sepsis Labs: No results for input(s): PROCALCITON, LATICACIDVEN in the last 168 hours.  No results found for this or any previous visit (from the past 240 hour(s)).   Radiology Studies: Dg Chest 2 View  Result Date: 03/17/2017 CLINICAL DATA:  5 minute episode of shortness of breath while walking today. History of non-Hodgkin's lymphoma.  EXAM: CHEST  2 VIEW COMPARISON:  Chest radiograph September 27, 2016 FINDINGS: Cardiomediastinal silhouette is normal. No pleural effusions or focal consolidations. Unchanged strandy densities LEFT lung base. Trachea projects midline and there is no pneumothorax. Soft tissue planes and included osseous structures are non-suspicious. IMPRESSION: Stable LEFT lung base atelectasis/ scarring. Electronically Signed   By: Elon Alas M.D.   On: 03/17/2017 16:02   US Renal  Result Date: 03/18/2017 CLINICAL DATA:  Elevated creatinine EXAM: RENAL / URINARY TRACT ULTRASOUND COMPLETE COMPARISON:  PET-CT 01/30/2017 FINDINGS: Right Kidney: Length: 11.2 cm. Echogenicity within normal limits. No mass or hydronephrosis visualized. Left Kidney: Length: 13 cm. Echogenicity within normal limits. No mass or hydronephrosis visualized. Bladder: Appears normal for degree of bladder distention. IMPRESSION: Negative renal ultrasound Electronically Signed   By: Donavan Foil M.D.   On: 03/18/2017 15:16   Nm Pulmonary Perf And Vent  Result Date: 03/19/2017 CLINICAL DATA:  Dyspnea. EXAM: NUCLEAR MEDICINE VENTILATION - PERFUSION LUNG SCAN TECHNIQUE: Ventilation images were obtained in multiple projections using inhaled aerosol Tc-67m DTPA. Perfusion images were obtained in multiple projections after intravenous injection of Tc-79m MAA. RADIOPHARMACEUTICALS:  31.4 mCi Technetium-34m DTPA aerosol inhalation and 3.9 mCi  Technetium-92m MAA IV COMPARISON:  None. FINDINGS: Ventilation: No focal ventilation defect. Perfusion: No wedge shaped peripheral perfusion defects to suggest acute pulmonary embolism. IMPRESSION: Normal ventilation-perfusion lung scan. No evidence suggestive of pulmonary embolism. Electronically Signed   By: Lorriane Shire M.D.   On: 03/19/2017 11:37   Scheduled Meds: . allopurinol  200 mg Oral Daily  . carvedilol  6.25 mg Oral BID WC  . enoxaparin (LOVENOX) injection  40 mg Subcutaneous Q24H  . guaiFENesin  600 mg Oral BID  . nystatin  5 mL Mouth/Throat QID  . pantoprazole  40 mg Oral Daily  . rosuvastatin  10 mg Oral Daily  . sodium chloride flush  3 mL Intravenous Q12H   Continuous Infusions: . dextrose 5 % with KCl 20 mEq / L 20 mEq (03/19/17 1135)    LOS: 1 day   Kerney Elbe, DO [facility] Pager [phone]  If 7PM-7AM, please contact night-coverage [email or web address] Password [email or web address] 03/19/2017, 2:17 PM

## 2017-03-19 NOTE — Progress Notes (Signed)
Physical Therapy Treatment Patient Details Name: [name]. MRN: [id number] DOB: 1933/09/17 Today's Date: 03/19/2017    History of Present Illness 83 yo male admitted with CAD,SOB. Hx of fibromyalgia, lymphoma, CAD.     PT Comments    Progressing with mobility. Pt appears generally fatigued but he tolerated increased distance well. Discussed d/c plan again-pt politely declines HHPT f/u. He is agreeable to using his cane/walking stick for safe ambulation. Recommend daily ambulation with nursing supervision as able. Will continue to follow.     Follow Up Recommendations  No PT follow up;Supervision for mobility/OOB (pt declines HHPT follow up)     Equipment Recommendations  None recommended by PT    Recommendations for Other Services       Precautions / Restrictions Precautions Precautions: Fall Restrictions Weight Bearing Restrictions: No    Mobility  Bed Mobility Overal bed mobility: Modified Independent                Transfers Overall transfer level: Needs assistance   Transfers: Sit to/from Stand Sit to Stand: Supervision         General transfer comment: for safety  Ambulation/Gait Ambulation/Gait assistance: Min guard Ambulation Distance (Feet): 175 Feet Assistive device:  (IV pole/"furniture walking") Gait Pattern/deviations: Step-through pattern;Decreased stride length     General Gait Details: Pt prefers to "hug the rail/furniture walk". He also used the IV pole. He tolerated distance well.    Stairs            Wheelchair Mobility    Modified Rankin (Stroke Patients Only)       Balance                                            Cognition Arousal/Alertness: Awake/alert Behavior During Therapy: WFL for tasks assessed/performed Overall Cognitive Status: Within Functional Limits for tasks assessed                                 General Comments: seems a little down and tired       Exercises      General Comments        Pertinent Vitals/Pain Pain Assessment: No/denies pain    Home Living                      Prior Function            PT Goals (current goals can now be found in the care plan section) Progress towards PT goals: Progressing toward goals    Frequency    Min 3X/week      PT Plan Current plan remains appropriate    Co-evaluation              AM-PAC PT "6 Clicks" Daily Activity  Outcome Measure  Difficulty turning over in bed (including adjusting bedclothes, sheets and blankets)?: None Difficulty moving from lying on back to sitting on the side of the bed? : None Difficulty sitting down on and standing up from a chair with arms (e.g., wheelchair, bedside commode, etc,.)?: None Help needed moving to and from a bed to chair (including a wheelchair)?: A Little Help needed walking in hospital room?: A Little Help needed climbing 3-5 steps with a railing? : A Little 6 Click Score: 21    End  of Session   Activity Tolerance: Patient tolerated treatment well Patient left: in bed;with call bell/phone within reach   PT Visit Diagnosis: Muscle weakness (generalized) (M62.81);Difficulty in walking, not elsewhere classified (R26.2)     Time: 2353-6144 PT Time Calculation (min) (ACUTE ONLY): 9 min  Charges:  $Gait Training: 8-22 mins                    G Codes:          Weston Anna, MPT Pager: [phone]

## 2017-03-19 NOTE — Progress Notes (Signed)
[name]   HEMATOLOGY/ONCOLOGY INPATIENT PROGRESS NOTE  Date of Service: 03/19/2017  Inpatient Attending: .Dr Florencia Reasons MD  SUBJECTIVE  [name] knows that he is feeling better today with some improvement in his oral intake. Note that his have played dentures ordered home and he is missing these and has difficulties chewing without them. He is mouth soreness is getting better. He was encouraged to improve his oral fluid and food intake. Getting D5W with potassium with improvement in his renal function since yesterday. Still appear somewhat dehydrated. No significant diarrhea. Nausea has resolved. Encouraged patient to ambulate with help and try to eat and drink as well as he can. Wife at bedside.  OBJECTIVE:  NAD  PHYSICAL EXAMINATION: . Vitals:   03/18/17 1807 03/18/17 2201 03/19/17 0457 03/19/17 1330  BP: 139/83 (!) 126/57 (!) 147/73 131/78  Pulse: 62 69 61 66  Resp: 16 18 18 17   Temp: 98.1 F (36.7 C) 98.4 F (36.9 C) 97.9 F (36.6 C) 98 F (36.7 C)  TempSrc: Oral Oral Oral Oral  SpO2: 96% 98% 100% 100%  Weight:      Height:       Filed Weights   03/17/17 1534 03/17/17 2108  Weight: 178 lb (80.7 kg) 165 lb 9.1 oz (75.1 kg)   .Body mass index is 23.09 kg/m.  GENERAL:alert, in no acute distress and Appears somewhat emotionally flat SKIN: No acute rashes  EYES: normal, conjunctiva are pink and non-injected, sclera clear OROPHARYNX: Mild mucositis and a few pharyngeal exudates suggestive of thrush - improving. NECK: supple, no JVD, LYMPH:  no palpable lymphadenopathy in the cervical, axillary or inguinal LUNGS: clear to auscultation with normal respiratory effort HEART: regular rate & rhythm,  no murmurs and 1+ pitting lower extremity edema ABDOMEN: abdomen soft, non-tender, normoactive bowel sounds  Musculoskeletal: no cyanosis of digits and no clubbing  PSYCH: alert & oriented x 3 with fluent speech NEURO: no focal motor/sensory deficits  MEDICAL HISTORY:  Past  Medical History:  Diagnosis Date  . Coronary artery disease    a. stent (promus) Cx/OM'09  . Fibromyalgia dx'd ~ 04/2015  . GERD (gastroesophageal reflux disease)   . Gout   . HEARING LOSS    "temporary"  . Hyperlipidemia   . HYPERLIPIDEMIA 10/13/2007  . Hypertension   . HYPERTENSION 10/13/2007  . Osteoarthritis    "left shoulder" (08/23/2015)  . Prostatitis, acute   . PROSTATITIS, ACUTE, HX OF 10/13/2007  . PSORIASIS 10/13/2007  . PVC (premature ventricular contraction)    hx  . SKIN CANCER, RECURRENT 10/13/2007    SURGICAL HISTORY: Past Surgical History:  Procedure Laterality Date  . APPENDECTOMY    . CORONARY ANGIOPLASTY WITH STENT PLACEMENT    . CYST EXCISION Right X 2   shoulder  . ELECTROLYSIS OF MISDIRECTED LASHES Bilateral    eyelashes are misdirected and grow inwards   . EYE SURGERY    . MASS EXCISION Right 01/2005   proximal thigh soft tissue mass  . TEAR DUCT PROBING Left   . TYMPANOPLASTY Bilateral    "for hearing loss; put tubes in also, 2X on right, 1X on the left; tubes worked"  . VASECTOMY      SOCIAL HISTORY: Social History   Social History  . Marital status: Married    Spouse name: N/A  . Number of children: 3  . Years of education: N/A   Occupational History  . retired Retired    from [employer].   Social History [name] Topics  .  Smoking status: Never Smoker  . Smokeless tobacco: Never Used  . Alcohol use No  . Drug use: No  . Sexual activity: No   Other Topics Concern  . Not on file   Social History Narrative   Married 1958   2 daughters- '59, 68, 1 son '61   8 grandchildren   Retired from [employer], works PT as a Curator. Now retired completely (09/2008)   End of life; does not want heroic measures if in a persistent vegative state    FAMILY HISTORY: Family History  Problem Relation Age of Onset  . Heart attack Father 19       died  . Heart disease Father   . Parkinsonism Mother 64       died  . Breast cancer Sister        died    . Lung cancer Unknown        uncle died  . Colon cancer Neg Hx     ALLERGIES:  is allergic to niacin.  MEDICATIONS:  Scheduled Meds: . allopurinol  200 mg Oral Daily  . carvedilol  6.25 mg Oral BID WC  . enoxaparin (LOVENOX) injection  40 mg Subcutaneous Q24H  . guaiFENesin  600 mg Oral BID  . nystatin  5 mL Mouth/Throat QID  . pantoprazole  40 mg Oral Daily  . rosuvastatin  10 mg Oral Daily  . sodium chloride flush  3 mL Intravenous Q12H   Continuous Infusions: . dextrose 5 % with KCl 20 mEq / L 20 mEq (03/19/17 1135)   PRN Meds:.acetaminophen **OR** acetaminophen, albuterol, HYDROcodone-acetaminophen, ondansetron **OR** ondansetron (ZOFRAN) IV, prochlorperazine  REVIEW OF SYSTEMS:    10 Point review of Systems was done is negative except as noted above.   LABORATORY DATA:  I have reviewed the data as listed  . CBC Latest Ref Rng & Units 03/19/2017 03/18/2017 03/17/2017  WBC 4.0 - 10.5 K/uL 7.5 9.7 11.2(H)  Hemoglobin 13.0 - 17.0 g/dL 12.5(L) 12.6(L) 13.2  Hematocrit 39.0 - 52.0 % 40.5 39.9 41.2  Platelets 150 - 400 K/uL 124(L) 124(L) 126(L)    .[name] CMP Latest Ref Rng & Units 03/19/2017 03/18/2017 03/18/2017  Glucose 65 - 99 mg/dL 112(H) 108(H) 109(H)  BUN 6 - 20 mg/dL 11 14 15   Creatinine 0.61 - 1.24 mg/dL 1.57(H) 1.77(H) 1.88(H)  Sodium 135 - 145 mmol/L 151(H) 149(H) 148(H)  Potassium 3.5 - 5.1 mmol/L 3.3(L) 3.2(L) 3.3(L)  Chloride 101 - 111 mmol/L 117(H) 113(H) 112(H)  CO2 22 - 32 mmol/L 28 28 28   Calcium 8.9 - 10.3 mg/dL 8.2(L) 7.9(L) 7.8(L)  Total Protein 6.5 - 8.1 g/dL - - -  Total Bilirubin 0.3 - 1.2 mg/dL - - -  Alkaline Phos 38 - 126 U/L - - -  AST 15 - 41 U/L - - -  ALT 17 - 63 U/L - - -    Component     Latest Ref Rng & Units 03/18/2017 03/19/2017  Troponin I     <0.03 ng/mL <0.03   PREALBUMIN     18 - 38 mg/dL 12.2 (L)   Magnesium     1.7 - 2.4 mg/dL 1.6 (L) 2.0  Phosphorus     2.5 - 4.6 mg/dL 3.4   TSH     0.350 - 4.500 uIU/mL 2.192   Uric  Acid, Serum     4.4 - 7.6 mg/dL  5.0    RADIOGRAPHIC STUDIES: I have personally reviewed the radiological images as listed and agreed  with the findings in the report. Dg Chest 2 View  Result Date: 03/17/2017 CLINICAL DATA:  5 minute episode of shortness of breath while walking today. History of non-Hodgkin's lymphoma. EXAM: CHEST  2 VIEW COMPARISON:  Chest radiograph September 27, 2016 FINDINGS: Cardiomediastinal silhouette is normal. No pleural effusions or focal consolidations. Unchanged strandy densities LEFT lung base. Trachea projects midline and there is no pneumothorax. Soft tissue planes and included osseous structures are non-suspicious. IMPRESSION: Stable LEFT lung base atelectasis/ scarring. Electronically Signed   By: Elon Alas M.D.   On: 03/17/2017 16:02   US Renal  Result Date: 03/18/2017 CLINICAL DATA:  Elevated creatinine EXAM: RENAL / URINARY TRACT ULTRASOUND COMPLETE COMPARISON:  PET-CT 01/30/2017 FINDINGS: Right Kidney: Length: 11.2 cm. Echogenicity within normal limits. No mass or hydronephrosis visualized. Left Kidney: Length: 13 cm. Echogenicity within normal limits. No mass or hydronephrosis visualized. Bladder: Appears normal for degree of bladder distention. IMPRESSION: Negative renal ultrasound Electronically Signed   By: Donavan Foil M.D.   On: 03/18/2017 15:16   Nm Pulmonary Perf And Vent  Result Date: 03/19/2017 CLINICAL DATA:  Dyspnea. EXAM: NUCLEAR MEDICINE VENTILATION - PERFUSION LUNG SCAN TECHNIQUE: Ventilation images were obtained in multiple projections using inhaled aerosol Tc-31m DTPA. Perfusion images were obtained in multiple projections after intravenous injection of Tc-22m MAA. RADIOPHARMACEUTICALS:  31.4 mCi Technetium-14m DTPA aerosol inhalation and 3.9 mCi Technetium-96m MAA IV COMPARISON:  None. FINDINGS: Ventilation: No focal ventilation defect. Perfusion: No wedge shaped peripheral perfusion defects to suggest acute pulmonary embolism. IMPRESSION:  Normal ventilation-perfusion lung scan. No evidence suggestive of pulmonary embolism. Electronically Signed   By: Lorriane Shire M.D.   On: 03/19/2017 11:37    ASSESSMENT & PLAN:   83 year old male with  #1 Newly diagnosed stage IVBE Mantle cell lymphoma S/p 1 cycle of Bendamustine + Rituxan. He has hypermetabolic lymphadenopathy and extensive bowel involvement. Has significant constitutional symptoms including debilitating fatigue , weight loss of about 40 pounds since November 2017, anorexia, some subjective chills.  #2 s/p bothersome diarrhea and some rectal bleeding. Hemoglobin is stable at this time. This appears to be likely related to his mantle cell lymphoma. Could also have an underlying intermittent disorder. Is on Canasa and hydrocortisone suppositories as per GI. Patient's diarrhea has pretty much resolved at this time. Only minimal hemorrhoidal bleeding at this time.  #3 moderate to severe protein calorie malnutrition with significant weight loss- due to lymphoma and diarrhea. Slowly improving nutritional status with still hypoalbuminemic. Currently patient admitted with dehydration with poor oral intake. Still hypernatremic and has significant free water deficit.  #4 AKI - no overt evidence of tumor lysis syndrome. This appears to be primarily related to dehydration from poor oral intake with associated hypernatremia suggesting a total body water deficit though he has some pedal edema. Renal ultrasound showed no overt evidence of hydronephrosis or obstructive uropathy.  #5 short of breath- echo shows unchanged chronic systolic CHF ejection fraction 40-45% with some grade 1 diastolic dysfunction. Troponins do not suggest acute coronary syndrome. No SOB at this time.  #6 poor oral intake and some mild mouth soreness. Appears to have some mild mucositis and oral thrush. - resolving. Patient ask to use his partial dentures to be able to eat better. Plan -Patient is off Lasix  and potassium given his dehydration. -He is receiving IV fluids D5W with potassium - gradually improving renal insufficiency. Hypernatremia suggests that he still has significant free water deficit. -dietary consultation to optimize his oral intake. -Will need  PT OT evaluation to determine home needs on discharge. -Nystatin 4 times a day swish and swallow for his thrush. -Can use salt and baking soda mouthwash for swish/gargle and spit for mucositis. -Would hydrate the patient and keep him in the hospital until there is resolution of his hypernatremia and renal insufficiency. -Continue use of compression socks for his leg swelling. -Referral to outpatient cancer rehabilitation. -Discussed issues surrounding his emotional state and depression. Patient appears somewhat anxious with the diagnosis but declines any medications at this time. -Plan of care discussed with his wife at bedside  -His second cycle of Bendamustine-Rituxan treatment is due as per schedule on 6/6 if his labs are stable.  -I shall continue following him while in the hospital.  -RTC with Dr Irene Limbo with labs C2D1on 04/02/2017 as per his appointment   I spent 25 minutes counseling the patient face to face. The total time spent in the appointment was 35 minutes and more than 50% was on counseling and direct patient cares and co-ordination of cares.    Sullivan Lone MD [place] AAHIVMS [facility] Hematology/Oncology Physician [facility]  (Office):       [phone] (Work cell):  [phone] (Fax):           [phone]

## 2017-03-19 NOTE — Progress Notes (Signed)
OT Cancellation Note  Patient Details Name: [name]. MRN: [id number] DOB: 20-Jun-1933   Cancelled Treatment:    Reason Eval/Treat Not Completed: Patient at procedure or test/ unavailable  Pt not in room - will check back on pt as schedule allows  Kari Baars, [place] Payton Mccallum D 03/19/2017, 2:18 PM

## 2017-03-19 NOTE — Progress Notes (Signed)
PT Cancellation Note  Patient Details Name: [name]. MRN: [id number] DOB: 26-Dec-1932   Cancelled Treatment:    Reason Eval/Treat Not Completed: Attempted PT tx session-pt declined participation-made RN aware.    Weston Anna, MPT Pager: [phone]

## 2017-03-20 LAB — COMPREHENSIVE METABOLIC PANEL
ALBUMIN: 2.7 g/dL — AB (ref 3.5–5.0)
ALK PHOS: 106 U/L (ref 38–126)
ALT: 12 U/L — AB (ref 17–63)
ANION GAP: 7 (ref 5–15)
AST: 16 U/L (ref 15–41)
BUN: 12 mg/dL (ref 6–20)
CALCIUM: 8.3 mg/dL — AB (ref 8.9–10.3)
CHLORIDE: 112 mmol/L — AB (ref 101–111)
CO2: 28 mmol/L (ref 22–32)
CREATININE: 1.38 mg/dL — AB (ref 0.61–1.24)
GFR calc Af Amer: 53 mL/min — ABNORMAL LOW (ref >60)
GFR calc non Af Amer: 46 mL/min — ABNORMAL LOW (ref >60)
GLUCOSE: 93 mg/dL (ref 65–99)
Potassium: 3.8 mmol/L (ref 3.5–5.1)
SODIUM: 147 mmol/L — AB (ref 135–145)
Total Bilirubin: 0.6 mg/dL (ref 0.3–1.2)
Total Protein: 5.5 g/dL — ABNORMAL LOW (ref 6.5–8.1)

## 2017-03-20 LAB — CBC WITH DIFFERENTIAL/PLATELET
Basophils Absolute: 0 10*3/uL (ref 0.0–0.1)
Basophils Relative: 1 %
EOS ABS: 0.2 10*3/uL (ref 0.0–0.7)
EOS PCT: 3 %
HCT: 41.4 % (ref 39.0–52.0)
Hemoglobin: 13.1 g/dL (ref 13.0–17.0)
LYMPHS ABS: 1 10*3/uL (ref 0.7–4.0)
LYMPHS PCT: 13 %
MCH: 30.5 pg (ref 26.0–34.0)
MCHC: 31.6 g/dL (ref 30.0–36.0)
MCV: 96.5 fL (ref 78.0–100.0)
MONO ABS: 0.6 10*3/uL (ref 0.1–1.0)
MONOS PCT: 8 %
Neutro Abs: 5.7 10*3/uL (ref 1.7–7.7)
Neutrophils Relative %: 75 %
PLATELETS: 127 10*3/uL — AB (ref 150–400)
RBC: 4.29 MIL/uL (ref 4.22–5.81)
RDW: 18.1 % — ABNORMAL HIGH (ref 11.5–15.5)
WBC: 7.6 10*3/uL (ref 4.0–10.5)

## 2017-03-20 LAB — PHOSPHORUS: PHOSPHORUS: 2.8 mg/dL (ref 2.5–4.6)

## 2017-03-20 LAB — MAGNESIUM: Magnesium: 1.8 mg/dL (ref 1.7–2.4)

## 2017-03-20 MED ORDER — POTASSIUM CL IN DEXTROSE 5% 20 MEQ/L IV SOLN
20.0000 meq | INTRAVENOUS | Status: AC
  Administered 2017-03-20: 20 meq via INTRAVENOUS
  Filled 2017-03-20: qty 1000

## 2017-03-20 NOTE — Evaluation (Signed)
Occupational Therapy Evaluation Patient Details Name: [name]. MRN: [id number] DOB: Nov 26, 1932 Today's Date: 03/20/2017    History of Present Illness 83 yo male admitted with CAD,SOB. Hx of fibromyalgia, lymphoma, CAD.    Clinical Impression   Pt feels he is back to baseline with simple ADL activity. Education complete regarding safety with ADL activity     Follow Up Recommendations  No OT follow up    Equipment Recommendations  None recommended by OT    Recommendations for Other Services       Precautions / Restrictions Restrictions Weight Bearing Restrictions: No      Mobility Bed Mobility Overal bed mobility: Modified Independent                Transfers Overall transfer level: Needs assistance   Transfers: Sit to/from Stand Sit to Stand: Supervision         General transfer comment: for safety        ADL either performed or assessed with clinical judgement   ADL Overall ADL's : At baseline                                                          Pertinent Vitals/Pain Pain Assessment: No/denies pain     Hand Dominance     Extremity/Trunk Assessment Upper Extremity Assessment Upper Extremity Assessment: Overall WFL for tasks assessed           Communication Communication Communication: No difficulties   Cognition Arousal/Alertness: Awake/alert Behavior During Therapy: WFL for tasks assessed/performed Overall Cognitive Status: Within Functional Limits for tasks assessed                                                Home Living Family/patient expects to be discharged to:: Private residence Living Arrangements: Spouse/significant other Available Help at Discharge: Family Type of Home: House Home Access: Stairs to enter Technical brewer of Steps: 2 Entrance Stairs-Rails: None Home Layout: One level     Bathroom Shower/Tub: Occupational psychologist:  Standard     Home Equipment: Environmental consultant - 2 wheels;Cane - single point          Prior Functioning/Environment Level of Independence: Independent                          OT Goals(Current goals can be found in the care plan section) Acute Rehab OT Goals Patient Stated Goal: home OT Goal Formulation: With patient  OT Frequency:                AM-PAC PT "6 Clicks" Daily Activity     Outcome Measure Help from another person eating meals?: None Help from another person taking care of personal grooming?: None Help from another person toileting, which includes using toliet, bedpan, or urinal?: None Help from another person bathing (including washing, rinsing, drying)?: None Help from another person to put on and taking off regular upper body clothing?: None Help from another person to put on and taking off regular lower body clothing?: None 6 Click Score: 24   End of Session Nurse Communication: Mobility status  Activity Tolerance: Patient  tolerated treatment well Patient left: in chair;with call bell/phone within reach  OT Visit Diagnosis: Unsteadiness on feet (R26.81)                Time: 0340-3524 OT Time Calculation (min): 13 min Charges:  OT General Charges $OT Visit: 1 Procedure OT Evaluation $OT Eval Moderate Complexity: 1 Procedure G-Codes:     Kari Baars, OT [phone]  Payton Mccallum D 03/20/2017, 11:03 AM

## 2017-03-20 NOTE — Discharge Summary (Addendum)
Physician Discharge Summary  [name]. IRS:[id number] DOB: 1933-10-27 DOA: 03/17/2017  PCP: Eulas Post, MD  Admit date: 03/17/2017 Discharge date: 03/20/2017  Admitted From: Home Disposition: Home with [place]; Refused PT and OT  Recommendations for Outpatient Follow-up:  1. Follow up with PCP in 1-2 weeks 2. Follow up with Oncology Dr. Irene Limbo for next scheduled Chemotherapy Treatment on 04/02/17 3. Follow up with Cardiology as an outpatient for Cardiac Monitor to r/o Arrhythmia  4. Please obtain CMP/CBC, Mag, Phos in one week 5. Please follow up on the following pending results:  Home Health: YES Equipment/Devices: None recommended by PT  Discharge Condition: Stable CODE STATUS: FULL CODE Diet recommendation: Heart Healthy   Brief/Interim Summary: Jacob Sandovalis a 83 y.o.malewith medical history significant of Mantle cell lymphoma, hypertension, dyslipidemia, psoriasis, fibromyalgia, coronary artery disease status post PCI in 2009, GERD and other comorbids who presented to [facility] with a cc of SOB. Was recently started on Lasix and had decreased po intake due to generalized fatigue. Was admitted to [facility] for Dehydration, Hypernatremia, and Dyspnea. He was found to be Hypernatremic and had an AKI which is now improving. Oncology Dr. Irene Limbo was consulted for further evaluation and recommendations. Patient had a sudden onset of dyspnea after exertion ambulating but improved. He was evaluated for PE and was ruled out. His Electrolytes were improved. At this time patient was deemed medically stable to D/C home and will need to follow up with PCP, Oncology, and Cardiology as an outpatient. We will hold his Lisinopril until he is evaluated by PCP and AKI fully resolves.    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 SVT vs sinus tachycardia,he is also very deconditioned was dehydrated.  -Echo EF 40-45% , hypokiness of the inferolateal myocardium, grade I diastolic dysfunction. Right ventricle: The cavity size was mildly dilated. Wall thickness was normal. Systolic function was normal. Pulmonary artery: The main pulmonary artery was normal-sized. Systolic pressure was within the normal range. -He does has active cancer, there was concern about PE, he can not get CTA, echo as above, venous doppler showed no DVT,  -VQ scan showed Normal Ventilation-Perfusion Lung Scan and no evidence to suggest PE, -He is not a good candidate for anticoagulation due to h/o gi bleed and frailty -Was on Albuterol Nebs 2.5 mg q2hprn and with Guaifenesin 600 mg po BID while hospitalized -Outpatient cardiac monitoring for rule out arrhythmia -Changed Atenolol to Coreg 6.25 mg po BID due to renal impairment and h/o CHF -Follow up with PCP and Cardiology as an outpatient  Hypokalemia/Hypomagnesemia  -Patient's Potassium Level  Improved to 3.8 -Changed IVF to D5W + 20 mEQ at 75 mL/hr to 100 mL/hr -R-Mag Level was 1.8; Was given IV Mag Sulfate on 5/22 -Repeat CMP and Mag Level as an outpatient  Hypernatremia/Hyperchloremia; worsening -Na+went from 149 -> 151 -> 147 and Chloride went from 113 -> 117 -> 112 -Likely  due to decreased p.o. intake and dehydration -He was started onD5 half normal saline since admission and now changed to Strictly D5W + 20 mEQ of KCl and will D/C once Discharged.  -Repeat CMP as an outpatient  AKI  (acute kidney injury), improving -Cr started to increase from 5/17 -BUN/Cr went from 14/1.77 -> 11/1.57 -> 12/1.38 -Likely secondary to dehydration,  -Urinalysis showed no infection, no blood, no protein -Renal ultrasound unremarkable -Hold Nephrotoxic Agents including Lasix and Lisinopril until evaluated by PCP  Dehydration/Orthostasis -Held Lasix and Continued with IVF while hospitalized  Essential Hypertensionwith orthostatic hypotension -Held Lisinoprol,  -Continued IVF while Hospitalized  -Changed Atenolol to Coreg 6.25 mg po BID.  -Follow up with PCP at Discharge  Hyperlipidemia  -Continue home medication with Rosuvastatin 10 mg po Daily  CAD -He presented with sudden onset of dyspnea, no chest pain, no hypoxia, continue on tele.  -Continue Carvedilol 6.25 mg po BID -Patient had Negative Cardiac Enzymes. No acute EKG changes  H/o combined Systolic and Diastolic CHF:  -Presented with dehydration and appears to be still a little dehydrated -ECHO result as above,  -Atenolol changed to coreg, lisinopril discontinued due to renal impairment, may be able to resume in the near future on outpatient basis if renal function normalized.  -Continue to Monitor Volemic Status and follow up with PCP and Cardiology as an outpatient  Thrush? 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    Severe malnutrition in context of acute illness/injury -Nutrition input appreciated: "Per chart review, pt has lost 20 lb since 4/9 (11% wt loss x 1.5 months, significant for time frame). Nutrition-Focused physical exam completed. Findings are moderatefat depletion, moderatemuscle depletion, and noedema. " -Nutrition recommending Magic Cup TID with Meals as each supplement provides 290 kcal and 9 grams of protein -Continue to Monitor and c/w Feeding Supplements  Deconditioning and Generalized Weakness -Obtained PT Evaluation and recommended no PT Follow up as patient refused Home Health PT this AM -May need Outpatient PT Evaluation  Discharge Instructions  Discharge Instructions    Call MD for:  difficulty breathing, headache or visual disturbances    Complete by:  As directed    Call MD for:  extreme fatigue    Complete by:  As directed    Call MD for:  persistant dizziness or light-headedness    Complete by:  As directed    Call MD for:  persistant nausea and vomiting    Complete by:  As directed    Call MD for:  severe uncontrolled pain    Complete by:  As directed    Call MD for:  temperature >100.4    Complete by:  As directed    Diet - low sodium heart healthy    Complete by:  As directed    Discharge instructions    Complete by:  As directed    Follow up with PCP and Oncology Dr. Irene Limbo as  an outpatient. Take all medications as prescribed. If symptoms change or worsen please return to the ED for evaluation.   Face-to-face encounter (required for Medicare/Medicaid patients)    Complete by:  As directed    I Raiford Noble certify that this patient is under my care and that I, or a nurse practitioner or physician's assistant working with me, had a face-to-face encounter that meets the physician face-to-face encounter requirements with this patient on 03/18/2017. The encounter with the patient was in whole, or in part for the following medical condition(s) which is the primary  reason for home health care (List medical condition): FTT   The encounter with the patient was in whole, or in part, for the following medical condition, which is the primary reason for home health care:  FTT   I certify that, based on my findings, the following services are medically necessary home health services:   Nursing Physical therapy     Reason for Medically Necessary Home Health Services:  Skilled Nursing- Change/Decline in Patient Status   My clinical findings support the need for the above services:  Shortness of breath with activity   Further, I certify that my clinical findings support that this patient is homebound due to:  Shortness of Breath with activity   Home Health    Complete by:  As directed    To provide the following care/treatments:   PT [name] work     Increase activity slowly    Complete by:  As directed      Allergies as of 03/20/2017      Reactions   Niacin Hives      Medication List    STOP taking these medications   atenolol 25 MG tablet Commonly known as:  TENORMIN   lisinopril 2.5 MG tablet Commonly known as:  PRINIVIL,ZESTRIL     TAKE these medications   allopurinol 100 MG tablet Commonly known as:  ZYLOPRIM Take 2 tablets (200 mg total) by mouth daily. What changed:  how much to take   carvedilol 6.25 MG tablet Commonly known as:  COREG Take 1 tablet (6.25 mg total) by mouth 2 (two) times daily.   dexamethasone 4 MG tablet Commonly known as:  DECADRON Take 2 tablets (8 mg total) by mouth daily. Start the day after bendamustine chemotherapy for 2 days. Take with food.   fluticasone 50 MCG/ACT nasal spray Commonly known as:  FLONASE Place 1-2 sprays into both nostrils daily as needed for rhinitis.   furosemide 20 MG tablet Commonly known as:  LASIX Take 1 tablet (20 mg total) by mouth daily as needed for edema. What changed:  when to take this  reasons to take this   hydrocortisone 2.5 % rectal  cream Commonly known as:  ANUSOL-HC Place rectally 3 (three) times daily. What changed:  how much to take   hydrocortisone 25 MG suppository Commonly known as:  ANUSOL-HC Place 1 suppository (25 mg total) rectally 2 (two) times daily.   magic mouthwash Soln Take 5 mLs by mouth 3 (three) times daily.   mesalamine 1000 MG suppository Commonly known as:  CANASA Place 1 suppository (1,000 mg total) rectally at bedtime.   nitroGLYCERIN 0.4 MG SL tablet Commonly known as:  NITROSTAT DISSOLVE ONE TABLET UNDER THE TONGUE EVERY 5 MINUTES AS NEEDED What changed:  how much to take  how to take this  when to take this  reasons to take this  additional instructions   ondansetron  8 MG tablet Commonly known as:  ZOFRAN Take 1 tablet (8 mg total) by mouth 2 (two) times daily as needed for refractory nausea / vomiting. Start on day 2 after bendamustine chemo.   pantoprazole 40 MG tablet Commonly known as:  PROTONIX Take 1 tablet (40 mg total) by mouth daily.   potassium chloride SA 20 MEQ tablet Commonly known as:  K-DUR,KLOR-CON Take 1 tablet (20 mEq total) by mouth daily.   prochlorperazine 10 MG tablet Commonly known as:  COMPAZINE Take 1 tablet (10 mg total) by mouth every 6 (six) hours as needed (Nausea or vomiting).   rosuvastatin 10 MG tablet Commonly known as:  CRESTOR Take one tablet by mouth daily prior to bedtime What changed:  how much to take  how to take this  when to take this  additional instructions      Follow-up Information    Eulas Post, MD Follow up in 1 week(s).   Specialty:  Family Medicine Why:  hospital discharge follow up, repeat cbc/bmp at follow up. Contact information: [place] [address] [phone]        [place] [facility] Follow up.   Specialty:  Cardiology Why:  please call cardiology's office if you do not hear from them in three business days,  need outpatient cardiac  monitoring for a month. Contact information: [address] [facility] Follow up.   Why:  [place], aide Contact information: [place] [address] [phone]          Allergies  Allergen Reactions  . Niacin Hives   Consultations:  Oncology Dr. Irene Limbo  Procedures/Studies: Dg Chest 2 View  Result Date: 03/19/2017 CLINICAL DATA:  Shortness of breath EXAM: CHEST  2 VIEW COMPARISON:  03/17/2017 FINDINGS: Normal heart size and mediastinal contours. No acute infiltrate or edema. Minimal atelectasis or scarring is stable. No effusion or pneumothorax. No acute osseous findings. IMPRESSION: No evidence of active disease.  Stable compared to prior. Electronically Signed   By: Monte Fantasia M.D.   On: 03/19/2017 15:17   Dg Chest 2 View  Result Date: 03/17/2017 CLINICAL DATA:  5 minute episode of shortness of breath while walking today. History of non-Hodgkin's lymphoma. EXAM: CHEST  2 VIEW COMPARISON:  Chest radiograph September 27, 2016 FINDINGS: Cardiomediastinal silhouette is normal. No pleural effusions or focal consolidations. Unchanged strandy densities LEFT lung base. Trachea projects midline and there is no pneumothorax. Soft tissue planes and included osseous structures are non-suspicious. IMPRESSION: Stable LEFT lung base atelectasis/ scarring. Electronically Signed   By: Elon Alas M.D.   On: 03/17/2017 16:02   US Renal  Result Date: 03/18/2017 CLINICAL DATA:  Elevated creatinine EXAM: RENAL / URINARY TRACT ULTRASOUND COMPLETE COMPARISON:  PET-CT 01/30/2017 FINDINGS: Right Kidney: Length: 11.2 cm. Echogenicity within normal limits. No mass or hydronephrosis visualized. Left Kidney: Length: 13 cm. Echogenicity within normal limits. No mass or hydronephrosis visualized. Bladder: Appears normal for degree of bladder distention. IMPRESSION: Negative renal ultrasound  Electronically Signed   By: Donavan Foil M.D.   On: 03/18/2017 15:16   Nm Pulmonary Perf And Vent  Result Date: 03/19/2017 CLINICAL DATA:  Dyspnea. EXAM: NUCLEAR MEDICINE VENTILATION - PERFUSION LUNG SCAN TECHNIQUE: Ventilation images were obtained in multiple projections using inhaled aerosol Tc-53m DTPA. Perfusion images were obtained in multiple projections after intravenous injection of Tc-59m MAA. RADIOPHARMACEUTICALS:  31.4 mCi Technetium-52m DTPA aerosol inhalation and 3.9 mCi Technetium-13m MAA IV COMPARISON:  None. FINDINGS: Ventilation: No focal ventilation defect. Perfusion: No wedge shaped peripheral perfusion defects to suggest acute pulmonary embolism. IMPRESSION: Normal ventilation-perfusion lung scan. No evidence suggestive of pulmonary embolism. Electronically Signed   By: Lorriane Shire M.D.   On: 03/19/2017 11:37    ECHOCARDIOGRAM  Study Conclusions  - Left ventricle: The cavity size was normal. Systolic function was   mildly to moderately reduced. The estimated ejection fraction was   in the range of 40% to 45%. Hypokinesis of the inferolateral   myocardium. Doppler parameters are consistent with abnormal left   ventricular relaxation (grade 1 diastolic dysfunction). - Aortic valve: There was trivial regurgitation. - Right ventricle: The cavity size was mildly dilated. Wall   thickness was normal.  Subjective: Seen and examined and was doing well and wanted to go home. Had one episode of emesis this AM but stated it was because he mixed his juices. No nausea or vomiting. No CP and SOB has resolved.   Discharge Exam: Vitals:   03/19/17 2238 03/20/17 0641  BP: 130/64 140/71  Pulse: 65 (!) 56  Resp: 18 16  Temp: 98.3 F (36.8 C) 97.4 F (36.3 C)   Vitals:   03/19/17 0457 03/19/17 1330 03/19/17 2238 03/20/17 0641  BP: (!) 147/73 131/78 130/64 140/71  Pulse: 61 66 65 (!) 56  Resp: 18 17 18 16   Temp: 97.9 F (36.6 C) 98 F (36.7 C) 98.3 F (36.8 C) 97.4 F (36.3  C)  TempSrc: Oral Oral Oral Oral  SpO2: 100% 100% 98% 100%  Weight:      Height:       General: Pt is and elderly frail 83 yo Caucasian male who alert, awake, not in acute distress Cardiovascular: Bradycardic Rate, S1/S2 +, no rubs, no gallops Respiratory: CTA bilaterally, no wheezing, no rhonchi Abdominal: Soft, NT, ND, bowel sounds + Extremities: no edema, no cyanosis  The results of significant diagnostics from this hospitalization (including imaging, microbiology, ancillary and laboratory) are listed below for reference.    Microbiology: No results found for this or any previous visit (from the past 240 hour(s)).   Labs: BNP (last 3 results)  Recent Labs  03/17/17 1605  BNP 194.1*   Basic Metabolic Panel:  Recent Labs Lab 03/18/17 0421 03/18/17 0958 03/18/17 1548 03/19/17 0551 03/20/17 1007  NA 149*  150* 148* 149* 151* 147*  K 3.4*  3.4* 3.3* 3.2* 3.3* 3.8  CL 112*  113* 112* 113* 117* 112*  CO2 28  29 28 28 28 28   GLUCOSE 124*  126* 109* 108* 112* 93  BUN 15  15 15 14 11 12   CREATININE 1.90*  1.93* 1.88* 1.77* 1.57* 1.38*  CALCIUM 7.9*  7.9* 7.8* 7.9* 8.2* 8.3*  MG 1.6*  --   --  2.0 1.8  PHOS 3.4  --   --   --  2.8   Liver Function Tests:  Recent Labs Lab 03/17/17 1605 03/18/17 0421 03/20/17 1007  AST 16 16 16   ALT 14* 13* 12*  ALKPHOS 111 105 106  BILITOT 0.7 0.8 0.6  PROT 5.6* 5.3* 5.5*  ALBUMIN 3.0* 2.9* 2.7*   No results for input(s): LIPASE, AMYLASE in the last 168 hours. No results for input(s): AMMONIA in the last 168 hours. CBC:  Recent Labs Lab 03/17/17 1605 03/18/17 0421 03/19/17 0551 03/20/17 1007  WBC 11.2* 9.7 7.5 7.6  NEUTROABS 9.2*  --  5.7 5.7  HGB 13.2 12.6* 12.5* 13.1  HCT 41.2 39.9 40.5 41.4  MCV 95.4 95.7 96.9 96.5  PLT 126* 124* 124* 127*   Cardiac Enzymes:  Recent Labs Lab 03/17/17 1902 03/18/17 0042 03/18/17 0645  TROPONINI <0.03 <0.03 0.03*   BNP: Invalid input(s): POCBNP CBG: No results  for input(s): GLUCAP in the last 168 hours. D-Dimer No results for input(s): DDIMER in the last 72 hours. Hgb A1c No results for input(s): HGBA1C in the last 72 hours. Lipid Profile No results for input(s): CHOL, HDL, LDLCALC, TRIG, CHOLHDL, LDLDIRECT in the last 72 hours. Thyroid function studies  Recent Labs  03/18/17 0421  TSH 2.192   Anemia work up No results for input(s): VITAMINB12, FOLATE, FERRITIN, TIBC, IRON, RETICCTPCT in the last 72 hours. Urinalysis    Component Value Date/Time   COLORURINE STRAW (A) 03/17/2017 2158   APPEARANCEUR CLEAR 03/17/2017 2158   LABSPEC 1.004 (L) 03/17/2017 2158   PHURINE 7.0 03/17/2017 2158   GLUCOSEU NEGATIVE 03/17/2017 2158   HGBUR NEGATIVE 03/17/2017 2158   BILIRUBINUR NEGATIVE 03/17/2017 2158   KETONESUR NEGATIVE 03/17/2017 2158   PROTEINUR NEGATIVE 03/17/2017 2158   UROBILINOGEN 1.0 08/23/2015 1302   NITRITE NEGATIVE 03/17/2017 2158   LEUKOCYTESUR NEGATIVE 03/17/2017 2158   Sepsis Labs Invalid input(s): PROCALCITONIN,  WBC,  LACTICIDVEN Microbiology No results found for this or any previous visit (from the past 240 hour(s)).  Time coordinating discharge: 35 minutes  SIGNED:  Kerney Elbe, DO [facility] 03/20/2017, 11:52 AM Pager [phone]  If 7PM-7AM, please contact night-coverage [email or web address] Password [email or web address]

## 2017-03-20 NOTE — Care Management Note (Signed)
Case Management Note  Patient Details  Name: [name]. MRN: [id number] Date of Birth: 19-Aug-1933  Subjective/Objective: d/c home today. [facility] rep Kim aware of [place] orders. PT-np PT f/u, patient declines HHPT.No further CM needs.                   Action/Plan:d/c home w/HHC.   Expected Discharge Date:   (unknown)               Expected Discharge Plan:  [place]  In-House Referral:     Discharge planning Services  CM Consult  Post Acute Care Choice:    Choice offered to:  Patient  DME Arranged:    DME Agency:     HH Arranged:  RN, Nurse's Aide [place] Agency:  [place]  Status of Service:  Completed, signed off  If discussed at [place] of Stay Meetings, dates discussed:    Additional Comments:  Dessa Phi, RN 03/20/2017, 11:17 AM

## 2017-03-21 ENCOUNTER — Telehealth: Payer: Self-pay | Admitting: Family Medicine

## 2017-03-21 ENCOUNTER — Telehealth: Payer: Self-pay

## 2017-03-21 NOTE — Telephone Encounter (Signed)
LMTCB

## 2017-03-21 NOTE — Telephone Encounter (Signed)
[name] is calling to let md know the patient was referred  to adv home care for nursing for [name]. Pt decline adv home care service

## 2017-03-25 NOTE — Telephone Encounter (Signed)
D/C 03/20/17 To: home  Spoke with pt and he states that he is still very weak but is improving slowly. He reports that he has lost about 50lbs over the past few months. He states that sometimes he "gags" on food when eating. He has tried Boost/Ensure but does not tolerate them well. He does start another round of chemo June 6th. Pt did drive a little bit today, for the first time in 3 months. He is aware to take it slow and easy and not over-exert himself.   Pt states that he was not able to get the Magic Mouthwash as prescribed by the hospital and tried several pharmacies but no one had in stock. He reports a "cotton" feeling and extreme dry mouth. He would like something called in to treat possible thrush.   Appt scheduled with Dorothyann Peng, NP as Dr. Elease Hashimoto is not in office during TCM timeframe. Pt aware. Appt 04/01/17.    Transition Care Management Follow-up Telephone Call  How have you been since you were released from the hospital? Still very weak, improving slowly   Do you understand why you were in the hospital? yes   Do you understand the discharge instrcutions? yes  Items Reviewed:  Medications reviewed: yes  Allergies reviewed: yes  Dietary changes reviewed: yes  Referrals reviewed: yes   Functional Questionnaire:   Activities of Daily Living (ADLs):   He states they are independent in the following: ambulation, bathing and hygiene, feeding, continence, grooming, toileting and dressing States they require assistance with the following: none   Any transportation issues/concerns?: no   Any patient concerns? no   Confirmed importance and date/time of follow-up visits scheduled: yes   Confirmed with patient if condition begins to worsen call PCP or go to the ER.  Patient was given the Call-a-Nurse line [phone]: yes

## 2017-03-26 ENCOUNTER — Other Ambulatory Visit: Payer: Self-pay | Admitting: *Deleted

## 2017-03-26 MED ORDER — MAGIC MOUTHWASH: 5.0000 mL | Freq: Three times a day (TID) | ORAL | 0 refills | Status: DC

## 2017-03-26 MED ORDER — NYSTATIN 100000 UNIT/ML MT SUSP: 5.0000 mL | Freq: Three times a day (TID) | OROMUCOSAL | 1 refills | Status: DC

## 2017-03-26 MED ORDER — PANTOPRAZOLE SODIUM 40 MG PO TBEC: 40.0000 mg | DELAYED_RELEASE_TABLET | Freq: Every day | ORAL | 0 refills | Status: DC

## 2017-03-26 NOTE — Telephone Encounter (Signed)
It is ok to send in Nystatin liquid. Swish and spit TID

## 2017-03-26 NOTE — Telephone Encounter (Signed)
Spoke with pt and advised of rx. He now wants to cancel his TCM appt as he has decided he only wants to see Dr. Elease Hashimoto and no one else. Advised pt that Dr. Elease Hashimoto will be out of the office for several weeks and that it is recommended he follow up with PCP within 2 weeks, however he is adamant he does not want to see anyone else. Appt canceled and pt aware to call office for anything further. Rx sent to pharmacy and explained to pt.

## 2017-04-01 ENCOUNTER — Ambulatory Visit: Payer: Medicare Other | Admitting: Adult Health

## 2017-04-02 ENCOUNTER — Other Ambulatory Visit (HOSPITAL_BASED_OUTPATIENT_CLINIC_OR_DEPARTMENT_OTHER): Payer: Medicare Other

## 2017-04-02 ENCOUNTER — Encounter: Payer: Self-pay | Admitting: Hematology

## 2017-04-02 ENCOUNTER — Ambulatory Visit (HOSPITAL_BASED_OUTPATIENT_CLINIC_OR_DEPARTMENT_OTHER): Payer: Medicare Other | Admitting: Hematology

## 2017-04-02 ENCOUNTER — Ambulatory Visit (HOSPITAL_BASED_OUTPATIENT_CLINIC_OR_DEPARTMENT_OTHER): Payer: Medicare Other

## 2017-04-02 VITALS — BP 105/71 | HR 64 | Temp 98.6°F | Resp 18 | Ht 71.0 in | Wt 173.6 lb

## 2017-04-02 VITALS — BP 118/71 | HR 64 | Temp 98.0°F | Resp 18

## 2017-04-02 DIAGNOSIS — R197 Diarrhea, unspecified: Secondary | ICD-10-CM | POA: Diagnosis not present

## 2017-04-02 DIAGNOSIS — Z5112 Encounter for antineoplastic immunotherapy: Secondary | ICD-10-CM | POA: Diagnosis not present

## 2017-04-02 DIAGNOSIS — C8318 Mantle cell lymphoma, lymph nodes of multiple sites: Secondary | ICD-10-CM

## 2017-04-02 DIAGNOSIS — Z5111 Encounter for antineoplastic chemotherapy: Secondary | ICD-10-CM

## 2017-04-02 DIAGNOSIS — R7989 Other specified abnormal findings of blood chemistry: Secondary | ICD-10-CM

## 2017-04-02 LAB — COMPREHENSIVE METABOLIC PANEL
ALT: 13 U/L (ref 0–55)
AST: 17 U/L (ref 5–34)
Albumin: 2.6 g/dL — ABNORMAL LOW (ref 3.5–5.0)
Alkaline Phosphatase: 91 U/L (ref 40–150)
Anion Gap: 5 mEq/L (ref 3–11)
BILIRUBIN TOTAL: 0.43 mg/dL (ref 0.20–1.20)
BUN: 9.2 mg/dL (ref 7.0–26.0)
CO2: 27 meq/L (ref 22–29)
CREATININE: 0.8 mg/dL (ref 0.7–1.3)
Calcium: 8.3 mg/dL — ABNORMAL LOW (ref 8.4–10.4)
Chloride: 108 mEq/L (ref 98–109)
EGFR: 82 mL/min/{1.73_m2} — ABNORMAL LOW (ref >90)
GLUCOSE: 83 mg/dL (ref 70–140)
Potassium: 4.4 mEq/L (ref 3.5–5.1)
SODIUM: 140 meq/L (ref 136–145)
TOTAL PROTEIN: 4.9 g/dL — AB (ref 6.4–8.3)

## 2017-04-02 LAB — CBC & DIFF AND RETIC
BASO%: 0.5 % (ref 0.0–2.0)
BASOS ABS: 0 10*3/uL (ref 0.0–0.1)
EOS ABS: 0.3 10*3/uL (ref 0.0–0.5)
EOS%: 7.5 % — ABNORMAL HIGH (ref 0.0–7.0)
HCT: 34.6 % — ABNORMAL LOW (ref 38.4–49.9)
HGB: 11 g/dL — ABNORMAL LOW (ref 13.0–17.1)
IMMATURE RETIC FRACT: 4.7 % (ref 3.00–10.60)
LYMPH%: 22.7 % (ref 14.0–49.0)
MCH: 30.4 pg (ref 27.2–33.4)
MCHC: 31.8 g/dL — ABNORMAL LOW (ref 32.0–36.0)
MCV: 95.6 fL (ref 79.3–98.0)
MONO#: 0.8 10*3/uL (ref 0.1–0.9)
MONO%: 20.9 % — ABNORMAL HIGH (ref 0.0–14.0)
NEUT%: 48.4 % (ref 39.0–75.0)
NEUTROS ABS: 1.9 10*3/uL (ref 1.5–6.5)
NRBC: 0 % (ref 0–0)
Platelets: 175 10*3/uL (ref 140–400)
RBC: 3.62 10*6/uL — AB (ref 4.20–5.82)
RDW: 17.3 % — AB (ref 11.0–14.6)
RETIC %: 1.12 % (ref 0.80–1.80)
RETIC CT ABS: 40.54 10*3/uL (ref 34.80–93.90)
WBC: 4 10*3/uL (ref 4.0–10.3)
lymph#: 0.9 10*3/uL (ref 0.9–3.3)

## 2017-04-02 LAB — URIC ACID: Uric Acid, Serum: 2.9 mg/dl (ref 2.6–7.4)

## 2017-04-02 LAB — LACTATE DEHYDROGENASE: LDH: 231 U/L (ref 125–245)

## 2017-04-02 MED ORDER — PALONOSETRON HCL INJECTION 0.25 MG/5ML
INTRAVENOUS | Status: AC
  Filled 2017-04-02: qty 5

## 2017-04-02 MED ORDER — DEXAMETHASONE SODIUM PHOSPHATE 10 MG/ML IJ SOLN
10.0000 mg | Freq: Once | INTRAMUSCULAR | Status: AC
  Administered 2017-04-02: 10 mg via INTRAVENOUS

## 2017-04-02 MED ORDER — ACETAMINOPHEN 325 MG PO TABS
650.0000 mg | ORAL_TABLET | Freq: Once | ORAL | Status: AC
  Administered 2017-04-02: 650 mg via ORAL

## 2017-04-02 MED ORDER — ACETAMINOPHEN 325 MG PO TABS
ORAL_TABLET | ORAL | Status: AC
  Filled 2017-04-02: qty 2

## 2017-04-02 MED ORDER — DIPHENHYDRAMINE HCL 25 MG PO CAPS
ORAL_CAPSULE | ORAL | Status: AC
  Filled 2017-04-02: qty 2

## 2017-04-02 MED ORDER — SODIUM CHLORIDE 0.9 % IV SOLN
70.0000 mg/m2 | Freq: Once | INTRAVENOUS | Status: AC
  Administered 2017-04-02: 150 mg via INTRAVENOUS
  Filled 2017-04-02: qty 6

## 2017-04-02 MED ORDER — SODIUM CHLORIDE 0.9 % IV SOLN
Freq: Once | INTRAVENOUS | Status: AC
  Administered 2017-04-02: 11:00:00 via INTRAVENOUS

## 2017-04-02 MED ORDER — DEXAMETHASONE SODIUM PHOSPHATE 10 MG/ML IJ SOLN
INTRAMUSCULAR | Status: AC
  Filled 2017-04-02: qty 1

## 2017-04-02 MED ORDER — DIPHENHYDRAMINE HCL 25 MG PO CAPS
50.0000 mg | ORAL_CAPSULE | Freq: Once | ORAL | Status: AC
  Administered 2017-04-02: 50 mg via ORAL

## 2017-04-02 MED ORDER — PALONOSETRON HCL INJECTION 0.25 MG/5ML
0.2500 mg | Freq: Once | INTRAVENOUS | Status: AC
  Administered 2017-04-02: 0.25 mg via INTRAVENOUS

## 2017-04-02 MED ORDER — SODIUM CHLORIDE 0.9 % IV SOLN
375.0000 mg/m2 | Freq: Once | INTRAVENOUS | Status: AC
  Administered 2017-04-02: 800 mg via INTRAVENOUS
  Filled 2017-04-02: qty 50

## 2017-04-02 NOTE — Patient Instructions (Signed)
Thank you for choosing [facility] to provide your oncology and hematology care.  To afford each patient quality time with our providers, please arrive 30 minutes before your scheduled appointment time.  If you arrive late for your appointment, you may be asked to reschedule.  We strive to give you quality time with our providers, and arriving late affects you and other patients whose appointments are after yours.   If you are a no show for multiple scheduled visits, you may be dismissed from the clinic at the providers discretion.    Again, thank you for choosing [facility], our hope is that these requests will decrease the amount of time that you wait before being seen by our physicians.  ______________________________________________________________________  Should you have questions after your visit to the [facility], please contact our office at [phone] between the hours of 8:30 and 4:30 p.m.    Voicemails left after 4:30p.m will not be returned until the following business day.    For prescription refill requests, please have your pharmacy contact us directly.  Please also try to allow 48 hours for prescription requests.    Please contact the scheduling department for questions regarding scheduling.  For scheduling of procedures such as PET scans, CT scans, MRI, Ultrasound, etc please contact central scheduling at [phone].    Resources For Cancer Patients and Caregivers:   Oncolink.org:  A wonderful resource for patients and healthcare providers for information regarding your disease, ways to tract your treatment, what to expect, etc.     American Cancer Society:  800-227-2345  Can help patients locate various types of support and financial assistance  Cancer Care: 1-800-813-HOPE (4673) Provides financial assistance, online support groups, medication/co-pay assistance.    [place] DSS:  336-641-3447 Where to apply for food  stamps, Medicaid, and utility assistance  Medicare Rights Center: 800-333-4114 Helps people with Medicare understand their rights and benefits, navigate the Medicare system, and secure the quality healthcare they deserve  SCAT: 336-333-6589 [place] [employer]'s shared-ride transportation service for eligible riders who have a disability that prevents them from riding the fixed route bus.    For additional information on assistance programs please contact our social worker:   Grier Hock/Abigail Elmore:  [phone]            

## 2017-04-02 NOTE — Progress Notes (Signed)
[name]    HEMATOLOGY/ONCOLOGY Clinic NOTE  Date of Service: .04/02/2017  Patient Care Team: Eulas Post, MD as PCP - General (Family Medicine)  CHIEF COMPLAINTS/PURPOSE OF CONSULTATION:  Newly diagnosed mantle cell lymphoma  HISTORY OF PRESENTING ILLNESS:   [name]. is a wonderful 83 y.o. male who is transferring care to me for continued evaluation and management of newly diagnosed Mantle cell lymphoma.  Patient is a very pleasant 83 year old with a history of hypertension, dyslipidemia, psoriasis, fibromyalgia, coronary artery disease status post PCI in 2009, GERD who presented with episodic rectal bleeding and weight loss of about 40 pounds since November 2017. His daughter who is a Marine scientist and is present at this visit notes that his weight has dropped from 216 down to 175 pounds. He has also been having bothersome diarrhea that has significantly affected his quality of life.  Patient was evaluated by GI and had a colonoscopy in 12/30/2016 in which his terminal ileum was moderately inflamed and ulcerated, biopsies were taken with cold forceps, 2 sessile polyps were found in the ascending colon, the rectosigmoid region was moderately inflamed, biopsies were taken exam otherwise was normal underdirect retroflexion views. A clinical diagnosis has been made as ileocolitis by the gastroenterologist  However pathologist has reported,small bowel mucosa with atypical lymphoid infiltrates and scattered active inflammation.  A right colon biopsy also showed colonic mucosa with atypical lymphoid infiltrates and scattered active inflammatory cells.  Surgical biopsy of the ascending colon polyp wasdocumented as tubular adenomawith the atypical lymphoid infiltrates.   Left colon biopsy also has revealed atypical lymphoid infiltrates with scattered inflammatory cells. Biopsy of the rectal mucosa has revealed atypical lymphoid infiltrates and active inflammatory cells  without any dysplasia.   Microscopic examination with special stains has revealed infiltrates positive for CD20 cells, positive for BCL 6 and BCL-2 with high Ki 67 at 50% however FISH studies showed no evidence of BCL 6 or BCL-2 disruption. Cells were noted to be cyclin D1 positive. [place] for t(11;14) was not noted to be negative however in tumor Board the pathologist noted that this was likely due to limited sampling. The consensus from pathology was this was consistent overall with a mantle cell lymphoma.  Patient subsequently had a bone marrow biopsy which appeared consistent with mantle cell lymphoma.  PET/CT scan was done on 01/30/2017 and showed scattered hypermetabolic lymphadenopathy in the left neck mediastinum and hilum and right lower quadrant of the abdomen. Possible right colon lesion.  Patient along with his daughter who is an Therapist, sports and his wife are here to discuss treatment options. Patient notes he is very fatigued and is losing weight and is hoping to get started as soon as possible on treatment.  Daughter notes that he has been functioning quite well prior to the diarrhea and weight loss and was able to function independently. Patient notes that the diarrhea is bothering him the most and he has a fair amount of urgency.  No issues with urination.  Notes some chills and night sweats. Notes that he is following with Dr. Ardis Hughs and was on Canasa and hydrocortisone enemas.  After his weight loss he notes his blood pressure has been running somewhat low and this has led to a significant decrease in his blood pressure medications.  We discussed treatment options in details and informed consent was obtained to proceed with bendamustine and Rituxan chemo-immunotherapy .  INTERVAL HISTORY  Mr. Hreha is here for his scheduled f/u prior to his 2nd cycle of  BR for mantle cell lymphoma. He notes improved diarrhea. No overt GI bleeding. Some grade 1 fatigue. No  fevers/chills/nightsweats. No CP/SOB. He feels ready to proceed with his 2nd cycle of treatment.  MEDICAL HISTORY:  Past Medical History:  Diagnosis Date  . Coronary artery disease    a. stent (promus) Cx/OM'09  . Fibromyalgia dx'd ~ 04/2015  . GERD (gastroesophageal reflux disease)   . Gout   . HEARING LOSS    "temporary"  . Hyperlipidemia   . HYPERLIPIDEMIA 10/13/2007  . Hypertension   . HYPERTENSION 10/13/2007  . Osteoarthritis    "left shoulder" (08/23/2015)  . Prostatitis, acute   . PROSTATITIS, ACUTE, HX OF 10/13/2007  . PSORIASIS 10/13/2007  . PVC (premature ventricular contraction)    hx  . SKIN CANCER, RECURRENT 10/13/2007    SURGICAL HISTORY: Past Surgical History:  Procedure Laterality Date  . APPENDECTOMY    . CORONARY ANGIOPLASTY WITH STENT PLACEMENT    . CYST EXCISION Right X 2   shoulder  . ELECTROLYSIS OF MISDIRECTED LASHES Bilateral    eyelashes are misdirected and grow inwards   . EYE SURGERY    . MASS EXCISION Right 01/2005   proximal thigh soft tissue mass  . TEAR DUCT PROBING Left   . TYMPANOPLASTY Bilateral    "for hearing loss; put tubes in also, 2X on right, 1X on the left; tubes worked"  . VASECTOMY      SOCIAL HISTORY: Social History   Social History  . Marital status: Married    Spouse name: N/A  . Number of children: 3  . Years of education: N/A   Occupational History  . retired Retired    from [employer].   Social History Main Topics  . Smoking status: Never Smoker  . Smokeless tobacco: Never Used  . Alcohol use No  . Drug use: No  . Sexual activity: No   Other Topics Concern  . Not on file   Social History Narrative   Married 1958   2 daughters- '59, 68, 1 son '61   8 grandchildren   Retired from [employer], works PT as a Curator. Now retired completely (09/2008)   End of life; does not want heroic measures if in a persistent vegative state    FAMILY HISTORY: Family History  Problem Relation Age of Onset  . Heart attack  Father 25       died  . Heart disease Father   . Parkinsonism Mother 75       died  . Breast cancer Sister        died  . Lung cancer Unknown        uncle died  . Colon cancer Neg Hx     ALLERGIES:  is allergic to niacin.  MEDICATIONS:  Current Outpatient Prescriptions  Medication Sig Dispense Refill  . allopurinol (ZYLOPRIM) 100 MG tablet Take 2 tablets (200 mg total) by mouth daily. (Patient taking differently: Take 100 mg by mouth daily. ) 60 tablet 0  . carvedilol (COREG) 6.25 MG tablet Take 1 tablet (6.25 mg total) by mouth 2 (two) times daily. 60 tablet 11  . dexamethasone (DECADRON) 4 MG tablet Take 2 tablets (8 mg total) by mouth daily. Start the day after bendamustine chemotherapy for 2 days. Take with food. 30 tablet 1  . fluticasone (FLONASE) 50 MCG/ACT nasal spray Place 1-2 sprays into both nostrils daily as needed for rhinitis.    . furosemide (LASIX) 20 MG tablet Take 1 tablet (20 mg  total) by mouth daily as needed for edema. 30 tablet 0  . hydrocortisone (ANUSOL-HC) 2.5 % rectal cream Place rectally 3 (three) times daily. (Patient taking differently: Place 1 application rectally 3 (three) times daily. ) 30 g 0  . hydrocortisone (ANUSOL-HC) 25 MG suppository Place 1 suppository (25 mg total) rectally 2 (two) times daily. 12 suppository 0  . magic mouthwash SOLN Take 5 mLs by mouth 3 (three) times daily. 1 Part Viscous lidocaine 2% 1 Part Maalox 1 Part Nystatin 500,000 IU per 5 mL 200 mL 0  . mesalamine (CANASA) 1000 MG suppository Place 1 suppository (1,000 mg total) rectally at bedtime. 30 suppository 6  . nitroGLYCERIN (NITROSTAT) 0.4 MG SL tablet DISSOLVE ONE TABLET UNDER THE TONGUE EVERY 5 MINUTES AS NEEDED (Patient taking differently: Place 0.4 mg under the tongue every 5 (five) minutes as needed for chest pain. ) 25 tablet 12  . nystatin (MYCOSTATIN) 100000 UNIT/ML suspension Take 5 mLs (500,000 Units total) by mouth 3 (three) times daily. 60 mL 1  . ondansetron  (ZOFRAN) 8 MG tablet Take 1 tablet (8 mg total) by mouth 2 (two) times daily as needed for refractory nausea / vomiting. Start on day 2 after bendamustine chemo. 30 tablet 1  . pantoprazole (PROTONIX) 40 MG tablet Take 1 tablet (40 mg total) by mouth daily. 30 tablet 0  . potassium chloride SA (K-DUR,KLOR-CON) 20 MEQ tablet Take 1 tablet (20 mEq total) by mouth daily. 30 tablet 1  . prochlorperazine (COMPAZINE) 10 MG tablet Take 1 tablet (10 mg total) by mouth every 6 (six) hours as needed (Nausea or vomiting). 30 tablet 1  . rosuvastatin (CRESTOR) 10 MG tablet Take one tablet by mouth daily prior to bedtime (Patient taking differently: Take 10 mg by mouth at bedtime. ) 90 tablet 3   Current Facility-Administered Medications  Medication Dose Route Frequency Provider Last Rate Last Dose  . 0.9 %  sodium chloride infusion  500 mL Intravenous Continuous Milus Banister, MD        REVIEW OF SYSTEMS:    10 Point review of Systems was done is negative except as noted above.  PHYSICAL EXAMINATION: ECOG PERFORMANCE STATUS: 2 - Symptomatic, <50% confined to bed  . Vitals:   04/02/17 0952  BP: 105/71  Pulse: 64  Resp: 18  Temp: 98.6 F (37 C)   Filed Weights   04/02/17 0952  Weight: 173 lb 9.6 oz (78.7 kg)   .Body mass index is 24.21 kg/m.  GENERAL:alert, in no acute distress and comfortable SKIN: no acute rashes, no significant lesions EYES: conjunctiva are pink and non-injected, sclera anicteric OROPHARYNX: MMM, no exudates, no oropharyngeal erythema or ulceration NECK: supple, no JVD LYMPH:  Small palpable lymph nodes in the left neck , no palpable axillary or inguinal lymphadenopathy  LUNGS: clear to auscultation b/l with normal respiratory effort HEART: regular rate & rhythm ABDOMEN:  normoactive bowel sounds , non tender, not distended. Extremity: 2+ pitting pedal edema bilaterally PSYCH: alert & oriented x 3 with fluent speech NEURO: no focal motor/sensory  deficits  LABORATORY DATA:  I have reviewed the data as listed . Component     Latest Ref Rng & Units 04/02/2017  WBC     4.0 - 10.3 10e3/uL 4.0  NEUT#     1.5 - 6.5 10e3/uL 1.9  Hemoglobin     13.0 - 17.1 g/dL 11.0 (L)  HCT     38.4 - 49.9 % 34.6 (L)  Platelets  140 - 400 10e3/uL 175  MCV     79.3 - 98.0 fL 95.6  MCH     27.2 - 33.4 pg 30.4  MCHC     32.0 - 36.0 g/dL 31.8 (L)  RBC     4.20 - 5.82 10e6/uL 3.62 (L)  RDW     11.0 - 14.6 % 17.3 (H)  lymph#     0.9 - 3.3 10e3/uL 0.9  MONO#     0.1 - 0.9 10e3/uL 0.8  Eosinophils Absolute     0.0 - 0.5 10e3/uL 0.3  Basophils Absolute     0.0 - 0.1 10e3/uL 0.0  NEUT%     39.0 - 75.0 % 48.4  LYMPH%     14.0 - 49.0 % 22.7  MONO%     0.0 - 14.0 % 20.9 (H)  EOS%     0.0 - 7.0 % 7.5 (H)  BASO%     0.0 - 2.0 % 0.5  nRBC     0 - 0 % 0  Retic %     0.80 - 1.80 % 1.12  Retic Ct Abs     34.80 - 93.90 10e3/uL 40.54  Immature Retic Fract     3.00 - 10.60 % 4.70  Sodium     136 - 145 mEq/L 140  Potassium     3.5 - 5.1 mEq/L 4.4  Chloride     98 - 109 mEq/L 108  CO2     22 - 29 mEq/L 27  Glucose     70 - 140 mg/dl 83  BUN     7.0 - 26.0 mg/dL 9.2  Creatinine     0.7 - 1.3 mg/dL 0.8  Total Bilirubin     0.20 - 1.20 mg/dL 0.43  Alkaline Phosphatase     40 - 150 U/L 91  AST     5 - 34 U/L 17  ALT     0 - 55 U/L 13  Total Protein     6.4 - 8.3 g/dL 4.9 (L)  Albumin     3.5 - 5.0 g/dL 2.6 (L)  Calcium     8.4 - 10.4 mg/dL 8.3 (L)  Anion gap     3 - 11 mEq/L 5  EGFR     >90 ml/min/1.73 m2 82 (L)  LDH     125 - 245 U/L 231    Lab Results  Component Value Date   LDH 164 02/20/2017   Uric acid 5.8           RADIOGRAPHIC STUDIES: I have personally reviewed the radiological images as listed and agreed with the findings in the report. Dg Chest 2 View  Result Date: 03/19/2017 CLINICAL DATA:  Shortness of breath EXAM: CHEST  2 VIEW COMPARISON:  03/17/2017 FINDINGS: Normal heart size and  mediastinal contours. No acute infiltrate or edema. Minimal atelectasis or scarring is stable. No effusion or pneumothorax. No acute osseous findings. IMPRESSION: No evidence of active disease.  Stable compared to prior. Electronically Signed   By: Monte Fantasia M.D.   On: 03/19/2017 15:17   Dg Chest 2 View  Result Date: 03/17/2017 CLINICAL DATA:  5 minute episode of shortness of breath while walking today. History of non-Hodgkin's lymphoma. EXAM: CHEST  2 VIEW COMPARISON:  Chest radiograph September 27, 2016 FINDINGS: Cardiomediastinal silhouette is normal. No pleural effusions or focal consolidations. Unchanged strandy densities LEFT lung base. Trachea projects midline and there is no pneumothorax. Soft tissue planes and included  osseous structures are non-suspicious. IMPRESSION: Stable LEFT lung base atelectasis/ scarring. Electronically Signed   By: Elon Alas M.D.   On: 03/17/2017 16:02   US Renal  Result Date: 03/18/2017 CLINICAL DATA:  Elevated creatinine EXAM: RENAL / URINARY TRACT ULTRASOUND COMPLETE COMPARISON:  PET-CT 01/30/2017 FINDINGS: Right Kidney: Length: 11.2 cm. Echogenicity within normal limits. No mass or hydronephrosis visualized. Left Kidney: Length: 13 cm. Echogenicity within normal limits. No mass or hydronephrosis visualized. Bladder: Appears normal for degree of bladder distention. IMPRESSION: Negative renal ultrasound Electronically Signed   By: Donavan Foil M.D.   On: 03/18/2017 15:16   Nm Pulmonary Perf And Vent  Result Date: 03/19/2017 CLINICAL DATA:  Dyspnea. EXAM: NUCLEAR MEDICINE VENTILATION - PERFUSION LUNG SCAN TECHNIQUE: Ventilation images were obtained in multiple projections using inhaled aerosol Tc-4mDTPA. Perfusion images were obtained in multiple projections after intravenous injection of Tc-936mAA. RADIOPHARMACEUTICALS:  31.4 mCi Technetium-9948mPA aerosol inhalation and 3.9 mCi Technetium-64m74m IV COMPARISON:  None. FINDINGS: Ventilation: No focal  ventilation defect. Perfusion: No wedge shaped peripheral perfusion defects to suggest acute pulmonary embolism. IMPRESSION: Normal ventilation-perfusion lung scan. No evidence suggestive of pulmonary embolism. Electronically Signed   By: JameLorriane Shire.   On: 03/19/2017 11:37    ASSESSMENT & PLAN:  83 y63r old male with  #1 Stage IVBE Mantle cell lymphoma He has hypermetabolic lymphadenopathy and extensive bowel involvement. Has significant constitutional symptoms including debilitating fatigue , weight loss of about 40 pounds since November 2017, anorexia, some subjective chills.  #2 persistent bothersome diarrhea and some rectal bleeding. Hemoglobin is stable at this time. This appears to be likely related to his mantle cell lymphoma. Could also have an underlying intermittent disorder. Is on Canasa and hydrocortisone suppositories as per GI. Patient's diarrhea has significantly improved. Only minimal hemorrhoidal bleeding at this time.  #3 moderate to severe protein calorie malnutrition with significant weight loss- due to lymphoma and diarrhea. Slowly improving nutritional status with still hypoalbuminemic  Plan -Labs relatively stable. Creatinine normalized. -patient will proceed with 2nd cycle of BR with bendamustine dose reduced to 70mg43m. -continue Neulasta support due to limited BM reserve given patients age. -Continue follow-up with gastroenterology for management of possible inflammatory bowel disease. -compression socks for his leg swelling. -Encouraged him to maintain good nutrition, good fluid intake and walk at least 20-30 minutes a day. -RPT labs in 7-8 days done - stable. No evidence of dehydration. -continue C3 as per orders. -We shall plan to get PET/CT before C4 of treatment to evaluate treatment response. . Orders Placed This Encounter  Procedures  . CBC & Diff and Retic    Standing Status:   Future    Number of Occurrences:   1    Standing Expiration Date:    04/02/2018  . Comprehensive metabolic panel    Standing Status:   Future    Number of Occurrences:   1    Standing Expiration Date:   04/02/2018  . Magnesium    Standing Status:   Future    Number of Occurrences:   1    Standing Expiration Date:   04/02/2018  . CBC & Diff and Retic    Standing Status:   Future    Number of Occurrences:   1    Standing Expiration Date:   04/02/2018  . Comprehensive metabolic panel    Standing Status:   Future    Number of Occurrences:   1    Standing Expiration Date:  04/02/2018    Rpt labs in 7-8 days RTC with Dr Irene Limbo 05/23/2017 Continue treatment as per plan  All of the patients questions were answered with apparent satisfaction. The patient knows to call the clinic with any problems, questions or concerns.  I spent 20 minutes counseling the patient face to face. The total time spent in the appointment was 30 minutes and more than 50% was on counseling and direct patient cares.    Sullivan Lone MD [place] AAHIVMS [facility] Hematology/Oncology Physician [facility]  (Office):       [phone] (Work cell):  [phone] (Fax):           [phone]  04/02/2017 9:56 AM

## 2017-04-02 NOTE — Patient Instructions (Addendum)
[place] Discharge Instructions for Patients Receiving Chemotherapy  Today you received the following chemotherapy agents rituximab (Rituxan) and bendamustine Verl Dicker).  To help prevent nausea and vomiting after your treatment, we encourage you to take your nausea medication as directed by your doctor.   If you develop nausea and vomiting that is not controlled by your nausea medication, call the clinic.   BELOW ARE SYMPTOMS THAT SHOULD BE REPORTED IMMEDIATELY:  *FEVER GREATER THAN 100.5 F  *CHILLS WITH OR WITHOUT FEVER  NAUSEA AND VOMITING THAT IS NOT CONTROLLED WITH YOUR NAUSEA MEDICATION  *UNUSUAL SHORTNESS OF BREATH  *UNUSUAL BRUISING OR BLEEDING  TENDERNESS IN MOUTH AND THROAT WITH OR WITHOUT PRESENCE OF ULCERS  *URINARY PROBLEMS  *BOWEL PROBLEMS  UNUSUAL RASH Items with * indicate a potential emergency and should be followed up as soon as possible.  Feel free to call the clinic you have any questions or concerns. The clinic phone number is [phone].  Please show the [facility] at check-in to the Emergency Department and triage nurse.

## 2017-04-03 ENCOUNTER — Ambulatory Visit (HOSPITAL_BASED_OUTPATIENT_CLINIC_OR_DEPARTMENT_OTHER): Payer: Medicare Other

## 2017-04-03 ENCOUNTER — Telehealth: Payer: Self-pay | Admitting: Hematology

## 2017-04-03 VITALS — BP 112/65 | HR 57 | Temp 97.8°F | Resp 18

## 2017-04-03 DIAGNOSIS — Z5112 Encounter for antineoplastic immunotherapy: Secondary | ICD-10-CM

## 2017-04-03 DIAGNOSIS — C8318 Mantle cell lymphoma, lymph nodes of multiple sites: Secondary | ICD-10-CM

## 2017-04-03 MED ORDER — SODIUM CHLORIDE 0.9 % IV SOLN
Freq: Once | INTRAVENOUS | Status: AC
  Administered 2017-04-03: 12:00:00 via INTRAVENOUS

## 2017-04-03 MED ORDER — DEXAMETHASONE SODIUM PHOSPHATE 10 MG/ML IJ SOLN
INTRAMUSCULAR | Status: AC
  Filled 2017-04-03: qty 1

## 2017-04-03 MED ORDER — DEXAMETHASONE SODIUM PHOSPHATE 10 MG/ML IJ SOLN
10.0000 mg | Freq: Once | INTRAMUSCULAR | Status: AC
  Administered 2017-04-03: 10 mg via INTRAVENOUS

## 2017-04-03 MED ORDER — SODIUM CHLORIDE 0.9 % IV SOLN
70.0000 mg/m2 | Freq: Once | INTRAVENOUS | Status: AC
  Administered 2017-04-03: 150 mg via INTRAVENOUS
  Filled 2017-04-03: qty 6

## 2017-04-03 NOTE — Patient Instructions (Signed)
[facility] Discharge Instructions for Patients Receiving Chemotherapy  Today you received the following chemotherapy agents Bendeka.   To help prevent nausea and vomiting after your treatment, we encourage you to take your nausea medication as directed.    If you develop nausea and vomiting that is not controlled by your nausea medication, call the clinic.   BELOW ARE SYMPTOMS THAT SHOULD BE REPORTED IMMEDIATELY:  *FEVER GREATER THAN 100.5 F  *CHILLS WITH OR WITHOUT FEVER  NAUSEA AND VOMITING THAT IS NOT CONTROLLED WITH YOUR NAUSEA MEDICATION  *UNUSUAL SHORTNESS OF BREATH  *UNUSUAL BRUISING OR BLEEDING  TENDERNESS IN MOUTH AND THROAT WITH OR WITHOUT PRESENCE OF ULCERS  *URINARY PROBLEMS  *BOWEL PROBLEMS  UNUSUAL RASH Items with * indicate a potential emergency and should be followed up as soon as possible.  Feel free to call the clinic you have any questions or concerns. The clinic phone number is [phone].  Please show the CHEMO ALERT CARD at check-in to the Emergency Department and triage nurse.   

## 2017-04-03 NOTE — Telephone Encounter (Signed)
Scheduled appt per 6/6 los - left message for patient - additional appts added and to pick up new schedule next visit

## 2017-04-05 ENCOUNTER — Ambulatory Visit (HOSPITAL_BASED_OUTPATIENT_CLINIC_OR_DEPARTMENT_OTHER): Payer: Medicare Other

## 2017-04-05 VITALS — BP 130/57 | HR 52 | Temp 98.0°F | Resp 16

## 2017-04-05 DIAGNOSIS — C8318 Mantle cell lymphoma, lymph nodes of multiple sites: Secondary | ICD-10-CM | POA: Diagnosis present

## 2017-04-05 DIAGNOSIS — Z5189 Encounter for other specified aftercare: Secondary | ICD-10-CM | POA: Diagnosis not present

## 2017-04-05 MED ORDER — PEGFILGRASTIM INJECTION 6 MG/0.6ML ~~LOC~~
6.0000 mg | PREFILLED_SYRINGE | Freq: Once | SUBCUTANEOUS | Status: AC
  Administered 2017-04-05: 6 mg via SUBCUTANEOUS

## 2017-04-05 NOTE — Patient Instructions (Signed)
Pegfilgrastim injection What is this medicine? PEGFILGRASTIM (PEG fil gra stim) is a long-acting granulocyte colony-stimulating factor that stimulates the growth of neutrophils, a type of white blood cell important in the body's fight against infection. It is used to reduce the incidence of fever and infection in patients with certain types of cancer who are receiving chemotherapy that affects the bone marrow, and to increase survival after being exposed to high doses of radiation. This medicine may be used for other purposes; ask your health care provider or pharmacist if you have questions. COMMON BRAND NAME(S): Neulasta What should I tell my health care provider before I take this medicine? They need to know if you have any of these conditions: -kidney disease -latex allergy -ongoing radiation therapy -sickle cell disease -skin reactions to acrylic adhesives (On-Body Injector only) -an unusual or allergic reaction to pegfilgrastim, filgrastim, other medicines, foods, dyes, or preservatives -pregnant or trying to get pregnant -breast-feeding How should I use this medicine? This medicine is for injection under the skin. If you get this medicine at home, you will be taught how to prepare and give the pre-filled syringe or how to use the On-body Injector. Refer to the patient Instructions for Use for detailed instructions. Use exactly as directed. Tell your healthcare provider immediately if you suspect that the On-body Injector may not have performed as intended or if you suspect the use of the On-body Injector resulted in a missed or partial dose. It is important that you put your used needles and syringes in a special sharps container. Do not put them in a trash can. If you do not have a sharps container, call your pharmacist or healthcare provider to get one. Talk to your pediatrician regarding the use of this medicine in children. While this drug may be prescribed for selected conditions,  precautions do apply. Overdosage: If you think you have taken too much of this medicine contact a poison control center or emergency room at once. NOTE: This medicine is only for you. Do not share this medicine with others. What if I miss a dose? It is important not to miss your dose. Call your doctor or health care professional if you miss your dose. If you miss a dose due to an On-body Injector failure or leakage, a new dose should be administered as soon as possible using a single prefilled syringe for manual use. What may interact with this medicine? Interactions have not been studied. Give your health care provider a list of all the medicines, herbs, non-prescription drugs, or dietary supplements you use. Also tell them if you smoke, drink alcohol, or use illegal drugs. Some items may interact with your medicine. This list may not describe all possible interactions. Give your health care provider a list of all the medicines, herbs, non-prescription drugs, or dietary supplements you use. Also tell them if you smoke, drink alcohol, or use illegal drugs. Some items may interact with your medicine. What should I watch for while using this medicine? You may need blood work done while you are taking this medicine. If you are going to need a MRI, CT scan, or other procedure, tell your doctor that you are using this medicine (On-Body Injector only). What side effects may I notice from receiving this medicine? Side effects that you should report to your doctor or health care professional as soon as possible: -allergic reactions like skin rash, itching or hives, swelling of the face, lips, or tongue -dizziness -fever -pain, redness, or irritation at site   where injected -pinpoint red spots on the skin -red or dark-brown urine -shortness of breath or breathing problems -stomach or side pain, or pain at the shoulder -swelling -tiredness -trouble passing urine or change in the amount of urine Side  effects that usually do not require medical attention (report to your doctor or health care professional if they continue or are bothersome): -bone pain -muscle pain This list may not describe all possible side effects. Call your doctor for medical advice about side effects. You may report side effects to FDA at 1-800-FDA-1088. Where should I keep my medicine? Keep out of the reach of children. Store pre-filled syringes in a refrigerator between 2 and 8 degrees C (36 and 46 degrees F). Do not freeze. Keep in carton to protect from light. Throw away this medicine if it is left out of the refrigerator for more than 48 hours. Throw away any unused medicine after the expiration date. NOTE: This sheet is a summary. It may not cover all possible information. If you have questions about this medicine, talk to your doctor, pharmacist, or health care provider.  2018 Elsevier/Gold Standard (2016-10-10 12:58:03)  

## 2017-04-09 ENCOUNTER — Other Ambulatory Visit (HOSPITAL_BASED_OUTPATIENT_CLINIC_OR_DEPARTMENT_OTHER): Payer: Medicare Other

## 2017-04-09 DIAGNOSIS — C8318 Mantle cell lymphoma, lymph nodes of multiple sites: Secondary | ICD-10-CM

## 2017-04-09 LAB — COMPREHENSIVE METABOLIC PANEL
ALBUMIN: 2.6 g/dL — AB (ref 3.5–5.0)
ALT: 15 U/L (ref 0–55)
AST: 17 U/L (ref 5–34)
Alkaline Phosphatase: 144 U/L (ref 40–150)
Anion Gap: 8 mEq/L (ref 3–11)
BILIRUBIN TOTAL: 0.61 mg/dL (ref 0.20–1.20)
BUN: 9 mg/dL (ref 7.0–26.0)
CO2: 29 meq/L (ref 22–29)
CREATININE: 0.9 mg/dL (ref 0.7–1.3)
Calcium: 8.3 mg/dL — ABNORMAL LOW (ref 8.4–10.4)
Chloride: 104 mEq/L (ref 98–109)
EGFR: 76 mL/min/{1.73_m2} — AB (ref >90)
GLUCOSE: 84 mg/dL (ref 70–140)
Potassium: 4.2 mEq/L (ref 3.5–5.1)
SODIUM: 141 meq/L (ref 136–145)
TOTAL PROTEIN: 4.9 g/dL — AB (ref 6.4–8.3)

## 2017-04-09 LAB — CBC & DIFF AND RETIC
BASO%: 0.3 % (ref 0.0–2.0)
Basophils Absolute: 0.1 10*3/uL (ref 0.0–0.1)
EOS ABS: 0.5 10*3/uL (ref 0.0–0.5)
EOS%: 2.5 % (ref 0.0–7.0)
HCT: 36.2 % — ABNORMAL LOW (ref 38.4–49.9)
HEMOGLOBIN: 11.4 g/dL — AB (ref 13.0–17.1)
IMMATURE RETIC FRACT: 12.6 % — AB (ref 3.00–10.60)
LYMPH#: 1.1 10*3/uL (ref 0.9–3.3)
LYMPH%: 5.7 % — ABNORMAL LOW (ref 14.0–49.0)
MCH: 30.2 pg (ref 27.2–33.4)
MCHC: 31.5 g/dL — ABNORMAL LOW (ref 32.0–36.0)
MCV: 96 fL (ref 79.3–98.0)
MONO#: 1 10*3/uL — AB (ref 0.1–0.9)
MONO%: 5.5 % (ref 0.0–14.0)
NEUT%: 86 % — ABNORMAL HIGH (ref 39.0–75.0)
NEUTROS ABS: 16 10*3/uL — AB (ref 1.5–6.5)
Platelets: 124 10*3/uL — ABNORMAL LOW (ref 140–400)
RBC: 3.77 10*6/uL — AB (ref 4.20–5.82)
RDW: 18.2 % — ABNORMAL HIGH (ref 11.0–14.6)
RETIC %: 2.02 % — AB (ref 0.80–1.80)
RETIC CT ABS: 76.15 10*3/uL (ref 34.80–93.90)
WBC: 18.6 10*3/uL — AB (ref 4.0–10.3)

## 2017-04-09 LAB — MAGNESIUM: Magnesium: 2 mg/dl (ref 1.5–2.5)

## 2017-04-17 ENCOUNTER — Ambulatory Visit (INDEPENDENT_AMBULATORY_CARE_PROVIDER_SITE_OTHER): Payer: Medicare Other

## 2017-04-17 DIAGNOSIS — R000 Tachycardia, unspecified: Secondary | ICD-10-CM

## 2017-04-23 ENCOUNTER — Other Ambulatory Visit: Payer: Self-pay | Admitting: Hematology

## 2017-04-23 ENCOUNTER — Other Ambulatory Visit: Payer: Self-pay

## 2017-04-23 MED ORDER — PANTOPRAZOLE SODIUM 40 MG PO TBEC: 40.0000 mg | DELAYED_RELEASE_TABLET | Freq: Every day | ORAL | 3 refills | Status: DC

## 2017-04-24 ENCOUNTER — Other Ambulatory Visit: Payer: Self-pay | Admitting: Internal Medicine

## 2017-04-25 ENCOUNTER — Telehealth: Payer: Self-pay | Admitting: Gastroenterology

## 2017-04-25 NOTE — Telephone Encounter (Signed)
He does not need to resume the suppositories

## 2017-04-25 NOTE — Telephone Encounter (Signed)
Pt has been notified that Dr Ardis Hughs is out of the office today and I will contact him on Monday with the recommendations.  Dr Ardis Hughs please advise

## 2017-04-28 NOTE — Telephone Encounter (Signed)
Pt has been advised he does not need to restart suppositories

## 2017-05-01 ENCOUNTER — Other Ambulatory Visit (HOSPITAL_BASED_OUTPATIENT_CLINIC_OR_DEPARTMENT_OTHER): Payer: Medicare Other

## 2017-05-01 ENCOUNTER — Ambulatory Visit (HOSPITAL_BASED_OUTPATIENT_CLINIC_OR_DEPARTMENT_OTHER): Payer: Medicare Other

## 2017-05-01 VITALS — BP 138/59 | HR 55 | Temp 98.0°F | Resp 16

## 2017-05-01 DIAGNOSIS — C8318 Mantle cell lymphoma, lymph nodes of multiple sites: Secondary | ICD-10-CM | POA: Diagnosis not present

## 2017-05-01 DIAGNOSIS — Z5111 Encounter for antineoplastic chemotherapy: Secondary | ICD-10-CM | POA: Diagnosis not present

## 2017-05-01 DIAGNOSIS — Z5112 Encounter for antineoplastic immunotherapy: Secondary | ICD-10-CM | POA: Diagnosis present

## 2017-05-01 LAB — CBC & DIFF AND RETIC
BASO%: 1 % (ref 0.0–2.0)
BASOS ABS: 0.1 10*3/uL (ref 0.0–0.1)
EOS ABS: 0.2 10*3/uL (ref 0.0–0.5)
EOS%: 4.6 % (ref 0.0–7.0)
HEMATOCRIT: 36.1 % — AB (ref 38.4–49.9)
HEMOGLOBIN: 11.5 g/dL — AB (ref 13.0–17.1)
IMMATURE RETIC FRACT: 6.8 % (ref 3.00–10.60)
LYMPH#: 1.7 10*3/uL (ref 0.9–3.3)
LYMPH%: 34.5 % (ref 14.0–49.0)
MCH: 31.5 pg (ref 27.2–33.4)
MCHC: 31.9 g/dL — ABNORMAL LOW (ref 32.0–36.0)
MCV: 98.9 fL — AB (ref 79.3–98.0)
MONO#: 0.8 10*3/uL (ref 0.1–0.9)
MONO%: 17.3 % — ABNORMAL HIGH (ref 0.0–14.0)
NEUT#: 2.1 10*3/uL (ref 1.5–6.5)
NEUT%: 42.6 % (ref 39.0–75.0)
PLATELETS: 242 10*3/uL (ref 140–400)
RBC: 3.65 10*6/uL — ABNORMAL LOW (ref 4.20–5.82)
RDW: 17.8 % — ABNORMAL HIGH (ref 11.0–14.6)
RETIC CT ABS: 64.97 10*3/uL (ref 34.80–93.90)
Retic %: 1.78 % (ref 0.80–1.80)
WBC: 4.8 10*3/uL (ref 4.0–10.3)

## 2017-05-01 LAB — COMPREHENSIVE METABOLIC PANEL
ALBUMIN: 2.8 g/dL — AB (ref 3.5–5.0)
ALK PHOS: 77 U/L (ref 40–150)
ALT: 10 U/L (ref 0–55)
ANION GAP: 8 meq/L (ref 3–11)
AST: 23 U/L (ref 5–34)
BILIRUBIN TOTAL: 0.44 mg/dL (ref 0.20–1.20)
BUN: 10.1 mg/dL (ref 7.0–26.0)
CO2: 26 meq/L (ref 22–29)
CREATININE: 0.8 mg/dL (ref 0.7–1.3)
Calcium: 9 mg/dL (ref 8.4–10.4)
Chloride: 108 mEq/L (ref 98–109)
EGFR: 83 mL/min/{1.73_m2} — AB (ref >90)
Glucose: 88 mg/dl (ref 70–140)
Potassium: 3.9 mEq/L (ref 3.5–5.1)
Sodium: 142 mEq/L (ref 136–145)
TOTAL PROTEIN: 5.2 g/dL — AB (ref 6.4–8.3)

## 2017-05-01 MED ORDER — DIPHENHYDRAMINE HCL 25 MG PO CAPS
50.0000 mg | ORAL_CAPSULE | Freq: Once | ORAL | Status: AC
Start: 2017-05-01 — End: 2017-05-01
  Administered 2017-05-01: 50 mg via ORAL

## 2017-05-01 MED ORDER — DEXAMETHASONE SODIUM PHOSPHATE 10 MG/ML IJ SOLN
INTRAMUSCULAR | Status: AC
  Filled 2017-05-01: qty 1

## 2017-05-01 MED ORDER — PALONOSETRON HCL INJECTION 0.25 MG/5ML
INTRAVENOUS | Status: AC
  Filled 2017-05-01: qty 5

## 2017-05-01 MED ORDER — ACETAMINOPHEN 325 MG PO TABS
650.0000 mg | ORAL_TABLET | Freq: Once | ORAL | Status: AC
Start: 2017-05-01 — End: 2017-05-01
  Administered 2017-05-01: 650 mg via ORAL

## 2017-05-01 MED ORDER — SODIUM CHLORIDE 0.9 % IV SOLN
Freq: Once | INTRAVENOUS | Status: AC
  Administered 2017-05-01: 09:00:00 via INTRAVENOUS

## 2017-05-01 MED ORDER — ACETAMINOPHEN 325 MG PO TABS
ORAL_TABLET | ORAL | Status: AC
  Filled 2017-05-01: qty 2

## 2017-05-01 MED ORDER — SODIUM CHLORIDE 0.9 % IV SOLN
70.0000 mg/m2 | Freq: Once | INTRAVENOUS | Status: AC
  Administered 2017-05-01: 150 mg via INTRAVENOUS
  Filled 2017-05-01: qty 6

## 2017-05-01 MED ORDER — DIPHENHYDRAMINE HCL 25 MG PO CAPS
ORAL_CAPSULE | ORAL | Status: AC
  Filled 2017-05-01: qty 2

## 2017-05-01 MED ORDER — DEXAMETHASONE SODIUM PHOSPHATE 10 MG/ML IJ SOLN
10.0000 mg | Freq: Once | INTRAMUSCULAR | Status: AC
  Administered 2017-05-01: 10 mg via INTRAVENOUS

## 2017-05-01 MED ORDER — SODIUM CHLORIDE 0.9 % IV SOLN
375.0000 mg/m2 | Freq: Once | INTRAVENOUS | Status: AC
  Administered 2017-05-01: 800 mg via INTRAVENOUS
  Filled 2017-05-01: qty 50

## 2017-05-01 MED ORDER — PALONOSETRON HCL INJECTION 0.25 MG/5ML
0.2500 mg | Freq: Once | INTRAVENOUS | Status: AC
Start: 2017-05-01 — End: 2017-05-01
  Administered 2017-05-01: 0.25 mg via INTRAVENOUS

## 2017-05-01 NOTE — Patient Instructions (Addendum)
[facility] Discharge Instructions for Patients Receiving Chemotherapy  Today you received the following chemotherapy agents: Rituxan and Bendeka  To help prevent nausea and vomiting after your treatment, we encourage you to take your nausea medication as directed.    If you develop nausea and vomiting that is not controlled by your nausea medication, call the clinic.   BELOW ARE SYMPTOMS THAT SHOULD BE REPORTED IMMEDIATELY:  *FEVER GREATER THAN 100.5 F  *CHILLS WITH OR WITHOUT FEVER  NAUSEA AND VOMITING THAT IS NOT CONTROLLED WITH YOUR NAUSEA MEDICATION  *UNUSUAL SHORTNESS OF BREATH  *UNUSUAL BRUISING OR BLEEDING  TENDERNESS IN MOUTH AND THROAT WITH OR WITHOUT PRESENCE OF ULCERS  *URINARY PROBLEMS  *BOWEL PROBLEMS  UNUSUAL RASH Items with * indicate a potential emergency and should be followed up as soon as possible.  Feel free to call the clinic you have any questions or concerns. The clinic phone number is [phone].  Please show the CHEMO ALERT CARD at check-in to the Emergency Department and triage nurse.   

## 2017-05-02 ENCOUNTER — Ambulatory Visit (HOSPITAL_BASED_OUTPATIENT_CLINIC_OR_DEPARTMENT_OTHER): Payer: Medicare Other

## 2017-05-02 VITALS — BP 128/73 | HR 61 | Temp 98.1°F | Resp 16

## 2017-05-02 DIAGNOSIS — C8318 Mantle cell lymphoma, lymph nodes of multiple sites: Secondary | ICD-10-CM

## 2017-05-02 DIAGNOSIS — Z5111 Encounter for antineoplastic chemotherapy: Secondary | ICD-10-CM

## 2017-05-02 MED ORDER — DEXAMETHASONE SODIUM PHOSPHATE 10 MG/ML IJ SOLN
INTRAMUSCULAR | Status: AC
  Filled 2017-05-02: qty 1

## 2017-05-02 MED ORDER — DEXAMETHASONE SODIUM PHOSPHATE 10 MG/ML IJ SOLN
10.0000 mg | Freq: Once | INTRAMUSCULAR | Status: AC
  Administered 2017-05-02: 10 mg via INTRAVENOUS

## 2017-05-02 MED ORDER — PEGFILGRASTIM 6 MG/0.6ML ~~LOC~~ PSKT
6.0000 mg | PREFILLED_SYRINGE | Freq: Once | SUBCUTANEOUS | Status: DC
  Filled 2017-05-02: qty 0.6

## 2017-05-02 MED ORDER — SODIUM CHLORIDE 0.9 % IV SOLN
Freq: Once | INTRAVENOUS | Status: AC
  Administered 2017-05-02: 09:00:00 via INTRAVENOUS

## 2017-05-02 MED ORDER — SODIUM CHLORIDE 0.9 % IV SOLN
70.0000 mg/m2 | Freq: Once | INTRAVENOUS | Status: AC
  Administered 2017-05-02: 150 mg via INTRAVENOUS
  Filled 2017-05-02: qty 6

## 2017-05-02 NOTE — Patient Instructions (Signed)
[facility] Discharge Instructions for Patients Receiving Chemotherapy  Today you received the following chemotherapy agents Bendeka.   To help prevent nausea and vomiting after your treatment, we encourage you to take your nausea medication as directed.    If you develop nausea and vomiting that is not controlled by your nausea medication, call the clinic.   BELOW ARE SYMPTOMS THAT SHOULD BE REPORTED IMMEDIATELY:  *FEVER GREATER THAN 100.5 F  *CHILLS WITH OR WITHOUT FEVER  NAUSEA AND VOMITING THAT IS NOT CONTROLLED WITH YOUR NAUSEA MEDICATION  *UNUSUAL SHORTNESS OF BREATH  *UNUSUAL BRUISING OR BLEEDING  TENDERNESS IN MOUTH AND THROAT WITH OR WITHOUT PRESENCE OF ULCERS  *URINARY PROBLEMS  *BOWEL PROBLEMS  UNUSUAL RASH Items with * indicate a potential emergency and should be followed up as soon as possible.  Feel free to call the clinic you have any questions or concerns. The clinic phone number is [phone].  Please show the CHEMO ALERT CARD at check-in to the Emergency Department and triage nurse.   

## 2017-05-03 ENCOUNTER — Ambulatory Visit (HOSPITAL_BASED_OUTPATIENT_CLINIC_OR_DEPARTMENT_OTHER): Payer: Medicare Other

## 2017-05-03 VITALS — BP 135/79 | HR 56 | Temp 97.9°F | Resp 17

## 2017-05-03 DIAGNOSIS — C8318 Mantle cell lymphoma, lymph nodes of multiple sites: Secondary | ICD-10-CM | POA: Diagnosis present

## 2017-05-03 DIAGNOSIS — Z5189 Encounter for other specified aftercare: Secondary | ICD-10-CM | POA: Diagnosis not present

## 2017-05-03 MED ORDER — PEGFILGRASTIM INJECTION 6 MG/0.6ML ~~LOC~~
6.0000 mg | PREFILLED_SYRINGE | Freq: Once | SUBCUTANEOUS | Status: AC
  Administered 2017-05-03: 6 mg via SUBCUTANEOUS

## 2017-05-10 ENCOUNTER — Other Ambulatory Visit: Payer: Self-pay | Admitting: Hematology

## 2017-05-13 ENCOUNTER — Other Ambulatory Visit: Payer: Self-pay

## 2017-05-13 MED ORDER — POTASSIUM CHLORIDE CRYS ER 20 MEQ PO TBCR: 20.0000 meq | EXTENDED_RELEASE_TABLET | Freq: Every day | ORAL | 0 refills | Status: DC

## 2017-05-18 ENCOUNTER — Other Ambulatory Visit: Payer: Self-pay | Admitting: Hematology

## 2017-05-18 DIAGNOSIS — C8318 Mantle cell lymphoma, lymph nodes of multiple sites: Secondary | ICD-10-CM

## 2017-05-19 ENCOUNTER — Other Ambulatory Visit: Payer: Self-pay

## 2017-05-22 ENCOUNTER — Other Ambulatory Visit: Payer: Self-pay

## 2017-05-22 ENCOUNTER — Telehealth: Payer: Self-pay

## 2017-05-22 NOTE — Telephone Encounter (Signed)
Left message with daughter to return call.

## 2017-05-22 NOTE — Telephone Encounter (Signed)
Scheduling message sent for 8/2 to continue treatment. Pt should have lab, doctor appt, and infusion appt. 8/3 infusion appt. 8/4 injection. Communicated expected changes to daughter. Unnecessary to come for appt 7/27 or 8/14. Daughter verbalized understanding. Will ask MD at next appt when to f/u with him regarding PET Scan results.

## 2017-05-23 ENCOUNTER — Ambulatory Visit: Payer: Medicare Other | Admitting: Hematology

## 2017-05-29 ENCOUNTER — Ambulatory Visit (HOSPITAL_BASED_OUTPATIENT_CLINIC_OR_DEPARTMENT_OTHER): Payer: Medicare Other | Admitting: Hematology

## 2017-05-29 ENCOUNTER — Other Ambulatory Visit (HOSPITAL_BASED_OUTPATIENT_CLINIC_OR_DEPARTMENT_OTHER): Payer: Medicare Other

## 2017-05-29 ENCOUNTER — Ambulatory Visit (HOSPITAL_BASED_OUTPATIENT_CLINIC_OR_DEPARTMENT_OTHER): Payer: Medicare Other

## 2017-05-29 VITALS — BP 152/69 | HR 68 | Temp 98.4°F | Resp 18 | Ht 71.0 in | Wt 181.0 lb

## 2017-05-29 VITALS — BP 151/72 | HR 60 | Temp 98.6°F | Resp 18

## 2017-05-29 DIAGNOSIS — Z5111 Encounter for antineoplastic chemotherapy: Secondary | ICD-10-CM | POA: Diagnosis not present

## 2017-05-29 DIAGNOSIS — C8318 Mantle cell lymphoma, lymph nodes of multiple sites: Secondary | ICD-10-CM | POA: Diagnosis not present

## 2017-05-29 DIAGNOSIS — M7989 Other specified soft tissue disorders: Secondary | ICD-10-CM | POA: Diagnosis not present

## 2017-05-29 DIAGNOSIS — Z5112 Encounter for antineoplastic immunotherapy: Secondary | ICD-10-CM | POA: Diagnosis present

## 2017-05-29 LAB — CBC WITH DIFFERENTIAL/PLATELET
BASO%: 1.3 % (ref 0.0–2.0)
Basophils Absolute: 0 10*3/uL (ref 0.0–0.1)
EOS ABS: 0.2 10*3/uL (ref 0.0–0.5)
EOS%: 7.1 % — ABNORMAL HIGH (ref 0.0–7.0)
HEMATOCRIT: 36.1 % — AB (ref 38.4–49.9)
HEMOGLOBIN: 11.8 g/dL — AB (ref 13.0–17.1)
LYMPH%: 35.5 % (ref 14.0–49.0)
MCH: 32.9 pg (ref 27.2–33.4)
MCHC: 32.7 g/dL (ref 32.0–36.0)
MCV: 100.6 fL — ABNORMAL HIGH (ref 79.3–98.0)
MONO#: 0.6 10*3/uL (ref 0.1–0.9)
MONO%: 18.1 % — ABNORMAL HIGH (ref 0.0–14.0)
NEUT%: 38 % — ABNORMAL LOW (ref 39.0–75.0)
NEUTROS ABS: 1.2 10*3/uL — AB (ref 1.5–6.5)
PLATELETS: 156 10*3/uL (ref 140–400)
RBC: 3.59 10*6/uL — ABNORMAL LOW (ref 4.20–5.82)
RDW: 16.2 % — AB (ref 11.0–14.6)
WBC: 3.1 10*3/uL — AB (ref 4.0–10.3)
lymph#: 1.1 10*3/uL (ref 0.9–3.3)

## 2017-05-29 LAB — COMPREHENSIVE METABOLIC PANEL
ALBUMIN: 3.1 g/dL — AB (ref 3.5–5.0)
ALT: 11 U/L (ref 0–55)
ANION GAP: 5 meq/L (ref 3–11)
AST: 21 U/L (ref 5–34)
Alkaline Phosphatase: 82 U/L (ref 40–150)
BILIRUBIN TOTAL: 0.56 mg/dL (ref 0.20–1.20)
BUN: 8.6 mg/dL (ref 7.0–26.0)
CALCIUM: 8.9 mg/dL (ref 8.4–10.4)
CO2: 28 mEq/L (ref 22–29)
CREATININE: 0.9 mg/dL (ref 0.7–1.3)
Chloride: 109 mEq/L (ref 98–109)
EGFR: 77 mL/min/{1.73_m2} — AB (ref >90)
GLUCOSE: 88 mg/dL (ref 70–140)
Potassium: 4 mEq/L (ref 3.5–5.1)
Sodium: 142 mEq/L (ref 136–145)
TOTAL PROTEIN: 5.4 g/dL — AB (ref 6.4–8.3)

## 2017-05-29 MED ORDER — SODIUM CHLORIDE 0.9 % IV SOLN
Freq: Once | INTRAVENOUS | Status: AC
  Administered 2017-05-29: 14:00:00 via INTRAVENOUS

## 2017-05-29 MED ORDER — ACETAMINOPHEN 325 MG PO TABS
ORAL_TABLET | ORAL | Status: AC
  Filled 2017-05-29: qty 2

## 2017-05-29 MED ORDER — SODIUM CHLORIDE 0.9 % IV SOLN
375.0000 mg/m2 | Freq: Once | INTRAVENOUS | Status: AC
  Administered 2017-05-29: 800 mg via INTRAVENOUS
  Filled 2017-05-29: qty 50

## 2017-05-29 MED ORDER — PALONOSETRON HCL INJECTION 0.25 MG/5ML
INTRAVENOUS | Status: AC
  Filled 2017-05-29: qty 5

## 2017-05-29 MED ORDER — DIPHENHYDRAMINE HCL 25 MG PO CAPS
ORAL_CAPSULE | ORAL | Status: AC
  Filled 2017-05-29: qty 2

## 2017-05-29 MED ORDER — DIPHENHYDRAMINE HCL 25 MG PO CAPS
50.0000 mg | ORAL_CAPSULE | Freq: Once | ORAL | Status: AC
  Administered 2017-05-29: 50 mg via ORAL

## 2017-05-29 MED ORDER — SODIUM CHLORIDE 0.9 % IV SOLN
70.0000 mg/m2 | Freq: Once | INTRAVENOUS | Status: AC
  Administered 2017-05-29: 150 mg via INTRAVENOUS
  Filled 2017-05-29: qty 6

## 2017-05-29 MED ORDER — DEXAMETHASONE SODIUM PHOSPHATE 10 MG/ML IJ SOLN
10.0000 mg | Freq: Once | INTRAMUSCULAR | Status: AC
  Administered 2017-05-29: 10 mg via INTRAVENOUS

## 2017-05-29 MED ORDER — PALONOSETRON HCL INJECTION 0.25 MG/5ML
0.2500 mg | Freq: Once | INTRAVENOUS | Status: AC
  Administered 2017-05-29: 0.25 mg via INTRAVENOUS

## 2017-05-29 MED ORDER — ACETAMINOPHEN 325 MG PO TABS
650.0000 mg | ORAL_TABLET | Freq: Once | ORAL | Status: AC
  Administered 2017-05-29: 650 mg via ORAL

## 2017-05-29 MED ORDER — DEXAMETHASONE SODIUM PHOSPHATE 10 MG/ML IJ SOLN
INTRAMUSCULAR | Status: AC
  Filled 2017-05-29: qty 1

## 2017-05-29 NOTE — Patient Instructions (Signed)
[facility] Discharge Instructions for Patients Receiving Chemotherapy  Today you received the following chemotherapy agents :  Rituxan,  Bendeka.  To help prevent nausea and vomiting after your treatment, we encourage you to take your nausea medication as prescribed.   If you develop nausea and vomiting that is not controlled by your nausea medication, call the clinic.   BELOW ARE SYMPTOMS THAT SHOULD BE REPORTED IMMEDIATELY:  *FEVER GREATER THAN 100.5 F  *CHILLS WITH OR WITHOUT FEVER  NAUSEA AND VOMITING THAT IS NOT CONTROLLED WITH YOUR NAUSEA MEDICATION  *UNUSUAL SHORTNESS OF BREATH  *UNUSUAL BRUISING OR BLEEDING  TENDERNESS IN MOUTH AND THROAT WITH OR WITHOUT PRESENCE OF ULCERS  *URINARY PROBLEMS  *BOWEL PROBLEMS  UNUSUAL RASH Items with * indicate a potential emergency and should be followed up as soon as possible.  Feel free to call the clinic you have any questions or concerns. The clinic phone number is [phone].  Please show the CHEMO ALERT CARD at check-in to the Emergency Department and triage nurse.   

## 2017-05-29 NOTE — Progress Notes (Signed)
Dr. Irene Limbo okay to tx with ANC 1.2.

## 2017-05-30 ENCOUNTER — Ambulatory Visit (HOSPITAL_BASED_OUTPATIENT_CLINIC_OR_DEPARTMENT_OTHER): Payer: Medicare Other

## 2017-05-30 ENCOUNTER — Telehealth: Payer: Self-pay | Admitting: Hematology

## 2017-05-30 VITALS — BP 135/73 | HR 63 | Temp 97.8°F | Resp 18

## 2017-05-30 DIAGNOSIS — Z5111 Encounter for antineoplastic chemotherapy: Secondary | ICD-10-CM

## 2017-05-30 DIAGNOSIS — C8318 Mantle cell lymphoma, lymph nodes of multiple sites: Secondary | ICD-10-CM

## 2017-05-30 MED ORDER — SODIUM CHLORIDE 0.9 % IV SOLN
70.0000 mg/m2 | Freq: Once | INTRAVENOUS | Status: AC
  Administered 2017-05-30: 150 mg via INTRAVENOUS
  Filled 2017-05-30: qty 6

## 2017-05-30 MED ORDER — DEXAMETHASONE SODIUM PHOSPHATE 10 MG/ML IJ SOLN
10.0000 mg | Freq: Once | INTRAMUSCULAR | Status: AC
  Administered 2017-05-30: 10 mg via INTRAVENOUS

## 2017-05-30 MED ORDER — DEXAMETHASONE SODIUM PHOSPHATE 10 MG/ML IJ SOLN
INTRAMUSCULAR | Status: AC
  Filled 2017-05-30: qty 1

## 2017-05-30 MED ORDER — SODIUM CHLORIDE 0.9 % IV SOLN
Freq: Once | INTRAVENOUS | Status: AC
  Administered 2017-05-30: 11:00:00 via INTRAVENOUS

## 2017-05-30 NOTE — Patient Instructions (Signed)
[facility] Discharge Instructions for Patients Receiving Chemotherapy  Today you received the following chemotherapy agents Bendeka.   To help prevent nausea and vomiting after your treatment, we encourage you to take your nausea medication as directed.    If you develop nausea and vomiting that is not controlled by your nausea medication, call the clinic.   BELOW ARE SYMPTOMS THAT SHOULD BE REPORTED IMMEDIATELY:  *FEVER GREATER THAN 100.5 F  *CHILLS WITH OR WITHOUT FEVER  NAUSEA AND VOMITING THAT IS NOT CONTROLLED WITH YOUR NAUSEA MEDICATION  *UNUSUAL SHORTNESS OF BREATH  *UNUSUAL BRUISING OR BLEEDING  TENDERNESS IN MOUTH AND THROAT WITH OR WITHOUT PRESENCE OF ULCERS  *URINARY PROBLEMS  *BOWEL PROBLEMS  UNUSUAL RASH Items with * indicate a potential emergency and should be followed up as soon as possible.  Feel free to call the clinic you have any questions or concerns. The clinic phone number is [phone].  Please show the CHEMO ALERT CARD at check-in to the Emergency Department and triage nurse.   

## 2017-05-30 NOTE — Telephone Encounter (Signed)
Tried to reach patient but was unable to reach him and leave a message.

## 2017-05-31 ENCOUNTER — Ambulatory Visit (HOSPITAL_BASED_OUTPATIENT_CLINIC_OR_DEPARTMENT_OTHER): Payer: Medicare Other

## 2017-05-31 ENCOUNTER — Other Ambulatory Visit: Payer: Self-pay | Admitting: Hematology

## 2017-05-31 VITALS — BP 140/72 | HR 57 | Temp 97.9°F | Resp 20

## 2017-05-31 DIAGNOSIS — C8318 Mantle cell lymphoma, lymph nodes of multiple sites: Secondary | ICD-10-CM | POA: Diagnosis present

## 2017-05-31 DIAGNOSIS — Z5189 Encounter for other specified aftercare: Secondary | ICD-10-CM

## 2017-05-31 MED ORDER — PEGFILGRASTIM INJECTION 6 MG/0.6ML ~~LOC~~
6.0000 mg | PREFILLED_SYRINGE | Freq: Once | SUBCUTANEOUS | Status: AC
  Administered 2017-05-31: 6 mg via SUBCUTANEOUS

## 2017-05-31 NOTE — Patient Instructions (Signed)
Pegfilgrastim injection What is this medicine? PEGFILGRASTIM (PEG fil gra stim) is a long-acting granulocyte colony-stimulating factor that stimulates the growth of neutrophils, a type of white blood cell important in the body's fight against infection. It is used to reduce the incidence of fever and infection in patients with certain types of cancer who are receiving chemotherapy that affects the bone marrow, and to increase survival after being exposed to high doses of radiation. This medicine may be used for other purposes; ask your health care provider or pharmacist if you have questions. COMMON BRAND NAME(S): Neulasta What should I tell my health care provider before I take this medicine? They need to know if you have any of these conditions: -kidney disease -latex allergy -ongoing radiation therapy -sickle cell disease -skin reactions to acrylic adhesives (On-Body Injector only) -an unusual or allergic reaction to pegfilgrastim, filgrastim, other medicines, foods, dyes, or preservatives -pregnant or trying to get pregnant -breast-feeding How should I use this medicine? This medicine is for injection under the skin. If you get this medicine at home, you will be taught how to prepare and give the pre-filled syringe or how to use the On-body Injector. Refer to the patient Instructions for Use for detailed instructions. Use exactly as directed. Tell your healthcare provider immediately if you suspect that the On-body Injector may not have performed as intended or if you suspect the use of the On-body Injector resulted in a missed or partial dose. It is important that you put your used needles and syringes in a special sharps container. Do not put them in a trash can. If you do not have a sharps container, call your pharmacist or healthcare provider to get one. Talk to your pediatrician regarding the use of this medicine in children. While this drug may be prescribed for selected conditions,  precautions do apply. Overdosage: If you think you have taken too much of this medicine contact a poison control center or emergency room at once. NOTE: This medicine is only for you. Do not share this medicine with others. What if I miss a dose? It is important not to miss your dose. Call your doctor or health care professional if you miss your dose. If you miss a dose due to an On-body Injector failure or leakage, a new dose should be administered as soon as possible using a single prefilled syringe for manual use. What may interact with this medicine? Interactions have not been studied. Give your health care provider a list of all the medicines, herbs, non-prescription drugs, or dietary supplements you use. Also tell them if you smoke, drink alcohol, or use illegal drugs. Some items may interact with your medicine. This list may not describe all possible interactions. Give your health care provider a list of all the medicines, herbs, non-prescription drugs, or dietary supplements you use. Also tell them if you smoke, drink alcohol, or use illegal drugs. Some items may interact with your medicine. What should I watch for while using this medicine? You may need blood work done while you are taking this medicine. If you are going to need a MRI, CT scan, or other procedure, tell your doctor that you are using this medicine (On-Body Injector only). What side effects may I notice from receiving this medicine? Side effects that you should report to your doctor or health care professional as soon as possible: -allergic reactions like skin rash, itching or hives, swelling of the face, lips, or tongue -dizziness -fever -pain, redness, or irritation at site   where injected -pinpoint red spots on the skin -red or dark-brown urine -shortness of breath or breathing problems -stomach or side pain, or pain at the shoulder -swelling -tiredness -trouble passing urine or change in the amount of urine Side  effects that usually do not require medical attention (report to your doctor or health care professional if they continue or are bothersome): -bone pain -muscle pain This list may not describe all possible side effects. Call your doctor for medical advice about side effects. You may report side effects to FDA at 1-800-FDA-1088. Where should I keep my medicine? Keep out of the reach of children. Store pre-filled syringes in a refrigerator between 2 and 8 degrees C (36 and 46 degrees F). Do not freeze. Keep in carton to protect from light. Throw away this medicine if it is left out of the refrigerator for more than 48 hours. Throw away any unused medicine after the expiration date. NOTE: This sheet is a summary. It may not cover all possible information. If you have questions about this medicine, talk to your doctor, pharmacist, or health care provider.  2018 Elsevier/Gold Standard (2016-10-10 12:58:03)  

## 2017-06-03 ENCOUNTER — Ambulatory Visit (HOSPITAL_COMMUNITY)
Admission: RE | Admit: 2017-06-03 | Discharge: 2017-06-03 | Disposition: A | Discharge status: Home / Self Care | Payer: Medicare Other | Source: Ambulatory Visit | Attending: Hematology | Admitting: Hematology

## 2017-06-03 ENCOUNTER — Telehealth: Payer: Self-pay | Admitting: Hematology

## 2017-06-03 DIAGNOSIS — R918 Other nonspecific abnormal finding of lung field: Secondary | ICD-10-CM | POA: Insufficient documentation

## 2017-06-03 DIAGNOSIS — I700 Atherosclerosis of aorta: Secondary | ICD-10-CM | POA: Insufficient documentation

## 2017-06-03 DIAGNOSIS — C8318 Mantle cell lymphoma, lymph nodes of multiple sites: Secondary | ICD-10-CM | POA: Diagnosis not present

## 2017-06-03 DIAGNOSIS — C8310 Mantle cell lymphoma, unspecified site: Secondary | ICD-10-CM | POA: Diagnosis not present

## 2017-06-03 DIAGNOSIS — J322 Chronic ethmoidal sinusitis: Secondary | ICD-10-CM | POA: Diagnosis not present

## 2017-06-03 DIAGNOSIS — I2510 Atherosclerotic heart disease of native coronary artery without angina pectoris: Secondary | ICD-10-CM | POA: Insufficient documentation

## 2017-06-03 LAB — GLUCOSE, CAPILLARY: GLUCOSE-CAPILLARY: 84 mg/dL (ref 65–99)

## 2017-06-03 MED ORDER — FLUDEOXYGLUCOSE F - 18 (FDG) INJECTION: 8.7400 | Freq: Once | INTRAVENOUS | Status: DC | PRN

## 2017-06-03 NOTE — Telephone Encounter (Signed)
Called and spoke with patient about his upcoming appointments in September.   Sending him a reminder letter and calendars.

## 2017-06-04 ENCOUNTER — Ambulatory Visit (HOSPITAL_BASED_OUTPATIENT_CLINIC_OR_DEPARTMENT_OTHER): Payer: Medicare Other | Admitting: Hematology

## 2017-06-04 ENCOUNTER — Encounter: Payer: Self-pay | Admitting: Hematology

## 2017-06-04 VITALS — BP 148/72 | HR 71 | Temp 98.1°F | Resp 18 | Ht 71.0 in | Wt 184.0 lb

## 2017-06-04 DIAGNOSIS — K069 Disorder of gingiva and edentulous alveolar ridge, unspecified: Secondary | ICD-10-CM

## 2017-06-04 DIAGNOSIS — K1379 Other lesions of oral mucosa: Secondary | ICD-10-CM | POA: Diagnosis not present

## 2017-06-04 DIAGNOSIS — C8318 Mantle cell lymphoma, lymph nodes of multiple sites: Secondary | ICD-10-CM

## 2017-06-04 DIAGNOSIS — K056 Periodontal disease, unspecified: Secondary | ICD-10-CM

## 2017-06-04 MED ORDER — LIDOCAINE-PRILOCAINE 2.5-2.5 % EX CREA: 1.0000 "application " | TOPICAL_CREAM | CUTANEOUS | 1 refills | Status: DC | PRN

## 2017-06-04 MED ORDER — CHLORHEXIDINE GLUCONATE 0.12 % MT SOLN: 10.0000 mL | Freq: Two times a day (BID) | OROMUCOSAL | 0 refills | Status: DC

## 2017-06-04 NOTE — Progress Notes (Signed)
[name]    HEMATOLOGY/ONCOLOGY Clinic NOTE  Date of Service: .05/29/2017  Patient Care Team: Eulas Post, MD as PCP - General (Family Medicine)  CHIEF COMPLAINTS/PURPOSE OF CONSULTATION:  F/u mantle cell lymphoma  HISTORY OF PRESENTING ILLNESS:   [name]. is a wonderful 83 y.o. male who is transferring care to me for continued evaluation and management of newly diagnosed Mantle cell lymphoma.  Patient is a very pleasant 83 year old with a history of hypertension, dyslipidemia, psoriasis, fibromyalgia, coronary artery disease status post PCI in 2009, GERD who presented with episodic rectal bleeding and weight loss of about 40 pounds since November 2017. His daughter who is a Marine scientist and is present at this visit notes that his weight has dropped from 216 down to 175 pounds. He has also been having bothersome diarrhea that has significantly affected his quality of life.  Patient was evaluated by GI and had a colonoscopy in 12/30/2016 in which his terminal ileum was moderately inflamed and ulcerated, biopsies were taken with cold forceps, 2 sessile polyps were found in the ascending colon, the rectosigmoid region was moderately inflamed, biopsies were taken exam otherwise was normal underdirect retroflexion views. A clinical diagnosis has been made as ileocolitis by the gastroenterologist  However pathologist has reported,small bowel mucosa with atypical lymphoid infiltrates and scattered active inflammation.  A right colon biopsy also showed colonic mucosa with atypical lymphoid infiltrates and scattered active inflammatory cells.  Surgical biopsy of the ascending colon polyp wasdocumented as tubular adenomawith the atypical lymphoid infiltrates.   Left colon biopsy also has revealed atypical lymphoid infiltrates with scattered inflammatory cells. Biopsy of the rectal mucosa has revealed atypical lymphoid infiltrates and active inflammatory cells without any  dysplasia.   Microscopic examination with special stains has revealed infiltrates positive for CD20 cells, positive for BCL 6 and BCL-2 with high Ki 67 at 50% however FISH studies showed no evidence of BCL 6 or BCL-2 disruption. Cells were noted to be cyclin D1 positive. [place] for t(11;14) was not noted to be negative however in tumor Board the pathologist noted that this was likely due to limited sampling. The consensus from pathology was this was consistent overall with a mantle cell lymphoma.  Patient subsequently had a bone marrow biopsy which appeared consistent with mantle cell lymphoma.  PET/CT scan was done on 01/30/2017 and showed scattered hypermetabolic lymphadenopathy in the left neck mediastinum and hilum and right lower quadrant of the abdomen. Possible right colon lesion.  Patient along with his daughter who is an Therapist, sports and his wife are here to discuss treatment options. Patient notes he is very fatigued and is losing weight and is hoping to get started as soon as possible on treatment.  Daughter notes that he has been functioning quite well prior to the diarrhea and weight loss and was able to function independently. Patient notes that the diarrhea is bothering him the most and he has a fair amount of urgency.  No issues with urination.  Notes some chills and night sweats. Notes that he is following with Dr. Ardis Hughs and was on Canasa and hydrocortisone enemas.  After his weight loss he notes his blood pressure has been running somewhat low and this has led to a significant decrease in his blood pressure medications.  We discussed treatment options in details and informed consent was obtained to proceed with bendamustine and Rituxan chemo-immunotherapy .  INTERVAL HISTORY  [name] is here for his scheduled f/u prior to his 4th cycle of BR  for mantle cell lymphoma. He notes no further concerns with diarrhea or GI bleeding. Has been eating well and has gained back some of his  lost weight. He notes his energy levels are better and he has been trying to mow the lawn and doing more physical activities. No fevers/chills/nightsweats. No CP/SOB. He feels ready to proceed with his 4th cycle of treatment. His PET scan isn't scheduled until 06/03/2017.  MEDICAL HISTORY:  Past Medical History:  Diagnosis Date  . Coronary artery disease    a. stent (promus) Cx/OM'09  . Fibromyalgia dx'd ~ 04/2015  . GERD (gastroesophageal reflux disease)   . Gout   . HEARING LOSS    "temporary"  . Hyperlipidemia   . HYPERLIPIDEMIA 10/13/2007  . Hypertension   . HYPERTENSION 10/13/2007  . Osteoarthritis    "left shoulder" (08/23/2015)  . Prostatitis, acute   . PROSTATITIS, ACUTE, HX OF 10/13/2007  . PSORIASIS 10/13/2007  . PVC (premature ventricular contraction)    hx  . SKIN CANCER, RECURRENT 10/13/2007    SURGICAL HISTORY: Past Surgical History:  Procedure Laterality Date  . APPENDECTOMY    . CORONARY ANGIOPLASTY WITH STENT PLACEMENT    . CYST EXCISION Right X 2   shoulder  . ELECTROLYSIS OF MISDIRECTED LASHES Bilateral    eyelashes are misdirected and grow inwards   . EYE SURGERY    . MASS EXCISION Right 01/2005   proximal thigh soft tissue mass  . TEAR DUCT PROBING Left   . TYMPANOPLASTY Bilateral    "for hearing loss; put tubes in also, 2X on right, 1X on the left; tubes worked"  . VASECTOMY      SOCIAL HISTORY: Social History   Social History  . Marital status: Married    Spouse name: N/A  . Number of children: 3  . Years of education: N/A   Occupational History  . retired Retired    from [employer].   Social History Main Topics  . Smoking status: Never Smoker  . Smokeless tobacco: Never Used  . Alcohol use No  . Drug use: No  . Sexual activity: No   Other Topics Concern  . Not on file   Social History Narrative   Married 1958   2 daughters- '59, 68, 1 son '61   8 grandchildren   Retired from [employer], works PT as a Curator. Now retired completely  (09/2008)   End of life; does not want heroic measures if in a persistent vegative state    FAMILY HISTORY: Family History  Problem Relation Age of Onset  . Heart attack Father 70       died  . Heart disease Father   . Parkinsonism Mother 83       died  . Breast cancer Sister        died  . Lung cancer Unknown        uncle died  . Colon cancer Neg Hx     ALLERGIES:  is allergic to niacin.  MEDICATIONS:  Current Outpatient Prescriptions  Medication Sig Dispense Refill  . carvedilol (COREG) 6.25 MG tablet Take 1 tablet (6.25 mg total) by mouth 2 (two) times daily. 60 tablet 11  . chlorhexidine (PERIDEX) 0.12 % solution Use as directed 10 mLs in the mouth or throat 2 (two) times daily. Swish and spit. Do not swallow 473 mL 0  . dexamethasone (DECADRON) 4 MG tablet Take 2 tablets (8 mg total) by mouth daily. Start the day after bendamustine chemotherapy for 2 days.  Take with food. 30 tablet 1  . fluticasone (FLONASE) 50 MCG/ACT nasal spray Place 1-2 sprays into both nostrils daily as needed for rhinitis.    [name] lidocaine-prilocaine (EMLA) cream Apply 1 application topically as needed. 30 g 1  . magic mouthwash SOLN Take 5 mLs by mouth 3 (three) times daily. 1 Part Viscous lidocaine 2% 1 Part Maalox 1 Part Nystatin 500,000 IU per 5 mL 200 mL 0  . NITROSTAT 0.4 MG SL tablet DISSOLVE 1 TABLET UNDER THE TONGUE EVERY 5 MINUTES AS NEEDED 25 tablet 3  . nystatin (MYCOSTATIN) 100000 UNIT/ML suspension Take 5 mLs (500,000 Units total) by mouth 3 (three) times daily. 60 mL 1  . ondansetron (ZOFRAN) 8 MG tablet Take 1 tablet (8 mg total) by mouth 2 (two) times daily as needed for refractory nausea / vomiting. Start on day 2 after bendamustine chemo. 30 tablet 1  . pantoprazole (PROTONIX) 40 MG tablet Take 1 tablet (40 mg total) by mouth daily. 30 tablet 3  . potassium chloride SA (K-DUR,KLOR-CON) 20 MEQ tablet Take 1 tablet (20 mEq total) by mouth daily. 30 tablet 0  . prochlorperazine  (COMPAZINE) 10 MG tablet Take 1 tablet (10 mg total) by mouth every 6 (six) hours as needed (Nausea or vomiting). 30 tablet 1  . rosuvastatin (CRESTOR) 10 MG tablet Take one tablet by mouth daily prior to bedtime (Patient taking differently: Take 10 mg by mouth at bedtime. ) 90 tablet 3   Current Facility-Administered Medications  Medication Dose Route Frequency Provider Last Rate Last Dose  . 0.9 %  sodium chloride infusion  500 mL Intravenous Continuous Milus Banister, MD       Facility-Administered Medications Ordered in Other Visits  Medication Dose Route Frequency Provider Last Rate Last Dose  . fludeoxyglucose F - 18 (FDG) injection 8.89 millicurie  1.69 millicurie Intravenous Once PRN Gaspar Cola, MD        REVIEW OF SYSTEMS:    10 Point review of Systems was done is negative except as noted above.  PHYSICAL EXAMINATION: ECOG PERFORMANCE STATUS: 2 - Symptomatic, <50% confined to bed  . Vitals:   05/29/17 1234  BP: (!) 152/69  Pulse: 68  Resp: 18  Temp: 98.4 F (36.9 C)   Filed Weights   05/29/17 1234  Weight: 181 lb (82.1 kg)   .Body mass index is 25.24 kg/m.  GENERAL:alert, in no acute distress and comfortable SKIN: no acute rashes, no significant lesions EYES: conjunctiva are pink and non-injected, sclera anicteric OROPHARYNX: MMM, no exudates, no oropharyngeal erythema or ulceration NECK: supple, no JVD LYMPH:  Small palpable lymph nodes in the left neck , no palpable axillary or inguinal lymphadenopathy  LUNGS: clear to auscultation b/l with normal respiratory effort HEART: regular rate & rhythm ABDOMEN:  normoactive bowel sounds , non tender, not distended. Extremity: 2+ pitting pedal edema bilaterally PSYCH: alert & oriented x 3 with fluent speech NEURO: no focal motor/sensory deficits  LABORATORY DATA:  I have reviewed the data as listed  . CBC Latest Ref Rng & Units 05/29/2017 05/01/2017 04/09/2017  WBC 4.0 - 10.3 10e3/uL 3.1(L) 4.8 18.6(H)    Hemoglobin 13.0 - 17.1 g/dL 11.8(L) 11.5(L) 11.4(L)  Hematocrit 38.4 - 49.9 % 36.1(L) 36.1(L) 36.2(L)  Platelets 140 - 400 10e3/uL 156 242 124(L)   . CMP Latest Ref Rng & Units 05/29/2017 05/01/2017 04/09/2017  Glucose 70 - 140 mg/dl 88 88 84  BUN 7.0 - 26.0 mg/dL 8.6 10.1 9.0  Creatinine 0.7 -  1.3 mg/dL 0.9 0.8 0.9  Sodium 136 - 145 mEq/L 142 142 141  Potassium 3.5 - 5.1 mEq/L 4.0 3.9 4.2  Chloride 101 - 111 mmol/L - - -  CO2 22 - 29 mEq/L '28 26 29  '$ Calcium 8.4 - 10.4 mg/dL 8.9 9.0 8.3(L)  Total Protein 6.4 - 8.3 g/dL 5.4(L) 5.2(L) 4.9(L)  Total Bilirubin 0.20 - 1.20 mg/dL 0.56 0.44 0.61  Alkaline Phos 40 - 150 U/L 82 77 144  AST 5 - 34 U/L '21 23 17  '$ ALT 0 - 55 U/L '11 10 15               '$ RADIOGRAPHIC STUDIES: I have personally reviewed the radiological images as listed and agreed with the findings in the report.  ASSESSMENT & PLAN:  83 year old male with  #1 Stage IVBE Mantle cell lymphoma He had hypermetabolic lymphadenopathy and extensive bowel involvement. Has significant constitutional symptoms including debilitating fatigue , weight loss of about 40 pounds since November 2017, anorexia, some subjective chills. Patient's constitutional symptoms have resolved.  #2 s/p persistent bothersome diarrhea and some rectal bleeding. Hemoglobin is stable at this time. This appears to be likely related to his mantle cell lymphoma. Could also have an underlying intermittent disorder. Is on Canasa and hydrocortisone suppositories as per GI. Patient's diarrhea has significantly improved. Only minimal hemorrhoidal bleeding at this time.  #3 moderate to severe protein calorie malnutrition with significant weight loss- due to lymphoma and diarrhea. Much improved nutritional status  . Wt Readings from Last 3 Encounters:  06/04/17 184 lb (83.5 kg)  05/29/17 181 lb (82.1 kg)  04/02/17 173 lb 9.6 oz (78.7 kg)    Plan -Labs stable.  He is clinically doing much better with  resolution of constitutional symptoms, much improved nutritional status and has gained back about 11 pounds since early June 2018. Performance status and energy levels have improved as well. -patient will proceed with4thcycle of BR  -continue Neulasta support due to limited BM reserve given patients age -compression socks for his leg swelling. -Encouraged him to maintain good nutrition, good fluid intake and walk at least 20-30 minutes a day. -PET/CT scheduled for 06/03/2017 . Orders Placed This Encounter  Procedures  . CBC & Diff and Retic    Standing Status:   Future    Standing Expiration Date:   05/29/2018  . Comprehensive metabolic panel    Standing Status:   Future    Standing Expiration Date:   05/29/2018  . Lactate dehydrogenase    Standing Status:   Future    Standing Expiration Date:   05/29/2018    F/u with PET/CT as planned on 8/7 Please schedule C5 and C6 of BR chemotherapy RTC with Dr Irene Limbo in 4 weeks with C5D1 of chemotherapy  All of the patients questions were answered with apparent satisfaction. The patient knows to call the clinic with any problems, questions or concerns.  I spent 20 minutes counseling the patient face to face. The total time spent in the appointment was 25 minutes and more than 50% was on counseling and direct patient cares.    Sullivan Lone MD [place] AAHIVMS [facility] Hematology/Oncology Physician [facility]  (Office):       [phone] (Work cell):  [phone] (Fax):           [phone]  06/04/2017 11:32 AM

## 2017-06-04 NOTE — Progress Notes (Signed)
[name]    HEMATOLOGY/ONCOLOGY Clinic NOTE  Date of Service: .06/04/2017  Patient Care Team: Eulas Post, MD as PCP - General (Family Medicine)  CHIEF COMPLAINTS/PURPOSE OF CONSULTATION:  F/u mantle cell lymphoma  HISTORY OF PRESENTING ILLNESS:   [name]. is a wonderful 83 y.o. male who is transferring care to me for continued evaluation and management of newly diagnosed Mantle cell lymphoma.  Patient is a very pleasant 83 year old with a history of hypertension, dyslipidemia, psoriasis, fibromyalgia, coronary artery disease status post PCI in 2009, GERD who presented with episodic rectal bleeding and weight loss of about 40 pounds since November 2017. His daughter who is a Marine scientist and is present at this visit notes that his weight has dropped from 216 down to 175 pounds. He has also been having bothersome diarrhea that has significantly affected his quality of life.  Patient was evaluated by GI and had a colonoscopy in 12/30/2016 in which his terminal ileum was moderately inflamed and ulcerated, biopsies were taken with cold forceps, 2 sessile polyps were found in the ascending colon, the rectosigmoid region was moderately inflamed, biopsies were taken exam otherwise was normal underdirect retroflexion views. A clinical diagnosis has been made as ileocolitis by the gastroenterologist  However pathologist has reported,small bowel mucosa with atypical lymphoid infiltrates and scattered active inflammation.  A right colon biopsy also showed colonic mucosa with atypical lymphoid infiltrates and scattered active inflammatory cells.  Surgical biopsy of the ascending colon polyp wasdocumented as tubular adenomawith the atypical lymphoid infiltrates.   Left colon biopsy also has revealed atypical lymphoid infiltrates with scattered inflammatory cells. Biopsy of the rectal mucosa has revealed atypical lymphoid infiltrates and active inflammatory cells without any  dysplasia.   Microscopic examination with special stains has revealed infiltrates positive for CD20 cells, positive for BCL 6 and BCL-2 with high Ki 67 at 50% however FISH studies showed no evidence of BCL 6 or BCL-2 disruption. Cells were noted to be cyclin D1 positive. [place] for t(11;14) was not noted to be negative however in tumor Board the pathologist noted that this was likely due to limited sampling. The consensus from pathology was this was consistent overall with a mantle cell lymphoma.  Patient subsequently had a bone marrow biopsy which appeared consistent with mantle cell lymphoma.  PET/CT scan was done on 01/30/2017 and showed scattered hypermetabolic lymphadenopathy in the left neck mediastinum and hilum and right lower quadrant of the abdomen. Possible right colon lesion.  Patient along with his daughter who is an Therapist, sports and his wife are here to discuss treatment options. Patient notes he is very fatigued and is losing weight and is hoping to get started as soon as possible on treatment.  Daughter notes that he has been functioning quite well prior to the diarrhea and weight loss and was able to function independently. Patient notes that the diarrhea is bothering him the most and he has a fair amount of urgency.  No issues with urination.  Notes some chills and night sweats. Notes that he is following with Dr. Ardis Hughs and was on Canasa and hydrocortisone enemas.  After his weight loss he notes his blood pressure has been running somewhat low and this has led to a significant decrease in his blood pressure medications.  We discussed treatment options in details and informed consent was obtained to proceed with bendamustine and Rituxan chemo-immunotherapy .  INTERVAL HISTORY  [name] is here for follow-up to discuss his PET scan results. We reviewed his  PET/CT images which show significant improvement in his mantle cell lymphoma compared to his previous pretreatment PET CT  scan. No evidence of progression. Patient overall feels well. Notes come pain and some discomfort likely from periodontal disease- was given Peridex mouthwash prescription. No diarrhea. Notes that it has been difficult at times to get an IV placed and wonders of that as a local anesthetic he can use. Given prescription for topical EMLA ointment.   MEDICAL HISTORY:  Past Medical History:  Diagnosis Date  . Coronary artery disease    a. stent (promus) Cx/OM'09  . Fibromyalgia dx'd ~ 04/2015  . GERD (gastroesophageal reflux disease)   . Gout   . HEARING LOSS    "temporary"  . Hyperlipidemia   . HYPERLIPIDEMIA 10/13/2007  . Hypertension   . HYPERTENSION 10/13/2007  . Osteoarthritis    "left shoulder" (08/23/2015)  . Prostatitis, acute   . PROSTATITIS, ACUTE, HX OF 10/13/2007  . PSORIASIS 10/13/2007  . PVC (premature ventricular contraction)    hx  . SKIN CANCER, RECURRENT 10/13/2007    SURGICAL HISTORY: Past Surgical History:  Procedure Laterality Date  . APPENDECTOMY    . CORONARY ANGIOPLASTY WITH STENT PLACEMENT    . CYST EXCISION Right X 2   shoulder  . ELECTROLYSIS OF MISDIRECTED LASHES Bilateral    eyelashes are misdirected and grow inwards   . EYE SURGERY    . MASS EXCISION Right 01/2005   proximal thigh soft tissue mass  . TEAR DUCT PROBING Left   . TYMPANOPLASTY Bilateral    "for hearing loss; put tubes in also, 2X on right, 1X on the left; tubes worked"  . VASECTOMY      SOCIAL HISTORY: Social History   Social History  . Marital status: Married    Spouse name: N/A  . Number of children: 3  . Years of education: N/A   Occupational History  . retired Retired    from [employer].   Social History Main Topics  . Smoking status: Never Smoker  . Smokeless tobacco: Never Used  . Alcohol use No  . Drug use: No  . Sexual activity: No   Other Topics Concern  . Not on file   Social History Narrative   Married 1958   2 daughters- '59, 68, 1 son '61   8  grandchildren   Retired from [employer], works PT as a Curator. Now retired completely (09/2008)   End of life; does not want heroic measures if in a persistent vegative state    FAMILY HISTORY: Family History  Problem Relation Age of Onset  . Heart attack Father 47       died  . Heart disease Father   . Parkinsonism Mother 15       died  . Breast cancer Sister        died  . Lung cancer Unknown        uncle died  . Colon cancer Neg Hx     ALLERGIES:  is allergic to niacin.  MEDICATIONS:  Current Outpatient Prescriptions  Medication Sig Dispense Refill  . carvedilol (COREG) 6.25 MG tablet Take 1 tablet (6.25 mg total) by mouth 2 (two) times daily. 60 tablet 11  . dexamethasone (DECADRON) 4 MG tablet Take 2 tablets (8 mg total) by mouth daily. Start the day after bendamustine chemotherapy for 2 days. Take with food. 30 tablet 1  . fluticasone (FLONASE) 50 MCG/ACT nasal spray Place 1-2 sprays into both nostrils daily as needed for rhinitis.    [name]  magic mouthwash SOLN Take 5 mLs by mouth 3 (three) times daily. 1 Part Viscous lidocaine 2% 1 Part Maalox 1 Part Nystatin 500,000 IU per 5 mL 200 mL 0  . NITROSTAT 0.4 MG SL tablet DISSOLVE 1 TABLET UNDER THE TONGUE EVERY 5 MINUTES AS NEEDED 25 tablet 3  . nystatin (MYCOSTATIN) 100000 UNIT/ML suspension Take 5 mLs (500,000 Units total) by mouth 3 (three) times daily. 60 mL 1  . ondansetron (ZOFRAN) 8 MG tablet Take 1 tablet (8 mg total) by mouth 2 (two) times daily as needed for refractory nausea / vomiting. Start on day 2 after bendamustine chemo. 30 tablet 1  . pantoprazole (PROTONIX) 40 MG tablet Take 1 tablet (40 mg total) by mouth daily. 30 tablet 3  . potassium chloride SA (K-DUR,KLOR-CON) 20 MEQ tablet Take 1 tablet (20 mEq total) by mouth daily. 30 tablet 0  . prochlorperazine (COMPAZINE) 10 MG tablet Take 1 tablet (10 mg total) by mouth every 6 (six) hours as needed (Nausea or vomiting). 30 tablet 1  . rosuvastatin (CRESTOR) 10 MG tablet  Take one tablet by mouth daily prior to bedtime (Patient taking differently: Take 10 mg by mouth at bedtime. ) 90 tablet 3  . chlorhexidine (PERIDEX) 0.12 % solution Use as directed 10 mLs in the mouth or throat 2 (two) times daily. Swish and spit. Do not swallow 473 mL 0  . lidocaine-prilocaine (EMLA) cream Apply 1 application topically as needed. 30 g 1   Current Facility-Administered Medications  Medication Dose Route Frequency Provider Last Rate Last Dose  . 0.9 %  sodium chloride infusion  500 mL Intravenous Continuous Rachael Fee, MD       Facility-Administered Medications Ordered in Other Visits  Medication Dose Route Frequency Provider Last Rate Last Dose  . fludeoxyglucose F - 18 (FDG) injection 8.74 millicurie  8.74 millicurie Intravenous Once PRN Princella Pellegrini, MD        REVIEW OF SYSTEMS:    10 Point review of Systems was done is negative except as noted above.  PHYSICAL EXAMINATION: ECOG PERFORMANCE STATUS: 2 - Symptomatic, <50% confined to bed  . Vitals:   06/04/17 1051  BP: (!) 148/72  Pulse: 71  Resp: 18  Temp: 98.1 F (36.7 C)   Filed Weights   06/04/17 1051  Weight: 184 lb (83.5 kg)   .Body mass index is 25.66 kg/m.  GENERAL:alert, in no acute distress and comfortable SKIN: no acute rashes, no significant lesions EYES: conjunctiva are pink and non-injected, sclera anicteric OROPHARYNX: MMM, no exudates, no oropharyngeal erythema or ulceration NECK: supple, no JVD LYMPH:  Small palpable lymph nodes in the left neck , no palpable axillary or inguinal lymphadenopathy  LUNGS: clear to auscultation b/l with normal respiratory effort HEART: regular rate & rhythm ABDOMEN:  normoactive bowel sounds , non tender, not distended. Extremity: 2+ pitting pedal edema bilaterally PSYCH: alert & oriented x 3 with fluent speech NEURO: no focal motor/sensory deficits  LABORATORY DATA:  I have reviewed the data as listed  . CBC Latest Ref Rng & Units 05/29/2017  05/01/2017 04/09/2017  WBC 4.0 - 10.3 10e3/uL 3.1(L) 4.8 18.6(H)  Hemoglobin 13.0 - 17.1 g/dL 11.8(L) 11.5(L) 11.4(L)  Hematocrit 38.4 - 49.9 % 36.1(L) 36.1(L) 36.2(L)  Platelets 140 - 400 10e3/uL 156 242 124(L)   . CMP Latest Ref Rng & Units 05/29/2017 05/01/2017 04/09/2017  Glucose 70 - 140 mg/dl 88 88 84  BUN 7.0 - 18.5 mg/dL 8.6 99.5 9.0  Creatinine 0.7 -  1.3 mg/dL 0.9 0.8 0.9  Sodium 136 - 145 mEq/L 142 142 141  Potassium 3.5 - 5.1 mEq/L 4.0 3.9 4.2  Chloride 101 - 111 mmol/L - - -  CO2 22 - 29 mEq/L '28 26 29  '$ Calcium 8.4 - 10.4 mg/dL 8.9 9.0 8.3(L)  Total Protein 6.4 - 8.3 g/dL 5.4(L) 5.2(L) 4.9(L)  Total Bilirubin 0.20 - 1.20 mg/dL 0.56 0.44 0.61  Alkaline Phos 40 - 150 U/L 82 77 144  AST 5 - 34 U/L '21 23 17  '$ ALT 0 - 55 U/L '11 10 15               '$ RADIOGRAPHIC STUDIES: I have personally reviewed the radiological images as listed and agreed with the findings in the report.  .Nm Pet Image Restag (ps) Skull Base To Thigh  Result Date: 06/03/2017 CLINICAL DATA:  Subsequent treatment strategy for mantle cell lymphoma. EXAM: NUCLEAR MEDICINE PET SKULL BASE TO THIGH TECHNIQUE: 8.7 mCi F-18 FDG was injected intravenously. Full-ring PET imaging was performed from the skull base to thigh after the radiotracer. CT data was obtained and used for attenuation correction and anatomic localization. FASTING BLOOD GLUCOSE:  Value: 84 is mg/dl COMPARISON:  Prior PET-CT of 01/30/2017 FINDINGS: NECK Previous hypermetabolic left neck lymph nodes have resolved. There is some physiologic muscular activity in the neck. Small right mastoid effusion. Mild chronic ethmoid sinusitis. CHEST Any of the previous mediastinal and hilar lymph nodes have resolved. There is some faint residual activity in the right hilum with maximum SUV in this vicinity 3.7, formerly 4.7. Subcarinal node measures 1.0 cm in short axis, formerly 1.2 cm, with maximum SUV of 2.3, formerly 5.6. Hypermetabolic activity in the  paratracheal, AP window, and left hilar nodes has resolved. No new nodal activity in the chest. Posterior pleural thickening without significant hypermetabolic activity currently background mediastinal blood pool activity SUV is 2.1. Coronary atherosclerosis. ABDOMEN/PELVIS There continues to be some mildly accentuated bilateral adrenal activity diffusely common not appreciably changed from prior exam, an without associated mass. Non rotated left kidney. Aortoiliac atherosclerotic vascular disease. The liver, spleen, and pancreas appear unremarkable. No hypermetabolic adenopathy. In the vicinity of the prior ascending colon focus of accentuated metabolic activity there is no current abnormal hypermetabolic activity. The hypermetabolic right lower quadrant mesenteric lymph node shown on the prior exam seems to have resolved. Prominent stool throughout the colon favors constipation. There is some mild distal rectal wall thickening. Small amount of free pelvic fluid. Background hepatic activity 3.4. SKELETON Diffuse accentuated metabolic activity throughout the skeleton without CT correlate. Facet arthropathy in the lower lumbar spine. IMPRESSION: 1. Resolution of prior nodal activity in the neck and abdomen, with resolution of some of the previous lymph nodes in the chest, and only very low-grade activity reduced size remaining thoracic lymph nodes. Overall appearance is of considerable improvement. 2. Diffuse skeletal activity, likely from granulocyte stimulation. 3. Other imaging findings of potential clinical significance: Small right mastoid effusion. Chronic ethmoid sinusitis. Aortic Atherosclerosis (ICD10-I70.0). Coronary atherosclerosis. Mild posterior pleural thickening without hypermetabolic activity. Non rotated left kidney. Small amount of free pelvic fluid. Electronically Signed   By: Van Clines M.D.   On: 06/03/2017 13:59     ASSESSMENT & PLAN:  83 year old male with  #1 Stage IVBE Mantle  cell lymphoma He had hypermetabolic lymphadenopathy and extensive bowel involvement. Has significant constitutional symptoms including debilitating fatigue , weight loss of about 40 pounds since November 2017, anorexia, some subjective chills. Patient's constitutional symptoms have resolved.  #  2 s/p persistent bothersome diarrhea and some rectal bleeding. Hemoglobin is stable at this time. This appears to be likely related to his mantle cell lymphoma. Could also have an underlying intermittent disorder. Is on Canasa and hydrocortisone suppositories as per GI. Patient's diarrhea has significantly improved. Only minimal hemorrhoidal bleeding at this time.  #3 moderate to severe protein calorie malnutrition with significant weight loss- due to lymphoma and diarrhea. Much improved nutritional status  . Wt Readings from Last 3 Encounters:  06/04/17 184 lb (83.5 kg)  05/29/17 181 lb (82.1 kg)  04/02/17 173 lb 9.6 oz (78.7 kg)    Plan -That CT scans were reviewed in detail with the patient and his family including showing them the  Before and after PET/CT images . PET/CT showed Resolution of prior nodal activity in the neck and abdomen, with resolution of some of the previous lymph nodes in the chest, and only very low-grade activity reduced size remaining thoracic lymph nodes. Overall appearance is of considerable improvement. -Given prescription for Peridex for periodontal disease. -Given prescription for EMLA ointment for topical application to reduce pain from IV lines.  RTC with Dr Irene Limbo C5D1 with labs Schedule C5 and C6 of chemotherapy  All of the patients questions were answered with apparent satisfaction. The patient knows to call the clinic with any problems, questions or concerns.  I spent 20 minutes counseling the patient face to face. The total time spent in the appointment was 20 minutes and more than 50% was on counseling and direct patient cares.    Sullivan Lone MD [place] AAHIVMS [facility]  [facility] Hematology/Oncology Physician [facility]  (Office):       [phone] (Work cell):  [phone] (Fax):           [phone]  06/04/2017 12:20 PM

## 2017-06-04 NOTE — Patient Instructions (Signed)
Thank you for choosing [facility] to provide your oncology and hematology care.  To afford each patient quality time with our providers, please arrive 30 minutes before your scheduled appointment time.  If you arrive late for your appointment, you may be asked to reschedule.  We strive to give you quality time with our providers, and arriving late affects you and other patients whose appointments are after yours.   If you are a no show for multiple scheduled visits, you may be dismissed from the clinic at the providers discretion.    Again, thank you for choosing [facility], our hope is that these requests will decrease the amount of time that you wait before being seen by our physicians.  ______________________________________________________________________  Should you have questions after your visit to the [facility], please contact our office at [phone] between the hours of 8:30 and 4:30 p.m.    Voicemails left after 4:30p.m will not be returned until the following business day.    For prescription refill requests, please have your pharmacy contact us directly.  Please also try to allow 48 hours for prescription requests.    Please contact the scheduling department for questions regarding scheduling.  For scheduling of procedures such as PET scans, CT scans, MRI, Ultrasound, etc please contact central scheduling at [phone].    Resources For Cancer Patients and Caregivers:   Oncolink.org:  A wonderful resource for patients and healthcare providers for information regarding your disease, ways to tract your treatment, what to expect, etc.     American Cancer Society:  800-227-2345  Can help patients locate various types of support and financial assistance  Cancer Care: 1-800-813-HOPE (4673) Provides financial assistance, online support groups, medication/co-pay assistance.    [place] DSS:  336-641-3447 Where to apply for food  stamps, Medicaid, and utility assistance  Medicare Rights Center: 800-333-4114 Helps people with Medicare understand their rights and benefits, navigate the Medicare system, and secure the quality healthcare they deserve  SCAT: 336-333-6589 [place] [employer]'s shared-ride transportation service for eligible riders who have a disability that prevents them from riding the fixed route bus.    For additional information on assistance programs please contact our social worker:   Grier Hock/Abigail Elmore:  [phone]            

## 2017-06-10 ENCOUNTER — Ambulatory Visit: Payer: Medicare Other | Admitting: Hematology

## 2017-06-12 ENCOUNTER — Other Ambulatory Visit: Payer: Self-pay | Admitting: Hematology

## 2017-06-12 ENCOUNTER — Other Ambulatory Visit: Payer: Self-pay

## 2017-06-12 MED ORDER — POTASSIUM CHLORIDE CRYS ER 20 MEQ PO TBCR: 20.0000 meq | EXTENDED_RELEASE_TABLET | Freq: Every day | ORAL | 0 refills | Status: DC

## 2017-06-15 NOTE — Progress Notes (Signed)
Cardiology Office Note   Date:  06/16/2017   ID:  Jacob Bowens., DOB [birth year], MRN [id number]  PCP:  Eulas Post, MD  Cardiologist:   Dorris Carnes, MD    F/U of CAD     History of Present Illness: Jacob Drumwright. is a 83 y.o. male with a history of  CAD (s/p PROMUS stent to LCx in 2009) Also a history of PVCs, HTN, HL He was admitted Srping 2016 weakness. R/O for MI Did have a low grade fever Echo done LVEF was 40 to 45% with diffuse hypokinesis, worse in the inferolateral wall    I saw the pt in Sept  2017 He was diagnosed with lymphoma ealier this year Seen by Kathleen Argue in April  Had some LE edema  Scan neg for DVT  BP low and lisinopril decreased  Breathing OK   Gets tired Not like before  Deneis CP    Tr edema  Eats some salt    Outpatient Medications Prior to Visit  Medication Sig Dispense Refill  . carvedilol (COREG) 6.25 MG tablet Take 1 tablet (6.25 mg total) by mouth 2 (two) times daily. 60 tablet 11  . NITROSTAT 0.4 MG SL tablet DISSOLVE 1 TABLET UNDER THE TONGUE EVERY 5 MINUTES AS NEEDED 25 tablet 3  . pantoprazole (PROTONIX) 40 MG tablet Take 1 tablet (40 mg total) by mouth daily. 30 tablet 3  . potassium chloride SA (K-DUR,KLOR-CON) 20 MEQ tablet Take 1 tablet (20 mEq total) by mouth daily. 30 tablet 0  . rosuvastatin (CRESTOR) 10 MG tablet Take one tablet by mouth daily prior to bedtime (Patient taking differently: Take 10 mg by mouth at bedtime. ) 90 tablet 3  . chlorhexidine (PERIDEX) 0.12 % solution Use as directed 10 mLs in the mouth or throat 2 (two) times daily. Swish and spit. Do not swallow 473 mL 0  . dexamethasone (DECADRON) 4 MG tablet Take 2 tablets (8 mg total) by mouth daily. Start the day after bendamustine chemotherapy for 2 days. Take with food. 30 tablet 1  . fluticasone (FLONASE) 50 MCG/ACT nasal spray Place 1-2 sprays into both nostrils daily as needed for rhinitis.    [name] lidocaine-prilocaine (EMLA) cream Apply 1  application topically as needed. 30 g 1  . magic mouthwash SOLN Take 5 mLs by mouth 3 (three) times daily. 1 Part Viscous lidocaine 2% 1 Part Maalox 1 Part Nystatin 500,000 IU per 5 mL 200 mL 0  . nystatin (MYCOSTATIN) 100000 UNIT/ML suspension Take 5 mLs (500,000 Units total) by mouth 3 (three) times daily. 60 mL 1  . ondansetron (ZOFRAN) 8 MG tablet Take 1 tablet (8 mg total) by mouth 2 (two) times daily as needed for refractory nausea / vomiting. Start on day 2 after bendamustine chemo. 30 tablet 1  . prochlorperazine (COMPAZINE) 10 MG tablet Take 1 tablet (10 mg total) by mouth every 6 (six) hours as needed (Nausea or vomiting). (Patient not taking: Reported on 06/16/2017) 30 tablet 1   Facility-Administered Medications Prior to Visit  Medication Dose Route Frequency Provider Last Rate Last Dose  . 0.9 %  sodium chloride infusion  500 mL Intravenous Continuous Milus Banister, MD         Allergies:   Niacin   Past Medical History:  Diagnosis Date  . Coronary artery disease    a. stent (promus) Cx/OM'09  . Fibromyalgia dx'd ~ 04/2015  . GERD (gastroesophageal reflux disease)   . Gout   .  HEARING LOSS    "temporary"  . Hyperlipidemia   . HYPERLIPIDEMIA 10/13/2007  . Hypertension   . HYPERTENSION 10/13/2007  . Osteoarthritis    "left shoulder" (08/23/2015)  . Prostatitis, acute   . PROSTATITIS, ACUTE, HX OF 10/13/2007  . PSORIASIS 10/13/2007  . PVC (premature ventricular contraction)    hx  . SKIN CANCER, RECURRENT 10/13/2007    Past Surgical History:  Procedure Laterality Date  . APPENDECTOMY    . CORONARY ANGIOPLASTY WITH STENT PLACEMENT    . CYST EXCISION Right X 2   shoulder  . ELECTROLYSIS OF MISDIRECTED LASHES Bilateral    eyelashes are misdirected and grow inwards   . EYE SURGERY    . MASS EXCISION Right 01/2005   proximal thigh soft tissue mass  . TEAR DUCT PROBING Left   . TYMPANOPLASTY Bilateral    "for hearing loss; put tubes in also, 2X on right, 1X on  the left; tubes worked"  . VASECTOMY       Social History:  The patient  reports that he has never smoked. He has never used smokeless tobacco. He reports that he does not drink alcohol or use drugs.   Family History:  The patient's family history includes Breast cancer in his sister; Heart attack (age of onset: 13) in his father; Heart disease in his father; Lung cancer in his unknown relative; Parkinsonism (age of onset: 79) in his mother.    ROS:  Please see the history of present illness. All other systems are reviewed and  Negative to the above problem except as noted.    PHYSICAL EXAM: VS:  BP 128/70   Pulse 76   Ht 5\' 11"  (1.803 m)   Wt 180 lb 3.2 oz (81.7 kg)   SpO2 96%   BMI 25.13 kg/m   GEN: Well nourished, well developed, in no acute distress  HEENT: normal  Neck: no JVD, carotid bruits, or masses Cardiac: RRR; no murmurs, rubs, or gallops,Tr to 1+  edema  Respiratory:  clear to auscultation bilaterally, normal work of breathing GI: soft, nontender, nondistended, + BS  No hepatomegaly  MS: no deformity Moving all extremities   Skin: warm and dry, no rash Neuro:  Strength and sensation are intact Psych: euthymic mood, full affect   EKG:  EKG is not ordered today.      Lipid Panel    Component Value Date/Time   CHOL 141 01/22/2016 0952   TRIG 149 01/22/2016 0952   HDL 46 01/22/2016 0952   CHOLHDL 3.1 01/22/2016 0952   VLDL 30 01/22/2016 0952   LDLCALC 65 01/22/2016 0952   LDLDIRECT 128.0 11/16/2007 1454      Wt Readings from Last 3 Encounters:  06/16/17 180 lb 3.2 oz (81.7 kg)  06/04/17 184 lb (83.5 kg)  05/29/17 181 lb (82.1 kg)      ASSESSMENT AND PLAN:  1  CAD  No sympotms of angina  I think fatigue is prob from chemo Rx Volume status overall appears OK    2HTN  BP is OK on current medical regimen    3  HL    Continue Crestor     Current medicines are reviewed at length with the patient today.  The patient does not have concerns regarding  medicines.   Disposition:   FU with  in  April 2019    Signed, Dorris Carnes, MD  06/16/2017 10:46 AM    [place], [place]  [address] Phone: [phone]; Fax: [phone]

## 2017-06-16 ENCOUNTER — Encounter: Payer: Self-pay | Admitting: Internal Medicine

## 2017-06-16 ENCOUNTER — Ambulatory Visit (INDEPENDENT_AMBULATORY_CARE_PROVIDER_SITE_OTHER): Payer: Medicare Other | Admitting: Internal Medicine

## 2017-06-16 VITALS — BP 128/70 | HR 76 | Ht 71.0 in | Wt 180.2 lb

## 2017-06-16 DIAGNOSIS — I10 Essential (primary) hypertension: Secondary | ICD-10-CM

## 2017-06-16 DIAGNOSIS — I2510 Atherosclerotic heart disease of native coronary artery without angina pectoris: Secondary | ICD-10-CM | POA: Diagnosis not present

## 2017-06-16 DIAGNOSIS — E782 Mixed hyperlipidemia: Secondary | ICD-10-CM

## 2017-06-16 NOTE — Patient Instructions (Addendum)
Your physician recommends that you continue on your current medications as directed. Please refer to the Current Medication list given to you today.  Your physician wants you to follow-up in: April, 2019 with Dr. Ross.  You will receive a reminder letter in the mail two months in advance. If you don't receive a letter, please call our office to schedule the follow-up appointment.  

## 2017-06-26 ENCOUNTER — Encounter: Payer: Self-pay | Admitting: Hematology

## 2017-06-26 ENCOUNTER — Telehealth: Payer: Self-pay | Admitting: Hematology

## 2017-06-26 ENCOUNTER — Other Ambulatory Visit (HOSPITAL_BASED_OUTPATIENT_CLINIC_OR_DEPARTMENT_OTHER): Payer: Medicare Other

## 2017-06-26 ENCOUNTER — Ambulatory Visit (HOSPITAL_BASED_OUTPATIENT_CLINIC_OR_DEPARTMENT_OTHER): Payer: Medicare Other | Admitting: Hematology

## 2017-06-26 ENCOUNTER — Ambulatory Visit: Payer: Medicare Other

## 2017-06-26 VITALS — BP 144/68 | HR 70 | Temp 98.3°F | Resp 17 | Ht 71.0 in | Wt 181.4 lb

## 2017-06-26 DIAGNOSIS — D701 Agranulocytosis secondary to cancer chemotherapy: Secondary | ICD-10-CM

## 2017-06-26 DIAGNOSIS — C8318 Mantle cell lymphoma, lymph nodes of multiple sites: Secondary | ICD-10-CM | POA: Diagnosis not present

## 2017-06-26 DIAGNOSIS — D702 Other drug-induced agranulocytosis: Secondary | ICD-10-CM

## 2017-06-26 DIAGNOSIS — I2510 Atherosclerotic heart disease of native coronary artery without angina pectoris: Secondary | ICD-10-CM | POA: Diagnosis not present

## 2017-06-26 LAB — CBC & DIFF AND RETIC
BASO%: 1.6 % (ref 0.0–2.0)
BASOS ABS: 0.1 10*3/uL (ref 0.0–0.1)
EOS ABS: 0.2 10*3/uL (ref 0.0–0.5)
EOS%: 4.4 % (ref 0.0–7.0)
HEMATOCRIT: 37.3 % — AB (ref 38.4–49.9)
HEMOGLOBIN: 12.1 g/dL — AB (ref 13.0–17.1)
Immature Retic Fract: 3.2 % (ref 3.00–10.60)
LYMPH#: 2.7 10*3/uL (ref 0.9–3.3)
LYMPH%: 62 % — ABNORMAL HIGH (ref 14.0–49.0)
MCH: 32.9 pg (ref 27.2–33.4)
MCHC: 32.4 g/dL (ref 32.0–36.0)
MCV: 101.4 fL — ABNORMAL HIGH (ref 79.3–98.0)
MONO#: 0.7 10*3/uL (ref 0.1–0.9)
MONO%: 15.9 % — ABNORMAL HIGH (ref 0.0–14.0)
NEUT#: 0.7 10*3/uL — ABNORMAL LOW (ref 1.5–6.5)
NEUT%: 16.1 % — ABNORMAL LOW (ref 39.0–75.0)
Platelets: 167 10*3/uL (ref 140–400)
RBC: 3.68 10*6/uL — ABNORMAL LOW (ref 4.20–5.82)
RDW: 14.7 % — AB (ref 11.0–14.6)
RETIC %: 0.88 % (ref 0.80–1.80)
RETIC CT ABS: 32.38 10*3/uL — AB (ref 34.80–93.90)
WBC: 4.3 10*3/uL (ref 4.0–10.3)

## 2017-06-26 LAB — COMPREHENSIVE METABOLIC PANEL
ALBUMIN: 3 g/dL — AB (ref 3.5–5.0)
ALK PHOS: 77 U/L (ref 40–150)
ALT: 13 U/L (ref 0–55)
AST: 20 U/L (ref 5–34)
Anion Gap: 5 mEq/L (ref 3–11)
BUN: 12.6 mg/dL (ref 7.0–26.0)
CALCIUM: 9 mg/dL (ref 8.4–10.4)
CHLORIDE: 110 meq/L — AB (ref 98–109)
CO2: 27 mEq/L (ref 22–29)
CREATININE: 0.9 mg/dL (ref 0.7–1.3)
EGFR: 79 mL/min/{1.73_m2} — ABNORMAL LOW (ref >90)
GLUCOSE: 91 mg/dL (ref 70–140)
Potassium: 4.3 mEq/L (ref 3.5–5.1)
Sodium: 142 mEq/L (ref 136–145)
Total Bilirubin: 0.4 mg/dL (ref 0.20–1.20)
Total Protein: 5.4 g/dL — ABNORMAL LOW (ref 6.4–8.3)

## 2017-06-26 LAB — LACTATE DEHYDROGENASE: LDH: 233 U/L (ref 125–245)

## 2017-06-26 MED ORDER — TBO-FILGRASTIM 300 MCG/0.5ML ~~LOC~~ SOSY
300.0000 ug | PREFILLED_SYRINGE | Freq: Once | SUBCUTANEOUS | Status: AC
  Administered 2017-06-26: 300 ug via SUBCUTANEOUS
  Filled 2017-06-26: qty 0.5

## 2017-06-26 NOTE — Patient Instructions (Addendum)
Thank you for choosing [facility] to provide your oncology and hematology care.  To afford each patient quality time with our providers, please arrive 30 minutes before your scheduled appointment time.  If you arrive late for your appointment, you may be asked to reschedule.  We strive to give you quality time with our providers, and arriving late affects you and other patients whose appointments are after yours.   If you are a no show for multiple scheduled visits, you may be dismissed from the clinic at the providers discretion.    Again, thank you for choosing [facility], our hope is that these requests will decrease the amount of time that you wait before being seen by our physicians.  ______________________________________________________________________  Should you have questions after your visit to the [facility], please contact our office at [phone] between the hours of 8:30 and 4:30 p.m.    Voicemails left after 4:30p.m will not be returned until the following business day.    For prescription refill requests, please have your pharmacy contact us directly.  Please also try to allow 48 hours for prescription requests.    Please contact the scheduling department for questions regarding scheduling.  For scheduling of procedures such as PET scans, CT scans, MRI, Ultrasound, etc please contact central scheduling at [phone].    Resources For Cancer Patients and Caregivers:   [employer]ink.org:  A wonderful resource for patients and healthcare providers for information regarding your disease, ways to tract your treatment, what to expect, etc.     [place]:  [phone]  Can help patients locate various types of support and financial assistance  Cancer Care: 1-800-813-HOPE 865-807-4761) Provides financial assistance, online support groups, medication/co-pay assistance.    [place]:  [phone] Where to apply for food  stamps, Medicaid, and utility assistance  Medicare Rights [facility]: 502-706-8539 Helps people with Medicare understand their rights and benefits, navigate the Medicare system, and secure the quality healthcare they deserve  [employer]: [place] [employer]'s shared-ride transportation service for eligible riders who have a disability that prevents them from riding the fixed route bus.    For additional information on assistance programs please contact our social worker:   [place]:  [phone]    Tbo-Filgrastim injection What is this medicine? TBO-FILGRASTIM (T B O fil GRA stim) is a granulocyte colony-stimulating factor that stimulates the growth of neutrophils, a type of white blood cell important in the body's fight against infection. It is used to reduce the incidence of fever and infection in patients with certain types of cancer who are receiving chemotherapy that affects the bone marrow. This medicine may be used for other purposes; ask your health care provider or pharmacist if you have questions. COMMON BRAND NAME(S): Granix What should I tell my health care provider before I take this medicine? They need to know if you have any of these conditions: -bone scan or tests planned -kidney disease -sickle cell anemia -an unusual or allergic reaction to tbo-filgrastim, filgrastim, pegfilgrastim, other medicines, foods, dyes, or preservatives -pregnant or trying to get pregnant -breast-feeding How should I use this medicine? This medicine is for injection under the skin. If you get this medicine at home, you will be taught how to prepare and give this medicine. Refer to the Instructions for Use that come with your medication packaging. Use exactly as directed. Take your medicine at regular intervals. Do not take your medicine more often than directed.  It is important that you put your used needles and syringes in a special sharps container. Do not put  them in a trash can. If you do not have a sharps container, call your pharmacist or healthcare provider to get one. Talk to your pediatrician regarding the use of this medicine in children. Special care may be needed. Overdosage: If you think you have taken too much of this medicine contact a poison control center or emergency room at once. NOTE: This medicine is only for you. Do not share this medicine with others. What if I miss a dose? It is important not to miss your dose. Call your doctor or health care professional if you miss a dose. What may interact with this medicine? This medicine may interact with the following medications: -medicines that may cause a release of neutrophils, such as lithium This list may not describe all possible interactions. Give your health care provider a list of all the medicines, herbs, non-prescription drugs, or dietary supplements you use. Also tell them if you smoke, drink alcohol, or use illegal drugs. Some items may interact with your medicine. What should I watch for while using this medicine? You may need blood work done while you are taking this medicine. What side effects may I notice from receiving this medicine? Side effects that you should report to your doctor or health care professional as soon as possible: -allergic reactions like skin rash, itching or hives, swelling of the face, lips, or tongue -blood in the urine -dark urine -dizziness -fast heartbeat -feeling faint -shortness of breath or breathing problems -signs and symptoms of infection like fever or chills; cough; or sore throat -signs and symptoms of kidney injury like trouble passing urine or change in the amount of urine -stomach or side pain, or pain at the shoulder -sweating -swelling of the legs, ankles, or abdomen -tiredness Side effects that usually do not require medical attention (report to your doctor or health care professional if they continue or are bothersome): -bone  pain -headache -muscle pain -vomiting This list may not describe all possible side effects. Call your doctor for medical advice about side effects. You may report side effects to FDA at 1-800-FDA-1088. Where should I keep my medicine? Keep out of the reach of children. Store in a refrigerator between 2 and 8 degrees C (36 and 46 degrees F). Keep in carton to protect from light. Throw away this medicine if it is left out of the refrigerator for more than 5 consecutive days. Throw away any unused medicine after the expiration date. NOTE: This sheet is a summary. It may not cover all possible information. If you have questions about this medicine, talk to your doctor, pharmacist, or health care provider.  2018 Elsevier/Gold Standard (2015-12-04 19:07:04)

## 2017-06-26 NOTE — Progress Notes (Signed)
Treatment cancelled today per Dr. Irene Limbo.  Pt to receive granix injection today, tomorrow, Saturday.  This RN administered granix today.  Pt aware to have granix scheduled for tomorrow/saturday.  Pt aware to reschedule treatment with labs in 1 week, no MD visit.

## 2017-06-26 NOTE — Telephone Encounter (Signed)
Gave patient avs and calendar with appts.  °

## 2017-06-27 ENCOUNTER — Telehealth: Payer: Self-pay

## 2017-06-27 ENCOUNTER — Ambulatory Visit: Payer: Medicare Other

## 2017-06-27 ENCOUNTER — Ambulatory Visit (HOSPITAL_BASED_OUTPATIENT_CLINIC_OR_DEPARTMENT_OTHER): Payer: Medicare Other

## 2017-06-27 VITALS — BP 134/65 | HR 73 | Temp 97.9°F | Resp 18

## 2017-06-27 DIAGNOSIS — C8318 Mantle cell lymphoma, lymph nodes of multiple sites: Secondary | ICD-10-CM

## 2017-06-27 DIAGNOSIS — D702 Other drug-induced agranulocytosis: Secondary | ICD-10-CM

## 2017-06-27 MED ORDER — TBO-FILGRASTIM 300 MCG/0.5ML ~~LOC~~ SOSY
300.0000 ug | PREFILLED_SYRINGE | Freq: Once | SUBCUTANEOUS | Status: AC
  Administered 2017-06-27: 300 ug via SUBCUTANEOUS
  Filled 2017-06-27: qty 0.5

## 2017-06-27 NOTE — Patient Instructions (Signed)
Tbo-Filgrastim injection What is this medicine? TBO-FILGRASTIM (T B O fil GRA stim) is a granulocyte colony-stimulating factor that stimulates the growth of neutrophils, a type of white blood cell important in the body's fight against infection. It is used to reduce the incidence of fever and infection in patients with certain types of cancer who are receiving chemotherapy that affects the bone marrow. This medicine may be used for other purposes; ask your health care provider or pharmacist if you have questions. COMMON BRAND NAME(S): Granix What should I tell my health care provider before I take this medicine? They need to know if you have any of these conditions: -bone scan or tests planned -kidney disease -sickle cell anemia -an unusual or allergic reaction to tbo-filgrastim, filgrastim, pegfilgrastim, other medicines, foods, dyes, or preservatives -pregnant or trying to get pregnant -breast-feeding How should I use this medicine? This medicine is for injection under the skin. If you get this medicine at home, you will be taught how to prepare and give this medicine. Refer to the Instructions for Use that come with your medication packaging. Use exactly as directed. Take your medicine at regular intervals. Do not take your medicine more often than directed. It is important that you put your used needles and syringes in a special sharps container. Do not put them in a trash can. If you do not have a sharps container, call your pharmacist or healthcare provider to get one. Talk to your pediatrician regarding the use of this medicine in children. Special care may be needed. Overdosage: If you think you have taken too much of this medicine contact a poison control center or emergency room at once. NOTE: This medicine is only for you. Do not share this medicine with others. What if I miss a dose? It is important not to miss your dose. Call your doctor or health care professional if you miss a  dose. What may interact with this medicine? This medicine may interact with the following medications: -medicines that may cause a release of neutrophils, such as lithium This list may not describe all possible interactions. Give your health care provider a list of all the medicines, herbs, non-prescription drugs, or dietary supplements you use. Also tell them if you smoke, drink alcohol, or use illegal drugs. Some items may interact with your medicine. What should I watch for while using this medicine? You may need blood work done while you are taking this medicine. What side effects may I notice from receiving this medicine? Side effects that you should report to your doctor or health care professional as soon as possible: -allergic reactions like skin rash, itching or hives, swelling of the face, lips, or tongue -blood in the urine -dark urine -dizziness -fast heartbeat -feeling faint -shortness of breath or breathing problems -signs and symptoms of infection like fever or chills; cough; or sore throat -signs and symptoms of kidney injury like trouble passing urine or change in the amount of urine -stomach or side pain, or pain at the shoulder -sweating -swelling of the legs, ankles, or abdomen -tiredness Side effects that usually do not require medical attention (report to your doctor or health care professional if they continue or are bothersome): -bone pain -headache -muscle pain -vomiting This list may not describe all possible side effects. Call your doctor for medical advice about side effects. You may report side effects to FDA at 1-800-FDA-1088. Where should I keep my medicine? Keep out of the reach of children. Store in a refrigerator between   2 and 8 degrees C (36 and 46 degrees F). Keep in carton to protect from light. Throw away this medicine if it is left out of the refrigerator for more than 5 consecutive days. Throw away any unused medicine after the expiration  date. NOTE: This sheet is a summary. It may not cover all possible information. If you have questions about this medicine, talk to your doctor, pharmacist, or health care provider.  2018 Elsevier/Gold Standard (2015-12-04 19:07:04)  

## 2017-06-27 NOTE — Telephone Encounter (Signed)
Contacted pt regarding flu shot. Dr. Irene Limbo would like for him to wait for now since he has been neutropenic. Pt verbalized understanding. Due for next infusion at the end of September. Appts verbalized and verified by pt.

## 2017-06-28 ENCOUNTER — Ambulatory Visit: Payer: Medicare Other

## 2017-06-28 ENCOUNTER — Ambulatory Visit (HOSPITAL_BASED_OUTPATIENT_CLINIC_OR_DEPARTMENT_OTHER): Payer: Medicare Other

## 2017-06-28 ENCOUNTER — Other Ambulatory Visit: Payer: Self-pay

## 2017-06-28 VITALS — BP 142/65 | HR 74 | Temp 97.7°F | Resp 16

## 2017-06-28 DIAGNOSIS — C8318 Mantle cell lymphoma, lymph nodes of multiple sites: Secondary | ICD-10-CM | POA: Diagnosis not present

## 2017-06-28 DIAGNOSIS — D701 Agranulocytosis secondary to cancer chemotherapy: Secondary | ICD-10-CM

## 2017-06-28 DIAGNOSIS — D702 Other drug-induced agranulocytosis: Secondary | ICD-10-CM

## 2017-06-28 MED ORDER — TBO-FILGRASTIM 300 MCG/0.5ML ~~LOC~~ SOSY
300.0000 ug | PREFILLED_SYRINGE | Freq: Once | SUBCUTANEOUS | Status: AC
  Administered 2017-06-28: 300 ug via SUBCUTANEOUS

## 2017-06-28 NOTE — Patient Instructions (Signed)
Tbo-Filgrastim injection What is this medicine? TBO-FILGRASTIM (T B O fil GRA stim) is a granulocyte colony-stimulating factor that stimulates the growth of neutrophils, a type of white blood cell important in the body's fight against infection. It is used to reduce the incidence of fever and infection in patients with certain types of cancer who are receiving chemotherapy that affects the bone marrow. This medicine may be used for other purposes; ask your health care provider or pharmacist if you have questions. COMMON BRAND NAME(S): Granix What should I tell my health care provider before I take this medicine? They need to know if you have any of these conditions: -bone scan or tests planned -kidney disease -sickle cell anemia -an unusual or allergic reaction to tbo-filgrastim, filgrastim, pegfilgrastim, other medicines, foods, dyes, or preservatives -pregnant or trying to get pregnant -breast-feeding How should I use this medicine? This medicine is for injection under the skin. If you get this medicine at home, you will be taught how to prepare and give this medicine. Refer to the Instructions for Use that come with your medication packaging. Use exactly as directed. Take your medicine at regular intervals. Do not take your medicine more often than directed. It is important that you put your used needles and syringes in a special sharps container. Do not put them in a trash can. If you do not have a sharps container, call your pharmacist or healthcare provider to get one. Talk to your pediatrician regarding the use of this medicine in children. Special care may be needed. Overdosage: If you think you have taken too much of this medicine contact a poison control center or emergency room at once. NOTE: This medicine is only for you. Do not share this medicine with others. What if I miss a dose? It is important not to miss your dose. Call your doctor or health care professional if you miss a  dose. What may interact with this medicine? This medicine may interact with the following medications: -medicines that may cause a release of neutrophils, such as lithium This list may not describe all possible interactions. Give your health care provider a list of all the medicines, herbs, non-prescription drugs, or dietary supplements you use. Also tell them if you smoke, drink alcohol, or use illegal drugs. Some items may interact with your medicine. What should I watch for while using this medicine? You may need blood work done while you are taking this medicine. What side effects may I notice from receiving this medicine? Side effects that you should report to your doctor or health care professional as soon as possible: -allergic reactions like skin rash, itching or hives, swelling of the face, lips, or tongue -blood in the urine -dark urine -dizziness -fast heartbeat -feeling faint -shortness of breath or breathing problems -signs and symptoms of infection like fever or chills; cough; or sore throat -signs and symptoms of kidney injury like trouble passing urine or change in the amount of urine -stomach or side pain, or pain at the shoulder -sweating -swelling of the legs, ankles, or abdomen -tiredness Side effects that usually do not require medical attention (report to your doctor or health care professional if they continue or are bothersome): -bone pain -headache -muscle pain -vomiting This list may not describe all possible side effects. Call your doctor for medical advice about side effects. You may report side effects to FDA at 1-800-FDA-1088. Where should I keep my medicine? Keep out of the reach of children. Store in a refrigerator between   2 and 8 degrees C (36 and 46 degrees F). Keep in carton to protect from light. Throw away this medicine if it is left out of the refrigerator for more than 5 consecutive days. Throw away any unused medicine after the expiration  date. NOTE: This sheet is a summary. It may not cover all possible information. If you have questions about this medicine, talk to your doctor, pharmacist, or health care provider.  2018 Elsevier/Gold Standard (2015-12-04 19:07:04)  

## 2017-07-01 ENCOUNTER — Ambulatory Visit: Payer: Medicare Other

## 2017-07-03 ENCOUNTER — Telehealth: Payer: Self-pay | Admitting: *Deleted

## 2017-07-03 NOTE — Progress Notes (Signed)
[name]    HEMATOLOGY/ONCOLOGY Clinic NOTE  Date of Service: .06/26/2017  Patient Care Team: Eulas Post, MD as PCP - General (Family Medicine)  CHIEF COMPLAINTS/PURPOSE OF CONSULTATION:  F/u mantle cell lymphoma  HISTORY OF PRESENTING ILLNESS:   Jacob Santa. is a wonderful 83 y.o. male who is transferring care to me for continued evaluation and management of newly diagnosed Mantle cell lymphoma.  Patient is a very pleasant 83 year old with a history of hypertension, dyslipidemia, psoriasis, fibromyalgia, coronary artery disease status post PCI in 2009, GERD who presented with episodic rectal bleeding and weight loss of about 40 pounds since November 2017. His daughter who is a Marine scientist and is present at this visit notes that his weight has dropped from 216 down to 175 pounds. He has also been having bothersome diarrhea that has significantly affected his quality of life.  Patient was evaluated by GI and had a colonoscopy in 12/30/2016 in which his terminal ileum was moderately inflamed and ulcerated, biopsies were taken with cold forceps, 2 sessile polyps were found in the ascending colon, the rectosigmoid region was moderately inflamed, biopsies were taken exam otherwise was normal underdirect retroflexion views. A clinical diagnosis has been made as ileocolitis by the gastroenterologist  However pathologist has reported,small bowel mucosa with atypical lymphoid infiltrates and scattered active inflammation.  A right colon biopsy also showed colonic mucosa with atypical lymphoid infiltrates and scattered active inflammatory cells.  Surgical biopsy of the ascending colon polyp wasdocumented as tubular adenomawith the atypical lymphoid infiltrates.   Left colon biopsy also has revealed atypical lymphoid infiltrates with scattered inflammatory cells. Biopsy of the rectal mucosa has revealed atypical lymphoid infiltrates and active inflammatory cells without any  dysplasia.   Microscopic examination with special stains has revealed infiltrates positive for CD20 cells, positive for BCL 6 and BCL-2 with high Ki 67 at 50% however FISH studies showed no evidence of BCL 6 or BCL-2 disruption. Cells were noted to be cyclin D1 positive. [place] for t(11;14) was not noted to be negative however in tumor Board the pathologist noted that this was likely due to limited sampling. The consensus from pathology was this was consistent overall with a mantle cell lymphoma.  Patient subsequently had a bone marrow biopsy which appeared consistent with mantle cell lymphoma.  PET/CT scan was done on 01/30/2017 and showed scattered hypermetabolic lymphadenopathy in the left neck mediastinum and hilum and right lower quadrant of the abdomen. Possible right colon lesion.  Patient along with his daughter who is an Therapist, sports and his wife are here to discuss treatment options. Patient notes he is very fatigued and is losing weight and is hoping to get started as soon as possible on treatment.  Daughter notes that he has been functioning quite well prior to the diarrhea and weight loss and was able to function independently. Patient notes that the diarrhea is bothering him the most and he has a fair amount of urgency.  No issues with urination.  Notes some chills and night sweats. Notes that he is following with Dr. Ardis Hughs and was on Canasa and hydrocortisone enemas.  After his weight loss he notes his blood pressure has been running somewhat low and this has led to a significant decrease in his blood pressure medications.  We discussed treatment options in details and informed consent was obtained to proceed with bendamustine and Rituxan chemo-immunotherapy .  INTERVAL HISTORY  [name] is here for follow-up prior to his fifth cycle of bendamustine and Rituxan.  He notes he is feeling well. No fevers no chills or night sweats. Does not report feeling any new enlarged lymph nodes.  No diarrhea or GI bleeding. Labs today showed neutropenia with ANC of 0.7k despite having received his Neulasta shot. We discussed that given good disease control of his mantle cell lymphoma that we shall proceed with Granix next for 3 days and reschedule his chemotherapy about a week out so that we reduce his risk of neutropenic sepsis.  No other acute new symptoms.  MEDICAL HISTORY:  Past Medical History:  Diagnosis Date  . Coronary artery disease    a. stent (promus) Cx/OM'09  . Fibromyalgia dx'd ~ 04/2015  . GERD (gastroesophageal reflux disease)   . Gout   . HEARING LOSS    "temporary"  . Hyperlipidemia   . HYPERLIPIDEMIA 10/13/2007  . Hypertension   . HYPERTENSION 10/13/2007  . Osteoarthritis    "left shoulder" (08/23/2015)  . Prostatitis, acute   . PROSTATITIS, ACUTE, HX OF 10/13/2007  . PSORIASIS 10/13/2007  . PVC (premature ventricular contraction)    hx  . SKIN CANCER, RECURRENT 10/13/2007    SURGICAL HISTORY: Past Surgical History:  Procedure Laterality Date  . APPENDECTOMY    . CORONARY ANGIOPLASTY WITH STENT PLACEMENT    . CYST EXCISION Right X 2   shoulder  . ELECTROLYSIS OF MISDIRECTED LASHES Bilateral    eyelashes are misdirected and grow inwards   . EYE SURGERY    . MASS EXCISION Right 01/2005   proximal thigh soft tissue mass  . TEAR DUCT PROBING Left   . TYMPANOPLASTY Bilateral    "for hearing loss; put tubes in also, 2X on right, 1X on the left; tubes worked"  . VASECTOMY      SOCIAL HISTORY: Social History   Social History  . Marital status: Married    Spouse name: N/A  . Number of children: 3  . Years of education: N/A   Occupational History  . retired Retired    from [employer].   Social History Main Topics  . Smoking status: Never Smoker  . Smokeless tobacco: Never Used  . Alcohol use No  . Drug use: No  . Sexual activity: No   Other Topics Concern  . Not on file   Social History Narrative   Married 1958   2 daughters- '59, 68, 1  son '61   8 grandchildren   Retired from [employer], works PT as a Curator. Now retired completely (09/2008)   End of life; does not want heroic measures if in a persistent vegative state    FAMILY HISTORY: Family History  Problem Relation Age of Onset  . Heart attack Father 66       died  . Heart disease Father   . Parkinsonism Mother 50       died  . Breast cancer Sister        died  . Lung cancer Unknown        uncle died  . Colon cancer Neg Hx     ALLERGIES:  is allergic to niacin.  MEDICATIONS:  Current Outpatient Prescriptions  Medication Sig Dispense Refill  . carvedilol (COREG) 6.25 MG tablet Take 1 tablet (6.25 mg total) by mouth 2 (two) times daily. 60 tablet 11  . NITROSTAT 0.4 MG SL tablet DISSOLVE 1 TABLET UNDER THE TONGUE EVERY 5 MINUTES AS NEEDED 25 tablet 3  . pantoprazole (PROTONIX) 40 MG tablet Take 1 tablet (40 mg total) by mouth daily. 30 tablet  3  . potassium chloride SA (K-DUR,KLOR-CON) 20 MEQ tablet Take 1 tablet (20 mEq total) by mouth daily. 30 tablet 0  . rosuvastatin (CRESTOR) 10 MG tablet Take one tablet by mouth daily prior to bedtime (Patient taking differently: Take 10 mg by mouth at bedtime. ) 90 tablet 3   Current Facility-Administered Medications  Medication Dose Route Frequency Provider Last Rate Last Dose  . 0.9 %  sodium chloride infusion  500 mL Intravenous Continuous Milus Banister, MD        REVIEW OF SYSTEMS:    10 Point review of Systems was done is negative except as noted above.  PHYSICAL EXAMINATION: ECOG PERFORMANCE STATUS: 2 - Symptomatic, <50% confined to bed  . Vitals:   06/26/17 0859  BP: (!) 144/68  Pulse: 70  Resp: 17  Temp: 98.3 F (36.8 C)  SpO2: 99%   Filed Weights   06/26/17 0859  Weight: 181 lb 6.4 oz (82.3 kg)   .Body mass index is 25.3 kg/m.  GENERAL:alert, in no acute distress and comfortable SKIN: no acute rashes, no significant lesions EYES: conjunctiva are pink and non-injected, sclera  anicteric OROPHARYNX: MMM, no exudates, no oropharyngeal erythema or ulceration NECK: supple, no JVD LYMPH:  Small palpable lymph nodes in the left neck , no palpable axillary or inguinal lymphadenopathy  LUNGS: clear to auscultation b/l with normal respiratory effort HEART: regular rate & rhythm ABDOMEN:  normoactive bowel sounds , non tender, not distended. Extremity: 2+ pitting pedal edema bilaterally PSYCH: alert & oriented x 3 with fluent speech NEURO: no focal motor/sensory deficits  LABORATORY DATA:  I have reviewed the data as listed  . CBC Latest Ref Rng & Units 06/26/2017 05/29/2017 05/01/2017  WBC 4.0 - 10.3 10e3/uL 4.3 3.1(L) 4.8  Hemoglobin 13.0 - 17.1 g/dL 12.1(L) 11.8(L) 11.5(L)  Hematocrit 38.4 - 49.9 % 37.3(L) 36.1(L) 36.1(L)  Platelets 140 - 400 10e3/uL 167 156 242   . CMP Latest Ref Rng & Units 06/26/2017 05/29/2017 05/01/2017  Glucose 70 - 140 mg/dl 91 88 88  BUN 7.0 - 26.0 mg/dL 12.6 8.6 10.1  Creatinine 0.7 - 1.3 mg/dL 0.9 0.9 0.8  Sodium 136 - 145 mEq/L 142 142 142  Potassium 3.5 - 5.1 mEq/L 4.3 4.0 3.9  Chloride 101 - 111 mmol/L - - -  CO2 22 - 29 mEq/L '27 28 26  '$ Calcium 8.4 - 10.4 mg/dL 9.0 8.9 9.0  Total Protein 6.4 - 8.3 g/dL 5.4(L) 5.4(L) 5.2(L)  Total Bilirubin 0.20 - 1.20 mg/dL 0.40 0.56 0.44  Alkaline Phos 40 - 150 U/L 77 82 77  AST 5 - 34 U/L '20 21 23  '$ ALT 0 - 55 U/L '13 11 10               '$ RADIOGRAPHIC STUDIES: I have personally reviewed the radiological images as listed and agreed with the findings in the report.  .Nm Pet Image Restag (ps) Skull Base To Thigh  Result Date: 06/03/2017 CLINICAL DATA:  Subsequent treatment strategy for mantle cell lymphoma. EXAM: NUCLEAR MEDICINE PET SKULL BASE TO THIGH TECHNIQUE: 8.7 mCi F-18 FDG was injected intravenously. Full-ring PET imaging was performed from the skull base to thigh after the radiotracer. CT data was obtained and used for attenuation correction and anatomic localization. FASTING BLOOD  GLUCOSE:  Value: 84 is mg/dl COMPARISON:  Prior PET-CT of 01/30/2017 FINDINGS: NECK Previous hypermetabolic left neck lymph nodes have resolved. There is some physiologic muscular activity in the neck. Small right mastoid effusion. Mild  chronic ethmoid sinusitis. CHEST Any of the previous mediastinal and hilar lymph nodes have resolved. There is some faint residual activity in the right hilum with maximum SUV in this vicinity 3.7, formerly 4.7. Subcarinal node measures 1.0 cm in short axis, formerly 1.2 cm, with maximum SUV of 2.3, formerly 5.6. Hypermetabolic activity in the paratracheal, AP window, and left hilar nodes has resolved. No new nodal activity in the chest. Posterior pleural thickening without significant hypermetabolic activity currently background mediastinal blood pool activity SUV is 2.1. Coronary atherosclerosis. ABDOMEN/PELVIS There continues to be some mildly accentuated bilateral adrenal activity diffusely common not appreciably changed from prior exam, an without associated mass. Non rotated left kidney. Aortoiliac atherosclerotic vascular disease. The liver, spleen, and pancreas appear unremarkable. No hypermetabolic adenopathy. In the vicinity of the prior ascending colon focus of accentuated metabolic activity there is no current abnormal hypermetabolic activity. The hypermetabolic right lower quadrant mesenteric lymph node shown on the prior exam seems to have resolved. Prominent stool throughout the colon favors constipation. There is some mild distal rectal wall thickening. Small amount of free pelvic fluid. Background hepatic activity 3.4. SKELETON Diffuse accentuated metabolic activity throughout the skeleton without CT correlate. Facet arthropathy in the lower lumbar spine. IMPRESSION: 1. Resolution of prior nodal activity in the neck and abdomen, with resolution of some of the previous lymph nodes in the chest, and only very low-grade activity reduced size remaining thoracic lymph  nodes. Overall appearance is of considerable improvement. 2. Diffuse skeletal activity, likely from granulocyte stimulation. 3. Other imaging findings of potential clinical significance: Small right mastoid effusion. Chronic ethmoid sinusitis. Aortic Atherosclerosis (ICD10-I70.0). Coronary atherosclerosis. Mild posterior pleural thickening without hypermetabolic activity. Non rotated left kidney. Small amount of free pelvic fluid. Electronically Signed   By: Van Clines M.D.   On: 06/03/2017 13:59   ASSESSMENT & PLAN:  83 year old male with  #1 Stage IVBE Mantle cell lymphoma He had hypermetabolic lymphadenopathy and extensive bowel involvement. Has significant constitutional symptoms including debilitating fatigue , weight loss of about 40 pounds since November 2017, anorexia, some subjective chills. Patient's constitutional symptoms have resolved.  #2 s/p persistent bothersome diarrhea and some rectal bleeding. Hemoglobin is stable at this time. This appears to be likely related to his mantle cell lymphoma. Could also have an underlying inflammatory disorder. Was on Canasa and hydrocortisone suppositories as per GI. Patient's diarrhea has resolved. Only minimal hemorrhoidal bleeding at this time. Plan -continue to monitor. Currently not on any treatment for IBD  #3 moderate to severe protein calorie malnutrition with significant weight loss- due to lymphoma and diarrhea. Much improved nutritional status  . Wt Readings from Last 3 Encounters:  06/26/17 181 lb 6.4 oz (82.3 kg)  06/16/17 180 lb 3.2 oz (81.7 kg)  06/04/17 184 lb (83.5 kg)   #4 Neutropenia ANC 0.7K related to chemotherapy and limited bone marrow reserve at the patient's age.   had received Neulasta but this is out of his system. Plan -Patient is doing well and reports no other new prohibitive toxicities. -He has significant neutropenia today with an [place] of 700 despite having received Neulasta after his last cycle of  chemotherapy. -We discussed and decided to delay his fifth cycle of bendamustine and Rituxan out 7-10 days given good disease control of his mantle cell lymphoma at this time and to reduce the risk of neutropenic sepsis. -We shall set him up for granix  daily for 3 days to help resolve his neutropenia faster  -Please reschedule C5 of  BR with G-CSF support from today to 7-8 days out due to neutropenia with repeat labs. -Neupogen daily x 3 days starting today -will need to reschedule C6 4 weeks after C5 -RTC with Dr Irene Limbo with C6D1 with labs  All of the patients questions were answered with apparent satisfaction. The patient knows to call the clinic with any problems, questions or concerns.  I spent 20 minutes counseling the patient face to face. The total time spent in the appointment was 25 minutes and more than 50% was on counseling and direct patient cares.    Jacob Lone MD [place] [facility] Hematology/Oncology Physician [facility]  (Office):       [phone] (Work cell):  [phone] (Fax):           [phone]

## 2017-07-03 NOTE — Telephone Encounter (Signed)
Pt called stating treatment was cancelled last week and he was supposed to be reschedule.  Confirmed LOS was placed.  No new apts scheduled.  This RN sent high priority message to scheduling.  Amy S, RN stated pt could be squeezed into infusion schedule today.  Per pt, it would be too late in the day for him to come in today.  Pt would prefer to be scheduled on Monday/Tuesday of next week.  Vonte with high priority notified.  Per Charlies Constable, message sent to cindy johnson to verify ok to schedule pt.  This RN also left VM with cindy johnson to verify.  This RN instructed pt that someone would call him to notify of upcoming apts.

## 2017-07-04 ENCOUNTER — Telehealth: Payer: Self-pay | Admitting: Hematology

## 2017-07-04 NOTE — Telephone Encounter (Signed)
sw pt to confirm 9/13 and 9/14 infusion at 1 pm per sch msg. OK to add to infusion per Murvin Natal email

## 2017-07-10 ENCOUNTER — Other Ambulatory Visit (HOSPITAL_BASED_OUTPATIENT_CLINIC_OR_DEPARTMENT_OTHER): Payer: Medicare Other

## 2017-07-10 ENCOUNTER — Other Ambulatory Visit: Payer: Self-pay | Admitting: Hematology

## 2017-07-10 ENCOUNTER — Encounter: Payer: Self-pay | Admitting: Pharmacist

## 2017-07-10 ENCOUNTER — Other Ambulatory Visit: Payer: Self-pay

## 2017-07-10 ENCOUNTER — Ambulatory Visit (HOSPITAL_BASED_OUTPATIENT_CLINIC_OR_DEPARTMENT_OTHER): Payer: Medicare Other

## 2017-07-10 VITALS — BP 122/69 | HR 63 | Temp 98.5°F | Resp 16

## 2017-07-10 DIAGNOSIS — C8318 Mantle cell lymphoma, lymph nodes of multiple sites: Secondary | ICD-10-CM | POA: Diagnosis present

## 2017-07-10 DIAGNOSIS — Z5111 Encounter for antineoplastic chemotherapy: Secondary | ICD-10-CM

## 2017-07-10 DIAGNOSIS — Z5112 Encounter for antineoplastic immunotherapy: Secondary | ICD-10-CM | POA: Diagnosis present

## 2017-07-10 LAB — COMPREHENSIVE METABOLIC PANEL
ALT: 7 U/L (ref 0–55)
ANION GAP: 8 meq/L (ref 3–11)
AST: 18 U/L (ref 5–34)
Albumin: 3 g/dL — ABNORMAL LOW (ref 3.5–5.0)
Alkaline Phosphatase: 75 U/L (ref 40–150)
BUN: 8.5 mg/dL (ref 7.0–26.0)
CO2: 24 meq/L (ref 22–29)
CREATININE: 0.9 mg/dL (ref 0.7–1.3)
Calcium: 8.8 mg/dL (ref 8.4–10.4)
Chloride: 111 mEq/L — ABNORMAL HIGH (ref 98–109)
EGFR: 76 mL/min/{1.73_m2} — AB (ref >90)
Glucose: 120 mg/dl (ref 70–140)
Potassium: 3.8 mEq/L (ref 3.5–5.1)
SODIUM: 143 meq/L (ref 136–145)
Total Bilirubin: 0.46 mg/dL (ref 0.20–1.20)
Total Protein: 5.3 g/dL — ABNORMAL LOW (ref 6.4–8.3)

## 2017-07-10 LAB — CBC WITH DIFFERENTIAL/PLATELET
BASO%: 1.1 % (ref 0.0–2.0)
Basophils Absolute: 0.1 10*3/uL (ref 0.0–0.1)
EOS%: 3.7 % (ref 0.0–7.0)
Eosinophils Absolute: 0.2 10*3/uL (ref 0.0–0.5)
HCT: 37 % — ABNORMAL LOW (ref 38.4–49.9)
HGB: 12.2 g/dL — ABNORMAL LOW (ref 13.0–17.1)
LYMPH#: 2.4 10*3/uL (ref 0.9–3.3)
LYMPH%: 52.9 % — AB (ref 14.0–49.0)
MCH: 33.3 pg (ref 27.2–33.4)
MCHC: 33 g/dL (ref 32.0–36.0)
MCV: 101.1 fL — ABNORMAL HIGH (ref 79.3–98.0)
MONO#: 0.4 10*3/uL (ref 0.1–0.9)
MONO%: 9.5 % (ref 0.0–14.0)
NEUT#: 1.5 10*3/uL (ref 1.5–6.5)
NEUT%: 32.8 % — AB (ref 39.0–75.0)
PLATELETS: 145 10*3/uL (ref 140–400)
RBC: 3.66 10*6/uL — AB (ref 4.20–5.82)
RDW: 14.2 % (ref 11.0–14.6)
WBC: 4.6 10*3/uL (ref 4.0–10.3)

## 2017-07-10 MED ORDER — DIPHENHYDRAMINE HCL 25 MG PO CAPS
50.0000 mg | ORAL_CAPSULE | Freq: Once | ORAL | Status: AC
  Administered 2017-07-10: 50 mg via ORAL

## 2017-07-10 MED ORDER — PALONOSETRON HCL INJECTION 0.25 MG/5ML
0.2500 mg | Freq: Once | INTRAVENOUS | Status: AC
  Administered 2017-07-10: 0.25 mg via INTRAVENOUS

## 2017-07-10 MED ORDER — ACETAMINOPHEN 325 MG PO TABS
650.0000 mg | ORAL_TABLET | Freq: Once | ORAL | Status: AC
  Administered 2017-07-10: 650 mg via ORAL

## 2017-07-10 MED ORDER — PALONOSETRON HCL INJECTION 0.25 MG/5ML
INTRAVENOUS | Status: AC
  Filled 2017-07-10: qty 5

## 2017-07-10 MED ORDER — SODIUM CHLORIDE 0.9 % IV SOLN
Freq: Once | INTRAVENOUS | Status: AC
  Administered 2017-07-10: 14:00:00 via INTRAVENOUS

## 2017-07-10 MED ORDER — DEXAMETHASONE SODIUM PHOSPHATE 10 MG/ML IJ SOLN
10.0000 mg | Freq: Once | INTRAMUSCULAR | Status: AC
  Administered 2017-07-10: 10 mg via INTRAVENOUS

## 2017-07-10 MED ORDER — BENDAMUSTINE HCL CHEMO INJECTION 100 MG/4ML
70.0000 mg/m2 | Freq: Once | INTRAVENOUS | Status: AC
  Administered 2017-07-10: 150 mg via INTRAVENOUS
  Filled 2017-07-10: qty 6

## 2017-07-10 MED ORDER — DIPHENHYDRAMINE HCL 25 MG PO CAPS
ORAL_CAPSULE | ORAL | Status: AC
  Filled 2017-07-10: qty 2

## 2017-07-10 MED ORDER — ACETAMINOPHEN 325 MG PO TABS
ORAL_TABLET | ORAL | Status: AC
  Filled 2017-07-10: qty 2

## 2017-07-10 MED ORDER — POTASSIUM CHLORIDE CRYS ER 20 MEQ PO TBCR: 20.0000 meq | EXTENDED_RELEASE_TABLET | Freq: Every day | ORAL | 0 refills | Status: DC

## 2017-07-10 MED ORDER — RITUXIMAB CHEMO INJECTION 500 MG/50ML
375.0000 mg/m2 | Freq: Once | INTRAVENOUS | Status: AC
  Administered 2017-07-10: 800 mg via INTRAVENOUS
  Filled 2017-07-10: qty 50

## 2017-07-10 MED ORDER — DEXAMETHASONE SODIUM PHOSPHATE 10 MG/ML IJ SOLN
INTRAMUSCULAR | Status: AC
  Filled 2017-07-10: qty 1

## 2017-07-10 NOTE — Patient Instructions (Signed)
[facility] Discharge Instructions for Patients Receiving Chemotherapy  Today you received the following chemotherapy agents: Rituxan and Bendeka  To help prevent nausea and vomiting after your treatment, we encourage you to take your nausea medication as directed.    If you develop nausea and vomiting that is not controlled by your nausea medication, call the clinic.   BELOW ARE SYMPTOMS THAT SHOULD BE REPORTED IMMEDIATELY:  *FEVER GREATER THAN 100.5 F  *CHILLS WITH OR WITHOUT FEVER  NAUSEA AND VOMITING THAT IS NOT CONTROLLED WITH YOUR NAUSEA MEDICATION  *UNUSUAL SHORTNESS OF BREATH  *UNUSUAL BRUISING OR BLEEDING  TENDERNESS IN MOUTH AND THROAT WITH OR WITHOUT PRESENCE OF ULCERS  *URINARY PROBLEMS  *BOWEL PROBLEMS  UNUSUAL RASH Items with * indicate a potential emergency and should be followed up as soon as possible.  Feel free to call the clinic you have any questions or concerns. The clinic phone number is [phone].  Please show the [place] at check-in to the Emergency Department and triage nurse.

## 2017-07-11 ENCOUNTER — Telehealth: Payer: Self-pay

## 2017-07-11 ENCOUNTER — Ambulatory Visit (HOSPITAL_BASED_OUTPATIENT_CLINIC_OR_DEPARTMENT_OTHER): Payer: Medicare Other

## 2017-07-11 VITALS — BP 158/74 | HR 55 | Temp 97.5°F | Resp 17

## 2017-07-11 DIAGNOSIS — Z5111 Encounter for antineoplastic chemotherapy: Secondary | ICD-10-CM | POA: Diagnosis present

## 2017-07-11 DIAGNOSIS — C8318 Mantle cell lymphoma, lymph nodes of multiple sites: Secondary | ICD-10-CM | POA: Diagnosis not present

## 2017-07-11 MED ORDER — DEXAMETHASONE SODIUM PHOSPHATE 10 MG/ML IJ SOLN
INTRAMUSCULAR | Status: AC
  Filled 2017-07-11: qty 1

## 2017-07-11 MED ORDER — SODIUM CHLORIDE 0.9 % IV SOLN
70.0000 mg/m2 | Freq: Once | INTRAVENOUS | Status: AC
  Administered 2017-07-11: 150 mg via INTRAVENOUS
  Filled 2017-07-11: qty 6

## 2017-07-11 MED ORDER — SODIUM CHLORIDE 0.9 % IV SOLN
Freq: Once | INTRAVENOUS | Status: AC
  Administered 2017-07-11: 15:00:00 via INTRAVENOUS

## 2017-07-11 MED ORDER — DEXAMETHASONE SODIUM PHOSPHATE 10 MG/ML IJ SOLN
10.0000 mg | Freq: Once | INTRAMUSCULAR | Status: AC
  Administered 2017-07-11: 10 mg via INTRAVENOUS

## 2017-07-11 NOTE — Patient Instructions (Signed)
[facility] Discharge Instructions for Patients Receiving Chemotherapy  Today you received the following chemotherapy agents Bendeka.   To help prevent nausea and vomiting after your treatment, we encourage you to take your nausea medication as directed.    If you develop nausea and vomiting that is not controlled by your nausea medication, call the clinic.   BELOW ARE SYMPTOMS THAT SHOULD BE REPORTED IMMEDIATELY:  *FEVER GREATER THAN 100.5 F  *CHILLS WITH OR WITHOUT FEVER  NAUSEA AND VOMITING THAT IS NOT CONTROLLED WITH YOUR NAUSEA MEDICATION  *UNUSUAL SHORTNESS OF BREATH  *UNUSUAL BRUISING OR BLEEDING  TENDERNESS IN MOUTH AND THROAT WITH OR WITHOUT PRESENCE OF ULCERS  *URINARY PROBLEMS  *BOWEL PROBLEMS  UNUSUAL RASH Items with * indicate a potential emergency and should be followed up as soon as possible.  Feel free to call the clinic you have any questions or concerns. The clinic phone number is [phone].  Please show the CHEMO ALERT CARD at check-in to the Emergency Department and triage nurse.   

## 2017-07-11 NOTE — Telephone Encounter (Signed)
Pt will not be able to get neulast injection tomorrow due to infusion room closing time in relation to finish time for pt treatment today. Scheduling message sent to reschedule for Monday (9/17).

## 2017-07-12 ENCOUNTER — Ambulatory Visit: Payer: Medicare Other

## 2017-07-14 ENCOUNTER — Ambulatory Visit (HOSPITAL_BASED_OUTPATIENT_CLINIC_OR_DEPARTMENT_OTHER): Payer: Medicare Other

## 2017-07-14 VITALS — BP 144/70 | HR 53 | Temp 97.1°F | Resp 20

## 2017-07-14 DIAGNOSIS — C8318 Mantle cell lymphoma, lymph nodes of multiple sites: Secondary | ICD-10-CM

## 2017-07-14 DIAGNOSIS — D701 Agranulocytosis secondary to cancer chemotherapy: Secondary | ICD-10-CM

## 2017-07-14 MED ORDER — PEGFILGRASTIM INJECTION 6 MG/0.6ML ~~LOC~~
6.0000 mg | PREFILLED_SYRINGE | Freq: Once | SUBCUTANEOUS | Status: AC
  Administered 2017-07-14: 6 mg via SUBCUTANEOUS
  Filled 2017-07-14: qty 0.6

## 2017-07-14 NOTE — Patient Instructions (Signed)
Pegfilgrastim injection What is this medicine? PEGFILGRASTIM (PEG fil gra stim) is a long-acting granulocyte colony-stimulating factor that stimulates the growth of neutrophils, a type of white blood cell important in the body's fight against infection. It is used to reduce the incidence of fever and infection in patients with certain types of cancer who are receiving chemotherapy that affects the bone marrow, and to increase survival after being exposed to high doses of radiation. This medicine may be used for other purposes; ask your health care provider or pharmacist if you have questions. COMMON BRAND NAME(S): Neulasta What should I tell my health care provider before I take this medicine? They need to know if you have any of these conditions: -kidney disease -latex allergy -ongoing radiation therapy -sickle cell disease -skin reactions to acrylic adhesives (On-Body Injector only) -an unusual or allergic reaction to pegfilgrastim, filgrastim, other medicines, foods, dyes, or preservatives -pregnant or trying to get pregnant -breast-feeding How should I use this medicine? This medicine is for injection under the skin. If you get this medicine at home, you will be taught how to prepare and give the pre-filled syringe or how to use the On-body Injector. Refer to the patient Instructions for Use for detailed instructions. Use exactly as directed. Tell your healthcare provider immediately if you suspect that the On-body Injector may not have performed as intended or if you suspect the use of the On-body Injector resulted in a missed or partial dose. It is important that you put your used needles and syringes in a special sharps container. Do not put them in a trash can. If you do not have a sharps container, call your pharmacist or healthcare provider to get one. Talk to your pediatrician regarding the use of this medicine in children. While this drug may be prescribed for selected conditions,  precautions do apply. Overdosage: If you think you have taken too much of this medicine contact a poison control center or emergency room at once. NOTE: This medicine is only for you. Do not share this medicine with others. What if I miss a dose? It is important not to miss your dose. Call your doctor or health care professional if you miss your dose. If you miss a dose due to an On-body Injector failure or leakage, a new dose should be administered as soon as possible using a single prefilled syringe for manual use. What may interact with this medicine? Interactions have not been studied. Give your health care provider a list of all the medicines, herbs, non-prescription drugs, or dietary supplements you use. Also tell them if you smoke, drink alcohol, or use illegal drugs. Some items may interact with your medicine. This list may not describe all possible interactions. Give your health care provider a list of all the medicines, herbs, non-prescription drugs, or dietary supplements you use. Also tell them if you smoke, drink alcohol, or use illegal drugs. Some items may interact with your medicine. What should I watch for while using this medicine? You may need blood work done while you are taking this medicine. If you are going to need a MRI, CT scan, or other procedure, tell your doctor that you are using this medicine (On-Body Injector only). What side effects may I notice from receiving this medicine? Side effects that you should report to your doctor or health care professional as soon as possible: -allergic reactions like skin rash, itching or hives, swelling of the face, lips, or tongue -dizziness -fever -pain, redness, or irritation at site   where injected -pinpoint red spots on the skin -red or dark-brown urine -shortness of breath or breathing problems -stomach or side pain, or pain at the shoulder -swelling -tiredness -trouble passing urine or change in the amount of urine Side  effects that usually do not require medical attention (report to your doctor or health care professional if they continue or are bothersome): -bone pain -muscle pain This list may not describe all possible side effects. Call your doctor for medical advice about side effects. You may report side effects to FDA at 1-800-FDA-1088. Where should I keep my medicine? Keep out of the reach of children. Store pre-filled syringes in a refrigerator between 2 and 8 degrees C (36 and 46 degrees F). Do not freeze. Keep in carton to protect from light. Throw away this medicine if it is left out of the refrigerator for more than 48 hours. Throw away any unused medicine after the expiration date. NOTE: This sheet is a summary. It may not cover all possible information. If you have questions about this medicine, talk to your doctor, pharmacist, or health care provider.  2018 Elsevier/Gold Standard (2016-10-10 12:58:03)  

## 2017-07-17 ENCOUNTER — Encounter: Payer: Self-pay | Admitting: Family Medicine

## 2017-07-18 ENCOUNTER — Encounter (HOSPITAL_COMMUNITY): Payer: Self-pay

## 2017-07-18 ENCOUNTER — Emergency Department (HOSPITAL_COMMUNITY)
Admission: EM | Admit: 2017-07-18 | Discharge: 2017-07-18 | Disposition: A | Discharge status: Home / Self Care | Payer: Medicare Other | Attending: Emergency Medicine | Admitting: Emergency Medicine

## 2017-07-18 DIAGNOSIS — Z5321 Procedure and treatment not carried out due to patient leaving prior to being seen by health care provider: Secondary | ICD-10-CM | POA: Diagnosis not present

## 2017-07-18 DIAGNOSIS — M542 Cervicalgia: Secondary | ICD-10-CM | POA: Diagnosis not present
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
## 2017-07-18 NOTE — ED Notes (Signed)
Pt called for v/s recheck, no response from lobby 

## 2017-07-24 ENCOUNTER — Ambulatory Visit: Payer: Medicare Other

## 2017-07-25 ENCOUNTER — Ambulatory Visit: Payer: Medicare Other

## 2017-07-28 ENCOUNTER — Ambulatory Visit: Payer: Medicare Other

## 2017-07-30 ENCOUNTER — Ambulatory Visit (INDEPENDENT_AMBULATORY_CARE_PROVIDER_SITE_OTHER): Payer: Medicare Other | Admitting: Family Medicine

## 2017-07-30 ENCOUNTER — Encounter: Payer: Self-pay | Admitting: Family Medicine

## 2017-07-30 VITALS — BP 150/80 | HR 69 | Temp 98.3°F

## 2017-07-30 DIAGNOSIS — I2510 Atherosclerotic heart disease of native coronary artery without angina pectoris: Secondary | ICD-10-CM

## 2017-07-30 DIAGNOSIS — M79605 Pain in left leg: Secondary | ICD-10-CM | POA: Diagnosis not present

## 2017-07-30 DIAGNOSIS — M545 Low back pain, unspecified: Secondary | ICD-10-CM

## 2017-07-30 DIAGNOSIS — M79604 Pain in right leg: Secondary | ICD-10-CM

## 2017-07-30 MED ORDER — TRAMADOL HCL 50 MG PO TABS: 50.0000 mg | ORAL_TABLET | Freq: Four times a day (QID) | ORAL | 0 refills | Status: DC | PRN
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
## 2017-07-30 NOTE — Patient Instructions (Signed)
Let  Me know if pain not improving in 2 weeks Follow up for any weakness or progressive pain.
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
## 2017-08-07 ENCOUNTER — Other Ambulatory Visit (HOSPITAL_BASED_OUTPATIENT_CLINIC_OR_DEPARTMENT_OTHER): Payer: Medicare Other

## 2017-08-07 ENCOUNTER — Ambulatory Visit (HOSPITAL_BASED_OUTPATIENT_CLINIC_OR_DEPARTMENT_OTHER): Payer: Medicare Other | Admitting: Hematology

## 2017-08-07 ENCOUNTER — Ambulatory Visit (HOSPITAL_BASED_OUTPATIENT_CLINIC_OR_DEPARTMENT_OTHER): Payer: Medicare Other

## 2017-08-07 ENCOUNTER — Encounter: Payer: Self-pay | Admitting: Hematology

## 2017-08-07 VITALS — BP 162/82 | HR 72 | Temp 97.8°F | Resp 20 | Ht 71.0 in | Wt 185.2 lb

## 2017-08-07 VITALS — BP 140/67 | HR 72 | Temp 98.0°F | Resp 20

## 2017-08-07 DIAGNOSIS — Z5112 Encounter for antineoplastic immunotherapy: Secondary | ICD-10-CM

## 2017-08-07 DIAGNOSIS — M25562 Pain in left knee: Secondary | ICD-10-CM | POA: Diagnosis not present

## 2017-08-07 DIAGNOSIS — C8318 Mantle cell lymphoma, lymph nodes of multiple sites: Secondary | ICD-10-CM

## 2017-08-07 DIAGNOSIS — D701 Agranulocytosis secondary to cancer chemotherapy: Secondary | ICD-10-CM

## 2017-08-07 DIAGNOSIS — Z7189 Other specified counseling: Secondary | ICD-10-CM | POA: Insufficient documentation

## 2017-08-07 DIAGNOSIS — M25561 Pain in right knee: Secondary | ICD-10-CM | POA: Diagnosis not present

## 2017-08-07 DIAGNOSIS — M542 Cervicalgia: Secondary | ICD-10-CM | POA: Diagnosis not present

## 2017-08-07 DIAGNOSIS — D702 Other drug-induced agranulocytosis: Secondary | ICD-10-CM

## 2017-08-07 DIAGNOSIS — M545 Low back pain: Secondary | ICD-10-CM

## 2017-08-07 DIAGNOSIS — Z5111 Encounter for antineoplastic chemotherapy: Secondary | ICD-10-CM | POA: Diagnosis not present

## 2017-08-07 LAB — COMPREHENSIVE METABOLIC PANEL
ALT: 11 U/L (ref 0–55)
AST: 23 U/L (ref 5–34)
Albumin: 3.1 g/dL — ABNORMAL LOW (ref 3.5–5.0)
Alkaline Phosphatase: 80 U/L (ref 40–150)
Anion Gap: 7 mEq/L (ref 3–11)
BUN: 10.5 mg/dL (ref 7.0–26.0)
CALCIUM: 8.7 mg/dL (ref 8.4–10.4)
CHLORIDE: 112 meq/L — AB (ref 98–109)
CO2: 24 mEq/L (ref 22–29)
CREATININE: 0.9 mg/dL (ref 0.7–1.3)
EGFR: >60 mL/min/{1.73_m2} (ref >60)
GLUCOSE: 85 mg/dL (ref 70–140)
POTASSIUM: 4 meq/L (ref 3.5–5.1)
SODIUM: 143 meq/L (ref 136–145)
Total Bilirubin: 0.37 mg/dL (ref 0.20–1.20)
Total Protein: 5.3 g/dL — ABNORMAL LOW (ref 6.4–8.3)

## 2017-08-07 LAB — CBC WITH DIFFERENTIAL/PLATELET
BASO%: 1.3 % (ref 0.0–2.0)
BASOS ABS: 0.1 10*3/uL (ref 0.0–0.1)
EOS%: 5.8 % (ref 0.0–7.0)
Eosinophils Absolute: 0.2 10*3/uL (ref 0.0–0.5)
HEMATOCRIT: 35.7 % — AB (ref 38.4–49.9)
HEMOGLOBIN: 11.9 g/dL — AB (ref 13.0–17.1)
LYMPH#: 1.7 10*3/uL (ref 0.9–3.3)
LYMPH%: 42.8 % (ref 14.0–49.0)
MCH: 33.5 pg — AB (ref 27.2–33.4)
MCHC: 33.3 g/dL (ref 32.0–36.0)
MCV: 100.6 fL — ABNORMAL HIGH (ref 79.3–98.0)
MONO#: 0.6 10*3/uL (ref 0.1–0.9)
MONO%: 14.6 % — AB (ref 0.0–14.0)
NEUT#: 1.4 10*3/uL — ABNORMAL LOW (ref 1.5–6.5)
NEUT%: 35.5 % — AB (ref 39.0–75.0)
Platelets: 156 10*3/uL (ref 140–400)
RBC: 3.55 10*6/uL — ABNORMAL LOW (ref 4.20–5.82)
RDW: 14.4 % (ref 11.0–14.6)
WBC: 4 10*3/uL (ref 4.0–10.3)

## 2017-08-07 MED ORDER — HEPARIN SOD (PORK) LOCK FLUSH 100 UNIT/ML IV SOLN
500.0000 [IU] | Freq: Once | INTRAVENOUS | Status: DC | PRN
  Filled 2017-08-07: qty 5

## 2017-08-07 MED ORDER — SODIUM CHLORIDE 0.9 % IV SOLN
Freq: Once | INTRAVENOUS | Status: AC
  Administered 2017-08-07: 12:00:00 via INTRAVENOUS

## 2017-08-07 MED ORDER — SODIUM CHLORIDE 0.9 % IV SOLN
375.0000 mg/m2 | Freq: Once | INTRAVENOUS | Status: AC
  Administered 2017-08-07: 800 mg via INTRAVENOUS
  Filled 2017-08-07: qty 50

## 2017-08-07 MED ORDER — ACETAMINOPHEN 325 MG PO TABS
ORAL_TABLET | ORAL | Status: AC
  Filled 2017-08-07: qty 2

## 2017-08-07 MED ORDER — DIPHENHYDRAMINE HCL 25 MG PO CAPS
ORAL_CAPSULE | ORAL | Status: AC
  Filled 2017-08-07: qty 2

## 2017-08-07 MED ORDER — PALONOSETRON HCL INJECTION 0.25 MG/5ML
0.2500 mg | Freq: Once | INTRAVENOUS | Status: AC
  Administered 2017-08-07: 0.25 mg via INTRAVENOUS

## 2017-08-07 MED ORDER — DEXAMETHASONE SODIUM PHOSPHATE 10 MG/ML IJ SOLN
INTRAMUSCULAR | Status: AC
  Filled 2017-08-07: qty 1

## 2017-08-07 MED ORDER — DIPHENHYDRAMINE HCL 25 MG PO CAPS
50.0000 mg | ORAL_CAPSULE | Freq: Once | ORAL | Status: AC
  Administered 2017-08-07: 50 mg via ORAL

## 2017-08-07 MED ORDER — SODIUM CHLORIDE 0.9 % IV SOLN
70.0000 mg/m2 | Freq: Once | INTRAVENOUS | Status: AC
  Administered 2017-08-07: 150 mg via INTRAVENOUS
  Filled 2017-08-07: qty 6

## 2017-08-07 MED ORDER — PALONOSETRON HCL INJECTION 0.25 MG/5ML
INTRAVENOUS | Status: AC
  Filled 2017-08-07: qty 5

## 2017-08-07 MED ORDER — ACETAMINOPHEN 325 MG PO TABS
650.0000 mg | ORAL_TABLET | Freq: Once | ORAL | Status: AC
  Administered 2017-08-07: 650 mg via ORAL

## 2017-08-07 MED ORDER — SODIUM CHLORIDE 0.9% FLUSH
10.0000 mL | INTRAVENOUS | Status: DC | PRN
  Filled 2017-08-07: qty 10

## 2017-08-07 MED ORDER — DEXAMETHASONE SODIUM PHOSPHATE 10 MG/ML IJ SOLN
10.0000 mg | Freq: Once | INTRAMUSCULAR | Status: AC
  Administered 2017-08-07: 10 mg via INTRAVENOUS

## 2017-08-07 NOTE — Progress Notes (Signed)
Dr. Irene Limbo okay to tx with ANC 1.4.

## 2017-08-07 NOTE — Patient Instructions (Signed)
[facility] Discharge Instructions for Patients Receiving Chemotherapy  Today you received the following chemotherapy agents:  Rituxan and Bendeka.  To help prevent nausea and vomiting after your treatment, we encourage you to take your nausea medication as directed.   If you develop nausea and vomiting that is not controlled by your nausea medication, call the clinic.   BELOW ARE SYMPTOMS THAT SHOULD BE REPORTED IMMEDIATELY:  *FEVER GREATER THAN 100.5 F  *CHILLS WITH OR WITHOUT FEVER  NAUSEA AND VOMITING THAT IS NOT CONTROLLED WITH YOUR NAUSEA MEDICATION  *UNUSUAL SHORTNESS OF BREATH  *UNUSUAL BRUISING OR BLEEDING  TENDERNESS IN MOUTH AND THROAT WITH OR WITHOUT PRESENCE OF ULCERS  *URINARY PROBLEMS  *BOWEL PROBLEMS  UNUSUAL RASH Items with * indicate a potential emergency and should be followed up as soon as possible.  Feel free to call the clinic should you have any questions or concerns. The clinic phone number is [phone].  Please show the CHEMO ALERT CARD at check-in to the Emergency Department and triage nurse.   

## 2017-08-08 ENCOUNTER — Ambulatory Visit (HOSPITAL_BASED_OUTPATIENT_CLINIC_OR_DEPARTMENT_OTHER): Payer: Medicare Other

## 2017-08-08 VITALS — BP 149/79 | HR 68 | Temp 98.3°F | Resp 18

## 2017-08-08 DIAGNOSIS — Z5111 Encounter for antineoplastic chemotherapy: Secondary | ICD-10-CM | POA: Diagnosis present

## 2017-08-08 DIAGNOSIS — C8318 Mantle cell lymphoma, lymph nodes of multiple sites: Secondary | ICD-10-CM | POA: Diagnosis not present

## 2017-08-08 DIAGNOSIS — Z7189 Other specified counseling: Secondary | ICD-10-CM

## 2017-08-08 MED ORDER — DEXAMETHASONE SODIUM PHOSPHATE 10 MG/ML IJ SOLN
10.0000 mg | Freq: Once | INTRAMUSCULAR | Status: AC
  Administered 2017-08-08: 10 mg via INTRAVENOUS

## 2017-08-08 MED ORDER — BENDAMUSTINE HCL CHEMO INJECTION 100 MG/4ML
70.0000 mg/m2 | Freq: Once | INTRAVENOUS | Status: AC
  Administered 2017-08-08: 150 mg via INTRAVENOUS
  Filled 2017-08-08: qty 6

## 2017-08-08 MED ORDER — DEXAMETHASONE SODIUM PHOSPHATE 10 MG/ML IJ SOLN
INTRAMUSCULAR | Status: AC
  Filled 2017-08-08: qty 1

## 2017-08-08 MED ORDER — SODIUM CHLORIDE 0.9 % IV SOLN
Freq: Once | INTRAVENOUS | Status: AC
  Administered 2017-08-08: 11:00:00 via INTRAVENOUS

## 2017-08-08 NOTE — Patient Instructions (Signed)
[facility] Discharge Instructions for Patients Receiving Chemotherapy  Today you received the following chemotherapy agents Bendeka.   To help prevent nausea and vomiting after your treatment, we encourage you to take your nausea medication as directed.    If you develop nausea and vomiting that is not controlled by your nausea medication, call the clinic.   BELOW ARE SYMPTOMS THAT SHOULD BE REPORTED IMMEDIATELY:  *FEVER GREATER THAN 100.5 F  *CHILLS WITH OR WITHOUT FEVER  NAUSEA AND VOMITING THAT IS NOT CONTROLLED WITH YOUR NAUSEA MEDICATION  *UNUSUAL SHORTNESS OF BREATH  *UNUSUAL BRUISING OR BLEEDING  TENDERNESS IN MOUTH AND THROAT WITH OR WITHOUT PRESENCE OF ULCERS  *URINARY PROBLEMS  *BOWEL PROBLEMS  UNUSUAL RASH Items with * indicate a potential emergency and should be followed up as soon as possible.  Feel free to call the clinic you have any questions or concerns. The clinic phone number is [phone].  Please show the CHEMO ALERT CARD at check-in to the Emergency Department and triage nurse.   

## 2017-08-09 ENCOUNTER — Ambulatory Visit (HOSPITAL_BASED_OUTPATIENT_CLINIC_OR_DEPARTMENT_OTHER): Payer: Medicare Other

## 2017-08-09 VITALS — BP 157/66 | HR 60 | Temp 98.2°F | Resp 18

## 2017-08-09 DIAGNOSIS — C8318 Mantle cell lymphoma, lymph nodes of multiple sites: Secondary | ICD-10-CM

## 2017-08-09 DIAGNOSIS — D702 Other drug-induced agranulocytosis: Secondary | ICD-10-CM

## 2017-08-09 DIAGNOSIS — Z7189 Other specified counseling: Secondary | ICD-10-CM

## 2017-08-09 MED ORDER — PEGFILGRASTIM INJECTION 6 MG/0.6ML ~~LOC~~
6.0000 mg | PREFILLED_SYRINGE | Freq: Once | SUBCUTANEOUS | Status: AC
  Administered 2017-08-09: 6 mg via SUBCUTANEOUS

## 2017-08-09 NOTE — Patient Instructions (Signed)
Pegfilgrastim injection What is this medicine? PEGFILGRASTIM (PEG fil gra stim) is a long-acting granulocyte colony-stimulating factor that stimulates the growth of neutrophils, a type of white blood cell important in the body's fight against infection. It is used to reduce the incidence of fever and infection in patients with certain types of cancer who are receiving chemotherapy that affects the bone marrow, and to increase survival after being exposed to high doses of radiation. This medicine may be used for other purposes; ask your health care provider or pharmacist if you have questions. COMMON BRAND NAME(S): Neulasta What should I tell my health care provider before I take this medicine? They need to know if you have any of these conditions: -kidney disease -latex allergy -ongoing radiation therapy -sickle cell disease -skin reactions to acrylic adhesives (On-Body Injector only) -an unusual or allergic reaction to pegfilgrastim, filgrastim, other medicines, foods, dyes, or preservatives -pregnant or trying to get pregnant -breast-feeding How should I use this medicine? This medicine is for injection under the skin. If you get this medicine at home, you will be taught how to prepare and give the pre-filled syringe or how to use the On-body Injector. Refer to the patient Instructions for Use for detailed instructions. Use exactly as directed. Tell your healthcare provider immediately if you suspect that the On-body Injector may not have performed as intended or if you suspect the use of the On-body Injector resulted in a missed or partial dose. It is important that you put your used needles and syringes in a special sharps container. Do not put them in a trash can. If you do not have a sharps container, call your pharmacist or healthcare provider to get one. Talk to your pediatrician regarding the use of this medicine in children. While this drug may be prescribed for selected conditions,  precautions do apply. Overdosage: If you think you have taken too much of this medicine contact a poison control center or emergency room at once. NOTE: This medicine is only for you. Do not share this medicine with others. What if I miss a dose? It is important not to miss your dose. Call your doctor or health care professional if you miss your dose. If you miss a dose due to an On-body Injector failure or leakage, a new dose should be administered as soon as possible using a single prefilled syringe for manual use. What may interact with this medicine? Interactions have not been studied. Give your health care provider a list of all the medicines, herbs, non-prescription drugs, or dietary supplements you use. Also tell them if you smoke, drink alcohol, or use illegal drugs. Some items may interact with your medicine. This list may not describe all possible interactions. Give your health care provider a list of all the medicines, herbs, non-prescription drugs, or dietary supplements you use. Also tell them if you smoke, drink alcohol, or use illegal drugs. Some items may interact with your medicine. What should I watch for while using this medicine? You may need blood work done while you are taking this medicine. If you are going to need a MRI, CT scan, or other procedure, tell your doctor that you are using this medicine (On-Body Injector only). What side effects may I notice from receiving this medicine? Side effects that you should report to your doctor or health care professional as soon as possible: -allergic reactions like skin rash, itching or hives, swelling of the face, lips, or tongue -dizziness -fever -pain, redness, or irritation at site   where injected -pinpoint red spots on the skin -red or dark-brown urine -shortness of breath or breathing problems -stomach or side pain, or pain at the shoulder -swelling -tiredness -trouble passing urine or change in the amount of urine Side  effects that usually do not require medical attention (report to your doctor or health care professional if they continue or are bothersome): -bone pain -muscle pain This list may not describe all possible side effects. Call your doctor for medical advice about side effects. You may report side effects to FDA at 1-800-FDA-1088. Where should I keep my medicine? Keep out of the reach of children. Store pre-filled syringes in a refrigerator between 2 and 8 degrees C (36 and 46 degrees F). Do not freeze. Keep in carton to protect from light. Throw away this medicine if it is left out of the refrigerator for more than 48 hours. Throw away any unused medicine after the expiration date. NOTE: This sheet is a summary. It may not cover all possible information. If you have questions about this medicine, talk to your doctor, pharmacist, or health care provider.  2018 Elsevier/Gold Standard (2016-10-10 12:58:03)  

## 2017-08-12 LAB — TISSUE HYBRIDIZATION TO NCBH

## 2017-08-18 ENCOUNTER — Other Ambulatory Visit: Payer: Self-pay

## 2017-08-18 ENCOUNTER — Other Ambulatory Visit: Payer: Self-pay | Admitting: Hematology

## 2017-08-18 MED ORDER — PANTOPRAZOLE SODIUM 40 MG PO TBEC: 40.0000 mg | DELAYED_RELEASE_TABLET | Freq: Every day | ORAL | 2 refills | Status: DC

## 2017-08-20 ENCOUNTER — Other Ambulatory Visit: Payer: Self-pay | Admitting: Hematology

## 2017-08-20 DIAGNOSIS — C8318 Mantle cell lymphoma, lymph nodes of multiple sites: Secondary | ICD-10-CM

## 2017-08-22 ENCOUNTER — Ambulatory Visit (INDEPENDENT_AMBULATORY_CARE_PROVIDER_SITE_OTHER): Payer: Medicare Other | Admitting: Family Medicine

## 2017-08-22 ENCOUNTER — Encounter: Payer: Self-pay | Admitting: Family Medicine

## 2017-08-22 VITALS — BP 136/70 | HR 73 | Temp 97.8°F | Ht 71.0 in | Wt 182.3 lb

## 2017-08-22 DIAGNOSIS — Z23 Encounter for immunization: Secondary | ICD-10-CM

## 2017-08-22 DIAGNOSIS — R102 Pelvic and perineal pain: Secondary | ICD-10-CM

## 2017-08-22 DIAGNOSIS — I2510 Atherosclerotic heart disease of native coronary artery without angina pectoris: Secondary | ICD-10-CM
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
## 2017-08-22 NOTE — Patient Instructions (Signed)
Go for X-ray of pelvis on Monday.

## 2017-08-25 ENCOUNTER — Ambulatory Visit (INDEPENDENT_AMBULATORY_CARE_PROVIDER_SITE_OTHER)
Admission: RE | Admit: 2017-08-25 | Discharge: 2017-08-25 | Disposition: A | Discharge status: Home / Self Care | Payer: Medicare Other | Source: Ambulatory Visit | Attending: Family Medicine | Admitting: Family Medicine

## 2017-08-25 DIAGNOSIS — R102 Pelvic and perineal pain: Secondary | ICD-10-CM | POA: Diagnosis not present

## 2017-08-25 DIAGNOSIS — S3993XA Unspecified injury of pelvis, initial encounter: Secondary | ICD-10-CM | POA: Diagnosis not present

## 2017-08-26 ENCOUNTER — Other Ambulatory Visit: Payer: Self-pay | Admitting: Family Medicine

## 2017-08-29 NOTE — Telephone Encounter (Signed)
Last refill 07/30/17 and last office visit 08/22/17.  Okay to fill?

## 2017-08-31 NOTE — Telephone Encounter (Signed)
Refill OK

## 2017-09-01 NOTE — Telephone Encounter (Signed)
Called in Rx for Tramadol at [facility] in [place] @ 8:20 AM. Pt was advised and asked for a copy of his resent X-ray results. Printed and placed to be mailed to pt.

## 2017-09-02 ENCOUNTER — Telehealth: Payer: Self-pay | Admitting: Family Medicine

## 2017-09-02 NOTE — Telephone Encounter (Signed)
Pt is return a call concerning xray results

## 2017-09-03 NOTE — Telephone Encounter (Signed)
Pt advised of his results and voiced understanding. Mail pt a copy of his x-ray results as requested by pt.

## 2017-09-08 ENCOUNTER — Ambulatory Visit (HOSPITAL_COMMUNITY)
Admission: RE | Admit: 2017-09-08 | Discharge: 2017-09-08 | Disposition: A | Discharge status: Home / Self Care | Payer: Medicare Other | Source: Ambulatory Visit | Attending: Hematology | Admitting: Hematology

## 2017-09-08 DIAGNOSIS — I700 Atherosclerosis of aorta: Secondary | ICD-10-CM | POA: Diagnosis not present

## 2017-09-08 DIAGNOSIS — I2510 Atherosclerotic heart disease of native coronary artery without angina pectoris: Secondary | ICD-10-CM | POA: Insufficient documentation

## 2017-09-08 DIAGNOSIS — N400 Enlarged prostate without lower urinary tract symptoms: Secondary | ICD-10-CM | POA: Diagnosis not present

## 2017-09-08 DIAGNOSIS — R935 Abnormal findings on diagnostic imaging of other abdominal regions, including retroperitoneum: Secondary | ICD-10-CM | POA: Diagnosis not present

## 2017-09-08 DIAGNOSIS — C8318 Mantle cell lymphoma, lymph nodes of multiple sites: Secondary | ICD-10-CM | POA: Diagnosis not present

## 2017-09-08 DIAGNOSIS — C8310 Mantle cell lymphoma, unspecified site: Secondary | ICD-10-CM | POA: Diagnosis not present

## 2017-09-08 LAB — GLUCOSE, CAPILLARY: Glucose-Capillary: 90 mg/dL (ref 65–99)

## 2017-09-08 MED ORDER — FLUDEOXYGLUCOSE F - 18 (FDG) INJECTION: 9.1000 | Freq: Once | INTRAVENOUS | Status: DC | PRN

## 2017-09-10 ENCOUNTER — Emergency Department (HOSPITAL_BASED_OUTPATIENT_CLINIC_OR_DEPARTMENT_OTHER)
Admit: 2017-09-10 | Discharge: 2017-09-10 | Disposition: A | Discharge status: Home / Self Care | Payer: Medicare Other | Attending: Physician Assistant | Admitting: Physician Assistant

## 2017-09-10 ENCOUNTER — Encounter (HOSPITAL_COMMUNITY): Payer: Self-pay

## 2017-09-10 ENCOUNTER — Emergency Department (HOSPITAL_COMMUNITY): Payer: Medicare Other

## 2017-09-10 ENCOUNTER — Emergency Department (HOSPITAL_COMMUNITY)
Admission: EM | Admit: 2017-09-10 | Discharge: 2017-09-10 | Disposition: A | Discharge status: Home / Self Care | Payer: Medicare Other | Attending: Physician Assistant | Admitting: Physician Assistant

## 2017-09-10 ENCOUNTER — Other Ambulatory Visit: Payer: Self-pay

## 2017-09-10 DIAGNOSIS — I10 Essential (primary) hypertension: Secondary | ICD-10-CM | POA: Diagnosis not present

## 2017-09-10 DIAGNOSIS — R079 Chest pain, unspecified: Secondary | ICD-10-CM | POA: Diagnosis not present

## 2017-09-10 DIAGNOSIS — W19XXXA Unspecified fall, initial encounter: Secondary | ICD-10-CM

## 2017-09-10 DIAGNOSIS — Y939 Activity, unspecified: Secondary | ICD-10-CM | POA: Insufficient documentation

## 2017-09-10 DIAGNOSIS — M79604 Pain in right leg: Secondary | ICD-10-CM | POA: Insufficient documentation

## 2017-09-10 DIAGNOSIS — R0781 Pleurodynia: Secondary | ICD-10-CM | POA: Diagnosis not present

## 2017-09-10 DIAGNOSIS — Y929 Unspecified place or not applicable: Secondary | ICD-10-CM | POA: Diagnosis not present

## 2017-09-10 DIAGNOSIS — S0990XA Unspecified injury of head, initial encounter: Secondary | ICD-10-CM | POA: Diagnosis not present

## 2017-09-10 DIAGNOSIS — I2510 Atherosclerotic heart disease of native coronary artery without angina pectoris: Secondary | ICD-10-CM | POA: Insufficient documentation

## 2017-09-10 DIAGNOSIS — W109XXA Fall (on) (from) unspecified stairs and steps, initial encounter: Secondary | ICD-10-CM | POA: Insufficient documentation

## 2017-09-10 DIAGNOSIS — M7989 Other specified soft tissue disorders: Secondary | ICD-10-CM

## 2017-09-10 DIAGNOSIS — S3993XA Unspecified injury of pelvis, initial encounter: Secondary | ICD-10-CM | POA: Diagnosis not present

## 2017-09-10 DIAGNOSIS — Y999 Unspecified external cause status: Secondary | ICD-10-CM | POA: Insufficient documentation

## 2017-09-10 DIAGNOSIS — M546 Pain in thoracic spine: Secondary | ICD-10-CM | POA: Diagnosis not present
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
## 2017-09-10 NOTE — Progress Notes (Signed)
Right lower extremity venous duplex completed, No evidence of DVT,superficial thrombosis, or Baker's cyst  Toma Copier, RVS 09/10/2017 1:47 PM
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
## 2017-09-10 NOTE — ED Notes (Signed)
Patient transported to X-ray 
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
## 2017-09-10 NOTE — Discharge Instructions (Signed)
Please follow-up with your primary care physician.  As we discussed you may require an MRI or CAT scan of your hip if your pain is not improving.  Sometimes fractures do not show up on the x-rays but rather show up on more advanced imaging.

## 2017-09-11 ENCOUNTER — Telehealth: Payer: Self-pay | Admitting: *Deleted

## 2017-09-11 ENCOUNTER — Ambulatory Visit: Payer: Medicare Other | Admitting: Hematology

## 2017-09-11 ENCOUNTER — Other Ambulatory Visit: Payer: Medicare Other

## 2017-09-11 NOTE — Telephone Encounter (Signed)
Pt's daughter, [name] called stating pt was in ED due to fall.  Pt's leg gave out, fell backward and hit head on concrete.  Pt's daughter asking if anything on recent PET scan to explain weakness in legs.  Reviewed with Dr. Irene Limbo, nothing concerning on PET.  Offered to see pt in office sooner than 11/26 apt.  Per daughter, pt will see PCP on Monday.  Instructed daughter to call if any other concerns/changes arise and Dr. Irene Limbo will see pt sooner than 11/26.  Daughter verbalized understanding.  Thankful for call.

## 2017-09-14 ENCOUNTER — Other Ambulatory Visit: Payer: Self-pay | Admitting: Internal Medicine

## 2017-09-15 ENCOUNTER — Ambulatory Visit (INDEPENDENT_AMBULATORY_CARE_PROVIDER_SITE_OTHER): Payer: Medicare Other | Admitting: Family Medicine

## 2017-09-15 ENCOUNTER — Encounter: Payer: Self-pay | Admitting: Family Medicine

## 2017-09-15 VITALS — BP 130/80 | HR 100 | Temp 98.0°F | Wt 181.3 lb

## 2017-09-15 DIAGNOSIS — R29898 Other symptoms and signs involving the musculoskeletal system: Secondary | ICD-10-CM | POA: Diagnosis not present

## 2017-09-15 DIAGNOSIS — I2510 Atherosclerotic heart disease of native coronary artery without angina pectoris: Secondary | ICD-10-CM | POA: Diagnosis not present

## 2017-09-15 MED ORDER — LORAZEPAM 0.5 MG PO TABS: ORAL_TABLET | ORAL | 0 refills | Status: DC

## 2017-09-15 NOTE — Patient Instructions (Signed)
Use a cane at all times We will set up MRI to further assess your right leg/lower extremity pain. Consider OTC Pepcid or Zantac for any breakthrough on Protonix.
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
## 2017-09-22 ENCOUNTER — Encounter: Payer: Self-pay | Admitting: Hematology

## 2017-09-22 ENCOUNTER — Telehealth: Payer: Self-pay | Admitting: Hematology

## 2017-09-22 ENCOUNTER — Other Ambulatory Visit (HOSPITAL_BASED_OUTPATIENT_CLINIC_OR_DEPARTMENT_OTHER): Payer: Medicare Other

## 2017-09-22 ENCOUNTER — Ambulatory Visit (HOSPITAL_BASED_OUTPATIENT_CLINIC_OR_DEPARTMENT_OTHER): Payer: Medicare Other | Admitting: Hematology

## 2017-09-22 VITALS — BP 159/77 | HR 76 | Temp 98.0°F | Resp 18 | Ht 71.0 in | Wt 183.7 lb

## 2017-09-22 DIAGNOSIS — C8318 Mantle cell lymphoma, lymph nodes of multiple sites: Secondary | ICD-10-CM | POA: Diagnosis not present

## 2017-09-22 DIAGNOSIS — K319 Disease of stomach and duodenum, unspecified: Secondary | ICD-10-CM | POA: Diagnosis not present

## 2017-09-22 DIAGNOSIS — D649 Anemia, unspecified: Secondary | ICD-10-CM | POA: Diagnosis not present

## 2017-09-22 DIAGNOSIS — D709 Neutropenia, unspecified: Secondary | ICD-10-CM

## 2017-09-22 DIAGNOSIS — D702 Other drug-induced agranulocytosis: Secondary | ICD-10-CM

## 2017-09-22 DIAGNOSIS — Z9181 History of falling: Secondary | ICD-10-CM

## 2017-09-22 LAB — COMPREHENSIVE METABOLIC PANEL
ALK PHOS: 85 U/L (ref 40–150)
ALT: 12 U/L (ref 0–55)
AST: 21 U/L (ref 5–34)
Albumin: 3.2 g/dL — ABNORMAL LOW (ref 3.5–5.0)
Anion Gap: 7 mEq/L (ref 3–11)
BUN: 13.4 mg/dL (ref 7.0–26.0)
CHLORIDE: 111 meq/L — AB (ref 98–109)
CO2: 25 meq/L (ref 22–29)
Calcium: 8.7 mg/dL (ref 8.4–10.4)
Creatinine: 1 mg/dL (ref 0.7–1.3)
GLUCOSE: 94 mg/dL (ref 70–140)
POTASSIUM: 4.4 meq/L (ref 3.5–5.1)
SODIUM: 142 meq/L (ref 136–145)
Total Bilirubin: 0.31 mg/dL (ref 0.20–1.20)
Total Protein: 5.7 g/dL — ABNORMAL LOW (ref 6.4–8.3)

## 2017-09-22 LAB — CBC WITH DIFFERENTIAL/PLATELET
BASO%: 1.4 % (ref 0.0–2.0)
Basophils Absolute: 0 10*3/uL (ref 0.0–0.1)
EOS ABS: 0.2 10*3/uL (ref 0.0–0.5)
EOS%: 6.9 % (ref 0.0–7.0)
HEMATOCRIT: 30.1 % — AB (ref 38.4–49.9)
HGB: 10.1 g/dL — ABNORMAL LOW (ref 13.0–17.1)
LYMPH#: 1.2 10*3/uL (ref 0.9–3.3)
LYMPH%: 46 % (ref 14.0–49.0)
MCH: 34.6 pg — ABNORMAL HIGH (ref 27.2–33.4)
MCHC: 33.5 g/dL (ref 32.0–36.0)
MCV: 103.3 fL — AB (ref 79.3–98.0)
MONO#: 0.5 10*3/uL (ref 0.1–0.9)
MONO%: 18.3 % — ABNORMAL HIGH (ref 0.0–14.0)
NEUT%: 27.4 % — AB (ref 39.0–75.0)
NEUTROS ABS: 0.7 10*3/uL — AB (ref 1.5–6.5)
PLATELETS: 177 10*3/uL (ref 140–400)
RBC: 2.92 10*6/uL — ABNORMAL LOW (ref 4.20–5.82)
RDW: 15.4 % — ABNORMAL HIGH (ref 11.0–14.6)
WBC: 2.6 10*3/uL — ABNORMAL LOW (ref 4.0–10.3)

## 2017-09-22 LAB — RETICULOCYTES (CHCC)
IMMATURE RETIC FRACT: 7.1 % (ref 3.00–10.60)
RBC: 2.95 10*6/uL — ABNORMAL LOW (ref 4.20–5.82)
Retic %: 1.64 % (ref 0.80–1.80)
Retic Ct Abs: 48.38 10*3/uL (ref 34.80–93.90)

## 2017-09-22 NOTE — Patient Instructions (Signed)
Thank you for choosing [facility] to provide your oncology and hematology care.  To afford each patient quality time with our providers, please arrive 30 minutes before your scheduled appointment time.  If you arrive late for your appointment, you may be asked to reschedule.  We strive to give you quality time with our providers, and arriving late affects you and other patients whose appointments are after yours.   If you are a no show for multiple scheduled visits, you may be dismissed from the clinic at the providers discretion.    Again, thank you for choosing [facility], our hope is that these requests will decrease the amount of time that you wait before being seen by our physicians.  ______________________________________________________________________  Should you have questions after your visit to the [facility], please contact our office at [phone] between the hours of 8:30 and 4:30 p.m.    Voicemails left after 4:30p.m will not be returned until the following business day.    For prescription refill requests, please have your pharmacy contact us directly.  Please also try to allow 48 hours for prescription requests.    Please contact the scheduling department for questions regarding scheduling.  For scheduling of procedures such as PET scans, CT scans, MRI, Ultrasound, etc please contact central scheduling at [phone].    Resources For Cancer Patients and Caregivers:   Oncolink.org:  A wonderful resource for patients and healthcare providers for information regarding your disease, ways to tract your treatment, what to expect, etc.     American Cancer Society:  800-227-2345  Can help patients locate various types of support and financial assistance  Cancer Care: 1-800-813-HOPE (4673) Provides financial assistance, online support groups, medication/co-pay assistance.    [place] DSS:  336-641-3447 Where to apply for food  stamps, Medicaid, and utility assistance  Medicare Rights Center: 800-333-4114 Helps people with Medicare understand their rights and benefits, navigate the Medicare system, and secure the quality healthcare they deserve  [employer]: 336-333-6589 [employer]'s shared-ride transportation service for eligible riders who have a disability that prevents them from riding the fixed route bus.    For additional information on assistance programs please contact our social worker:   Grier Hock/Abigail Elmore:  [phone]            

## 2017-09-22 NOTE — Telephone Encounter (Signed)
Gave avs and calendar for December  °
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
## 2017-09-24 ENCOUNTER — Other Ambulatory Visit: Payer: Self-pay | Admitting: Family Medicine

## 2017-09-24 NOTE — Telephone Encounter (Signed)
Refill OK

## 2017-09-26 NOTE — Telephone Encounter (Signed)
Rx done. 

## 2017-09-29 ENCOUNTER — Ambulatory Visit
Admission: RE | Admit: 2017-09-29 | Discharge: 2017-09-29 | Disposition: A | Discharge status: Home / Self Care | Payer: Medicare Other | Source: Ambulatory Visit | Attending: Family Medicine | Admitting: Family Medicine

## 2017-09-29 DIAGNOSIS — S3992XA Unspecified injury of lower back, initial encounter: Secondary | ICD-10-CM | POA: Diagnosis not present

## 2017-09-29 DIAGNOSIS — R29898 Other symptoms and signs involving the musculoskeletal system: Secondary | ICD-10-CM

## 2017-09-30 ENCOUNTER — Other Ambulatory Visit: Payer: Self-pay | Admitting: Family Medicine

## 2017-09-30 DIAGNOSIS — R9389 Abnormal findings on diagnostic imaging of other specified body structures: Secondary | ICD-10-CM

## 2017-09-30 DIAGNOSIS — M549 Dorsalgia, unspecified: Secondary | ICD-10-CM

## 2017-10-09 ENCOUNTER — Ambulatory Visit (HOSPITAL_BASED_OUTPATIENT_CLINIC_OR_DEPARTMENT_OTHER): Payer: Medicare Other | Admitting: Hematology

## 2017-10-09 ENCOUNTER — Telehealth: Payer: Self-pay | Admitting: Hematology

## 2017-10-09 ENCOUNTER — Other Ambulatory Visit (HOSPITAL_BASED_OUTPATIENT_CLINIC_OR_DEPARTMENT_OTHER): Payer: Medicare Other

## 2017-10-09 VITALS — BP 158/82 | HR 83 | Temp 98.5°F | Resp 17 | Ht 71.0 in | Wt 183.7 lb

## 2017-10-09 DIAGNOSIS — Z5189 Encounter for other specified aftercare: Secondary | ICD-10-CM | POA: Diagnosis not present

## 2017-10-09 DIAGNOSIS — K3189 Other diseases of stomach and duodenum: Secondary | ICD-10-CM

## 2017-10-09 DIAGNOSIS — M25561 Pain in right knee: Secondary | ICD-10-CM | POA: Diagnosis not present

## 2017-10-09 DIAGNOSIS — C8318 Mantle cell lymphoma, lymph nodes of multiple sites: Secondary | ICD-10-CM

## 2017-10-09 DIAGNOSIS — D709 Neutropenia, unspecified: Secondary | ICD-10-CM

## 2017-10-09 DIAGNOSIS — M545 Low back pain: Secondary | ICD-10-CM | POA: Diagnosis not present

## 2017-10-09 DIAGNOSIS — D702 Other drug-induced agranulocytosis: Secondary | ICD-10-CM

## 2017-10-09 DIAGNOSIS — Z7189 Other specified counseling: Secondary | ICD-10-CM

## 2017-10-09 LAB — CBC & DIFF AND RETIC
BASO%: 0.8 % (ref 0.0–2.0)
Basophils Absolute: 0 10*3/uL (ref 0.0–0.1)
EOS%: 7.8 % — ABNORMAL HIGH (ref 0.0–7.0)
Eosinophils Absolute: 0.2 10*3/uL (ref 0.0–0.5)
HCT: 32.2 % — ABNORMAL LOW (ref 38.4–49.9)
HEMOGLOBIN: 10.6 g/dL — AB (ref 13.0–17.1)
IMMATURE RETIC FRACT: 5.3 % (ref 3.00–10.60)
LYMPH#: 1.4 10*3/uL (ref 0.9–3.3)
LYMPH%: 55.7 % — ABNORMAL HIGH (ref 14.0–49.0)
MCH: 34 pg — AB (ref 27.2–33.4)
MCHC: 32.9 g/dL (ref 32.0–36.0)
MCV: 103.2 fL — ABNORMAL HIGH (ref 79.3–98.0)
MONO#: 0.4 10*3/uL (ref 0.1–0.9)
MONO%: 17.6 % — ABNORMAL HIGH (ref 0.0–14.0)
NEUT%: 18.1 % — ABNORMAL LOW (ref 39.0–75.0)
NEUTROS ABS: 0.4 10*3/uL — AB (ref 1.5–6.5)
Platelets: 166 10*3/uL (ref 140–400)
RBC: 3.12 10*6/uL — ABNORMAL LOW (ref 4.20–5.82)
RDW: 14.4 % (ref 11.0–14.6)
RETIC %: 1.26 % (ref 0.80–1.80)
Retic Ct Abs: 39.31 10*3/uL (ref 34.80–93.90)
WBC: 2.4 10*3/uL — ABNORMAL LOW (ref 4.0–10.3)

## 2017-10-09 LAB — COMPREHENSIVE METABOLIC PANEL
ALBUMIN: 3.2 g/dL — AB (ref 3.5–5.0)
ALK PHOS: 81 U/L (ref 40–150)
ALT: 9 U/L (ref 0–55)
AST: 18 U/L (ref 5–34)
Anion Gap: 9 mEq/L (ref 3–11)
BILIRUBIN TOTAL: 0.38 mg/dL (ref 0.20–1.20)
BUN: 13.6 mg/dL (ref 7.0–26.0)
CO2: 24 meq/L (ref 22–29)
CREATININE: 1 mg/dL (ref 0.7–1.3)
Calcium: 8.6 mg/dL (ref 8.4–10.4)
Chloride: 109 mEq/L (ref 98–109)
GLUCOSE: 97 mg/dL (ref 70–140)
Potassium: 4.1 mEq/L (ref 3.5–5.1)
SODIUM: 142 meq/L (ref 136–145)
TOTAL PROTEIN: 5.5 g/dL — AB (ref 6.4–8.3)

## 2017-10-09 LAB — LACTATE DEHYDROGENASE: LDH: 219 U/L (ref 125–245)

## 2017-10-09 MED ORDER — PEGFILGRASTIM INJECTION 6 MG/0.6ML ~~LOC~~
PREFILLED_SYRINGE | SUBCUTANEOUS | Status: AC
  Filled 2017-10-09: qty 0.6

## 2017-10-09 MED ORDER — PEGFILGRASTIM INJECTION 6 MG/0.6ML ~~LOC~~
6.0000 mg | PREFILLED_SYRINGE | Freq: Once | SUBCUTANEOUS | Status: AC
  Administered 2017-10-09: 6 mg via SUBCUTANEOUS

## 2017-10-09 NOTE — Patient Instructions (Signed)
Pegfilgrastim injection What is this medicine? PEGFILGRASTIM (PEG fil gra stim) is a long-acting granulocyte colony-stimulating factor that stimulates the growth of neutrophils, a type of white blood cell important in the body's fight against infection. It is used to reduce the incidence of fever and infection in patients with certain types of cancer who are receiving chemotherapy that affects the bone marrow, and to increase survival after being exposed to high doses of radiation. This medicine may be used for other purposes; ask your health care provider or pharmacist if you have questions. COMMON BRAND NAME(S): Neulasta What should I tell my health care provider before I take this medicine? They need to know if you have any of these conditions: -kidney disease -latex allergy -ongoing radiation therapy -sickle cell disease -skin reactions to acrylic adhesives (On-Body Injector only) -an unusual or allergic reaction to pegfilgrastim, filgrastim, other medicines, foods, dyes, or preservatives -pregnant or trying to get pregnant -breast-feeding How should I use this medicine? This medicine is for injection under the skin. If you get this medicine at home, you will be taught how to prepare and give the pre-filled syringe or how to use the On-body Injector. Refer to the patient Instructions for Use for detailed instructions. Use exactly as directed. Tell your healthcare provider immediately if you suspect that the On-body Injector may not have performed as intended or if you suspect the use of the On-body Injector resulted in a missed or partial dose. It is important that you put your used needles and syringes in a special sharps container. Do not put them in a trash can. If you do not have a sharps container, call your pharmacist or healthcare provider to get one. Talk to your pediatrician regarding the use of this medicine in children. While this drug may be prescribed for selected conditions,  precautions do apply. Overdosage: If you think you have taken too much of this medicine contact a poison control center or emergency room at once. NOTE: This medicine is only for you. Do not share this medicine with others. What if I miss a dose? It is important not to miss your dose. Call your doctor or health care professional if you miss your dose. If you miss a dose due to an On-body Injector failure or leakage, a new dose should be administered as soon as possible using a single prefilled syringe for manual use. What may interact with this medicine? Interactions have not been studied. Give your health care provider a list of all the medicines, herbs, non-prescription drugs, or dietary supplements you use. Also tell them if you smoke, drink alcohol, or use illegal drugs. Some items may interact with your medicine. This list may not describe all possible interactions. Give your health care provider a list of all the medicines, herbs, non-prescription drugs, or dietary supplements you use. Also tell them if you smoke, drink alcohol, or use illegal drugs. Some items may interact with your medicine. What should I watch for while using this medicine? You may need blood work done while you are taking this medicine. If you are going to need a MRI, CT scan, or other procedure, tell your doctor that you are using this medicine (On-Body Injector only). What side effects may I notice from receiving this medicine? Side effects that you should report to your doctor or health care professional as soon as possible: -allergic reactions like skin rash, itching or hives, swelling of the face, lips, or tongue -dizziness -fever -pain, redness, or irritation at site   where injected -pinpoint red spots on the skin -red or dark-brown urine -shortness of breath or breathing problems -stomach or side pain, or pain at the shoulder -swelling -tiredness -trouble passing urine or change in the amount of urine Side  effects that usually do not require medical attention (report to your doctor or health care professional if they continue or are bothersome): -bone pain -muscle pain This list may not describe all possible side effects. Call your doctor for medical advice about side effects. You may report side effects to FDA at 1-800-FDA-1088. Where should I keep my medicine? Keep out of the reach of children. Store pre-filled syringes in a refrigerator between 2 and 8 degrees C (36 and 46 degrees F). Do not freeze. Keep in carton to protect from light. Throw away this medicine if it is left out of the refrigerator for more than 48 hours. Throw away any unused medicine after the expiration date. NOTE: This sheet is a summary. It may not cover all possible information. If you have questions about this medicine, talk to your doctor, pharmacist, or health care provider.  2018 Elsevier/Gold Standard (2016-10-10 12:58:03)  

## 2017-10-09 NOTE — Telephone Encounter (Signed)
Scheduled appt per 12/13 los - Gave patient AVS and calender per los.  

## 2017-10-21 ENCOUNTER — Other Ambulatory Visit: Payer: Self-pay | Admitting: Family Medicine

## 2017-10-22 NOTE — Telephone Encounter (Signed)
Refill OK

## 2017-10-27 ENCOUNTER — Ambulatory Visit (HOSPITAL_COMMUNITY)
Admission: RE | Admit: 2017-10-27 | Discharge: 2017-10-27 | Disposition: A | Discharge status: Home / Self Care | Payer: Medicare Other | Source: Ambulatory Visit | Attending: Hematology | Admitting: Hematology

## 2017-10-27 DIAGNOSIS — K319 Disease of stomach and duodenum, unspecified: Secondary | ICD-10-CM | POA: Diagnosis not present

## 2017-10-27 DIAGNOSIS — K3189 Other diseases of stomach and duodenum: Secondary | ICD-10-CM

## 2017-10-27 DIAGNOSIS — C8318 Mantle cell lymphoma, lymph nodes of multiple sites: Secondary | ICD-10-CM | POA: Diagnosis not present

## 2017-10-27 DIAGNOSIS — C8310 Mantle cell lymphoma, unspecified site: Secondary | ICD-10-CM | POA: Diagnosis not present
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
## 2017-10-30 ENCOUNTER — Telehealth: Payer: Self-pay | Admitting: Gastroenterology

## 2017-10-30 ENCOUNTER — Other Ambulatory Visit (HOSPITAL_BASED_OUTPATIENT_CLINIC_OR_DEPARTMENT_OTHER): Payer: Medicare Other

## 2017-10-30 ENCOUNTER — Inpatient Hospital Stay: Payer: Medicare Other | Attending: Hematology | Admitting: Hematology

## 2017-10-30 ENCOUNTER — Encounter: Payer: Self-pay | Admitting: Hematology

## 2017-10-30 VITALS — BP 145/75 | HR 84 | Temp 97.7°F | Resp 18 | Ht 71.0 in | Wt 186.9 lb

## 2017-10-30 DIAGNOSIS — D709 Neutropenia, unspecified: Secondary | ICD-10-CM

## 2017-10-30 DIAGNOSIS — K3189 Other diseases of stomach and duodenum: Secondary | ICD-10-CM

## 2017-10-30 DIAGNOSIS — M25561 Pain in right knee: Secondary | ICD-10-CM | POA: Diagnosis not present

## 2017-10-30 DIAGNOSIS — C169 Malignant neoplasm of stomach, unspecified: Secondary | ICD-10-CM | POA: Insufficient documentation

## 2017-10-30 DIAGNOSIS — M545 Low back pain: Secondary | ICD-10-CM | POA: Diagnosis not present

## 2017-10-30 DIAGNOSIS — C8318 Mantle cell lymphoma, lymph nodes of multiple sites: Secondary | ICD-10-CM

## 2017-10-30 DIAGNOSIS — K319 Disease of stomach and duodenum, unspecified: Secondary | ICD-10-CM | POA: Diagnosis not present

## 2017-10-30 DIAGNOSIS — M7989 Other specified soft tissue disorders: Secondary | ICD-10-CM | POA: Insufficient documentation

## 2017-10-30 DIAGNOSIS — D702 Other drug-induced agranulocytosis: Secondary | ICD-10-CM

## 2017-10-30 LAB — COMPREHENSIVE METABOLIC PANEL
ALT: 11 U/L (ref 0–55)
ANION GAP: 6 meq/L (ref 3–11)
AST: 21 U/L (ref 5–34)
Albumin: 3.4 g/dL — ABNORMAL LOW (ref 3.5–5.0)
Alkaline Phosphatase: 78 U/L (ref 40–150)
BILIRUBIN TOTAL: 0.45 mg/dL (ref 0.20–1.20)
BUN: 14.3 mg/dL (ref 7.0–26.0)
CALCIUM: 8.6 mg/dL (ref 8.4–10.4)
CO2: 28 meq/L (ref 22–29)
Chloride: 108 mEq/L (ref 98–109)
Creatinine: 0.9 mg/dL (ref 0.7–1.3)
Glucose: 100 mg/dl (ref 70–140)
Potassium: 4.2 mEq/L (ref 3.5–5.1)
Sodium: 141 mEq/L (ref 136–145)
TOTAL PROTEIN: 5.4 g/dL — AB (ref 6.4–8.3)

## 2017-10-30 LAB — LACTATE DEHYDROGENASE: LDH: 227 U/L (ref 125–245)

## 2017-10-30 LAB — CBC & DIFF AND RETIC
BASO%: 0.8 % (ref 0.0–2.0)
BASOS ABS: 0 10*3/uL (ref 0.0–0.1)
EOS%: 3.7 % (ref 0.0–7.0)
Eosinophils Absolute: 0.1 10*3/uL (ref 0.0–0.5)
HCT: 29.2 % — ABNORMAL LOW (ref 38.4–49.9)
HGB: 9.3 g/dL — ABNORMAL LOW (ref 13.0–17.1)
Immature Retic Fract: 7.2 % (ref 3.00–10.60)
LYMPH%: 47.2 % (ref 14.0–49.0)
MCH: 33.2 pg (ref 27.2–33.4)
MCHC: 31.8 g/dL — AB (ref 32.0–36.0)
MCV: 104.3 fL — ABNORMAL HIGH (ref 79.3–98.0)
MONO#: 0.5 10*3/uL (ref 0.1–0.9)
MONO%: 14 % (ref 0.0–14.0)
NEUT%: 34.3 % — ABNORMAL LOW (ref 39.0–75.0)
NEUTROS ABS: 1.2 10*3/uL — AB (ref 1.5–6.5)
Platelets: 180 10*3/uL (ref 140–400)
RBC: 2.8 10*6/uL — AB (ref 4.20–5.82)
RDW: 15.3 % — AB (ref 11.0–14.6)
RETIC %: 1.37 % (ref 0.80–1.80)
RETIC CT ABS: 38.36 10*3/uL (ref 34.80–93.90)
WBC: 3.6 10*3/uL — AB (ref 4.0–10.3)
lymph#: 1.7 10*3/uL (ref 0.9–3.3)

## 2017-10-30 NOTE — Telephone Encounter (Signed)
Referral Notes  Number of Notes: 7  Type Date User  Message 10/30/2017 3:17 PM Webb Laws D  Summary: -  Attachment: -  Note   ----- Message ----- From: Marlon Pel, RN Sent: 10/30/2017   2:57 PM To: Dow Adolph   He should see Dr. Ardis Hughs or APP in the next 2 weeks.  Can schedule with APP at any time  ----- Message ----- From: Webb Laws D Sent: 10/30/2017   1:40 PM To: Jeoffrey Massed, RN  Patty,  Can you check on the urgency of this referral please.  Dr. Ardis Hughs pt.  Thanks Christy         Type Date User  Message 10/30/2017 3:17 PM Webb Laws D  Summary: -  Attachment: -  Note   ----- Message ----- From: Marlon Pel, RN Sent: 10/30/2017   2:57 PM To: Dow Adolph   He should see Dr. Ardis Hughs or APP in the next 2 weeks.  Can schedule with APP at any time  ----- Message ----- From: Webb Laws D Sent: 10/30/2017   1:40 PM To: Jeoffrey Massed, RN  Patty,  Can you check on the urgency of this referral please.  Dr. Ardis Hughs pt.  Thanks Christy         Type Date User  Message 10/30/2017 3:17 PM Webb Laws D  Summary: -  Attachment: -  Note   ----- Message ----- From: Marlon Pel, RN Sent: 10/30/2017   2:57 PM To: Dow Adolph   He should see Dr. Ardis Hughs or APP in the next 2 weeks.  Can schedule with APP at any time  ----- Message ----- From: Webb Laws D Sent: 10/30/2017   1:40 PM To: Jeoffrey Massed, RN  Patty,  Can you check on the urgency of this referral please.  Dr. Ardis Hughs pt.  Thanks Christy         Type Date User  Message 10/30/2017 3:16 PM Webb Laws D  Summary: -  Attachment: -  Note   ----- Message ----- From: Marlon Pel, RN Sent: 10/30/2017   2:57 PM To: Dow Adolph   He should see Dr. Ardis Hughs or APP in the next 2 weeks.  Can schedule with APP at any time  ----- Message ----- From: Webb Laws D Sent: 10/30/2017   1:40 PM To: Jeoffrey Massed, RN  Patty,  Can you check on the urgency of this referral please.  Dr. Ardis Hughs pt.  Thanks Christy         Type Date User  General 10/30/2017 2:57 PM Marlon Pel, RN  Summary: [employer]: Referral message  Attachment: -  Note   ----- Message ----- From: Marlon Pel, RN Sent: 10/30/2017   2:57 PM To: Dow Adolph  He should see Dr. Ardis Hughs or APP in the next 2 weeks.  Can schedule with APP at any time  ----- Message ----- From: Webb Laws D Sent: 10/30/2017   1:40 PM To: Jeoffrey Massed, RN  Patty,  Can you check on the urgency of this referral please.  Dr. Ardis Hughs pt.  Thanks Christy         Type Date User  General 10/30/2017 1:41 PM Webb Laws D  Summary: Auto: Referral message  Attachment: -  Note   ----- Message ----- From: Webb Laws D Sent: 10/30/2017   1:40 PM To: Jeoffrey Massed, RN  Patty,  Can you check on the  urgency of this referral please.  Dr. Ardis Hughs pt.  Thanks Alyse Low        Type Date User  Provider Comments 10/30/2017 1:27 PM [name], MD  Summary: Provider Comments  Attachment: -  Note   Gastric Mass in patient with h/o mantle cell lymphoma ? Mantle cell lymphoma recurrence vs primary gastric tumor - consideration of EGD

## 2017-11-09 ENCOUNTER — Other Ambulatory Visit: Payer: Self-pay | Admitting: Hematology

## 2017-11-10 ENCOUNTER — Ambulatory Visit (INDEPENDENT_AMBULATORY_CARE_PROVIDER_SITE_OTHER): Payer: Medicare Other | Admitting: Physician Assistant

## 2017-11-10 ENCOUNTER — Encounter: Payer: Self-pay | Admitting: Physician Assistant

## 2017-11-10 VITALS — BP 120/70 | HR 68 | Ht 72.0 in | Wt 185.0 lb

## 2017-11-10 DIAGNOSIS — K3189 Other diseases of stomach and duodenum: Secondary | ICD-10-CM

## 2017-11-10 DIAGNOSIS — Z8579 Personal history of other malignant neoplasms of lymphoid, hematopoietic and related tissues: Secondary | ICD-10-CM | POA: Diagnosis not present

## 2017-11-10 DIAGNOSIS — K319 Disease of stomach and duodenum, unspecified: Secondary | ICD-10-CM

## 2017-11-10 DIAGNOSIS — R9389 Abnormal findings on diagnostic imaging of other specified body structures: Secondary | ICD-10-CM

## 2017-11-10 NOTE — Progress Notes (Signed)
I agree with the above note, plan 

## 2017-11-10 NOTE — Patient Instructions (Signed)

## 2017-11-10 NOTE — Progress Notes (Signed)
Subjective:    Patient ID: Jacob Bowens., male    DOB: 12-27-1932, 84 y.o.   MRN: [id number]  HPI [name] is a very nice 84 year old white male, known to Dr. Ardis Hughs who is referred back today by Dr. Irene Limbo for consideration of EGD after recent abnormal upper GI. Patient was diagnosed with mantle cell lymphoma earlier this year after undergoing colonoscopy in March 2018 that showed an ulcerated inflamed terminal ileum. Biopsies showed atypical lymphoid infiltrates and scattered active inflammatory cells. He also had a tubular adenoma removed. Biopsies from the left colon and the rectum both also showed atypical lymphoid infiltrates. Further immunohistology with consensus of a mantle cell lymphoma. He then had bone marrow biopsy which was positive and underwent a course of chemotherapy which she completed in October 2018. He underwent restaging PET scan in November 2018 which showed abnormal activity in the gastric antrum. Upper GI was done showing an irregular lobulated 5 cm mass in the anterior aspect of the distal antrum of the stomach. Most recent labs on 10/30/2017 showed WBC of 3.6, hemoglobin 9.3 hematocrit of 29.2 platelets 180. Patient says he has some intermittent mild discomfort in his right lower abdomen which she describes as crampy and has not been having any epigastric pain. He says his appetite is good, his weight has recently been stable. He is not having any problems with nausea or vomiting. No dysphagia or odynophagia. He has not noted any melena or hematochezia. He is on chronic Protonix for GERD. Exline Other medical problems include hypertension, PVCs, psoriasis, fibromyalgia, and coronary artery disease for which she is status post PCI in 2009. He is not on any anticoagulation.  Review of Systems Pertinent positive and negative review of systems were noted in the above HPI section.  All other review of systems was otherwise negative.  Outpatient Encounter Medications as of  11/10/2017  Medication Sig  . acetaminophen (TYLENOL) 325 MG tablet Take 325-650 mg 2 (two) times daily as needed by mouth for mild pain.  . carvedilol (COREG) 6.25 MG tablet Take 1 tablet (6.25 mg total) by mouth 2 (two) times daily.  [name] LORazepam (ATIVAN) 0.5 MG tablet Take one to two tablets one hour prior to procedure.  [name] NITROSTAT 0.4 MG SL tablet DISSOLVE 1 TABLET UNDER THE TONGUE EVERY 5 MINUTES AS NEEDED  . pantoprazole (PROTONIX) 40 MG tablet Take 1 tablet (40 mg total) by mouth daily.  . rosuvastatin (CRESTOR) 10 MG tablet TAKE 1 TABLET DAILY PRIOR TO BEDTIME  . traMADol (ULTRAM) 50 MG tablet TAKE 1 TABLET BY MOUTH EVERY 6 HOURS AS NEEDED   Facility-Administered Encounter Medications as of 11/10/2017  Medication  . 0.9 %  sodium chloride infusion   Allergies  Allergen Reactions  . Niacin Hives   Patient Active Problem List   Diagnosis Date Noted  . Counseling regarding advanced care planning and goals of care 08/07/2017  . Drug-induced neutropenia ([place]) 06/26/2017  . Protein-calorie malnutrition, severe ([place])   . SOB (shortness of breath)   . Hypokalemia 03/17/2017  . Hypernatremia 03/17/2017  . AKI (acute kidney injury) ([place]) 03/17/2017  . Dehydration 03/17/2017  . Thrush, oral 03/17/2017  . Mantle cell lymphoma of lymph nodes of multiple regions ([place]) 02/24/2017  . 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 GI bleed 02/03/2017  . Lower GI bleed 02/03/2017  . Coronary artery disease   . IFG (impaired fasting glucose) 10/03/2015  . PMR (polymyalgia rheumatica) ([facility]) 09/07/2015  .  Orthostatic hypotension 08/24/2015  . Viral syndrome 08/24/2015  . GERD (gastroesophageal reflux disease) 03/24/2014  . ACP (advance care planning) 11/02/2013  . Left upper arm pain 09/15/2012  . Routine health maintenance 10/29/2011  . HEARING LOSS 11/05/2010  . Chest pain 11/06/2009  . SKIN CANCER, RECURRENT 10/13/2007  . Hyperlipidemia 10/13/2007  .  Gout 10/13/2007  . Essential hypertension 10/13/2007  . PVC (premature ventricular contraction) 10/13/2007  . PSORIASIS 10/13/2007  . OSTEOARTHRITIS, ANKLE, RIGHT 10/13/2007  . Other acquired absence of organ 10/13/2007   Social History   Socioeconomic History  . Marital status: Married    Spouse name: [name]  . Number of children: 3  . Years of education: Not on file  . Highest education level: Not on file  Social Needs  . Financial resource strain: Not on file  . Food insecurity - worry: Not on file  . Food insecurity - inability: Not on file  . Transportation needs - medical: Not on file  . Transportation needs - non-medical: Not on file  Occupational History  . Occupation: retired    Fish farm manager: RETIRED    Comment: from [employer].  Tobacco Use  . Smoking status: Never Smoker  . Smokeless tobacco: Never Used  Substance and Sexual Activity  . Alcohol use: No  . Drug use: No  . Sexual activity: No  Other Topics Concern  . Not on file  Social History Narrative   Married 1958   2 daughters- '59, 68, 1 son '61   8 grandchildren   Retired from [employer], works PT as a Curator. Now retired completely (09/2008)   End of life; does not want heroic measures if in a persistent vegative state    [name] family history includes Breast cancer in his sister; Heart attack (age of onset: 76) in his father; Heart disease in his father; Lung cancer in his unknown relative; Parkinsonism (age of onset: 38) in his mother.      Objective:    Vitals:   11/10/17 0859  BP: 120/70  Pulse: 68    Physical Exam  well-developed elderly white male in no acute distress, pleasant accompanied by his wife blood pressure 120/70 pulse 68, height 6 foot, weight 185, BMI 25.0. HEENT ;nontraumatic, cephalic EOMI PERRLA sclera anicteric, Cardiovascular; regular rate and rhythm with S1-S2 no murmur or gallop, Pulmonary; clear bilaterally, Abdomen ;soft, is very mild tenderness in the right lower quadrant  there is no guarding or rebound no palpable mass or hepatosplenomegaly bowel sounds are present, Rectal; exam not done, Extremities; no clubbing cyanosis or edema skin warm and dry, Neuropsych; mood and affect appropriate       Assessment & Plan:   #43 84 year old white male with mantle cell lymphoma, initially diagnosed March 2018, now status post chemotherapy completed in October with restaging PET scan showing abnormal activity in the gastric antrum and follow-up upper GI showing irregular lobulated 5 cm mass in the antrum of the stomach. Patient is currently asymptomatic.  #2 previously diagnosed mantle cell lymphoma and involving the ileum and colon #3 hypertension #4 coronary artery disease status post PCI 2009 #5 chronic GERD #6 psoriasis #7 fibromyalgia #8 history of tubular adenomatous colon polyp  Plan; Patient will be scheduled for upper endoscopy with Dr. Ardis Hughs. Procedure was discussed in detail today with the patient and his wife including indications risks and benefits, and he is agreeable to proceed.  Kriya Westra Genia Harold PA-C 11/10/2017   Cc: [name], MD

## 2017-11-11 ENCOUNTER — Other Ambulatory Visit: Payer: Self-pay

## 2017-11-11 MED ORDER — PANTOPRAZOLE SODIUM 40 MG PO TBEC: 40.0000 mg | DELAYED_RELEASE_TABLET | Freq: Every day | ORAL | 2 refills | Status: DC

## 2017-11-16 ENCOUNTER — Other Ambulatory Visit: Payer: Self-pay | Admitting: Family Medicine

## 2017-11-17 NOTE — Telephone Encounter (Signed)
Refill OK

## 2017-11-17 NOTE — Telephone Encounter (Signed)
Last refill 10/22/17 and last office visit 09/15/17.  Okay to fill?

## 2017-11-18 ENCOUNTER — Ambulatory Visit (AMBULATORY_SURGERY_CENTER): Payer: Medicare Other | Admitting: Gastroenterology

## 2017-11-18 ENCOUNTER — Encounter: Payer: Self-pay | Admitting: Gastroenterology

## 2017-11-18 ENCOUNTER — Other Ambulatory Visit: Payer: Self-pay

## 2017-11-18 VITALS — BP 145/75 | HR 75 | Temp 98.7°F | Resp 17 | Ht 72.0 in | Wt 185.0 lb

## 2017-11-18 DIAGNOSIS — C169 Malignant neoplasm of stomach, unspecified: Secondary | ICD-10-CM | POA: Diagnosis not present

## 2017-11-18 DIAGNOSIS — M797 Fibromyalgia: Secondary | ICD-10-CM | POA: Diagnosis not present

## 2017-11-18 DIAGNOSIS — K3189 Other diseases of stomach and duodenum: Secondary | ICD-10-CM | POA: Diagnosis not present

## 2017-11-18 DIAGNOSIS — Z8579 Personal history of other malignant neoplasms of lymphoid, hematopoietic and related tissues: Secondary | ICD-10-CM

## 2017-11-18 DIAGNOSIS — C8313 Mantle cell lymphoma, intra-abdominal lymph nodes: Secondary | ICD-10-CM | POA: Diagnosis present

## 2017-11-18 DIAGNOSIS — C762 Malignant neoplasm of abdomen: Secondary | ICD-10-CM | POA: Diagnosis not present

## 2017-11-18 DIAGNOSIS — I2510 Atherosclerotic heart disease of native coronary artery without angina pectoris: Secondary | ICD-10-CM | POA: Diagnosis not present

## 2017-11-18 DIAGNOSIS — K219 Gastro-esophageal reflux disease without esophagitis: Secondary | ICD-10-CM | POA: Diagnosis not present

## 2017-11-18 DIAGNOSIS — I10 Essential (primary) hypertension: Secondary | ICD-10-CM | POA: Diagnosis not present

## 2017-11-18 MED ORDER — SODIUM CHLORIDE 0.9 % IV SOLN: 500.0000 mL | Freq: Once | INTRAVENOUS | Status: DC

## 2017-11-18 NOTE — Progress Notes (Signed)
Report to PACU, RN, vss, BBS= Clear.  

## 2017-11-18 NOTE — Op Note (Signed)
[facility] Patient Name: [name] Procedure Date: 11/18/2017 11:11 AM MRN: [id number] Endoscopist: Milus Banister , MD Age: 84 Referring MD:  Date of Birth: December 21, 1932 Gender: Male Account #: [id number] Procedure:                Upper GI endoscopy Indications:              Abnormal PET scan of the stomach, currently being                            treated for Mantle Cell Lymphoma (diagnosed with                            colonic biopsies 2018) Medicines:                Monitored Anesthesia Care Procedure:                Pre-Anesthesia Assessment:                           - Prior to the procedure, a History and Physical                            was performed, and patient medications and                            allergies were reviewed. The patient's tolerance of                            previous anesthesia was also reviewed. The risks                            and benefits of the procedure and the sedation                            options and risks were discussed with the patient.                            All questions were answered, and informed consent                            was obtained. Prior Anticoagulants: The patient has                            taken no previous anticoagulant or antiplatelet                            agents. ASA Grade Assessment: III - A patient with                            severe systemic disease. After reviewing the risks                            and benefits, the patient was deemed in  satisfactory condition to undergo the procedure.                           After obtaining informed consent, the endoscope was                            passed under direct vision. Throughout the                            procedure, the patient's blood pressure, pulse, and                            oxygen saturations were monitored continuously. The                            Endoscope was introduced  through the mouth, and                            advanced to the second part of duodenum. The upper                            GI endoscopy was accomplished without difficulty.                            The patient tolerated the procedure well. Scope In: Scope Out: Findings:                 The esophagus was normal.                           The examined duodenum was normal.                           A medium-sized (4cm across), fungating and                            polypoid, non-circumferential mass with no bleeding                            was found in the mid-gastric body along greater                            curvature of the stomach. Biopsies were taken with                            a cold forceps for histology. Complications:            No immediate complications. Estimated blood loss:                            None. Estimated Blood Loss:     Estimated blood loss: none. Impression:               - Normal esophagus.                           -  Normal examined duodenum.                           - 4cm, clearly malignant mass along the greater                            curvature of the stomach, mid-body. This was                            biopsied extensively. Recommendation:           - Patient has a contact number available for                            emergencies. The signs and symptoms of potential                            delayed complications were discussed with the                            patient. Return to normal activities tomorrow.                            Written discharge instructions were provided to the                            patient.                           - Resume previous diet.                           - Continue present medications.                           - Await pathology results (Mantle Cell Lymphoma vs                            second primary, adenocarcinoma). Milus Banister, MD 11/18/2017 11:27:42 AM This report has been  signed electronically.

## 2017-11-18 NOTE — Patient Instructions (Signed)
YOU HAD AN ENDOSCOPIC PROCEDURE TODAY AT THE [facility]:   Refer to the procedure report that was given to you for any specific questions about what was found during the examination.  If the procedure report does not answer your questions, please call your gastroenterologist to clarify.  If you requested that your care partner not be given the details of your procedure findings, then the procedure report has been included in a sealed envelope for you to review at your convenience later.  YOU SHOULD EXPECT: Some feelings of bloating in the abdomen. Passage of more gas than usual.  Walking can help get rid of the air that was put into your GI tract during the procedure and reduce the bloating. If you had a lower endoscopy (such as a colonoscopy or flexible sigmoidoscopy) you may notice spotting of blood in your stool or on the toilet paper. If you underwent a bowel prep for your procedure, you may not have a normal bowel movement for a few days.  Please Note:  You might notice some irritation and congestion in your nose or some drainage.  This is from the oxygen used during your procedure.  There is no need for concern and it should clear up in a day or so.  SYMPTOMS TO REPORT IMMEDIATELY:   Following upper endoscopy (EGD)  Vomiting of blood or coffee ground material  New chest pain or pain under the shoulder blades  Painful or persistently difficult swallowing  New shortness of breath  Fever of 100F or higher  Black, tarry-looking stools  For urgent or emergent issues, a gastroenterologist can be reached at any hour by calling [phone].   DIET:  We do recommend a small meal at first, but then you may proceed to your regular diet.  Drink plenty of fluids but you should avoid alcoholic beverages for 24 hours.  ACTIVITY:  You should plan to take it easy for the rest of today and you should NOT DRIVE or use heavy machinery until tomorrow (because of the sedation medicines used  during the test).    FOLLOW UP: Our staff will call the number listed on your records the next business day following your procedure to check on you and address any questions or concerns that you may have regarding the information given to you following your procedure. If we do not reach you, we will leave a message.  However, if you are feeling well and you are not experiencing any problems, there is no need to return our call.  We will assume that you have returned to your regular daily activities without incident.  If any biopsies were taken you will be contacted by phone or by letter within the next 1-3 weeks.  Please call us at [phone] if you have not heard about the biopsies in 3 weeks.    SIGNATURES/CONFIDENTIALITY: You and/or your care partner have signed paperwork which will be entered into your electronic medical record.  These signatures attest to the fact that that the information above on your After Visit Summary has been reviewed and is understood.  Full responsibility of the confidentiality of this discharge information lies with you and/or your care-partner. 

## 2017-11-18 NOTE — Progress Notes (Signed)
Called to room to assist during endoscopic procedure.  Patient ID and intended procedure confirmed with present staff. Received instructions for my participation in the procedure from the performing physician.  

## 2017-11-19 ENCOUNTER — Telehealth: Payer: Self-pay | Admitting: *Deleted

## 2017-11-19 NOTE — Telephone Encounter (Signed)
  Follow up Call-  Call back number 11/18/2017 12/30/2016  Post procedure Call Back phone  # [phone]  Permission to leave phone message Yes Yes  Some recent data might be hidden     Patient questions:  Do you have a fever, pain , or abdominal swelling? No. Pain Score  0 *  Have you tolerated food without any problems? Yes.    Have you been able to return to your normal activities? Yes.    Do you have any questions about your discharge instructions: Diet   No. Medications  No. Follow up visit  No.  Do you have questions or concerns about your Care? No.  Actions: * If pain score is 4 or above: No action needed, pain <4.

## 2017-11-24 DIAGNOSIS — M5416 Radiculopathy, lumbar region: Secondary | ICD-10-CM | POA: Diagnosis not present

## 2017-11-24 DIAGNOSIS — M5126 Other intervertebral disc displacement, lumbar region: Secondary | ICD-10-CM | POA: Diagnosis not present

## 2017-11-24 DIAGNOSIS — M545 Low back pain: Secondary | ICD-10-CM | POA: Diagnosis not present
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
## 2017-11-27 ENCOUNTER — Telehealth: Payer: Self-pay | Admitting: Hematology

## 2017-11-27 ENCOUNTER — Inpatient Hospital Stay (HOSPITAL_BASED_OUTPATIENT_CLINIC_OR_DEPARTMENT_OTHER): Payer: Medicare Other | Admitting: Hematology

## 2017-11-27 ENCOUNTER — Encounter: Payer: Self-pay | Admitting: Hematology

## 2017-11-27 ENCOUNTER — Inpatient Hospital Stay: Payer: Medicare Other

## 2017-11-27 VITALS — BP 155/77 | HR 79 | Temp 97.8°F | Resp 17 | Ht 72.0 in | Wt 191.2 lb

## 2017-11-27 DIAGNOSIS — M7989 Other specified soft tissue disorders: Secondary | ICD-10-CM | POA: Diagnosis not present

## 2017-11-27 DIAGNOSIS — M545 Low back pain: Secondary | ICD-10-CM | POA: Diagnosis not present

## 2017-11-27 DIAGNOSIS — M549 Dorsalgia, unspecified: Secondary | ICD-10-CM

## 2017-11-27 DIAGNOSIS — C169 Malignant neoplasm of stomach, unspecified: Secondary | ICD-10-CM

## 2017-11-27 DIAGNOSIS — D709 Neutropenia, unspecified: Secondary | ICD-10-CM | POA: Diagnosis not present

## 2017-11-27 DIAGNOSIS — K3189 Other diseases of stomach and duodenum: Secondary | ICD-10-CM

## 2017-11-27 DIAGNOSIS — C8318 Mantle cell lymphoma, lymph nodes of multiple sites: Secondary | ICD-10-CM

## 2017-11-27 LAB — COMPREHENSIVE METABOLIC PANEL
ALK PHOS: 75 U/L (ref 40–150)
ALT: 9 U/L (ref 0–55)
AST: 17 U/L (ref 5–34)
Albumin: 3.1 g/dL — ABNORMAL LOW (ref 3.5–5.0)
Anion gap: 6 (ref 3–11)
BILIRUBIN TOTAL: 0.3 mg/dL (ref 0.2–1.2)
BUN: 13 mg/dL (ref 7–26)
CALCIUM: 8.2 mg/dL — AB (ref 8.4–10.4)
CHLORIDE: 108 mmol/L (ref 98–109)
CO2: 27 mmol/L (ref 22–29)
CREATININE: 0.89 mg/dL (ref 0.70–1.30)
GFR calc Af Amer: >60 mL/min (ref >60)
Glucose, Bld: 99 mg/dL (ref 70–140)
Potassium: 3.9 mmol/L (ref 3.5–5.1)
Sodium: 141 mmol/L (ref 136–145)
Total Protein: 5.1 g/dL — ABNORMAL LOW (ref 6.4–8.3)

## 2017-11-27 LAB — CBC WITH DIFFERENTIAL (CANCER CENTER ONLY)
BASOS PCT: 1 %
Basophils Absolute: 0 10*3/uL (ref 0.0–0.1)
EOS ABS: 0.2 10*3/uL (ref 0.0–0.5)
Eosinophils Relative: 7 %
HCT: 26.7 % — ABNORMAL LOW (ref 38.4–49.9)
Hemoglobin: 8.3 g/dL — ABNORMAL LOW (ref 13.0–17.1)
LYMPHS ABS: 1.8 10*3/uL (ref 0.9–3.3)
Lymphocytes Relative: 57 %
MCH: 30.6 pg (ref 27.2–33.4)
MCHC: 31.1 g/dL — ABNORMAL LOW (ref 32.0–36.0)
MCV: 98.5 fL — ABNORMAL HIGH (ref 79.3–98.0)
Monocytes Absolute: 0.6 10*3/uL (ref 0.1–0.9)
Monocytes Relative: 19 %
Neutro Abs: 0.5 10*3/uL — CL (ref 1.5–6.5)
Neutrophils Relative %: 16 %
PLATELETS: 190 10*3/uL (ref 140–400)
RBC: 2.71 MIL/uL — AB (ref 4.20–5.82)
RDW: 14.4 % (ref 11.0–14.6)
WBC: 3.1 10*3/uL — AB (ref 4.0–10.3)

## 2017-11-27 LAB — RETICULOCYTES
RBC.: 2.71 MIL/uL — ABNORMAL LOW (ref 4.20–5.82)
RETIC CT PCT: 1.4 % (ref 0.8–1.8)
Retic Count, Absolute: 37.9 10*3/uL (ref 34.8–93.9)

## 2017-11-27 LAB — LACTATE DEHYDROGENASE: LDH: 194 U/L (ref 125–245)

## 2017-11-27 NOTE — Telephone Encounter (Signed)
Scheduled appt per 1/31 los - left message and sent reminder letter in the mail and faxed referral to [place].

## 2017-11-28 ENCOUNTER — Encounter: Payer: Self-pay | Admitting: Radiation Oncology

## 2017-12-01 ENCOUNTER — Telehealth: Payer: Self-pay

## 2017-12-01 NOTE — Telephone Encounter (Signed)
Spoke with Vivien Rota in [place] this AM regarding pt BMX appt [phone] to see if pt could be scheduled sooner. Pt was offered appt on 2/6 at 0645, but refused. Pt called back stating, "my daughter told me not to wait." Spoke with Vivien Rota again and relayed to pt that this appt was already booked for another pt. Pt verbalized understanding.  [place] [phone] and spoke with [facility]. Rose called the pt who was confused about who was calling him and why. Called pt back and explained that Dr. Irene Limbo was referring him to get rehab d/t his symptoms. Pt verbalized understanding and willingness to go to appt on 2/6 at 0800. COnfirmed address with pt [address].  Readback performed regarding pt other appointments: 12/03/17 at 0800 for Cancer Rehab at [facility] 12/04/17 at 0830 with Dr. Lisbeth Renshaw in Radiation Oncology 12/05/17 at 0930 with Dr. [name] at [facility] Surgery (confirmed with office) 12/08/17 at 0900 with Radiology for bone marrow biopsy  Pt performed readback of all appt dates, times, and reasons for visit. Encouraged pt to call back with any concerns or confusion regarding appts. Pt wrote and performed readback of number [phone].

## 2017-12-01 NOTE — Progress Notes (Signed)
GI Location of Tumor / Histology:  11/18/17 Diagnosis Surgical [P], gastric mass - POORLY DIFFERENTIATED ADENOCARCINOMA. SEE NOTE. - WARTHIN-STARRY STAIN IS NON CONTRIBUTORY.  [name]. presented with symptoms of: He had a PET scan to restage his mantle cell lymphoma, and hypermetabolic activity noted on the scan.   Biopsies of revealed:  Gastric Mass revealed poorly differentiated adenocarcinoma.   Past/Anticipated interventions by surgeon, if any:  He has an appointment to see a surgeon tomorrow.   Past/Anticipated interventions by medical oncology, if any: Dr. Irene Limbo 11/27/17 Plan -Pt had Bx on 11/21/17 revealing a poorly differentiated adenocarcinoma along the greater curvarture of his stomach; appears fast growing.  -Discussed with pt and his family how the stage of the disease, patient's tolerance, concern of metastases all will inform the question of surgery. -Patient's continued neutropenia also will limit our treatment options, BM Bx would inform why his neutropenia is not bouncing back. -Discussed with pt an his family what their treatment goals are and inquired of them what these goals are. The pt and his family will continue to discuss their treatment goals together.  -Offered pt a referral to discuss surgical options with surgeon Dr. Stark Klein. -referral placed. -Offered to refer pt to a radiation oncologist - might need to consider RT vs chemo-RT if not a surgical candidate  He will see Dr. Irene Limbo again on 12/15/17   Weight changes, if any: He has lost 40 lbs since November 2017  Bowel/Bladder complaints, if any: He reports occasional constipation.   Nausea / Vomiting, if any:  He denies  Pain issues, if any:  He reports chronic pain to his upper Right Leg  Any blood per rectum:  He denies.   SAFETY ISSUES:  Prior radiation? No  Pacemaker/ICD? No  Possible current pregnancy? No  Is the patient on methotrexate? No  Current Complaints/Details: Dr.  Irene Limbo documents 11/27/17: #1 Stage IVBE Mantle cell lymphoma He had hypermetabolic lymphadenopathy and extensive bowel involvement. Has significant constitutional symptoms including debilitating fatigue , weight loss of about 40 pounds since November 2017, anorexia, some subjective chills. Patient's constitutional symptoms have resolved. PET/CT after 3 cycles showed good response to treatment. PET/CT after 6 cycles show excellent response to treatment , indeterminate new stomach lesion?peristaltic contraction  ---CT biopsy scheduled for 12/08/17   BP (!) 151/77   Pulse 66   Temp 97.9 F (36.6 C)   Ht 6' (1.829 m)   Wt 190 lb 9.6 oz (86.5 kg)   SpO2 100% Comment: room air  BMI 25.85 kg/m    Wt Readings from Last 3 Encounters:  12/04/17 190 lb 9.6 oz (86.5 kg)  11/27/17 191 lb 3.2 oz (86.7 kg)  11/18/17 185 lb (83.9 kg)

## 2017-12-02 ENCOUNTER — Encounter: Payer: Self-pay | Admitting: Genetics

## 2017-12-03 ENCOUNTER — Encounter: Payer: Self-pay | Admitting: Physical Therapy

## 2017-12-03 ENCOUNTER — Telehealth: Payer: Self-pay | Admitting: Hematology

## 2017-12-03 ENCOUNTER — Ambulatory Visit: Payer: Medicare Other | Attending: Hematology | Admitting: Physical Therapy

## 2017-12-03 ENCOUNTER — Other Ambulatory Visit: Payer: Self-pay

## 2017-12-03 DIAGNOSIS — R293 Abnormal posture: Secondary | ICD-10-CM | POA: Diagnosis not present

## 2017-12-03 DIAGNOSIS — R296 Repeated falls: Secondary | ICD-10-CM

## 2017-12-03 DIAGNOSIS — R600 Localized edema: Secondary | ICD-10-CM | POA: Diagnosis not present

## 2017-12-03 DIAGNOSIS — M6281 Muscle weakness (generalized): Secondary | ICD-10-CM | POA: Insufficient documentation
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
## 2017-12-03 NOTE — Telephone Encounter (Signed)
Called patient regarding injection

## 2017-12-04 ENCOUNTER — Ambulatory Visit
Admission: RE | Admit: 2017-12-04 | Discharge: 2017-12-04 | Disposition: A | Discharge status: Home / Self Care | Payer: Medicare Other | Source: Ambulatory Visit | Attending: Radiation Oncology | Admitting: Radiation Oncology

## 2017-12-04 ENCOUNTER — Encounter: Payer: Self-pay | Admitting: Radiation Oncology

## 2017-12-04 VITALS — BP 151/77 | HR 66 | Temp 97.9°F | Ht 72.0 in | Wt 190.6 lb

## 2017-12-04 DIAGNOSIS — Z955 Presence of coronary angioplasty implant and graft: Secondary | ICD-10-CM | POA: Insufficient documentation

## 2017-12-04 DIAGNOSIS — Z79899 Other long term (current) drug therapy: Secondary | ICD-10-CM | POA: Diagnosis not present

## 2017-12-04 DIAGNOSIS — C166 Malignant neoplasm of greater curvature of stomach, unspecified: Secondary | ICD-10-CM | POA: Diagnosis not present

## 2017-12-04 DIAGNOSIS — Z803 Family history of malignant neoplasm of breast: Secondary | ICD-10-CM | POA: Insufficient documentation

## 2017-12-04 DIAGNOSIS — Z85828 Personal history of other malignant neoplasm of skin: Secondary | ICD-10-CM | POA: Insufficient documentation

## 2017-12-04 DIAGNOSIS — M1990 Unspecified osteoarthritis, unspecified site: Secondary | ICD-10-CM | POA: Diagnosis not present

## 2017-12-04 DIAGNOSIS — D709 Neutropenia, unspecified: Secondary | ICD-10-CM | POA: Insufficient documentation

## 2017-12-04 DIAGNOSIS — C8318 Mantle cell lymphoma, lymph nodes of multiple sites: Secondary | ICD-10-CM

## 2017-12-04 DIAGNOSIS — Z888 Allergy status to other drugs, medicaments and biological substances status: Secondary | ICD-10-CM | POA: Insufficient documentation

## 2017-12-04 DIAGNOSIS — Z820 Family history of epilepsy and other diseases of the nervous system: Secondary | ICD-10-CM | POA: Diagnosis not present

## 2017-12-04 DIAGNOSIS — E785 Hyperlipidemia, unspecified: Secondary | ICD-10-CM | POA: Diagnosis not present

## 2017-12-04 DIAGNOSIS — K219 Gastro-esophageal reflux disease without esophagitis: Secondary | ICD-10-CM | POA: Diagnosis not present

## 2017-12-04 DIAGNOSIS — I10 Essential (primary) hypertension: Secondary | ICD-10-CM | POA: Insufficient documentation

## 2017-12-04 DIAGNOSIS — D702 Other drug-induced agranulocytosis: Secondary | ICD-10-CM

## 2017-12-04 DIAGNOSIS — C8313 Mantle cell lymphoma, intra-abdominal lymph nodes: Secondary | ICD-10-CM | POA: Diagnosis not present

## 2017-12-04 DIAGNOSIS — M797 Fibromyalgia: Secondary | ICD-10-CM | POA: Insufficient documentation

## 2017-12-04 DIAGNOSIS — Z9221 Personal history of antineoplastic chemotherapy: Secondary | ICD-10-CM | POA: Diagnosis not present

## 2017-12-04 DIAGNOSIS — L409 Psoriasis, unspecified: Secondary | ICD-10-CM | POA: Insufficient documentation

## 2017-12-04 DIAGNOSIS — C169 Malignant neoplasm of stomach, unspecified: Secondary | ICD-10-CM

## 2017-12-04 DIAGNOSIS — I2510 Atherosclerotic heart disease of native coronary artery without angina pectoris: Secondary | ICD-10-CM | POA: Diagnosis not present

## 2017-12-04 DIAGNOSIS — Z8249 Family history of ischemic heart disease and other diseases of the circulatory system: Secondary | ICD-10-CM | POA: Diagnosis not present

## 2017-12-04 DIAGNOSIS — M109 Gout, unspecified: Secondary | ICD-10-CM | POA: Insufficient documentation

## 2017-12-04 DIAGNOSIS — C162 Malignant neoplasm of body of stomach: Secondary | ICD-10-CM | POA: Insufficient documentation
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
## 2017-12-05 ENCOUNTER — Other Ambulatory Visit: Payer: Self-pay | Admitting: Radiology

## 2017-12-05 DIAGNOSIS — D709 Neutropenia, unspecified: Secondary | ICD-10-CM | POA: Diagnosis not present

## 2017-12-05 DIAGNOSIS — C162 Malignant neoplasm of body of stomach: Secondary | ICD-10-CM | POA: Diagnosis not present

## 2017-12-05 DIAGNOSIS — C8310 Mantle cell lymphoma, unspecified site: Secondary | ICD-10-CM | POA: Diagnosis not present

## 2017-12-05 DIAGNOSIS — I2510 Atherosclerotic heart disease of native coronary artery without angina pectoris: Secondary | ICD-10-CM | POA: Diagnosis not present

## 2017-12-08 ENCOUNTER — Other Ambulatory Visit: Payer: Self-pay | Admitting: Hematology

## 2017-12-08 ENCOUNTER — Encounter (HOSPITAL_COMMUNITY): Payer: Self-pay

## 2017-12-08 ENCOUNTER — Ambulatory Visit (HOSPITAL_COMMUNITY)
Admission: RE | Admit: 2017-12-08 | Discharge: 2017-12-08 | Disposition: A | Discharge status: Home / Self Care | Payer: Medicare Other | Source: Ambulatory Visit | Attending: Hematology | Admitting: Hematology

## 2017-12-08 ENCOUNTER — Inpatient Hospital Stay: Payer: Medicare Other | Attending: Hematology

## 2017-12-08 DIAGNOSIS — M109 Gout, unspecified: Secondary | ICD-10-CM | POA: Insufficient documentation

## 2017-12-08 DIAGNOSIS — Z955 Presence of coronary angioplasty implant and graft: Secondary | ICD-10-CM | POA: Insufficient documentation

## 2017-12-08 DIAGNOSIS — C8318 Mantle cell lymphoma, lymph nodes of multiple sites: Secondary | ICD-10-CM | POA: Diagnosis not present

## 2017-12-08 DIAGNOSIS — M797 Fibromyalgia: Secondary | ICD-10-CM | POA: Insufficient documentation

## 2017-12-08 DIAGNOSIS — D708 Other neutropenia: Secondary | ICD-10-CM | POA: Diagnosis not present

## 2017-12-08 DIAGNOSIS — I10 Essential (primary) hypertension: Secondary | ICD-10-CM | POA: Diagnosis not present

## 2017-12-08 DIAGNOSIS — C169 Malignant neoplasm of stomach, unspecified: Secondary | ICD-10-CM | POA: Insufficient documentation

## 2017-12-08 DIAGNOSIS — D649 Anemia, unspecified: Secondary | ICD-10-CM | POA: Diagnosis not present

## 2017-12-08 DIAGNOSIS — M19012 Primary osteoarthritis, left shoulder: Secondary | ICD-10-CM | POA: Diagnosis not present

## 2017-12-08 DIAGNOSIS — Z85028 Personal history of other malignant neoplasm of stomach: Secondary | ICD-10-CM | POA: Diagnosis not present

## 2017-12-08 DIAGNOSIS — Z85828 Personal history of other malignant neoplasm of skin: Secondary | ICD-10-CM | POA: Diagnosis not present

## 2017-12-08 DIAGNOSIS — D702 Other drug-induced agranulocytosis: Secondary | ICD-10-CM

## 2017-12-08 DIAGNOSIS — Z8249 Family history of ischemic heart disease and other diseases of the circulatory system: Secondary | ICD-10-CM | POA: Insufficient documentation

## 2017-12-08 DIAGNOSIS — Z5189 Encounter for other specified aftercare: Secondary | ICD-10-CM | POA: Insufficient documentation

## 2017-12-08 DIAGNOSIS — D630 Anemia in neoplastic disease: Secondary | ICD-10-CM | POA: Diagnosis not present

## 2017-12-08 DIAGNOSIS — Z79899 Other long term (current) drug therapy: Secondary | ICD-10-CM | POA: Insufficient documentation

## 2017-12-08 DIAGNOSIS — E785 Hyperlipidemia, unspecified: Secondary | ICD-10-CM | POA: Insufficient documentation

## 2017-12-08 DIAGNOSIS — D7589 Other specified diseases of blood and blood-forming organs: Secondary | ICD-10-CM | POA: Diagnosis not present

## 2017-12-08 DIAGNOSIS — E43 Unspecified severe protein-calorie malnutrition: Secondary | ICD-10-CM | POA: Diagnosis not present

## 2017-12-08 DIAGNOSIS — K219 Gastro-esophageal reflux disease without esophagitis: Secondary | ICD-10-CM | POA: Diagnosis not present

## 2017-12-08 DIAGNOSIS — D709 Neutropenia, unspecified: Secondary | ICD-10-CM | POA: Insufficient documentation

## 2017-12-08 DIAGNOSIS — Z9221 Personal history of antineoplastic chemotherapy: Secondary | ICD-10-CM | POA: Insufficient documentation

## 2017-12-08 DIAGNOSIS — Z8572 Personal history of non-Hodgkin lymphomas: Secondary | ICD-10-CM | POA: Diagnosis not present

## 2017-12-08 DIAGNOSIS — I2510 Atherosclerotic heart disease of native coronary artery without angina pectoris: Secondary | ICD-10-CM | POA: Diagnosis not present

## 2017-12-08 LAB — PROTIME-INR
INR: 1.07
Prothrombin Time: 13.8 seconds (ref 11.4–15.2)

## 2017-12-08 LAB — CBC WITH DIFFERENTIAL/PLATELET
Basophils Absolute: 0 10*3/uL (ref 0.0–0.1)
Basophils Relative: 1 %
EOS PCT: 6 %
Eosinophils Absolute: 0.2 10*3/uL (ref 0.0–0.7)
HEMATOCRIT: 25.2 % — AB (ref 39.0–52.0)
HEMOGLOBIN: 8.2 g/dL — AB (ref 13.0–17.0)
LYMPHS PCT: 57 %
Lymphs Abs: 1.9 10*3/uL (ref 0.7–4.0)
MCH: 31.3 pg (ref 26.0–34.0)
MCHC: 32.5 g/dL (ref 30.0–36.0)
MCV: 96.2 fL (ref 78.0–100.0)
MONOS PCT: 14 %
Monocytes Absolute: 0.5 10*3/uL (ref 0.1–1.0)
NEUTROS ABS: 0.7 10*3/uL — AB (ref 1.7–7.7)
Neutrophils Relative %: 22 %
Platelets: 179 10*3/uL (ref 150–400)
RBC: 2.62 MIL/uL — ABNORMAL LOW (ref 4.22–5.81)
RDW: 15 % (ref 11.5–15.5)
WBC: 3.3 10*3/uL — ABNORMAL LOW (ref 4.0–10.5)

## 2017-12-08 MED ORDER — HYDROCODONE-ACETAMINOPHEN 5-325 MG PO TABS: 1.0000 | ORAL_TABLET | ORAL | Status: DC | PRN

## 2017-12-08 MED ORDER — FENTANYL CITRATE (PF) 100 MCG/2ML IJ SOLN
INTRAMUSCULAR | Status: AC
Start: 2017-12-08 — End: 2017-12-08
  Filled 2017-12-08: qty 4

## 2017-12-08 MED ORDER — FENTANYL CITRATE (PF) 100 MCG/2ML IJ SOLN
INTRAMUSCULAR | Status: AC | PRN
  Administered 2017-12-08: 50 ug via INTRAVENOUS

## 2017-12-08 MED ORDER — PEGFILGRASTIM INJECTION 6 MG/0.6ML ~~LOC~~
6.0000 mg | PREFILLED_SYRINGE | Freq: Once | SUBCUTANEOUS | Status: AC
  Administered 2017-12-08: 6 mg via SUBCUTANEOUS

## 2017-12-08 MED ORDER — LIDOCAINE HCL (PF) 1 % IJ SOLN
INTRAMUSCULAR | Status: AC | PRN
  Administered 2017-12-08: 5 mL via INTRADERMAL
  Administered 2017-12-08: 10 mL via INTRADERMAL

## 2017-12-08 MED ORDER — PEGFILGRASTIM INJECTION 6 MG/0.6ML ~~LOC~~
PREFILLED_SYRINGE | SUBCUTANEOUS | Status: AC
  Filled 2017-12-08: qty 0.6

## 2017-12-08 MED ORDER — NALOXONE HCL 0.4 MG/ML IJ SOLN
INTRAMUSCULAR | Status: AC
  Filled 2017-12-08: qty 1

## 2017-12-08 MED ORDER — SODIUM CHLORIDE 0.9 % IV SOLN
INTRAVENOUS | Status: DC
  Administered 2017-12-08: 10:00:00 via INTRAVENOUS

## 2017-12-08 NOTE — Discharge Instructions (Signed)
Moderate Conscious Sedation, Adult, Care After These instructions provide you with information about caring for yourself after your procedure. Your health care provider may also give you more specific instructions. Your treatment has been planned according to current medical practices, but problems sometimes occur. Call your health care provider if you have any problems or questions after your procedure. What can I expect after the procedure? After your procedure, it is common:  To feel sleepy for several hours.  To feel clumsy and have poor balance for several hours.  To have poor judgment for several hours.  To vomit if you eat too soon.  Follow these instructions at home: For at least 24 hours after the procedure:   Do not: ? Participate in activities where you could fall or become injured. ? Drive. ? Use heavy machinery. ? Drink alcohol. ? Take sleeping pills or medicines that cause drowsiness. ? Make important decisions or sign legal documents. ? Take care of children on your own.  Rest. Eating and drinking  Follow the diet recommended by your health care provider.  If you vomit: ? Drink water, juice, or soup when you can drink without vomiting. ? Make sure you have little or no nausea before eating solid foods. General instructions  Have a responsible adult stay with you until you are awake and alert.  Take over-the-counter and prescription medicines only as told by your health care provider.  If you smoke, do not smoke without supervision.  Keep all follow-up visits as told by your health care provider. This is important. Contact a health care provider if:  You keep feeling nauseous or you keep vomiting.  You feel light-headed.  You develop a rash.  You have a fever. Get help right away if:  You have trouble breathing. This information is not intended to replace advice given to you by your health care provider. Make sure you discuss any questions you have  with your health care provider. Document Released: 08/04/2013 Document Revised: 03/18/2016 Document Reviewed: 02/03/2016 Elsevier Interactive Patient Education  2018 [place].   Bone Marrow Aspiration and Bone Marrow Biopsy, Adult, Care After This sheet gives you information about how to care for yourself after your procedure. Your health care provider may also give you more specific instructions. If you have problems or questions, contact your health care provider. What can I expect after the procedure? After the procedure, it is common to have:  Mild pain and tenderness.  Swelling.  Bruising.  Follow these instructions at home:  Take over-the-counter or prescription medicines only as told by your health care provider.  Do not take baths, swim, or use a hot tub until your health care provider approves. Ask if you can take a shower or have a sponge bath.  You may shower tomorrow.  Follow instructions from your health care provider about how to take care of the puncture site. Make sure you: ? Wash your hands with soap and water before you change your bandage (dressing). If soap and water are not available, use hand sanitizer. ? Change your dressing as told by your health care provider.  You may remove your dressing tomorrow.  Check your puncture siteevery day for signs of infection. Check for: ? More redness, swelling, or pain. ? More fluid or blood. ? Warmth. ? Pus or a bad smell.  Return to your normal activities as told by your health care provider. Ask your health care provider what activities are safe for you.  Do not drive  for 24 hours if you were given a medicine to help you relax (sedative). °· Keep all follow-up visits as told by your health care provider. This is important. °Contact a health care provider if: °· You have more redness, swelling, or pain around the puncture site. °· You have more fluid or blood coming from the puncture site. °· Your puncture site feels  warm to the touch. °· You have pus or a bad smell coming from the puncture site. °· You have a fever. °· Your pain is not controlled with medicine. °This information is not intended to replace advice given to you by your health care provider. Make sure you discuss any questions you have with your health care provider. °Document Released: 05/03/2005 Document Revised: 05/03/2016 Document Reviewed: 03/27/2016 °Elsevier Interactive Patient Education © 2018 Elsevier Inc. ° °

## 2017-12-08 NOTE — Procedures (Signed)
CT guided bone marrow biopsy. 2 aspirates and 2 cores. Minimal blood loss and no immediate complication.   
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
## 2017-12-10 ENCOUNTER — Encounter: Payer: Self-pay | Admitting: Physical Therapy

## 2017-12-10 ENCOUNTER — Telehealth: Payer: Self-pay | Admitting: Radiation Oncology

## 2017-12-10 ENCOUNTER — Ambulatory Visit: Payer: Medicare Other | Admitting: Physical Therapy

## 2017-12-10 DIAGNOSIS — M6281 Muscle weakness (generalized): Secondary | ICD-10-CM | POA: Diagnosis not present

## 2017-12-10 DIAGNOSIS — R600 Localized edema: Secondary | ICD-10-CM

## 2017-12-10 DIAGNOSIS — R296 Repeated falls: Secondary | ICD-10-CM

## 2017-12-10 DIAGNOSIS — R293 Abnormal posture: Secondary | ICD-10-CM | POA: Diagnosis not present
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
## 2017-12-10 NOTE — Telephone Encounter (Signed)
The patient is awaiting BM bx results. He is a surgical candidate per his discusion with Dr. [name]. We will follow up when available to review the next steps.

## 2017-12-11 ENCOUNTER — Telehealth: Payer: Self-pay | Admitting: *Deleted

## 2017-12-11 NOTE — Telephone Encounter (Signed)
Message from Dr. Harrington Challenger to add patient to clinic schedule for surgical clearance.  Pt has appointment 12/19/17 but called him to try to add on to 12/12/17.    Left message for him to call back.
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
## 2017-12-12 ENCOUNTER — Encounter: Payer: Medicare Other | Admitting: Physical Therapy

## 2017-12-14 ENCOUNTER — Other Ambulatory Visit: Payer: Self-pay | Admitting: Family Medicine

## 2017-12-15 ENCOUNTER — Encounter: Payer: Self-pay | Admitting: Hematology

## 2017-12-15 ENCOUNTER — Inpatient Hospital Stay: Payer: Medicare Other

## 2017-12-15 ENCOUNTER — Telehealth: Payer: Self-pay | Admitting: Hematology

## 2017-12-15 ENCOUNTER — Inpatient Hospital Stay (HOSPITAL_BASED_OUTPATIENT_CLINIC_OR_DEPARTMENT_OTHER): Payer: Medicare Other | Admitting: Hematology

## 2017-12-15 VITALS — BP 172/66 | HR 55 | Temp 98.4°F | Resp 18 | Ht 72.0 in | Wt 190.1 lb

## 2017-12-15 DIAGNOSIS — C169 Malignant neoplasm of stomach, unspecified: Secondary | ICD-10-CM

## 2017-12-15 DIAGNOSIS — Z5189 Encounter for other specified aftercare: Secondary | ICD-10-CM | POA: Diagnosis not present

## 2017-12-15 DIAGNOSIS — C779 Secondary and unspecified malignant neoplasm of lymph node, unspecified: Secondary | ICD-10-CM | POA: Diagnosis not present

## 2017-12-15 DIAGNOSIS — D649 Anemia, unspecified: Secondary | ICD-10-CM

## 2017-12-15 DIAGNOSIS — E43 Unspecified severe protein-calorie malnutrition: Secondary | ICD-10-CM

## 2017-12-15 DIAGNOSIS — R109 Unspecified abdominal pain: Secondary | ICD-10-CM

## 2017-12-15 DIAGNOSIS — R110 Nausea: Secondary | ICD-10-CM

## 2017-12-15 DIAGNOSIS — R140 Abdominal distension (gaseous): Secondary | ICD-10-CM

## 2017-12-15 DIAGNOSIS — K3189 Other diseases of stomach and duodenum: Secondary | ICD-10-CM

## 2017-12-15 DIAGNOSIS — D709 Neutropenia, unspecified: Secondary | ICD-10-CM

## 2017-12-15 DIAGNOSIS — C8318 Mantle cell lymphoma, lymph nodes of multiple sites: Secondary | ICD-10-CM

## 2017-12-15 DIAGNOSIS — R112 Nausea with vomiting, unspecified: Secondary | ICD-10-CM

## 2017-12-15 LAB — CBC WITH DIFFERENTIAL (CANCER CENTER ONLY)
BASOS ABS: 0 10*3/uL (ref 0.0–0.1)
Basophils Relative: 0 %
EOS PCT: 2 %
Eosinophils Absolute: 0.3 10*3/uL (ref 0.0–0.5)
HEMATOCRIT: 26.1 % — AB (ref 38.4–49.9)
Hemoglobin: 7.9 g/dL — ABNORMAL LOW (ref 13.0–17.1)
Lymphocytes Relative: 21 %
Lymphs Abs: 3 10*3/uL (ref 0.9–3.3)
MCH: 29.6 pg (ref 27.2–33.4)
MCHC: 30.3 g/dL — ABNORMAL LOW (ref 32.0–36.0)
MCV: 97.8 fL (ref 79.3–98.0)
MONO ABS: 0.9 10*3/uL (ref 0.1–0.9)
Monocytes Relative: 7 %
NEUTROS ABS: 10 10*3/uL — AB (ref 1.5–6.5)
Neutrophils Relative %: 70 %
PLATELETS: 138 10*3/uL — AB (ref 140–400)
RBC: 2.67 MIL/uL — ABNORMAL LOW (ref 4.20–5.82)
RDW: 17.2 % — AB (ref 11.0–14.6)
WBC Count: 14.3 10*3/uL — ABNORMAL HIGH (ref 4.0–10.3)

## 2017-12-15 LAB — CMP (CANCER CENTER ONLY)
ALBUMIN: 3.1 g/dL — AB (ref 3.5–5.0)
ALK PHOS: 105 U/L (ref 40–150)
ALT: 10 U/L (ref 0–55)
ANION GAP: 7 (ref 3–11)
AST: 41 U/L — ABNORMAL HIGH (ref 5–34)
BILIRUBIN TOTAL: 0.5 mg/dL (ref 0.2–1.2)
BUN: 9 mg/dL (ref 7–26)
CALCIUM: 8.4 mg/dL (ref 8.4–10.4)
CO2: 26 mmol/L (ref 22–29)
Chloride: 109 mmol/L (ref 98–109)
Creatinine: 0.9 mg/dL (ref 0.70–1.30)
GLUCOSE: 95 mg/dL (ref 70–140)
Potassium: 4 mmol/L (ref 3.5–5.1)
Sodium: 142 mmol/L (ref 136–145)
TOTAL PROTEIN: 5.1 g/dL — AB (ref 6.4–8.3)

## 2017-12-15 LAB — RETICULOCYTES
RBC.: 2.67 MIL/uL — AB (ref 4.20–5.82)
RETIC COUNT ABSOLUTE: 69.4 10*3/uL (ref 34.8–93.9)
Retic Ct Pct: 2.6 % — ABNORMAL HIGH (ref 0.8–1.8)

## 2017-12-15 LAB — CEA (IN HOUSE-CHCC): CEA (CHCC-In House): <1 ng/mL (ref 0.00–5.00)

## 2017-12-15 NOTE — Patient Instructions (Signed)
Thank you for choosing [facility] to provide your oncology and hematology care.  To afford each patient quality time with our providers, please arrive 30 minutes before your scheduled appointment time.  If you arrive late for your appointment, you may be asked to reschedule.  We strive to give you quality time with our providers, and arriving late affects you and other patients whose appointments are after yours.   If you are a no show for multiple scheduled visits, you may be dismissed from the clinic at the providers discretion.    Again, thank you for choosing [facility], our hope is that these requests will decrease the amount of time that you wait before being seen by our physicians.  ______________________________________________________________________  Should you have questions after your visit to the [facility], please contact our office at [phone] between the hours of 8:30 and 4:30 p.m.    Voicemails left after 4:30p.m will not be returned until the following business day.    For prescription refill requests, please have your pharmacy contact us directly.  Please also try to allow 48 hours for prescription requests.    Please contact the scheduling department for questions regarding scheduling.  For scheduling of procedures such as PET scans, CT scans, MRI, Ultrasound, etc please contact central scheduling at [phone].    Resources For Cancer Patients and Caregivers:   Oncolink.org:  A wonderful resource for patients and healthcare providers for information regarding your disease, ways to tract your treatment, what to expect, etc.     American Cancer Society:  800-227-2345  Can help patients locate various types of support and financial assistance  Cancer Care: 1-800-813-HOPE (4673) Provides financial assistance, online support groups, medication/co-pay assistance.    [place] DSS:  336-641-3447 Where to apply for food  stamps, Medicaid, and utility assistance  Medicare Rights Center: 800-333-4114 Helps people with Medicare understand their rights and benefits, navigate the Medicare system, and secure the quality healthcare they deserve  SCAT: 336-333-6589 [employer]'s shared-ride transportation service for eligible riders who have a disability that prevents them from riding the fixed route bus.    For additional information on assistance programs please contact our social worker:   Grier Hock/Abigail Elmore:  [phone]            

## 2017-12-15 NOTE — Telephone Encounter (Signed)
Appointment scheduled. AVS/ Calendar printed.  Patient was given contrast material for STAT CT ABD/PEL tomorrow morning per 2/18 los

## 2017-12-15 NOTE — Telephone Encounter (Signed)
Refill OK with 2 additional refills. 

## 2017-12-15 NOTE — Telephone Encounter (Signed)
Last refill 11/17/17 and last office visit 09/15/17.  Okay to fill?

## 2017-12-16 ENCOUNTER — Inpatient Hospital Stay: Payer: Medicare Other

## 2017-12-16 ENCOUNTER — Ambulatory Visit (HOSPITAL_COMMUNITY)
Admission: RE | Admit: 2017-12-16 | Discharge: 2017-12-16 | Disposition: A | Discharge status: Home / Self Care | Payer: Medicare Other | Source: Ambulatory Visit | Attending: Hematology | Admitting: Hematology

## 2017-12-16 ENCOUNTER — Encounter (HOSPITAL_COMMUNITY): Payer: Self-pay

## 2017-12-16 ENCOUNTER — Encounter: Payer: Medicare Other | Admitting: Physical Therapy

## 2017-12-16 DIAGNOSIS — C8318 Mantle cell lymphoma, lymph nodes of multiple sites: Secondary | ICD-10-CM

## 2017-12-16 DIAGNOSIS — D649 Anemia, unspecified: Secondary | ICD-10-CM

## 2017-12-16 DIAGNOSIS — R140 Abdominal distension (gaseous): Secondary | ICD-10-CM | POA: Diagnosis not present

## 2017-12-16 DIAGNOSIS — C169 Malignant neoplasm of stomach, unspecified: Secondary | ICD-10-CM | POA: Insufficient documentation

## 2017-12-16 DIAGNOSIS — E43 Unspecified severe protein-calorie malnutrition: Secondary | ICD-10-CM | POA: Diagnosis not present

## 2017-12-16 DIAGNOSIS — J90 Pleural effusion, not elsewhere classified: Secondary | ICD-10-CM | POA: Diagnosis not present

## 2017-12-16 DIAGNOSIS — R109 Unspecified abdominal pain: Secondary | ICD-10-CM

## 2017-12-16 DIAGNOSIS — D709 Neutropenia, unspecified: Secondary | ICD-10-CM

## 2017-12-16 DIAGNOSIS — R112 Nausea with vomiting, unspecified: Secondary | ICD-10-CM | POA: Diagnosis not present

## 2017-12-16 DIAGNOSIS — Z5189 Encounter for other specified aftercare: Secondary | ICD-10-CM | POA: Diagnosis not present

## 2017-12-16 DIAGNOSIS — R110 Nausea: Secondary | ICD-10-CM

## 2017-12-16 LAB — RETICULOCYTES
RBC.: 2.86 MIL/uL — AB (ref 4.20–5.82)
RETIC COUNT ABSOLUTE: 85.8 10*3/uL (ref 34.8–93.9)
Retic Ct Pct: 3 % — ABNORMAL HIGH (ref 0.8–1.8)

## 2017-12-16 LAB — CBC WITH DIFFERENTIAL (CANCER CENTER ONLY)
BASOS PCT: 0 %
Basophils Absolute: 0 10*3/uL (ref 0.0–0.1)
Eosinophils Absolute: 0.2 10*3/uL (ref 0.0–0.5)
Eosinophils Relative: 1 %
HEMATOCRIT: 28 % — AB (ref 38.4–49.9)
HEMOGLOBIN: 8.5 g/dL — AB (ref 13.0–17.1)
LYMPHS PCT: 23 %
Lymphs Abs: 2.6 10*3/uL (ref 0.9–3.3)
MCH: 29.7 pg (ref 27.2–33.4)
MCHC: 30.4 g/dL — ABNORMAL LOW (ref 32.0–36.0)
MCV: 97.9 fL (ref 79.3–98.0)
Monocytes Absolute: 0.8 10*3/uL (ref 0.1–0.9)
Monocytes Relative: 7 %
NEUTROS ABS: 7.8 10*3/uL — AB (ref 1.5–6.5)
NEUTROS PCT: 69 %
Platelet Count: 137 10*3/uL — ABNORMAL LOW (ref 140–400)
RBC: 2.86 MIL/uL — AB (ref 4.20–5.82)
RDW: 17.2 % — ABNORMAL HIGH (ref 11.0–14.6)
Smear Review: 1
WBC: 11.4 10*3/uL — AB (ref 4.0–10.3)

## 2017-12-16 LAB — COMPREHENSIVE METABOLIC PANEL
ALBUMIN: 3.3 g/dL — AB (ref 3.5–5.0)
ALT: 9 U/L (ref 0–55)
ANION GAP: 7 (ref 3–11)
AST: 36 U/L — ABNORMAL HIGH (ref 5–34)
Alkaline Phosphatase: 105 U/L (ref 40–150)
BILIRUBIN TOTAL: 0.5 mg/dL (ref 0.2–1.2)
BUN: 12 mg/dL (ref 7–26)
CHLORIDE: 110 mmol/L — AB (ref 98–109)
CO2: 25 mmol/L (ref 22–29)
Calcium: 8.7 mg/dL (ref 8.4–10.4)
Creatinine, Ser: 0.92 mg/dL (ref 0.70–1.30)
GFR calc Af Amer: >60 mL/min (ref >60)
GLUCOSE: 99 mg/dL (ref 70–140)
Potassium: 4 mmol/L (ref 3.5–5.1)
Sodium: 142 mmol/L (ref 136–145)
TOTAL PROTEIN: 5.3 g/dL — AB (ref 6.4–8.3)

## 2017-12-16 LAB — ABO/RH: ABO/RH(D): A NEG

## 2017-12-16 MED ORDER — IOPAMIDOL (ISOVUE-300) INJECTION 61%
100.0000 mL | Freq: Once | INTRAVENOUS | Status: AC | PRN
  Administered 2017-12-16: 100 mL via INTRAVENOUS

## 2017-12-16 MED ORDER — IOPAMIDOL (ISOVUE-300) INJECTION 61%
INTRAVENOUS | Status: AC
  Filled 2017-12-16: qty 100

## 2017-12-18 ENCOUNTER — Ambulatory Visit (HOSPITAL_COMMUNITY)
Admission: RE | Admit: 2017-12-18 | Discharge: 2017-12-18 | Disposition: A | Discharge status: Home / Self Care | Payer: Medicare Other | Source: Ambulatory Visit | Attending: Hematology | Admitting: Hematology

## 2017-12-18 ENCOUNTER — Encounter: Payer: Medicare Other | Admitting: Physical Therapy

## 2017-12-18 DIAGNOSIS — D649 Anemia, unspecified: Secondary | ICD-10-CM | POA: Insufficient documentation

## 2017-12-18 LAB — PREPARE RBC (CROSSMATCH)

## 2017-12-18 MED ORDER — SODIUM CHLORIDE 0.9% FLUSH: 3.0000 mL | INTRAVENOUS | Status: DC | PRN

## 2017-12-18 MED ORDER — HEPARIN SOD (PORK) LOCK FLUSH 100 UNIT/ML IV SOLN: 250.0000 [IU] | INTRAVENOUS | Status: DC | PRN

## 2017-12-18 MED ORDER — HEPARIN SOD (PORK) LOCK FLUSH 100 UNIT/ML IV SOLN: 500.0000 [IU] | Freq: Every day | INTRAVENOUS | Status: DC | PRN

## 2017-12-18 MED ORDER — DIPHENHYDRAMINE HCL 25 MG PO CAPS
25.0000 mg | ORAL_CAPSULE | Freq: Once | ORAL | Status: AC
  Administered 2017-12-18: 25 mg via ORAL
  Filled 2017-12-18: qty 1

## 2017-12-18 MED ORDER — SODIUM CHLORIDE 0.9 % IV SOLN
250.0000 mL | Freq: Once | INTRAVENOUS | Status: AC
  Administered 2017-12-18: 250 mL via INTRAVENOUS

## 2017-12-18 MED ORDER — METHYLPREDNISOLONE SODIUM SUCC 125 MG IJ SOLR
40.0000 mg | Freq: Once | INTRAMUSCULAR | Status: AC
  Administered 2017-12-18: 40 mg via INTRAVENOUS
  Filled 2017-12-18: qty 2

## 2017-12-18 MED ORDER — ACETAMINOPHEN 325 MG PO TABS
650.0000 mg | ORAL_TABLET | Freq: Once | ORAL | Status: AC
  Administered 2017-12-18: 650 mg via ORAL
  Filled 2017-12-18: qty 2

## 2017-12-18 MED ORDER — SODIUM CHLORIDE 0.9% FLUSH: 10.0000 mL | INTRAVENOUS | Status: DC | PRN

## 2017-12-18 NOTE — Discharge Instructions (Signed)

## 2017-12-19 ENCOUNTER — Encounter: Payer: Self-pay | Admitting: Internal Medicine

## 2017-12-19 ENCOUNTER — Ambulatory Visit (INDEPENDENT_AMBULATORY_CARE_PROVIDER_SITE_OTHER): Payer: Medicare Other | Admitting: Internal Medicine

## 2017-12-19 VITALS — BP 158/70 | HR 72 | Ht 72.0 in | Wt 187.4 lb

## 2017-12-19 DIAGNOSIS — I10 Essential (primary) hypertension: Secondary | ICD-10-CM | POA: Diagnosis not present

## 2017-12-19 DIAGNOSIS — I5043 Acute on chronic combined systolic (congestive) and diastolic (congestive) heart failure: Secondary | ICD-10-CM

## 2017-12-19 DIAGNOSIS — Z01810 Encounter for preprocedural cardiovascular examination: Secondary | ICD-10-CM | POA: Diagnosis not present

## 2017-12-19 DIAGNOSIS — E782 Mixed hyperlipidemia: Secondary | ICD-10-CM

## 2017-12-19 DIAGNOSIS — I2510 Atherosclerotic heart disease of native coronary artery without angina pectoris: Secondary | ICD-10-CM

## 2017-12-19 DIAGNOSIS — C162 Malignant neoplasm of body of stomach: Secondary | ICD-10-CM | POA: Diagnosis not present

## 2017-12-19 LAB — TYPE AND SCREEN
ABO/RH(D): A NEG
Antibody Screen: NEGATIVE
UNIT DIVISION: 0
Unit division: 0

## 2017-12-19 LAB — BPAM RBC
BLOOD PRODUCT EXPIRATION DATE: 201903052359
Blood Product Expiration Date: 201903052359
ISSUE DATE / TIME: 201902210859
ISSUE DATE / TIME: 201902210859
UNIT TYPE AND RH: 600
Unit Type and Rh: 600

## 2017-12-19 MED ORDER — FUROSEMIDE 40 MG PO TABS: ORAL_TABLET | ORAL | 3 refills | Status: DC

## 2017-12-19 MED ORDER — POTASSIUM CHLORIDE CRYS ER 20 MEQ PO TBCR: EXTENDED_RELEASE_TABLET | ORAL | 3 refills | Status: DC
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
## 2017-12-19 NOTE — Patient Instructions (Signed)
Medication Instructions:  Your physician has recommended you make the following change in your medication:  1.) furosemide (Lasix) 40 mg  Take one tablet 1-2 times per week 2.) potassium 20 meq  Take one tablet 1-2 times per week (take with Lasix)   Labwork: NONE  Testing/Procedures: Your physician has requested that you have an echocardiogram. Echocardiography is a painless test that uses sound waves to create images of your heart. It provides your doctor with information about the size and shape of your heart and how well your heart's chambers and valves are working. This procedure takes approximately one hour. There are no restrictions for this procedure.    Follow-Up:   Any Other Special Instructions Will Be Listed Below (If Applicable).     If you need a refill on your cardiac medications before your next appointment, please call your pharmacy.

## 2017-12-22 ENCOUNTER — Other Ambulatory Visit: Payer: Self-pay | Admitting: General Surgery

## 2017-12-22 ENCOUNTER — Encounter (HOSPITAL_COMMUNITY): Payer: Self-pay

## 2017-12-22 NOTE — Addendum Note (Signed)
Addended by: Patterson Hammersmith A on: 12/22/2017 03:57 PM   Modules accepted: Orders

## 2017-12-23 ENCOUNTER — Encounter (HOSPITAL_COMMUNITY): Payer: Self-pay | Admitting: *Deleted

## 2017-12-23 LAB — CHROMOSOME ANALYSIS, BONE MARROW

## 2017-12-24 ENCOUNTER — Encounter: Payer: Medicare Other | Admitting: Physical Therapy

## 2017-12-24 ENCOUNTER — Encounter (HOSPITAL_COMMUNITY): Payer: Self-pay

## 2017-12-24 NOTE — Patient Instructions (Signed)
[name].  12/24/2017   Your procedure is scheduled on: 12-30-17  Report to [facility] Main  Entrance   ARRIVE AT 103 AM. Have a seat in the Main Lobby. Please note there is a phone at the [employer].  Please call [phone] on that phone. Someone from Short Stay will come and get you from the Main Lobby and take you to Short Stay.   Call this number if you have problems the morning of surgery [phone]   Remember: Do not eat food  :After Midnight.  NO SOLID FOOD AFTER MIDNIGHT THE NIGHT PRIOR TO SURGERY. NOTHING BY MOUTH EXCEPT CLEAR LIQUIDS UNTIL 3 HOURS PRIOR TO [place] SURGERY. PLEASE FINISH ENSURE DRINK PER SURGEON ORDER 3 HOURS PRIOR TO SCHEDULED SURGERY TIME WHICH NEEDS TO BE COMPLETED AT ____0430am________.   Take these medicines the morning of surgery with A SIP OF WATER: protonix , carvedilol                                You may not have any metal on your body including hair pins and              piercings  Do not wear jewelry, , lotions, powders or perfumes, deodorant                         Men may shave face and neck.   Do not bring valuables to the hospital. [place].  Contacts, dentures or bridgework may not be worn into surgery.  Leave suitcase in the car. After surgery it may be brought to your room.    Special Instructions: N/A              Please read over the following fact sheets you were given: _____________________________________________________________________           [facility] - Preparing for Surgery Before surgery, you can play an important role.  Because skin is not sterile, your skin needs to be as free of germs as possible.  You can reduce the number of germs on your skin by washing with CHG (chlorahexidine gluconate) soap before surgery.  CHG is an antiseptic cleaner which kills germs and bonds with the skin to continue killing germs even  after washing. Please DO NOT use if you have an allergy to CHG or antibacterial soaps.  If your skin becomes reddened/irritated stop using the CHG and inform your nurse when you arrive at Short Stay. Do not shave (including legs and underarms) for at least 48 hours prior to the first CHG shower.  You may shave your face/neck. Please follow these instructions carefully:  1.  Shower with CHG Soap the night before surgery and the  morning of Surgery.  2.  If you choose to wash your hair, wash your hair first as usual with your  normal  shampoo.  3.  After you shampoo, rinse your hair and body thoroughly to remove the  shampoo.                           4.  Use CHG as you would any other liquid soap.  You can  apply chg directly  to the skin and wash                       Gently with a scrungie or clean washcloth.  5.  Apply the CHG Soap to your body ONLY FROM THE NECK DOWN.   Do not use on face/ open                           Wound or open sores. Avoid contact with eyes, ears mouth and genitals (private parts).                       Wash face,  Genitals (private parts) with your normal soap.             6.  Wash thoroughly, paying special attention to the area where your surgery  will be performed.  7.  Thoroughly rinse your body with warm water from the neck down.  8.  DO NOT shower/wash with your normal soap after using and rinsing off  the CHG Soap.                9.  Pat yourself dry with a clean towel.            10.  Wear clean pajamas.            11.  Place clean sheets on your bed the night of your first shower and do not  sleep with pets. Day of Surgery : Do not apply any lotions/deodorants the morning of surgery.  Please wear clean clothes to the hospital/surgery center.  FAILURE TO FOLLOW THESE INSTRUCTIONS MAY RESULT IN THE CANCELLATION OF YOUR SURGERY PATIENT SIGNATURE_________________________________  NURSE  SIGNATURE__________________________________  ________________________________________________________________________  WHAT IS A BLOOD TRANSFUSION? Blood Transfusion Information  A transfusion is the replacement of blood or some of its parts. Blood is made up of multiple cells which provide different functions.  Red blood cells carry oxygen and are used for blood loss replacement.  White blood cells fight against infection.  Platelets control bleeding.  Plasma helps clot blood.  Other blood products are available for specialized needs, such as hemophilia or other clotting disorders. BEFORE THE TRANSFUSION  Who gives blood for transfusions?   Healthy volunteers who are fully evaluated to make sure their blood is safe. This is blood bank blood. Transfusion therapy is the safest it has ever been in the practice of medicine. Before blood is taken from a donor, a complete history is taken to make sure that person has no history of diseases nor engages in risky social behavior (examples are intravenous drug use or sexual activity with multiple partners). The donor's travel history is screened to minimize risk of transmitting infections, such as malaria. The donated blood is tested for signs of infectious diseases, such as HIV and hepatitis. The blood is then tested to be sure it is compatible with you in order to minimize the chance of a transfusion reaction. If you or a relative donates blood, this is often done in anticipation of surgery and is not appropriate for emergency situations. It takes many days to process the donated blood. RISKS AND COMPLICATIONS Although transfusion therapy is very safe and saves many lives, the main dangers of transfusion include:   Getting an infectious disease.  Developing a transfusion reaction. This is an allergic reaction to something in the blood you were given. Every precaution  is taken to prevent this. The decision to have a blood transfusion has been  considered carefully by your caregiver before blood is given. Blood is not given unless the benefits outweigh the risks. AFTER THE TRANSFUSION  Right after receiving a blood transfusion, you will usually feel much better and more energetic. This is especially true if your red blood cells have gotten low (anemic). The transfusion raises the level of the red blood cells which carry oxygen, and this usually causes an energy increase.  The nurse administering the transfusion will monitor you carefully for complications. HOME CARE INSTRUCTIONS  No special instructions are needed after a transfusion. You may find your energy is better. Speak with your caregiver about any limitations on activity for underlying diseases you may have. SEEK MEDICAL CARE IF:   Your condition is not improving after your transfusion.  You develop redness or irritation at the intravenous (IV) site. SEEK IMMEDIATE MEDICAL CARE IF:  Any of the following symptoms occur over the next 12 hours:  Shaking chills.  You have a temperature by mouth above 102 F (38.9 C), not controlled by medicine.  Chest, back, or muscle pain.  People around you feel you are not acting correctly or are confused.  Shortness of breath or difficulty breathing.  Dizziness and fainting.  You get a rash or develop hives.  You have a decrease in urine output.  Your urine turns a dark color or changes to pink, red, or brown. Any of the following symptoms occur over the next 10 days:  You have a temperature by mouth above 102 F (38.9 C), not controlled by medicine.  Shortness of breath.  Weakness after normal activity.  The white part of the eye turns yellow (jaundice).  You have a decrease in the amount of urine or are urinating less often.  Your urine turns a dark color or changes to pink, red, or brown. Document Released: 10/11/2000 Document Revised: 01/06/2012 Document Reviewed: 05/30/2008 ExitCare Patient Information 2014  [address].  _______________________________________________________________________  Incentive Spirometer  An incentive spirometer is a tool that can help keep your lungs clear and active. This tool measures how well you are filling your lungs with each breath. Taking long deep breaths may help reverse or decrease the chance of developing breathing (pulmonary) problems (especially infection) following:  A long period of time when you are unable to move or be active. BEFORE THE PROCEDURE   If the spirometer includes an indicator to show your best effort, your nurse or respiratory therapist will set it to a desired goal.  If possible, sit up straight or lean slightly forward. Try not to slouch.  Hold the incentive spirometer in an upright position. INSTRUCTIONS FOR USE  1. Sit on the edge of your bed if possible, or sit up as far as you can in bed or on a chair. 2. Hold the incentive spirometer in an upright position. 3. Breathe out normally. 4. Place the mouthpiece in your mouth and seal your lips tightly around it. 5. Breathe in slowly and as deeply as possible, raising the piston or the ball toward the top of the column. 6. Hold your breath for 3-5 seconds or for as long as possible. Allow the piston or ball to fall to the bottom of the column. 7. Remove the mouthpiece from your mouth and breathe out normally. 8. Rest for a few seconds and repeat Steps 1 through 7 at least 10 times every 1-2 hours when you are awake. Take  your time and take a few normal breaths between deep breaths. 9. The spirometer may include an indicator to show your best effort. Use the indicator as a goal to work toward during each repetition. 10. After each set of 10 deep breaths, practice coughing to be sure your lungs are clear. If you have an incision (the cut made at the time of surgery), support your incision when coughing by placing a pillow or rolled up towels firmly against it. Once you are able to get  out of bed, walk around indoors and cough well. You may stop using the incentive spirometer when instructed by your caregiver.  RISKS AND COMPLICATIONS  Take your time so you do not get dizzy or light-headed.  If you are in pain, you may need to take or ask for pain medication before doing incentive spirometry. It is harder to take a deep breath if you are having pain. AFTER USE  Rest and breathe slowly and easily.  It can be helpful to keep track of a log of your progress. Your caregiver can provide you with a simple table to help with this. If you are using the spirometer at home, follow these instructions: [place] IF:   You are having difficultly using the spirometer.  You have trouble using the spirometer as often as instructed.  Your pain medication is not giving enough relief while using the spirometer.  You develop fever of 100.5 F (38.1 C) or higher. SEEK IMMEDIATE MEDICAL CARE IF:   You cough up bloody sputum that had not been present before.  You develop fever of 102 F (38.9 C) or greater.  You develop worsening pain at or near the incision site. MAKE SURE YOU:   Understand these instructions.  Will watch your condition.  Will get help right away if you are not doing well or get worse. Document Released: 02/24/2007 Document Revised: 01/06/2012 Document Reviewed: 04/27/2007 [facility] Patient Information 2014 [place], [place].   ________________________________________________________________________

## 2017-12-25 ENCOUNTER — Other Ambulatory Visit: Payer: Self-pay

## 2017-12-25 ENCOUNTER — Ambulatory Visit (HOSPITAL_COMMUNITY): Payer: Medicare Other | Attending: Cardiovascular Disease

## 2017-12-25 DIAGNOSIS — I10 Essential (primary) hypertension: Secondary | ICD-10-CM | POA: Diagnosis not present

## 2017-12-25 DIAGNOSIS — I2510 Atherosclerotic heart disease of native coronary artery without angina pectoris: Secondary | ICD-10-CM | POA: Diagnosis not present

## 2017-12-25 DIAGNOSIS — I351 Nonrheumatic aortic (valve) insufficiency: Secondary | ICD-10-CM | POA: Insufficient documentation

## 2017-12-25 DIAGNOSIS — E785 Hyperlipidemia, unspecified: Secondary | ICD-10-CM | POA: Diagnosis not present

## 2017-12-25 DIAGNOSIS — Z01810 Encounter for preprocedural cardiovascular examination: Secondary | ICD-10-CM | POA: Diagnosis not present

## 2017-12-25 LAB — ECHOCARDIOGRAM COMPLETE
CHL CUP MV DEC (S): 384
EERAT: 13.83
EWDT: 384 ms
FS: 27 % — AB (ref 28–44)
IV/PV OW: 1
LA diam end sys: 42 mm
LA diam index: 2.01 cm/m2
LA vol A4C: 62 ml
LA vol index: 36.9 mL/m2
LASIZE: 42 mm
LAVOL: 77 mL
LDCA: 3.46 cm2
LV E/e' medial: 13.83
LV PW d: 13.8 mm — AB (ref 0.6–1.1)
LV TDI E'LATERAL: 4.28
LV e' LATERAL: 4.28 cm/s
LVEEAVG: 13.83
LVOT diameter: 21 mm
MV pk A vel: 96.3 m/s
MVPKEVEL: 59.2 m/s
P 1/2 time: 468 ms
RV LATERAL S' VELOCITY: 9.1 cm/s
TDI e' medial: 4.93

## 2017-12-25 NOTE — Progress Notes (Signed)
lov and clearance Dr. Harrington Challenger 12-19-17 epic  Cbc 12-16-17 epic  cmp 12-16-17 epic   cxr 09-10-17 epic  Echo 12-25-17 epic

## 2017-12-26 ENCOUNTER — Encounter: Payer: Medicare Other | Admitting: Physical Therapy

## 2017-12-26 ENCOUNTER — Encounter (HOSPITAL_COMMUNITY): Payer: Self-pay

## 2017-12-26 ENCOUNTER — Encounter (HOSPITAL_COMMUNITY)
Admission: RE | Admit: 2017-12-26 | Discharge: 2017-12-26 | Disposition: A | Discharge status: Home / Self Care | Payer: Medicare Other | Source: Ambulatory Visit | Attending: General Surgery | Admitting: General Surgery

## 2017-12-26 ENCOUNTER — Other Ambulatory Visit: Payer: Self-pay

## 2017-12-26 DIAGNOSIS — Z01812 Encounter for preprocedural laboratory examination: Secondary | ICD-10-CM | POA: Diagnosis not present

## 2017-12-26 DIAGNOSIS — C169 Malignant neoplasm of stomach, unspecified: Secondary | ICD-10-CM | POA: Insufficient documentation

## 2017-12-26 HISTORY — DX: Non-Hodgkin lymphoma, unspecified, unspecified site: C85.90

## 2017-12-26 HISTORY — DX: Heart failure, unspecified: I50.9

## 2017-12-26 HISTORY — DX: Anemia, unspecified: D64.9

## 2017-12-26 LAB — CBC
HEMATOCRIT: 30.5 % — AB (ref 39.0–52.0)
Hemoglobin: 9.5 g/dL — ABNORMAL LOW (ref 13.0–17.0)
MCH: 30.4 pg (ref 26.0–34.0)
MCHC: 31.1 g/dL (ref 30.0–36.0)
MCV: 97.4 fL (ref 78.0–100.0)
PLATELETS: 224 10*3/uL (ref 150–400)
RBC: 3.13 MIL/uL — AB (ref 4.22–5.81)
RDW: 17.4 % — AB (ref 11.5–15.5)
WBC: 4.5 10*3/uL (ref 4.0–10.5)

## 2017-12-26 NOTE — Progress Notes (Signed)
Anesthesia called for consult per MD order. Will see am of surgery.

## 2017-12-26 NOTE — Progress Notes (Signed)
CBC done 12-26-17 routed to Dr. [name] via eoic.

## 2017-12-29 NOTE — H&P (Signed)
[name] Documented: 12/19/2017 10:20 AM Location: [facility] Patient #: [id number] DOB: Jun 04, 1933 Married / Language: [name] / Race: White Male   History of Present Illness Jacob Klein MD; 12/19/2017 1:37 PM) The patient is a 84 year old male who presents for a follow-up for Gastric cancer. Patient is an 84 year old male who presented with an abnormal PET scan on follow-up for lymphoma (Stage IV, dx 08/2016). He had a gastric mass seen on the PET scan and upper endoscopy confirmed a gastric cancer in this location. Was 4-5 cm in diameter in the body of the stomach. The patient denies any issues in appetite. He has not had any weight loss. His energy level has improved significantly since his treatment for lymphoma. He has not had any blood in his stool of which he is aware. His lymphoma was diagnosed during workup for diarrhea and weight loss. Since he had chemotherapy for this, this has resolved. His main problem at this point is neutropenia that has not recovered as expected after treatment.  Pt is here for follow up after bone marrow biopsy and restaging CT of abdomen. He has also seen radiation oncology. His bone marrow bx was negative for marrow involvement with lymphoma. His CT showed some increase in size of the gastric mass. He continues to be asymptomatic. He required a blood transfusion, but this was felt to be due to his lymphoma treatment. He is leaning toward surgery, but is reluctant because of the feeding tube.   CT abd/pelvis 12/16/2017 IMPRESSION: Increased size of 5.7 cm gastric mass, consistent with recently diagnosed gastric carcinoma. No evidence of metastatic disease. No evidence of gastric outlet obstruction.  New small bilateral pleural effusions incidentally noted.  PET 09/08/17 IMPRESSION: 1. Generally there is continued improvement, with the prior right hilar activity reduced from SUV 3.7 to current SUV 3.0 (now [place]  3). 2. There is an unusual appearance along the cephalad margin of the stomach body, with a somewhat masslike appearance with accentuated metabolic activity (maximum SUV 6.3) which is completely new compared to 06/03/17. Because this is new and occurs in the setting of otherwise improving disease, the possibility that this may represent some sort of false-positive such as a peristaltic contraction is raised. Consider endoscopy or upper GI examination for further characterization. 3. Other imaging findings of potential clinical significance: Aortic Atherosclerosis (ICD10-I70.0). Coronary atherosclerosis with mild cardiomegaly. Airway thickening is present, suggesting bronchitis or reactive airways disease. Mild Prostatomegaly. Prior small amount of free pelvic fluid has resolved. Prior diffusely accentuated metabolic activity in the marrow has resolved.  UGI 10/27/17 IMPRESSION: Irregular lobulated 5 cm mass in the anterior aspect of the distal antrum of the stomach. This correlates with the area of abnormal activity on PET-CT scan. This could represent gastric involvement with the patient's known mantle cell lymphoma or primary gastric cancer.  EGD 11/18/17 - Normal esophagus. - Normal examined duodenum. - 4cm, clearly malignant mass along the greater curvature of the stomach, mid-body. This was biopsied extensively  pathology 11/18/2017 Diagnosis Surgical [P], gastric mass - POORLY DIFFERENTIATED ADENOCARCINOMA. SEE NOTE. - WARTHIN-STARRY STAIN IS NON CONTRIBUTORY. her -2 negative.    Allergies [name], [place]; 12/19/2017 10:21 AM) No Known Drug Allergies [12/05/2017]: Allergies Reconciled   Medication History [name], [place]; 12/19/2017 10:21 AM) TraMADol HCl ('50MG'$  Tablet, Oral) Active. Carvedilol (6.'25MG'$  Tablet, Oral) Active. Pantoprazole Sodium ('40MG'$  Tablet DR, Oral) Active. Rosuvastatin Calcium ('10MG'$  Tablet, Oral) Active. Medications  Reconciled    Review of Systems [name]  Jacob Dienes MD; 12/19/2017 1:38 PM) All other systems negative  Vitals [name] RMA; 12/19/2017 10:21 AM) 12/19/2017 10:20 AM Weight: 188.4 lb Height: 72in Body Surface Area: 2.08 m Body Mass Index: 25.55 kg/m  Temp.: 98.67F  Pulse: 81 (Regular)  BP: 150/85 (Sitting, Left Arm, Standard)       Physical Exam Jacob Klein MD; 12/19/2017 1:38 PM) The physical exam findings are as follows: Note:this visit was spent 100% in counseling.    Assessment & Plan Jacob Klein MD; 12/19/2017 1:43 PM) MALIGNANT NEOPLASM OF BODY OF STOMACH (C16.2) Impression: I discussed with him, his wife, and his daughter what surgery would entail. I discussed the potential for doing a diagnostic laparoscopy as a separate procedure. I discussed again that I would not know how much of the stomach I would need to resect until the time of surgery. I also discussed that I think his overall survival will be better with surgery. He does not appear to be a candidate for chemotherapy because of his continued neutropenia. Radiation would be palliative at best and this would not be administered until he were to have significant bleeding or pain. As he is currently asymptomatic, he would currently not be recommended to start treatment.  I recommended that he call on Monday with a decision. I do think we will do better if we are going to do surgery to not put off the decision any longer. He already has shown some increase in size of the tumor compared to November when he had his PET scan. His risk of stage IV gastric cancer increases with time.  The bulk of our discussion today involves changes in diet and how this occurs over time. I advised that I would anticipate a feeding tube being temporary as his appetite is increasing. He is going to consider these factors over the weekend.  35 min spent in evaluation, examination, counseling, and coordination of care. >50% spent in  counseling. Current Plans Follow Up - Call [facility] office after tests / studies doneto discuss further plans CORONARY ARTERY DISEASE (I25.10) Impression: He has appt this afternoon to evaluate his need for additional testing for risk stratification.    Signed by Jacob Klein, MD (12/19/2017 1:43 PM)

## 2017-12-29 NOTE — Anesthesia Preprocedure Evaluation (Addendum)
Anesthesia Evaluation  Patient identified by MRN, date of birth, ID band Patient awake    Reviewed: Allergy & Precautions, NPO status , Patient's Chart, lab work & pertinent test results  History of Anesthesia Complications Negative for: history of anesthetic complications  Airway Mallampati: II  TM Distance: >3 FB     Dental  (+) Edentulous Upper, Edentulous Lower, Dental Advisory Given   Pulmonary neg pulmonary ROS,    Pulmonary exam normal        Cardiovascular hypertension, Pt. on home beta blockers + CAD and + Cardiac Stents  Normal cardiovascular exam  Study Conclusions  - Left ventricle: Diffuse hypokinesis worse in the septum , apex   and mid-basal inferior wall. Wall thickness was increased in a   pattern of mild LVH. Systolic function was moderately reduced.   The estimated ejection fraction was in the range of 35% to 40%.   Doppler parameters are consistent with abnormal left ventricular   relaxation (grade 1 diastolic dysfunction). - Aortic valve: There was mild regurgitation. - Left atrium: The atrium was mildly dilated.   Neuro/Psych negative neurological ROS  negative psych ROS   GI/Hepatic Neg liver ROS, GERD  ,  Endo/Other  negative endocrine ROS  Renal/GU negative Renal ROS     Musculoskeletal negative musculoskeletal ROS (+)   Abdominal   Peds  Hematology negative hematology ROS (+)   Anesthesia Other Findings Day of surgery medications reviewed with the patient.  Reproductive/Obstetrics                            Anesthesia Physical Anesthesia Plan  ASA: III  Anesthesia Plan: General and Epidural   Post-op Pain Management: GA combined w/ Regional for post-op pain   Induction:   PONV Risk Score and Plan: 3 and Ondansetron, Dexamethasone and Scopolamine patch - Pre-op  Airway Management Planned: Oral ETT  Additional Equipment:   Intra-op Plan:    Post-operative Plan: Possible Post-op intubation/ventilation  Informed Consent: I have reviewed the patients History and Physical, chart, labs and discussed the procedure including the risks, benefits and alternatives for the proposed anesthesia with the patient or authorized representative who has indicated his/her understanding and acceptance.   Dental advisory given  Plan Discussed with: Anesthesiologist, CRNA and Surgeon  Anesthesia Plan Comments: (ERAS protocol)      Anesthesia Quick Evaluation

## 2017-12-30 ENCOUNTER — Inpatient Hospital Stay (HOSPITAL_COMMUNITY)
Admission: RE | Admit: 2017-12-30 | Discharge: 2018-01-22 | DRG: 326 | Disposition: A | Discharge status: Skilled Nursing Facility | Payer: Medicare Other | Source: Ambulatory Visit | Attending: General Surgery | Admitting: General Surgery

## 2017-12-30 ENCOUNTER — Inpatient Hospital Stay (HOSPITAL_COMMUNITY): Payer: Medicare Other

## 2017-12-30 ENCOUNTER — Inpatient Hospital Stay (HOSPITAL_COMMUNITY): Payer: Medicare Other | Admitting: Anesthesiology

## 2017-12-30 ENCOUNTER — Encounter (HOSPITAL_COMMUNITY): Payer: Self-pay

## 2017-12-30 ENCOUNTER — Encounter (HOSPITAL_COMMUNITY)
Admission: RE | Disposition: A | Discharge status: Skilled Nursing Facility | Payer: Self-pay | Source: Ambulatory Visit | Attending: General Surgery

## 2017-12-30 DIAGNOSIS — Z452 Encounter for adjustment and management of vascular access device: Secondary | ICD-10-CM | POA: Diagnosis not present

## 2017-12-30 DIAGNOSIS — R6521 Severe sepsis with septic shock: Secondary | ICD-10-CM | POA: Diagnosis not present

## 2017-12-30 DIAGNOSIS — C8310 Mantle cell lymphoma, unspecified site: Secondary | ICD-10-CM | POA: Diagnosis present

## 2017-12-30 DIAGNOSIS — J690 Pneumonitis due to inhalation of food and vomit: Secondary | ICD-10-CM | POA: Diagnosis not present

## 2017-12-30 DIAGNOSIS — E43 Unspecified severe protein-calorie malnutrition: Secondary | ICD-10-CM | POA: Diagnosis not present

## 2017-12-30 DIAGNOSIS — R0603 Acute respiratory distress: Secondary | ICD-10-CM | POA: Diagnosis not present

## 2017-12-30 DIAGNOSIS — Y95 Nosocomial condition: Secondary | ICD-10-CM | POA: Diagnosis present

## 2017-12-30 DIAGNOSIS — R100 Acute abdomen: Secondary | ICD-10-CM | POA: Diagnosis not present

## 2017-12-30 DIAGNOSIS — K91841 Postprocedural hemorrhage and hematoma of a digestive system organ or structure following other procedure: Secondary | ICD-10-CM | POA: Diagnosis not present

## 2017-12-30 DIAGNOSIS — M797 Fibromyalgia: Secondary | ICD-10-CM | POA: Diagnosis present

## 2017-12-30 DIAGNOSIS — Z955 Presence of coronary angioplasty implant and graft: Secondary | ICD-10-CM

## 2017-12-30 DIAGNOSIS — R410 Disorientation, unspecified: Secondary | ICD-10-CM | POA: Diagnosis not present

## 2017-12-30 DIAGNOSIS — D649 Anemia, unspecified: Secondary | ICD-10-CM | POA: Diagnosis not present

## 2017-12-30 DIAGNOSIS — D638 Anemia in other chronic diseases classified elsewhere: Secondary | ICD-10-CM | POA: Diagnosis present

## 2017-12-30 DIAGNOSIS — R748 Abnormal levels of other serum enzymes: Secondary | ICD-10-CM | POA: Diagnosis not present

## 2017-12-30 DIAGNOSIS — I110 Hypertensive heart disease with heart failure: Secondary | ICD-10-CM | POA: Diagnosis present

## 2017-12-30 DIAGNOSIS — R4020 Unspecified coma: Secondary | ICD-10-CM | POA: Diagnosis not present

## 2017-12-30 DIAGNOSIS — I2510 Atherosclerotic heart disease of native coronary artery without angina pectoris: Secondary | ICD-10-CM | POA: Diagnosis present

## 2017-12-30 DIAGNOSIS — J984 Other disorders of lung: Secondary | ICD-10-CM | POA: Diagnosis not present

## 2017-12-30 DIAGNOSIS — I248 Other forms of acute ischemic heart disease: Secondary | ICD-10-CM | POA: Diagnosis not present

## 2017-12-30 DIAGNOSIS — E876 Hypokalemia: Secondary | ICD-10-CM | POA: Diagnosis not present

## 2017-12-30 DIAGNOSIS — I10 Essential (primary) hypertension: Secondary | ICD-10-CM | POA: Diagnosis not present

## 2017-12-30 DIAGNOSIS — F05 Delirium due to known physiological condition: Secondary | ICD-10-CM | POA: Diagnosis not present

## 2017-12-30 DIAGNOSIS — H9190 Unspecified hearing loss, unspecified ear: Secondary | ICD-10-CM | POA: Diagnosis present

## 2017-12-30 DIAGNOSIS — G8918 Other acute postprocedural pain: Secondary | ICD-10-CM | POA: Diagnosis not present

## 2017-12-30 DIAGNOSIS — M353 Polymyalgia rheumatica: Secondary | ICD-10-CM | POA: Diagnosis not present

## 2017-12-30 DIAGNOSIS — J9600 Acute respiratory failure, unspecified whether with hypoxia or hypercapnia: Secondary | ICD-10-CM | POA: Diagnosis not present

## 2017-12-30 DIAGNOSIS — R918 Other nonspecific abnormal finding of lung field: Secondary | ICD-10-CM | POA: Diagnosis not present

## 2017-12-30 DIAGNOSIS — I255 Ischemic cardiomyopathy: Secondary | ICD-10-CM | POA: Diagnosis present

## 2017-12-30 DIAGNOSIS — R5383 Other fatigue: Secondary | ICD-10-CM | POA: Diagnosis not present

## 2017-12-30 DIAGNOSIS — E870 Hyperosmolality and hypernatremia: Secondary | ICD-10-CM | POA: Diagnosis present

## 2017-12-30 DIAGNOSIS — T17990A Other foreign object in respiratory tract, part unspecified in causing asphyxiation, initial encounter: Secondary | ICD-10-CM | POA: Diagnosis not present

## 2017-12-30 DIAGNOSIS — C169 Malignant neoplasm of stomach, unspecified: Secondary | ICD-10-CM | POA: Diagnosis not present

## 2017-12-30 DIAGNOSIS — D509 Iron deficiency anemia, unspecified: Secondary | ICD-10-CM | POA: Diagnosis not present

## 2017-12-30 DIAGNOSIS — C801 Malignant (primary) neoplasm, unspecified: Secondary | ICD-10-CM | POA: Diagnosis not present

## 2017-12-30 DIAGNOSIS — J189 Pneumonia, unspecified organism: Secondary | ICD-10-CM | POA: Diagnosis not present

## 2017-12-30 DIAGNOSIS — Z9221 Personal history of antineoplastic chemotherapy: Secondary | ICD-10-CM

## 2017-12-30 DIAGNOSIS — R12 Heartburn: Secondary | ICD-10-CM | POA: Diagnosis not present

## 2017-12-30 DIAGNOSIS — I5042 Chronic combined systolic (congestive) and diastolic (congestive) heart failure: Secondary | ICD-10-CM | POA: Diagnosis present

## 2017-12-30 DIAGNOSIS — L02211 Cutaneous abscess of abdominal wall: Secondary | ICD-10-CM | POA: Diagnosis not present

## 2017-12-30 DIAGNOSIS — G43A1 Cyclical vomiting, intractable: Secondary | ICD-10-CM | POA: Diagnosis not present

## 2017-12-30 DIAGNOSIS — M1991 Primary osteoarthritis, unspecified site: Secondary | ICD-10-CM | POA: Diagnosis present

## 2017-12-30 DIAGNOSIS — L409 Psoriasis, unspecified: Secondary | ICD-10-CM | POA: Diagnosis present

## 2017-12-30 DIAGNOSIS — Z0189 Encounter for other specified special examinations: Secondary | ICD-10-CM

## 2017-12-30 DIAGNOSIS — R2681 Unsteadiness on feet: Secondary | ICD-10-CM | POA: Diagnosis not present

## 2017-12-30 DIAGNOSIS — K210 Gastro-esophageal reflux disease with esophagitis: Secondary | ICD-10-CM | POA: Diagnosis not present

## 2017-12-30 DIAGNOSIS — J90 Pleural effusion, not elsewhere classified: Secondary | ICD-10-CM | POA: Diagnosis not present

## 2017-12-30 DIAGNOSIS — J9811 Atelectasis: Secondary | ICD-10-CM | POA: Diagnosis not present

## 2017-12-30 DIAGNOSIS — R531 Weakness: Secondary | ICD-10-CM | POA: Diagnosis not present

## 2017-12-30 DIAGNOSIS — K219 Gastro-esophageal reflux disease without esophagitis: Secondary | ICD-10-CM | POA: Diagnosis present

## 2017-12-30 DIAGNOSIS — C160 Malignant neoplasm of cardia: Secondary | ICD-10-CM | POA: Diagnosis not present

## 2017-12-30 DIAGNOSIS — E871 Hypo-osmolality and hyponatremia: Secondary | ICD-10-CM | POA: Diagnosis present

## 2017-12-30 DIAGNOSIS — M6281 Muscle weakness (generalized): Secondary | ICD-10-CM | POA: Diagnosis not present

## 2017-12-30 DIAGNOSIS — Z4682 Encounter for fitting and adjustment of non-vascular catheter: Secondary | ICD-10-CM | POA: Diagnosis not present

## 2017-12-30 DIAGNOSIS — A419 Sepsis, unspecified organism: Secondary | ICD-10-CM | POA: Diagnosis not present

## 2017-12-30 DIAGNOSIS — E860 Dehydration: Secondary | ICD-10-CM | POA: Diagnosis present

## 2017-12-30 DIAGNOSIS — J9819 Other pulmonary collapse: Secondary | ICD-10-CM | POA: Diagnosis not present

## 2017-12-30 DIAGNOSIS — Z85028 Personal history of other malignant neoplasm of stomach: Secondary | ICD-10-CM

## 2017-12-30 DIAGNOSIS — G9341 Metabolic encephalopathy: Secondary | ICD-10-CM | POA: Diagnosis present

## 2017-12-30 DIAGNOSIS — R278 Other lack of coordination: Secondary | ICD-10-CM | POA: Diagnosis not present

## 2017-12-30 DIAGNOSIS — E785 Hyperlipidemia, unspecified: Secondary | ICD-10-CM | POA: Diagnosis present

## 2017-12-30 DIAGNOSIS — Z7389 Other problems related to life management difficulty: Secondary | ICD-10-CM | POA: Diagnosis not present

## 2017-12-30 DIAGNOSIS — I4510 Unspecified right bundle-branch block: Secondary | ICD-10-CM | POA: Diagnosis present

## 2017-12-30 DIAGNOSIS — D709 Neutropenia, unspecified: Secondary | ICD-10-CM | POA: Diagnosis present

## 2017-12-30 DIAGNOSIS — C772 Secondary and unspecified malignant neoplasm of intra-abdominal lymph nodes: Secondary | ICD-10-CM | POA: Diagnosis not present

## 2017-12-30 DIAGNOSIS — C162 Malignant neoplasm of body of stomach: Principal | ICD-10-CM | POA: Diagnosis present

## 2017-12-30 DIAGNOSIS — Z978 Presence of other specified devices: Secondary | ICD-10-CM | POA: Diagnosis not present

## 2017-12-30 DIAGNOSIS — B59 Pneumocystosis: Secondary | ICD-10-CM | POA: Diagnosis not present

## 2017-12-30 DIAGNOSIS — R1312 Dysphagia, oropharyngeal phase: Secondary | ICD-10-CM | POA: Diagnosis not present

## 2017-12-30 DIAGNOSIS — R1310 Dysphagia, unspecified: Secondary | ICD-10-CM | POA: Diagnosis not present

## 2017-12-30 DIAGNOSIS — K254 Chronic or unspecified gastric ulcer with hemorrhage: Secondary | ICD-10-CM | POA: Diagnosis not present

## 2017-12-30 DIAGNOSIS — J9601 Acute respiratory failure with hypoxia: Secondary | ICD-10-CM | POA: Diagnosis not present

## 2017-12-30 DIAGNOSIS — Z79899 Other long term (current) drug therapy: Secondary | ICD-10-CM

## 2017-12-30 DIAGNOSIS — R112 Nausea with vomiting, unspecified: Secondary | ICD-10-CM

## 2017-12-30 HISTORY — PX: LAPAROSCOPIC GASTRECTOMY: SHX5894

## 2017-12-30 LAB — CREATININE, SERUM
Creatinine, Ser: 0.9 mg/dL (ref 0.61–1.24)
GFR calc Af Amer: >60 mL/min (ref >60)
GFR calc non Af Amer: >60 mL/min (ref >60)

## 2017-12-30 LAB — CBC
HCT: 21.5 % — ABNORMAL LOW (ref 39.0–52.0)
Hemoglobin: 6.9 g/dL — CL (ref 13.0–17.0)
MCH: 30.4 pg (ref 26.0–34.0)
MCHC: 32.1 g/dL (ref 30.0–36.0)
MCV: 94.7 fL (ref 78.0–100.0)
Platelets: 128 10*3/uL — ABNORMAL LOW (ref 150–400)
RBC: 2.27 MIL/uL — ABNORMAL LOW (ref 4.22–5.81)
RDW: 17.2 % — ABNORMAL HIGH (ref 11.5–15.5)
WBC: 2.3 10*3/uL — ABNORMAL LOW (ref 4.0–10.5)

## 2017-12-30 LAB — PREPARE RBC (CROSSMATCH)

## 2017-12-30 LAB — MRSA PCR SCREENING: MRSA by PCR: NEGATIVE

## 2017-12-30 LAB — HEMOGLOBIN AND HEMATOCRIT, BLOOD
HEMATOCRIT: 21.3 % — AB (ref 39.0–52.0)
HEMOGLOBIN: 6.8 g/dL — AB (ref 13.0–17.0)

## 2017-12-30 LAB — ABO/RH: ABO/RH(D): A NEG

## 2017-12-30 SURGERY — GASTRECTOMY, LAPAROSCOPIC
Anesthesia: Epidural | Site: Abdomen

## 2017-12-30 MED ORDER — DIPHENHYDRAMINE HCL 50 MG/ML IJ SOLN
12.5000 mg | Freq: Four times a day (QID) | INTRAMUSCULAR | Status: DC | PRN
  Administered 2017-12-31 – 2018-01-02 (×3): 12.5 mg via INTRAVENOUS
  Filled 2017-12-30 (×3): qty 1

## 2017-12-30 MED ORDER — LIDOCAINE-EPINEPHRINE 2 %-1:100000 IJ SOLN
INTRAMUSCULAR | Status: DC | PRN
  Administered 2017-12-30 (×2): 4 mL

## 2017-12-30 MED ORDER — FENTANYL CITRATE (PF) 100 MCG/2ML IJ SOLN
INTRAMUSCULAR | Status: AC
  Filled 2017-12-30: qty 2

## 2017-12-30 MED ORDER — PHENYLEPHRINE HCL 10 MG/ML IJ SOLN
INTRAMUSCULAR | Status: DC | PRN
  Administered 2017-12-30 (×3): 80 ug via INTRAVENOUS
  Administered 2017-12-30: 120 ug via INTRAVENOUS

## 2017-12-30 MED ORDER — SODIUM CHLORIDE 0.9 % IV SOLN
Freq: Once | INTRAVENOUS | Status: AC
  Administered 2017-12-30: 1000 mL via INTRAVENOUS

## 2017-12-30 MED ORDER — DEXAMETHASONE SODIUM PHOSPHATE 10 MG/ML IJ SOLN
INTRAMUSCULAR | Status: DC | PRN
  Administered 2017-12-30: 10 mg via INTRAVENOUS

## 2017-12-30 MED ORDER — CEFAZOLIN SODIUM-DEXTROSE 2-4 GM/100ML-% IV SOLN
INTRAVENOUS | Status: AC
  Filled 2017-12-30: qty 100

## 2017-12-30 MED ORDER — PROMETHAZINE HCL 25 MG/ML IJ SOLN: 6.2500 mg | INTRAMUSCULAR | Status: DC | PRN

## 2017-12-30 MED ORDER — ONDANSETRON HCL 4 MG/2ML IJ SOLN
4.0000 mg | Freq: Four times a day (QID) | INTRAMUSCULAR | Status: DC | PRN
  Administered 2017-12-31 – 2018-01-05 (×3): 4 mg via INTRAVENOUS
  Filled 2017-12-30 (×3): qty 2

## 2017-12-30 MED ORDER — DIPHENHYDRAMINE HCL 12.5 MG/5ML PO ELIX: 12.5000 mg | ORAL_SOLUTION | Freq: Four times a day (QID) | ORAL | Status: DC | PRN

## 2017-12-30 MED ORDER — METHOCARBAMOL 1000 MG/10ML IJ SOLN
500.0000 mg | Freq: Three times a day (TID) | INTRAVENOUS | Status: DC | PRN
  Filled 2017-12-30: qty 5

## 2017-12-30 MED ORDER — MIDAZOLAM HCL 2 MG/2ML IJ SOLN
INTRAMUSCULAR | Status: AC
  Filled 2017-12-30: qty 2

## 2017-12-30 MED ORDER — NITROGLYCERIN 0.4 MG SL SUBL: 0.4000 mg | SUBLINGUAL_TABLET | SUBLINGUAL | Status: DC | PRN

## 2017-12-30 MED ORDER — ALBUMIN HUMAN 5 % IV SOLN
INTRAVENOUS | Status: DC | PRN
  Administered 2017-12-30 (×3): via INTRAVENOUS

## 2017-12-30 MED ORDER — ALBUMIN HUMAN 5 % IV SOLN
12.5000 g | Freq: Once | INTRAVENOUS | Status: AC
Start: 2017-12-30 — End: 2017-12-30
  Administered 2017-12-30: 12.5 g via INTRAVENOUS

## 2017-12-30 MED ORDER — CHLORHEXIDINE GLUCONATE CLOTH 2 % EX PADS: 6.0000 | MEDICATED_PAD | Freq: Once | CUTANEOUS | Status: DC

## 2017-12-30 MED ORDER — LIDOCAINE HCL 2 % IJ SOLN
INTRAMUSCULAR | Status: AC
  Filled 2017-12-30: qty 20

## 2017-12-30 MED ORDER — GABAPENTIN 300 MG PO CAPS
300.0000 mg | ORAL_CAPSULE | ORAL | Status: AC
  Administered 2017-12-30: 300 mg via ORAL

## 2017-12-30 MED ORDER — ACETAMINOPHEN 10 MG/ML IV SOLN
1000.0000 mg | Freq: Four times a day (QID) | INTRAVENOUS | Status: AC
  Administered 2017-12-30 – 2017-12-31 (×3): 1000 mg via INTRAVENOUS
  Filled 2017-12-30 (×5): qty 100

## 2017-12-30 MED ORDER — 0.9 % SODIUM CHLORIDE (POUR BTL) OPTIME
TOPICAL | Status: DC | PRN
  Administered 2017-12-30: 2000 mL

## 2017-12-30 MED ORDER — HYDRALAZINE HCL 20 MG/ML IJ SOLN
10.0000 mg | INTRAMUSCULAR | Status: DC | PRN
  Administered 2017-12-31: 10 mg via INTRAVENOUS
  Filled 2017-12-30: qty 1

## 2017-12-30 MED ORDER — PROPOFOL 10 MG/ML IV BOLUS
INTRAVENOUS | Status: AC
  Filled 2017-12-30: qty 20

## 2017-12-30 MED ORDER — ALBUMIN HUMAN 5 % IV SOLN
INTRAVENOUS | Status: AC
  Filled 2017-12-30: qty 250

## 2017-12-30 MED ORDER — SUGAMMADEX SODIUM 200 MG/2ML IV SOLN
INTRAVENOUS | Status: AC
  Filled 2017-12-30: qty 2

## 2017-12-30 MED ORDER — LACTATED RINGERS IV SOLN
INTRAVENOUS | Status: DC | PRN
  Administered 2017-12-30: 07:00:00 via INTRAVENOUS
  Administered 2017-12-30: 1000 mL

## 2017-12-30 MED ORDER — FENTANYL 40 MCG/ML IV SOLN
INTRAVENOUS | Status: DC
  Administered 2017-12-30: 14:00:00 via INTRAVENOUS
  Administered 2017-12-31: 60 ug via INTRAVENOUS
  Administered 2017-12-31: 10 ug via INTRAVENOUS
  Administered 2017-12-31: 30 ug via INTRAVENOUS
  Administered 2018-01-01: 50 ug via INTRAVENOUS
  Administered 2018-01-01: 40 ug via INTRAVENOUS
  Administered 2018-01-01: 10 ug via INTRAVENOUS
  Filled 2017-12-30: qty 25

## 2017-12-30 MED ORDER — LIDOCAINE 2% (20 MG/ML) 5 ML SYRINGE
INTRAMUSCULAR | Status: AC
  Filled 2017-12-30: qty 5

## 2017-12-30 MED ORDER — NALOXONE HCL 0.4 MG/ML IJ SOLN: 0.4000 mg | INTRAMUSCULAR | Status: DC | PRN

## 2017-12-30 MED ORDER — ENOXAPARIN SODIUM 40 MG/0.4ML ~~LOC~~ SOLN: 40.0000 mg | SUBCUTANEOUS | Status: DC

## 2017-12-30 MED ORDER — CEFAZOLIN SODIUM-DEXTROSE 2-4 GM/100ML-% IV SOLN
2.0000 g | INTRAVENOUS | Status: AC
  Administered 2017-12-30: 2 g via INTRAVENOUS

## 2017-12-30 MED ORDER — ACETAMINOPHEN 500 MG PO TABS
ORAL_TABLET | ORAL | Status: AC
  Filled 2017-12-30: qty 2

## 2017-12-30 MED ORDER — FUROSEMIDE 10 MG/ML IJ SOLN
40.0000 mg | Freq: Every day | INTRAMUSCULAR | Status: DC
  Administered 2017-12-30: 40 mg via INTRAVENOUS

## 2017-12-30 MED ORDER — SCOPOLAMINE 1 MG/3DAYS TD PT72
MEDICATED_PATCH | TRANSDERMAL | Status: AC
  Filled 2017-12-30: qty 1

## 2017-12-30 MED ORDER — METOPROLOL TARTRATE 5 MG/5ML IV SOLN
2.5000 mg | Freq: Three times a day (TID) | INTRAVENOUS | Status: AC
  Administered 2017-12-30 – 2017-12-31 (×3): 2.5 mg via INTRAVENOUS
  Filled 2017-12-30 (×3): qty 5

## 2017-12-30 MED ORDER — LIDOCAINE HCL (CARDIAC) 20 MG/ML IV SOLN
INTRAVENOUS | Status: DC | PRN
  Administered 2017-12-30: 100 mg via INTRAVENOUS

## 2017-12-30 MED ORDER — PHENYLEPHRINE 40 MCG/ML (10ML) SYRINGE FOR IV PUSH (FOR BLOOD PRESSURE SUPPORT)
PREFILLED_SYRINGE | INTRAVENOUS | Status: AC
  Filled 2017-12-30: qty 10

## 2017-12-30 MED ORDER — INSULIN ASPART 100 UNIT/ML ~~LOC~~ SOLN
SUBCUTANEOUS | Status: AC
  Filled 2017-12-30: qty 1

## 2017-12-30 MED ORDER — PHENYLEPHRINE HCL 10 MG/ML IJ SOLN
INTRAMUSCULAR | Status: AC
  Filled 2017-12-30: qty 1

## 2017-12-30 MED ORDER — DEXAMETHASONE SODIUM PHOSPHATE 10 MG/ML IJ SOLN
INTRAMUSCULAR | Status: AC
  Filled 2017-12-30: qty 1

## 2017-12-30 MED ORDER — ROPIVACAINE HCL 2 MG/ML IJ SOLN
12.0000 mL/h | INTRAMUSCULAR | Status: DC
  Administered 2017-12-30 (×2): 12 mL/h via EPIDURAL
  Administered 2017-12-30: 10 mL/h via EPIDURAL
  Administered 2017-12-31 – 2018-01-02 (×4): 12 mL/h via EPIDURAL
  Filled 2017-12-30 (×14): qty 200

## 2017-12-30 MED ORDER — ALBUMIN HUMAN 5 % IV SOLN
INTRAVENOUS | Status: AC
  Filled 2017-12-30: qty 500

## 2017-12-30 MED ORDER — LIDOCAINE-EPINEPHRINE (PF) 1 %-1:200000 IJ SOLN
INTRAMUSCULAR | Status: AC
  Filled 2017-12-30: qty 30

## 2017-12-30 MED ORDER — GABAPENTIN 300 MG PO CAPS
ORAL_CAPSULE | ORAL | Status: AC
  Filled 2017-12-30: qty 1

## 2017-12-30 MED ORDER — FUROSEMIDE 10 MG/ML IJ SOLN
INTRAMUSCULAR | Status: AC
  Administered 2017-12-30: 40 mg via INTRAVENOUS
  Filled 2017-12-30: qty 4

## 2017-12-30 MED ORDER — FENTANYL CITRATE (PF) 100 MCG/2ML IJ SOLN
25.0000 ug | INTRAMUSCULAR | Status: DC | PRN
  Administered 2017-12-30 (×2): 50 ug via INTRAVENOUS

## 2017-12-30 MED ORDER — ACETAMINOPHEN 10 MG/ML IV SOLN
INTRAVENOUS | Status: AC
  Filled 2017-12-30: qty 100

## 2017-12-30 MED ORDER — ROCURONIUM BROMIDE 100 MG/10ML IV SOLN
INTRAVENOUS | Status: DC | PRN
  Administered 2017-12-30: 10 mg via INTRAVENOUS
  Administered 2017-12-30: 20 mg via INTRAVENOUS
  Administered 2017-12-30: 60 mg via INTRAVENOUS

## 2017-12-30 MED ORDER — DIPHENHYDRAMINE HCL 50 MG/ML IJ SOLN: 12.5000 mg | Freq: Four times a day (QID) | INTRAMUSCULAR | Status: DC | PRN

## 2017-12-30 MED ORDER — ONDANSETRON HCL 4 MG/2ML IJ SOLN: 4.0000 mg | Freq: Four times a day (QID) | INTRAMUSCULAR | Status: DC | PRN

## 2017-12-30 MED ORDER — ALBUMIN HUMAN 5 % IV SOLN
12.5000 g | Freq: Once | INTRAVENOUS | Status: AC
  Administered 2017-12-30: 12.5 g via INTRAVENOUS

## 2017-12-30 MED ORDER — FENTANYL CITRATE (PF) 100 MCG/2ML IJ SOLN
INTRAMUSCULAR | Status: DC | PRN
  Administered 2017-12-30 (×3): 50 ug via INTRAVENOUS

## 2017-12-30 MED ORDER — ONDANSETRON HCL 4 MG/2ML IJ SOLN
INTRAMUSCULAR | Status: DC | PRN
  Administered 2017-12-30: 4 mg via INTRAVENOUS

## 2017-12-30 MED ORDER — LACTATED RINGERS IR SOLN
Status: DC | PRN
  Administered 2017-12-30: 1000 mL

## 2017-12-30 MED ORDER — KCL IN DEXTROSE-NACL 40-5-0.45 MEQ/L-%-% IV SOLN
INTRAVENOUS | Status: DC
  Administered 2017-12-30: 75 mL/h via INTRAVENOUS
  Administered 2017-12-31: 08:00:00 via INTRAVENOUS
  Filled 2017-12-30 (×2): qty 1000

## 2017-12-30 MED ORDER — KETAMINE HCL 10 MG/ML IJ SOLN
INTRAMUSCULAR | Status: AC
  Filled 2017-12-30: qty 1

## 2017-12-30 MED ORDER — PANTOPRAZOLE SODIUM 40 MG IV SOLR
40.0000 mg | Freq: Every day | INTRAVENOUS | Status: DC
  Administered 2017-12-30 – 2018-01-01 (×3): 40 mg via INTRAVENOUS
  Filled 2017-12-30 (×3): qty 40

## 2017-12-30 MED ORDER — ACETAMINOPHEN 500 MG PO TABS
1000.0000 mg | ORAL_TABLET | ORAL | Status: AC
  Administered 2017-12-30: 1000 mg via ORAL

## 2017-12-30 MED ORDER — LIDOCAINE 2% (20 MG/ML) 5 ML SYRINGE
INTRAMUSCULAR | Status: DC | PRN
  Administered 2017-12-30: 1 mg/kg/h via INTRAVENOUS

## 2017-12-30 MED ORDER — BUPIVACAINE HCL (PF) 0.25 % IJ SOLN
INTRAMUSCULAR | Status: DC | PRN
  Administered 2017-12-30: 5 mL

## 2017-12-30 MED ORDER — ROCURONIUM BROMIDE 10 MG/ML (PF) SYRINGE
PREFILLED_SYRINGE | INTRAVENOUS | Status: AC
  Filled 2017-12-30: qty 5

## 2017-12-30 MED ORDER — BUPIVACAINE HCL (PF) 0.25 % IJ SOLN
INTRAMUSCULAR | Status: AC
  Filled 2017-12-30: qty 30

## 2017-12-30 MED ORDER — SCOPOLAMINE 1 MG/3DAYS TD PT72
MEDICATED_PATCH | TRANSDERMAL | Status: DC | PRN
  Administered 2017-12-30: 1 via TRANSDERMAL

## 2017-12-30 MED ORDER — SUGAMMADEX SODIUM 200 MG/2ML IV SOLN
INTRAVENOUS | Status: DC | PRN
  Administered 2017-12-30: 200 mg via INTRAVENOUS

## 2017-12-30 MED ORDER — FENTANYL CITRATE (PF) 250 MCG/5ML IJ SOLN
INTRAMUSCULAR | Status: AC
  Filled 2017-12-30: qty 5

## 2017-12-30 MED ORDER — ONDANSETRON 4 MG PO TBDP: 4.0000 mg | ORAL_TABLET | Freq: Four times a day (QID) | ORAL | Status: DC | PRN

## 2017-12-30 MED ORDER — MIDAZOLAM HCL 5 MG/5ML IJ SOLN
INTRAMUSCULAR | Status: DC | PRN
  Administered 2017-12-30: 1 mg via INTRAVENOUS

## 2017-12-30 MED ORDER — ONDANSETRON HCL 4 MG/2ML IJ SOLN
INTRAMUSCULAR | Status: AC
  Filled 2017-12-30: qty 2

## 2017-12-30 MED ORDER — PHENYLEPHRINE HCL 10 MG/ML IJ SOLN
INTRAVENOUS | Status: DC | PRN
  Administered 2017-12-30: 50 ug/min via INTRAVENOUS

## 2017-12-30 MED ORDER — SODIUM CHLORIDE 0.9% FLUSH: 9.0000 mL | INTRAVENOUS | Status: DC | PRN

## 2017-12-30 MED ORDER — CEFAZOLIN SODIUM-DEXTROSE 2-4 GM/100ML-% IV SOLN
2.0000 g | Freq: Three times a day (TID) | INTRAVENOUS | Status: AC
  Administered 2017-12-30: 2 g via INTRAVENOUS

## 2017-12-30 MED ORDER — LACTATED RINGERS IV SOLN
INTRAVENOUS | Status: DC | PRN
  Administered 2017-12-30 (×3): 1000 mL

## 2017-12-30 MED ORDER — DEXTROSE 5 % IV SOLN: INTRAVENOUS | Status: DC | PRN

## 2017-12-30 MED ORDER — METOPROLOL TARTRATE 5 MG/5ML IV SOLN
INTRAVENOUS | Status: AC
  Administered 2017-12-30: 2.5 mg via INTRAVENOUS
  Filled 2017-12-30: qty 5

## 2017-12-30 MED ORDER — PHENYLEPHRINE HCL-NACL 10-0.9 MG/250ML-% IV SOLN: 0.0000 ug/min | INTRAVENOUS | Status: DC

## 2017-12-30 MED ORDER — LIDOCAINE-EPINEPHRINE 1 %-1:100000 IJ SOLN
INTRAMUSCULAR | Status: DC | PRN
  Administered 2017-12-30: 5 mL

## 2017-12-30 SURGICAL SUPPLY — 82 items
APPLIER CLIP ROT 10 11.4 M/L (STAPLE) ×3
APR CLP MED LRG 11.4X10 (STAPLE) ×1
BLADE EXTENDED COATED 6.5IN (ELECTRODE) IMPLANT
CATH KIT ON-Q SILVERSOAK 5 (CATHETERS) IMPLANT
CATH KIT ON-Q SILVERSOAK 5IN (CATHETERS) IMPLANT
CATH MALLECOT 28FR (CATHETERS) IMPLANT
CHLORAPREP W/TINT 26ML (MISCELLANEOUS) ×3 IMPLANT
CLIP APPLIE ROT 10 11.4 M/L (STAPLE) ×1 IMPLANT
CLIP VESOCCLUDE LG 6/CT (CLIP) IMPLANT
CLIP VESOCCLUDE MED LG 6/CT (CLIP) IMPLANT
COVER MAYO STAND STRL (DRAPES) ×3 IMPLANT
DRAIN CHANNEL 19F RND (DRAIN) IMPLANT
DRAPE LAPAROSCOPIC ABDOMINAL (DRAPES) ×3 IMPLANT
DRAPE UTILITY XL STRL (DRAPES) IMPLANT
DRAPE WARM FLUID 44X44 (DRAPE) ×3 IMPLANT
DRESSING TELFA ISLAND 4X8 (GAUZE/BANDAGES/DRESSINGS) IMPLANT
DRSG OPSITE POSTOP 4X12 (GAUZE/BANDAGES/DRESSINGS) ×2 IMPLANT
DRSG TELFA PLUS 4X6 ADH ISLAND (GAUZE/BANDAGES/DRESSINGS) IMPLANT
ELECT PENCIL ROCKER SW 15FT (MISCELLANEOUS) IMPLANT
ELECT REM PT RETURN 15FT ADLT (MISCELLANEOUS) ×3 IMPLANT
EVACUATOR SILICONE 100CC (DRAIN) IMPLANT
GAUZE SPONGE 2X2 8PLY STRL LF (GAUZE/BANDAGES/DRESSINGS) IMPLANT
GAUZE SPONGE 4X4 12PLY STRL (GAUZE/BANDAGES/DRESSINGS) IMPLANT
GLOVE BIO SURGEON STRL SZ 6 (GLOVE) ×3 IMPLANT
GLOVE BIOGEL PI IND STRL 6.5 (GLOVE) ×1 IMPLANT
GLOVE BIOGEL PI INDICATOR 6.5 (GLOVE) ×2
GOWN STRL REUS W/TWL 2XL LVL3 (GOWN DISPOSABLE) ×3 IMPLANT
GOWN STRL REUS W/TWL XL LVL3 (GOWN DISPOSABLE) ×9 IMPLANT
KIT BASIN OR (CUSTOM PROCEDURE TRAY) ×3 IMPLANT
PAD POSITIONING PINK XL (MISCELLANEOUS) ×2 IMPLANT
POSITIONER SURGICAL ARM (MISCELLANEOUS) IMPLANT
RELOAD PROXIMATE 75MM BLUE (ENDOMECHANICALS) ×3 IMPLANT
RELOAD PROXIMATE 75MM GREEN (ENDOMECHANICALS) ×3 IMPLANT
RELOAD STAPLE 60 2.6 WHT THN (STAPLE) IMPLANT
RELOAD STAPLE 60 3.6 BLU REG (STAPLE) IMPLANT
RELOAD STAPLE 60 4.1 GRN THCK (STAPLE) IMPLANT
RELOAD STAPLE 75 3.8 BLU REG (ENDOMECHANICALS) IMPLANT
RELOAD STAPLE 75 4.5 GRN THCK (ENDOMECHANICALS) IMPLANT
RELOAD STAPLER BLUE 60MM (STAPLE) IMPLANT
RELOAD STAPLER GREEN 60MM (STAPLE) IMPLANT
RELOAD STAPLER LINE PROX 30 GR (STAPLE) ×1 IMPLANT
RELOAD STAPLER WHITE 60MM (STAPLE) IMPLANT
SCISSORS LAP 5X35 DISP (ENDOMECHANICALS) ×3 IMPLANT
SEALER TISSUE X1 CVD JAW (INSTRUMENTS) IMPLANT
SET IRRIG TUBING LAPAROSCOPIC (IRRIGATION / IRRIGATOR) IMPLANT
SHEARS FOC LG CVD HARMONIC 17C (MISCELLANEOUS) ×2 IMPLANT
SHEARS HARMONIC ACE PLUS 36CM (ENDOMECHANICALS) ×2 IMPLANT
SLEEVE XCEL OPT CAN 5 100 (ENDOMECHANICALS) IMPLANT
SPONGE DRAIN TRACH 4X4 STRL 2S (GAUZE/BANDAGES/DRESSINGS) IMPLANT
SPONGE GAUZE 2X2 STER 10/PKG (GAUZE/BANDAGES/DRESSINGS) ×2
SPONGE LAP 18X18 X RAY DECT (DISPOSABLE) ×3 IMPLANT
STAPLE ECHEON FLEX 60 POW ENDO (STAPLE) IMPLANT
STAPLER PROXIMAT 75MM THCK GRN (MISCELLANEOUS) ×2 IMPLANT
STAPLER RELOAD BLUE 60MM (STAPLE)
STAPLER RELOAD GREEN 60MM (STAPLE)
STAPLER RELOAD LINE PROX 30 GR (STAPLE) ×3
STAPLER RELOAD WHITE 60MM (STAPLE)
STAPLER RELOADABLE 30 GRN THCK (STAPLE) IMPLANT
STAPLER VISISTAT 35W (STAPLE) IMPLANT
SUCTION POOLE TIP (SUCTIONS) ×2 IMPLANT
SUT ETHILON 2 0 PS N (SUTURE) ×8 IMPLANT
SUT PDS AB 1 TP1 96 (SUTURE) ×4 IMPLANT
SUT PDS AB 2-0 CT2 27 (SUTURE) IMPLANT
SUT PDS AB 3-0 SH 27 (SUTURE) ×8 IMPLANT
SUT SILK 2 0 (SUTURE)
SUT SILK 2 0 SH CR/8 (SUTURE) IMPLANT
SUT SILK 2-0 18XBRD TIE 12 (SUTURE) IMPLANT
SUT SILK 3 0 (SUTURE)
SUT SILK 3 0 SH CR/8 (SUTURE) ×6 IMPLANT
SUT SILK 3-0 18XBRD TIE 12 (SUTURE) IMPLANT
SYR BULB IRRIGATION 50ML (SYRINGE) ×2 IMPLANT
TAPE CLOTH 4X10 WHT NS (GAUZE/BANDAGES/DRESSINGS) IMPLANT
TAPE CLOTH SURG 4X10 WHT LF (GAUZE/BANDAGES/DRESSINGS) ×2 IMPLANT
TOWEL OR 17X26 10 PK STRL BLUE (TOWEL DISPOSABLE) ×3 IMPLANT
TOWEL OR NON WOVEN STRL DISP B (DISPOSABLE) ×3 IMPLANT
TRAY FOLEY W/METER SILVER 16FR (SET/KITS/TRAYS/PACK) ×3 IMPLANT
TRAY LAPAROSCOPIC (CUSTOM PROCEDURE TRAY) ×3 IMPLANT
TROCAR XCEL BLUNT TIP 100MML (ENDOMECHANICALS) IMPLANT
TUBING INSUF HEATED (TUBING) IMPLANT
TUNNELER SHEATH ON-Q 16GX12 DP (PAIN MANAGEMENT) IMPLANT
YANKAUER SUCT BULB TIP 10FT TU (MISCELLANEOUS) ×3 IMPLANT
YANKAUER SUCT BULB TIP NO VENT (SUCTIONS) IMPLANT

## 2017-12-30 NOTE — Progress Notes (Signed)
Assisted Dr. Tobias Alexander with Epidural insertion. Side rails up, monitors on throughout procedure. See vital signs in flow sheet. Tolerated Procedure well.

## 2017-12-30 NOTE — Interval H&P Note (Signed)
History and Physical Interval Note:  12/30/2017 7:38 AM  [name].  has presented today for surgery, with the diagnosis of gastric cancer  The various methods of treatment have been discussed with the patient and family. After consideration of risks, benefits and other options for treatment, the patient has consented to  Procedure(s) with comments: DIAGNOSTIC LAPAROSCOPY PARTIAL GASTRECTOMY VS SUBTOTAL AND PLACEMENT JEJUNOSTOMY TUBE (N/A) - EPIDURAL as a surgical intervention .  The patient's history has been reviewed, patient examined, no change in status, stable for surgery.  I have reviewed the patient's chart and labs.  Questions were answered to the patient's satisfaction.     Stark Klein

## 2017-12-30 NOTE — Anesthesia Procedure Notes (Signed)
Procedure Name: Intubation Date/Time: 12/30/2017 7:54 AM Performed by: Glory Buff, CRNA Pre-anesthesia Checklist: Patient identified, Emergency Drugs available, Suction available and Patient being monitored Patient Re-evaluated:Patient Re-evaluated prior to induction Oxygen Delivery Method: Circle system utilized Preoxygenation: Pre-oxygenation with 100% oxygen Induction Type: IV induction Ventilation: Mask ventilation without difficulty Laryngoscope Size: Miller and 3 Grade View: Grade I Tube type: Subglottic suction tube Tube size: 8.0 mm Number of attempts: 1 Airway Equipment and Method: Stylet and Oral airway Placement Confirmation: ETT inserted through vocal cords under direct vision,  positive ETCO2 and breath sounds checked- equal and bilateral Secured at: 22 cm Tube secured with: Tape Dental Injury: Teeth and Oropharynx as per pre-operative assessment

## 2017-12-30 NOTE — Op Note (Signed)
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    SURGEON:  Surgeon(s): Stark Klein, MD  ASSISTANT:   Adonis Housekeeper, MD  ANESTHESIA:   General and epidural  DRAINS: jejunostomy tube, 16 Fr  LOCAL MEDICATIONS USED:  BUPIVICAINE  and LIDOCAINE   SPECIMEN:  Source of Specimen:  distal stomach  DISPOSITION OF SPECIMEN:  PATHOLOGY  COUNTS:  YES  DICTATION: .Dragon Dictation  PLAN OF CARE: Admit to inpatient   PATIENT DISPOSITION:  PACU - hemodynamically stable.  FINDINGS:  No evidence of carcinomatosis, mass near greater curve, distal BODY of stomach, no enlarged LN.  EBL: 50 mL  PROCEDURE:    The patient was identified in the holding area and was taken to the OR where he was placed supine on the operating room table.  General anesthesia was induced.  Arms were tucked, and foley catheter was placed.  Abdomen was prepped and draped in sterile fashion.  Timeout was performed according to the surgical safety checklist.  When all was correct, we continued.    The patient was rotated to the left and placed into reverse trendelenburg position.  Local anesthetic was administered.  A 5 mm incision was made at the costal margin and a 5 mm Optiview trocar was placed under direct visualization.  Pneumoperitoneum was achieved to a pressure of 15 mm Hg.  The abdomen was examined with the camera and no evidence of carcinomatosis was seen.  An upper midline incision was made in the abdomen and the abdomen was then manually explored.  The stomach was mobile, and the pelvis and undersurface of the liver was palpated.  Again, no carcinomatosis was identified.    The Bookwalter was used to assist with visualization.  The stomach was mobilized.  The omentum was taken off the colon and the lesser sac was  entered.  The Harmonic was used to take down the gastroepiploics and the edge of the lesser curve and greater curve were exposed.  Two green loads of the GIA 75 mm stapler were used to divide the stomach. The harmonic was then used to dissect superiorly and inferiorly.  Once the duodenal bulb was identified, it was divided with another load of the GIA stapler.  The specimen was marked and sent to pathology.  The staple line of the duodenum and the stomach were oversewn with 3-0 PDS suture.    The gastrojejunostomy was then performed.  This was antecolic and retrogastric.  The jejunum was secured to the posterior wall of the stomach with two 3-0 silk sutures.  The stomach and jejunum were opened, and a blue load of the 75 mm stapler was used to create a stapled linear anastamosis. The NGT was advanced into the efferent limb of the jejunum, and the defect was closed with two 3-0 PDS sutures in running [place] fashion.  The NGT was secured.  Additional silk sutures were used as anti-tension sutures.    A jejunostomy tube was placed in the left mid abdomen.  The 16 Fr replacement J tube was used.  It was passed through the abdominal wall. A pursestring suture was placed in the jejunum.  The bowel was opened.  The tube was passed distally.  1.5 mL saline was placed into the balloon.  The pursestring suture was tied down.  Three Witzel sutures were placed.  The jejunum was then pexed to the  abdominal wall with 2-0 silk sutures to minimize risk of volvulus.    The abdomen was then irrigated.  A sponge count was performed.   The fascia was then closed with two #1 looped PDS sutures.  The skin was irrigated and closed with staples.  The OnQ catheters were advanced into the tunneler sheaths and the sheaths were removed.  The abdomen was then cleaned, dried, and dressed with dry sterile dressings.    Needle, sponge, and instrument counts were correct x 2.  The patient was allowed to emerge from anesthesia and taken to  the PACU in stable condition.

## 2017-12-30 NOTE — Transfer of Care (Signed)
Immediate Anesthesia Transfer of Care Note  Patient: [name].  Procedure(s) Performed: DIAGNOSTIC LAPAROSCOPY WITH OPEN DISTAL GASTRECTOMY AND PLACEMENT JEJUNOSTOMY FEEDING TUBE (N/A Abdomen)  Patient Location: PACU  Anesthesia Type:General  Level of Consciousness: awake, alert  and oriented  Airway & Oxygen Therapy: Patient Spontanous Breathing and Patient connected to face mask oxygen  Post-op Assessment: Report given to RN and Post -op Vital signs reviewed and stable  Post vital signs: Reviewed and stable  Last Vitals:  Vitals:   12/30/17 0722 12/30/17 0723  BP:    Pulse: 72 78  Resp: 15 19  Temp:    SpO2: 100% 100%    Last Pain:  Vitals:   12/30/17 0645  TempSrc:   PainSc: 0-No pain      Patients Stated Pain Goal: 4 (24/23/53 6144)  Complications: No apparent anesthesia complications

## 2017-12-30 NOTE — Anesthesia Procedure Notes (Addendum)
Epidural Patient location during procedure: holding area Start time: 12/30/2017 7:14 AM End time: 12/30/2017 7:24 AM  Staffing Anesthesiologist: Duane Boston, MD Performed: anesthesiologist   Preanesthetic Checklist Completed: patient identified, site marked, pre-op evaluation, timeout performed, IV checked, risks and benefits discussed and monitors and equipment checked  Epidural Patient position: sitting Prep: DuraPrep Patient monitoring: heart rate, cardiac monitor, continuous pulse ox and blood pressure Approach: midline Location: thoracic (1-12) Injection technique: LOR saline  Needle:  Needle type: Tuohy  Needle gauge: 17 G Needle length: 9 cm Needle insertion depth: 5 cm Catheter size: 20 Guage Catheter at skin depth: 10 cm Test dose: negative, Other and 2% lidocaine with Epi 1:200 K  Assessment Events: blood not aspirated, injection not painful, no injection resistance and negative IV test  Additional Notes Informed consent obtained prior to proceeding including risk of failure, 1% risk of PDPH, risk of minor discomfort and bruising.  Discussed rare but serious complications including epidural abscess, permanent nerve injury, epidural hematoma.  Discussed alternatives to epidural analgesia and patient desires to proceed.  Timeout performed pre-procedure verifying patient name, procedure, and platelet count.  Patient tolerated procedure well. Reason for block:at surgeon's request and post-op pain management

## 2017-12-30 NOTE — Anesthesia Postprocedure Evaluation (Signed)
Anesthesia Post Note  Patient: [name].  Procedure(s) Performed: DIAGNOSTIC LAPAROSCOPY WITH OPEN DISTAL GASTRECTOMY AND PLACEMENT JEJUNOSTOMY FEEDING TUBE (N/A Abdomen)     Patient location during evaluation: PACU Anesthesia Type: Epidural Level of consciousness: sedated Pain management: pain level controlled Vital Signs Assessment: post-procedure vital signs reviewed and stable Respiratory status: spontaneous breathing and respiratory function stable Cardiovascular status: stable Postop Assessment: no apparent nausea or vomiting Anesthetic complications: no    Last Vitals:  Vitals:   12/30/17 1400 12/30/17 1415  BP: 139/76 (!) 146/73  Pulse: 81 78  Resp: (!) 9 12  Temp: (!) 36.3 C (!) 36.3 C  SpO2: 100% 100%    Last Pain:  Vitals:   12/30/17 1415  TempSrc:   PainSc: Asleep                 Shivan Hodes DANIEL

## 2017-12-31 ENCOUNTER — Inpatient Hospital Stay (HOSPITAL_COMMUNITY): Payer: Medicare Other | Admitting: Anesthesiology

## 2017-12-31 ENCOUNTER — Encounter (HOSPITAL_COMMUNITY): Payer: Self-pay | Admitting: General Surgery

## 2017-12-31 LAB — TYPE AND SCREEN
ABO/RH(D): A NEG
Antibody Screen: NEGATIVE
UNIT DIVISION: 0
Unit division: 0

## 2017-12-31 LAB — CBC
HCT: 31.2 % — ABNORMAL LOW (ref 39.0–52.0)
HEMOGLOBIN: 10.1 g/dL — AB (ref 13.0–17.0)
MCH: 30.5 pg (ref 26.0–34.0)
MCHC: 32.4 g/dL (ref 30.0–36.0)
MCV: 94.3 fL (ref 78.0–100.0)
PLATELETS: 175 10*3/uL (ref 150–400)
RBC: 3.31 MIL/uL — AB (ref 4.22–5.81)
RDW: 17.5 % — ABNORMAL HIGH (ref 11.5–15.5)
WBC: 7.6 10*3/uL (ref 4.0–10.5)

## 2017-12-31 LAB — BPAM RBC
BLOOD PRODUCT EXPIRATION DATE: 201903202359
Blood Product Expiration Date: 201903202359
ISSUE DATE / TIME: 201903051259
ISSUE DATE / TIME: 201903051531
Unit Type and Rh: 600
Unit Type and Rh: 600

## 2017-12-31 LAB — BASIC METABOLIC PANEL
ANION GAP: 9 (ref 5–15)
BUN: 22 mg/dL — ABNORMAL HIGH (ref 6–20)
CO2: 28 mmol/L (ref 22–32)
Calcium: 8.7 mg/dL — ABNORMAL LOW (ref 8.9–10.3)
Chloride: 108 mmol/L (ref 101–111)
Creatinine, Ser: 1.04 mg/dL (ref 0.61–1.24)
Glucose, Bld: 125 mg/dL — ABNORMAL HIGH (ref 65–99)
POTASSIUM: 4 mmol/L (ref 3.5–5.1)
SODIUM: 145 mmol/L (ref 135–145)

## 2017-12-31 LAB — GLUCOSE, CAPILLARY
GLUCOSE-CAPILLARY: 87 mg/dL (ref 65–99)
GLUCOSE-CAPILLARY: 93 mg/dL (ref 65–99)
Glucose-Capillary: 89 mg/dL (ref 65–99)
Glucose-Capillary: 92 mg/dL (ref 65–99)

## 2017-12-31 MED ORDER — KCL IN DEXTROSE-NACL 20-5-0.45 MEQ/L-%-% IV SOLN
INTRAVENOUS | Status: DC
  Administered 2017-12-31 – 2018-01-08 (×9): via INTRAVENOUS
  Filled 2017-12-31 (×12): qty 1000

## 2017-12-31 MED ORDER — VITAL HIGH PROTEIN PO LIQD
1000.0000 mL | ORAL | Status: DC
Start: 2017-12-31 — End: 2017-12-31

## 2017-12-31 MED ORDER — OSMOLITE 1.2 CAL PO LIQD
1000.0000 mL | ORAL | Status: DC
  Administered 2017-12-31: 1000 mL

## 2017-12-31 NOTE — Progress Notes (Signed)
1 Day Post-Op   Subjective/Chief Complaint: Pt c/o pain across upper abdomen.  No nausea.  Thirsty.  Had 6 L UOP yesterday.     Objective: Vital signs in last 24 hours: Temp:  [94 F (34.4 C)-98.4 F (36.9 C)] 98.4 F (36.9 C) (03/06 0800) Pulse Rate:  [47-107] 54 (03/06 0800) Resp:  [9-22] 22 (03/06 0902) BP: (71-175)/(40-104) 136/57 (03/06 0800) SpO2:  [94 %-100 %] 100 % (03/06 0902) Weight:  [83.2 kg (183 lb 6.8 oz)] 83.2 kg (183 lb 6.8 oz) (03/05 1800)    Intake/Output from previous day: 03/05 0701 - 03/06 0700 In: 4314.9 [I.V.:2792.5; Blood:800; IV Piggyback:600] Out: 6220 [Urine:6025; Emesis/NG output:170; Blood:25] Intake/Output this shift: Total I/O In: 150 [I.V.:150] Out: -   General appearance: cooperative, mild distress and a little sleepy, but arousable Resp: breathing comfortably Cardio: regular rhythm, bradycardic GI: soft, non distended.  some serosang staining on dressing.  J tube in place. Extremities: extremities normal, atraumatic, no cyanosis or edema  Lab Results:  Recent Labs    12/30/17 1120 12/30/17 1220 12/31/17 0321  WBC 2.3*  --  7.6  HGB 6.9* 6.8* 10.1*  HCT 21.5* 21.3* 31.2*  PLT 128*  --  175   BMET Recent Labs    12/30/17 1120 12/31/17 0321  NA  --  145  K  --  4.0  CL  --  108  CO2  --  28  GLUCOSE  --  125*  BUN  --  22*  CREATININE 0.90 1.04  CALCIUM  --  8.7*   PT/INR No results for input(s): LABPROT, INR in the last 72 hours. ABG No results for input(s): PHART, HCO3 in the last 72 hours.  Invalid input(s): PCO2, PO2  Studies/Results: Dg Abd Portable 1v  Result Date: 12/31/2017 CLINICAL DATA:  NGT placement EXAM: PORTABLE ABDOMEN - 1 VIEW COMPARISON:  12/16/2017 CT FINDINGS: Tip and side port of a gastric tube are noted in the left upper quadrant of the abdomen presumably in the expected location of the stomach. Left-sided approach catheter is also noted with tip projecting over the lower lumbar spine at the level  of L5. Midline skin staples are present as well as chain sutures in the left upper quadrant. Stool is noted within large bowel without bowel obstruction. No apparent free air. IMPRESSION: Gastric tube is seen in the left upper quadrant in the expected location of the stomach. Electronically Signed   By: Ashley Royalty M.D.   On: 12/31/2017 00:05    Anti-infectives: Anti-infectives (From admission, onward)   Start     Dose/Rate Route Frequency Ordered Stop   12/30/17 1714  ceFAZolin (ANCEF) 2-4 GM/100ML-% IVPB    Comments:  Otelia Sergeant   : cabinet override      12/30/17 1714 12/31/17 0529   12/30/17 1600  ceFAZolin (ANCEF) IVPB 2g/100 mL premix     2 g 200 mL/hr over 30 Minutes Intravenous Every 8 hours 12/30/17 1356 12/30/17 1747   12/30/17 0545  ceFAZolin (ANCEF) 2-4 GM/100ML-% IVPB    Comments:  Waldron Session   : cabinet override      12/30/17 0545 12/30/17 0756   12/30/17 0542  ceFAZolin (ANCEF) IVPB 2g/100 mL premix     2 g 200 mL/hr over 30 Minutes Intravenous On call to O.R. 12/30/17 0542 12/30/17 1610      Assessment/Plan: s/p Procedure(s) with comments: DIAGNOSTIC LAPAROSCOPY WITH OPEN DISTAL GASTRECTOMY AND PLACEMENT JEJUNOSTOMY FEEDING TUBE (N/A) - EPIDURAL NPO/NGT  Increase IVF  and hold lasix today given - 2L and thirsty. Epidural and reduced dose PCA for pain control.  ? Increase epidural. Leave foley for diuresis and epidural in place. PT consult and OOB to chair today. Nutrition consult and starting trophic feeds today.   Giving IV metoprolol while holding coreg for HTN , CAD, and mildly depressed systolic function. Gastric cancer- await path Mantle cell lymphoma- NED Chronic anemia from chemotherapy and chronic blood loss anemia + acute blood loss anemia - transfused 2 units yesterday, approp response.  Will follow Mild stress induced hyperglycemia - does not need treatment at this point. PMR - no treatment at this time. GERD - on PPI IV Essential HTN- on  metoprolol, prn hydralazine.  Got 1 dose lasix IV.  Holding today given significant diuresis.  Will prob restart tomorrow.     LOS: 1 day    Stark Klein 12/31/2017

## 2017-12-31 NOTE — Progress Notes (Signed)
PT Cancellation Note  Patient Details Name: [name]. MRN: [id number] DOB: Mar 12, 1933   Cancelled Treatment:    Reason Eval/Treat Not Completed: Fatigue/lethargy limiting ability to participate Pt up in chair for an hour and just now back to bed with nursing.  Not feeling up to ambulating at this time.  Will check back as schedule permits.   Taylar Hartsough,KATHrine E 12/31/2017, 10:06 AM Carmelia Bake, PT, DPT 12/31/2017 Pager: [phone]

## 2017-12-31 NOTE — Progress Notes (Signed)
Initial Nutrition Assessment  DOCUMENTATION CODES:   Not applicable  INTERVENTION:  - Will order Osmolite 1.2 @ 20 mL/hr which will provide 576 kcal, 27 grams of protein, and 394 mL free water.  - Goal rate for TF: Osmolite 1.2 @ 85 mL/hr which will provide 2448 kcal, 113 grams of protein (90% minimum estimated protein need), and 1673 mL free water.   NUTRITION DIAGNOSIS:   Increased nutrient needs related to catabolic illness, cancer and cancer related treatments, wound healing as evidenced by estimated needs.  GOAL:   Patient will meet greater than or equal to 90% of their needs  MONITOR:   TF tolerance, Weight trends, Labs  REASON FOR ASSESSMENT:   Consult Enteral/tube feeding initiation and management  (Plan to start trickle feeds at 20 ml/hr tomorrow. Will leave at 20 ml/hr for at least 24 hours and maybe longer.)   ASSESSMENT:   84 year old male with gastric cancer who presented with an abnormal PET scan on follow-up for lymphoma (Stage IV, dx 08/2016).  He had a gastric mass seen on the PET scan and upper endoscopy confirmed gastric cancer in this location. The patient denies any issues in appetite.  He has not had any weight loss. His energy level has improved significantly since his treatment for lymphoma and is s/p chemo therapy for lymphoma. He has also seen radiation oncology. His bone marrow bx was negative for marrow involvement with lymphoma.   BMI indicates normal weight. Pt is NPO and is POD #1 diagnostic laparoscopy, distal (partial) gastrectomy with Billroth II anastomosis and J-tube placement. NGT placed during procedure and remains in place with ~100cc output this AM.   No family/visitors present. Flow sheet indicates that pt is a/o x4, but pt seemed confused during RD assessment. He reported abdominal pain around J-tube site, but was unable to provide any other pertinent information. He continuously asked RD to get his TV remote (which was lying next to his  arm) and then RD informed him of this he stated that he wanted his remote from home and that RD needed to go get it. When informed about plan to start nutrition today via J-tube pt stated "they were supposed to put a tube in but I don't know think I got one." Pt then stated feelings of weakness and wanting to get out of the chair.  Will attempt to obtain more nutrition-related information/hx at follow-up. Per chart review, weight has been stable since 12/13/118.   Medications reviewed. Labs reviewed.  IVF: D5-1/2 NS-40 mEq KCl @ 75 mL/hr (306 kcal).       NUTRITION - FOCUSED PHYSICAL EXAM:  Completed/assessed with no muscle or fat wasting, no edema.   Diet Order:  Diet NPO time specified  EDUCATION NEEDS:   Not appropriate for education at this time  Skin:  Skin Assessment: Skin Integrity Issues: Skin Integrity Issues:: Incisions Incisions: abdominal (3/5)  Last BM:  PTA/unknown  Height:   Ht Readings from Last 1 Encounters:  12/30/17 6' (1.829 m)    Weight:   Wt Readings from Last 1 Encounters:  12/30/17 183 lb 6.8 oz (83.2 kg)    Ideal Body Weight:  80.91 kg  BMI:  Body mass index is 24.88 kg/m.  Estimated Nutritional Needs:   Kcal:  2330-2500 (28-30 kcal/kg)  Protein:  125-140 grams (1.5-1.7 grams/kg)  Fluid:  >/= 2.3 L/day      Jarome Matin, MS, RD, LDN, CNSC Inpatient Clinical Dietitian Pager # [phone] After hours/weekend pager #  [phone]  

## 2017-12-31 NOTE — Care Management Note (Signed)
Case Management Note  Patient Details  Name: [name]. MRN: [id number] Date of Birth: 19-Dec-1932  Subjective/Objective: Received CM referral for HHC-J TF. Spoke to spouse in rm-she wants to discuss [place] tomorrow.                   Action/Plan:d/c home w/HHC.   Expected Discharge Date:                  Expected Discharge Plan:  [place]  In-House Referral:     Discharge planning Services  CM Consult  Post Acute Care Choice:    Choice offered to:     DME Arranged:    DME Agency:     HH Arranged:    [place] Agency:     Status of Service:  In process, will continue to follow  If discussed at Long Length of Stay Meetings, dates discussed:    Additional Comments:  Dessa Phi, RN 12/31/2017, 2:51 PM

## 2017-12-31 NOTE — Addendum Note (Signed)
Addendum  created 12/31/17 1259 by Belinda Block, MD   Sign clinical note

## 2017-12-31 NOTE — Progress Notes (Signed)
PT Cancellation Note  Patient Details Name: [name]. MRN: [id number] DOB: 09-01-1933   Cancelled Treatment:    Reason Eval/Treat Not Completed: Other (comment) Checked back on pt this afternoon.  Pt is slightly confused and worried about his car, able state he is at [facility].  He declined OOB at this time.    Ahnyla Mendel,KATHrine E 12/31/2017, 3:05 PM Carmelia Bake, PT, DPT 12/31/2017 Pager: [phone]

## 2017-12-31 NOTE — Anesthesia Post-op Follow-up Note (Signed)
  Anesthesia Pain Follow-up Note  Patient: [name].  Day #: 1  Date of Follow-up: 12/31/2017 Time: 12:58 PM  Last Vitals:  Vitals:   12/31/17 1200 12/31/17 1242  BP:    Pulse:    Resp:  20  Temp: 36.4 C   SpO2:  98%    Level of Consciousness: alert  Pain: mild   Side Effects:None  Catheter Site Exam:clean, dry  Epidural / Intrathecal (From admission, onward)   Start     Dose/Rate Route Frequency Ordered Stop   12/30/17 0715  ropivacaine (PF) 2 mg/mL (0.2%) (NAROPIN) injection     12 mL/hr 12 mL/hr  Epidural Continuous 12/30/17 0710         Plan: Continue current therapy of postop epidural at surgeon's request. Case discussed with nurse.  Naarah Borgerding

## 2018-01-01 LAB — GLUCOSE, CAPILLARY
GLUCOSE-CAPILLARY: 83 mg/dL (ref 65–99)
Glucose-Capillary: 113 mg/dL — ABNORMAL HIGH (ref 65–99)
Glucose-Capillary: 104 mg/dL — ABNORMAL HIGH (ref 65–99)
Glucose-Capillary: 100 mg/dL — ABNORMAL HIGH (ref 65–99)
Glucose-Capillary: 93 mg/dL (ref 65–99)

## 2018-01-01 LAB — CBC
HEMATOCRIT: 34 % — AB (ref 39.0–52.0)
HEMATOCRIT: 33.4 % — AB (ref 39.0–52.0)
HEMOGLOBIN: 10.5 g/dL — AB (ref 13.0–17.0)
Hemoglobin: 10.7 g/dL — ABNORMAL LOW (ref 13.0–17.0)
MCH: 29.9 pg (ref 26.0–34.0)
MCH: 29.9 pg (ref 26.0–34.0)
MCHC: 31.5 g/dL (ref 30.0–36.0)
MCHC: 31.4 g/dL (ref 30.0–36.0)
MCV: 95 fL (ref 78.0–100.0)
MCV: 95.2 fL (ref 78.0–100.0)
PLATELETS: 177 10*3/uL (ref 150–400)
Platelets: 173 10*3/uL (ref 150–400)
RBC: 3.58 MIL/uL — ABNORMAL LOW (ref 4.22–5.81)
RBC: 3.51 MIL/uL — ABNORMAL LOW (ref 4.22–5.81)
RDW: 17.5 % — AB (ref 11.5–15.5)
RDW: 17.2 % — AB (ref 11.5–15.5)
WBC: 6.2 10*3/uL (ref 4.0–10.5)
WBC: 5.2 10*3/uL (ref 4.0–10.5)

## 2018-01-01 LAB — BASIC METABOLIC PANEL
Anion gap: 7 (ref 5–15)
Anion gap: 8 (ref 5–15)
BUN: 15 mg/dL (ref 6–20)
BUN: 20 mg/dL (ref 6–20)
CHLORIDE: 109 mmol/L (ref 101–111)
CHLORIDE: 110 mmol/L (ref 101–111)
CO2: 26 mmol/L (ref 22–32)
CO2: 23 mmol/L (ref 22–32)
Calcium: 8.9 mg/dL (ref 8.9–10.3)
Calcium: 8.6 mg/dL — ABNORMAL LOW (ref 8.9–10.3)
Creatinine, Ser: 0.92 mg/dL (ref 0.61–1.24)
Creatinine, Ser: 0.99 mg/dL (ref 0.61–1.24)
GFR calc Af Amer: >60 mL/min (ref >60)
GFR calc Af Amer: >60 mL/min (ref >60)
GFR calc non Af Amer: >60 mL/min (ref >60)
GLUCOSE: 121 mg/dL — AB (ref 65–99)
GLUCOSE: 116 mg/dL — AB (ref 65–99)
POTASSIUM: 3.9 mmol/L (ref 3.5–5.1)
POTASSIUM: 4.1 mmol/L (ref 3.5–5.1)
Sodium: 142 mmol/L (ref 135–145)
Sodium: 141 mmol/L (ref 135–145)

## 2018-01-01 MED ORDER — ACETAMINOPHEN 10 MG/ML IV SOLN
1000.0000 mg | Freq: Four times a day (QID) | INTRAVENOUS | Status: AC
  Administered 2018-01-01 – 2018-01-02 (×3): 1000 mg via INTRAVENOUS
  Filled 2018-01-01 (×4): qty 100

## 2018-01-01 MED ORDER — ENALAPRILAT 1.25 MG/ML IV SOLN
0.6250 mg | Freq: Four times a day (QID) | INTRAVENOUS | Status: DC | PRN
  Filled 2018-01-01: qty 1

## 2018-01-01 MED ORDER — HALOPERIDOL LACTATE 5 MG/ML IJ SOLN
1.0000 mg | Freq: Four times a day (QID) | INTRAMUSCULAR | Status: DC | PRN
  Administered 2018-01-01 – 2018-01-03 (×3): 1 mg via INTRAVENOUS
  Filled 2018-01-01 (×3): qty 1

## 2018-01-01 MED ORDER — METOPROLOL TARTRATE 5 MG/5ML IV SOLN
5.0000 mg | Freq: Four times a day (QID) | INTRAVENOUS | Status: DC | PRN
  Administered 2018-01-02 (×3): 5 mg via INTRAVENOUS
  Filled 2018-01-01 (×3): qty 5

## 2018-01-01 MED ORDER — OSMOLITE 1.2 CAL PO LIQD: 1000.0000 mL | ORAL | Status: DC

## 2018-01-01 MED ORDER — LACTATED RINGERS IV BOLUS (SEPSIS)
1000.0000 mL | Freq: Once | INTRAVENOUS | Status: AC
  Administered 2018-01-01: 1000 mL via INTRAVENOUS

## 2018-01-01 NOTE — Addendum Note (Signed)
Addendum  created 01/01/18 0935 by Roderic Palau, MD   Sign clinical note

## 2018-01-01 NOTE — Evaluation (Signed)
Physical Therapy Evaluation Patient Details Name: [name]. MRN: [id number] DOB: 1933-08-14 Today's Date: 01/01/2018   History of Present Illness  84 yo male admitted 12/30/17 with recurrent gastric cancer. S/P DIAGNOSTIC LAPAROSCOPY WITH OPEN DISTAL GASTRECTOMY AND PLACEMENT JEJUNOSTOMY FEEDING TUBE (N/A) - EPIDURAL  Clinical Impression  The patient is strong enough to stand and pivot to Recliner with 2 assist and 1 assist to manage lines. The opatient may benefit from post acute rehab. Pt admitted with above diagnosis. Pt currently with functional limitations due to the deficits listed below (see PT Problem List).  Pt will benefit from skilled PT to increase their independence and safety with mobility to allow discharge to the venue listed below.  '    Follow Up Recommendations SNF    Equipment Recommendations  None recommended by PT    Recommendations for Other Services       Precautions / Restrictions Precautions Precautions: Fall Precaution Comments: Epidural, Tube feeding, NG suction,       Mobility  Bed Mobility Overal bed mobility: Needs Assistance Bed Mobility: Supine to Sit     Supine to sit: Mod assist;+2 for physical assistance;+2 for safety/equipment     General bed mobility comments: multimodal cues for technique, extra time to fiollow directions  Transfers Overall transfer level: Needs assistance Equipment used: Rolling walker (2 wheeled) Transfers: Sit to/from [facility] Sit to Stand: +2 physical assistance;+2 safety/equipment;Min assist;Mod assist Stand pivot transfers: +2 physical assistance;+2 safety/equipment;Min assist;Mod assist       General transfer comment: RN present to assist with lines, epidural, TF. Extra time to rise, multimodal cues to stay on task, easily distracted. Able to stand with min to mod assistance , able to take small steps to recliner, cues for safety and minding lines and tubes/  Ambulation/Gait                Stairs            Wheelchair Mobility    Modified Rankin (Stroke Patients Only)       Balance                                             Pertinent Vitals/Pain Pain Assessment: Faces Faces Pain Scale: Hurts little more Pain Location: abdomen Pain Descriptors / Indicators: Discomfort;Grimacing Pain Intervention(s): Limited activity within patient's tolerance;Monitored during session(epidural)    Home Living Family/patient expects to be discharged to:: Private residence Living Arrangements: Spouse/significant other Available Help at Discharge: Family Type of Home: House Home Access: Stairs to enter Entrance Stairs-Rails: None   Home Layout: One level Home Equipment: Environmental consultant - 2 wheels;Cane - single point      Prior Function Level of Independence: Independent               Hand Dominance        Extremity/Trunk Assessment   Upper Extremity Assessment Upper Extremity Assessment: Generalized weakness    Lower Extremity Assessment Lower Extremity Assessment: Generalized weakness    Cervical / Trunk Assessment Cervical / Trunk Assessment: Normal  Communication   Communication: No difficulties  Cognition Arousal/Alertness: Awake/alert Behavior During Therapy: Restless Overall Cognitive Status: Impaired/Different from baseline Area of Impairment: Orientation                 Orientation Level: Time;Situation  General Comments: oriented [place]      General Comments      Exercises     Assessment/Plan    PT Assessment Patient needs continued PT services  PT Problem List Decreased strength;Decreased range of motion;Decreased cognition;Decreased knowledge of use of DME;Decreased activity tolerance;Decreased safety awareness;Decreased knowledge of precautions;Decreased mobility       PT Treatment Interventions DME instruction;Therapeutic exercise;Gait training;Functional mobility  training;Therapeutic activities;Patient/family education    PT Goals (Current goals can be found in the Care Plan section)  Acute Rehab PT Goals Patient Stated Goal: per wife, to walk, go home PT Goal Formulation: With family Time For Goal Achievement: 01/15/18 Potential to Achieve Goals: Fair    Frequency Min 2X/week   Barriers to discharge Decreased caregiver support      Co-evaluation               AM-PAC PT "6 Clicks" Daily Activity  Outcome Measure Difficulty turning over in bed (including adjusting bedclothes, sheets and blankets)?: Unable Difficulty moving from lying on back to sitting on the side of the bed? : Unable Difficulty sitting down on and standing up from a chair with arms (e.g., wheelchair, bedside commode, etc,.)?: Unable Help needed moving to and from a bed to chair (including a wheelchair)?: Total Help needed walking in hospital room?: Total Help needed climbing 3-5 steps with a railing? : Total 6 Click Score: 6    End of Session Equipment Utilized During Treatment: Gait belt Activity Tolerance: Patient tolerated treatment well Patient left: in bed;with call bell/phone within reach;with family/visitor present;with chair alarm set Nurse Communication: Mobility status PT Visit Diagnosis: Unsteadiness on feet (R26.81)    Time: 6301-6010 PT Time Calculation (min) (ACUTE ONLY): 40 min   Charges:   PT Evaluation $PT Eval High Complexity: 1 High PT Treatments $Therapeutic Activity: 23-37 mins   PT G CodesTresa Endo PT [phone]   Claretha Cooper 01/01/2018, 12:54 PM

## 2018-01-01 NOTE — Progress Notes (Signed)
2 Days Post-Op   Subjective/Chief Complaint: Pt confused overnight.  Trying to get out of bed.  He does know he is in the hospital and wants to go home.  Poor judgement and understanding about why he can't go yet.  Denies nausea.  No flatus.  Tube feeds started yesterday.     Objective: Vital signs in last 24 hours: Temp:  [97.6 F (36.4 C)-99.2 F (37.3 C)] 99.2 F (37.3 C) (03/07 0800) Pulse Rate:  [57-119] 80 (03/07 0900) Resp:  [14-25] 19 (03/07 0900) BP: (134-189)/(48-97) 166/76 (03/07 0900) SpO2:  [86 %-100 %] 100 % (03/07 0900)    Intake/Output from previous day: 03/06 0701 - 03/07 0700 In: 3168 [I.V.:2300; NG/GT:360; IV Piggyback:100] Out: 2730 [Urine:2580; Emesis/NG output:150] Intake/Output this shift: Total I/O In: 264 [I.V.:200; Other:24; NG/GT:40] Out: -   General appearance:  Trying to get out of the bed.   Resp: breathing comfortably Cardio: regular rhythm, bradycardic GI: soft, non distended.  some serosang staining on dressing.  J tube in place with feeds going. Extremities: extremities normal, atraumatic, no cyanosis or edema  Lab Results:  Recent Labs    12/31/17 0321 01/01/18 0302  WBC 7.6 6.2  HGB 10.1* 10.7*  HCT 31.2* 34.0*  PLT 175 177   BMET Recent Labs    12/31/17 0321 01/01/18 0302  NA 145 142  K 4.0 3.9  CL 108 109  CO2 28 26  GLUCOSE 125* 121*  BUN 22* 15  CREATININE 1.04 0.92  CALCIUM 8.7* 8.9   PT/INR No results for input(s): LABPROT, INR in the last 72 hours. ABG No results for input(s): PHART, HCO3 in the last 72 hours.  Invalid input(s): PCO2, PO2  Studies/Results: Dg Abd Portable 1v  Result Date: 12/31/2017 CLINICAL DATA:  NGT placement EXAM: PORTABLE ABDOMEN - 1 VIEW COMPARISON:  12/16/2017 CT FINDINGS: Tip and side port of a gastric tube are noted in the left upper quadrant of the abdomen presumably in the expected location of the stomach. Left-sided approach catheter is also noted with tip projecting over the  lower lumbar spine at the level of L5. Midline skin staples are present as well as chain sutures in the left upper quadrant. Stool is noted within large bowel without bowel obstruction. No apparent free air. IMPRESSION: Gastric tube is seen in the left upper quadrant in the expected location of the stomach. Electronically Signed   By: Ashley Royalty M.D.   On: 12/31/2017 00:05    Anti-infectives: Anti-infectives (From admission, onward)   Start     Dose/Rate Route Frequency Ordered Stop   12/30/17 1714  ceFAZolin (ANCEF) 2-4 GM/100ML-% IVPB    Comments:  Otelia Sergeant   : cabinet override      12/30/17 1714 12/31/17 0529   12/30/17 1600  ceFAZolin (ANCEF) IVPB 2g/100 mL premix     2 g 200 mL/hr over 30 Minutes Intravenous Every 8 hours 12/30/17 1356 12/30/17 1747   12/30/17 0545  ceFAZolin (ANCEF) 2-4 GM/100ML-% IVPB    Comments:  Waldron Session   : cabinet override      12/30/17 0545 12/30/17 0756   12/30/17 0542  ceFAZolin (ANCEF) IVPB 2g/100 mL premix     2 g 200 mL/hr over 30 Minutes Intravenous On call to O.R. 12/30/17 7322 12/30/17 0254      Assessment/Plan: s/p Procedure(s) with comments: DIAGNOSTIC LAPAROSCOPY WITH OPEN DISTAL GASTRECTOMY AND PLACEMENT JEJUNOSTOMY FEEDING TUBE (N/A) - EPIDURAL NPO/NGT  Hold lasix until tomorrow.   Epidural for  pain control.  D/c PCA since pt developed ICU delirium Leave foley for diuresis and epidural in place. Add low dose haldol for icu delirium.  Restraints to keep him from pulling out/off medically necessary devices and getting out of bed (fall risk given connections) OOB to chair today if reoriented.   Nutrition consult.  Start increasing feeds today.  Would not increase more than 20 mL every 12 hours to goal.    Giving IV metoprolol while holding coreg for HTN , CAD, and mildly depressed systolic function. Gastric cancer- await path Mantle cell lymphoma- NED Chronic anemia from chemotherapy and chronic blood loss anemia + acute blood  loss anemia - stable Mild stress induced hyperglycemia - does not need treatment at this point. PMR - no treatment at this time. GERD - on PPI IV Essential HTN- on metoprolol, prn hydralazine.  Got 1 dose lasix IV.    ICU delirium.  Work on sleep/wake cycle today.  D/c all narcotics and benzos.     LOS: 2 days    Stark Klein 01/01/2018

## 2018-01-01 NOTE — Progress Notes (Signed)
Physical Therapy Treatment Patient Details Name: [name]. MRN: [id number] DOB: [birth year] Today's Date: 01/01/2018    History of Present Illness 84 yo male admitted 12/30/17 with recurrent gastric cancer. S/P DIAGNOSTIC LAPAROSCOPY WITH OPEN DISTAL GASTRECTOMY AND PLACEMENT JEJUNOSTOMY FEEDING TUBE (N/A) - EPIDURAL    PT Comments    The  Patient is restless in the  Recliner, sleeping . Aroused patient to assist with 2 assist to stand and pivot back to bed. Patient fell asleep.  Continue PT.   Follow Up Recommendations  SNF     Equipment Recommendations  None recommended by PT    Recommendations for Other Services       Precautions / Restrictions Precautions Precautions: Fall Precaution Comments: Epidural, Tube feeding, NG suction,     Mobility  Bed Mobility Overal bed mobility: Needs Assistance Bed Mobility: Sit to Supine     Supine to sit: Mod assist;+2 for physical assistance;+2 for safety/equipment Sit to supine: Mod assist   General bed mobility comments: assist with legs onto bed, assist with trunk  Transfers Overall transfer level: Needs assistance Equipment used: Rolling walker (2 wheeled) Transfers: Sit to/from Stand;Stand Pivot Transfers Sit to Stand: +2 physical assistance;+2 safety/equipment;Min assist;Mod assist Stand pivot transfers: +2 physical assistance;+2 safety/equipment;Min assist;Mod assist       General transfer comment: assist to rise from the recliner. Multimodal cueas for safety. patient is fidgety.   Ambulation/Gait                 Stairs            Wheelchair Mobility    Modified Rankin (Stroke Patients Only)       Balance                                            Cognition Arousal/Alertness: Lethargic Behavior During Therapy: Restless Overall Cognitive Status: Impaired/Different from baseline Area of Impairment: Orientation                 Orientation Level:  Time;Situation             General Comments: aroused to mobilize, then back to sleep after return to supine      Exercises      General Comments        Pertinent Vitals/Pain Pain Assessment: Faces Faces Pain Scale: No hurt Pain Location: abdomen Pain Descriptors / Indicators: Discomfort;Grimacing Pain Intervention(s): Limited activity within patient's tolerance;Monitored during session(epidural)    Home Living Family/patient expects to be discharged to:: Private residence Living Arrangements: Spouse/significant other Available Help at Discharge: Family Type of Home: House Home Access: Stairs to enter Entrance Stairs-Rails: None Home Layout: One level Home Equipment: Environmental consultant - 2 wheels;Cane - single point      Prior Function Level of Independence: Independent          PT Goals (current goals can now be found in the care plan section) Acute Rehab PT Goals Patient Stated Goal: per wife, to walk, go home PT Goal Formulation: With family Time For Goal Achievement: 01/15/18 Potential to Achieve Goals: Fair Progress towards PT goals: Progressing toward goals    Frequency    Min 2X/week      PT Plan Current plan remains appropriate    Co-evaluation              AM-PAC PT "6 Clicks" Daily Activity  Outcome Measure  Difficulty turning over in bed (including adjusting bedclothes, sheets and blankets)?: Unable Difficulty moving from lying on back to sitting on the side of the bed? : Unable Difficulty sitting down on and standing up from a chair with arms (e.g., wheelchair, bedside commode, etc,.)?: Unable Help needed moving to and from a bed to chair (including a wheelchair)?: Total Help needed walking in hospital room?: Total Help needed climbing 3-5 steps with a railing? : Total 6 Click Score: 6    End of Session Equipment Utilized During Treatment: Gait belt Activity Tolerance: Patient tolerated treatment well Patient left: in bed;with call  bell/phone within reach;with bed alarm set;with family/visitor present;with restraints reapplied Nurse Communication: Mobility status PT Visit Diagnosis: Unsteadiness on feet (R26.81)     Time: 5573-2202 PT Time Calculation (min) (ACUTE ONLY): 10 min  Charges:  $Therapeutic Activity: 8-22 mins                    G CodesTresa Endo PT [phone]    Claretha Cooper 01/01/2018, 2:45 PM

## 2018-01-01 NOTE — Anesthesia Post-op Follow-up Note (Signed)
  Anesthesia Pain Follow-up Note  Patient: [name].  Day #: 2  Date of Follow-up: 01/01/2018 Time: 9:30 AM  Last Vitals:  Vitals:   01/01/18 0831 01/01/18 0900  BP:  (!) 166/76  Pulse:  80  Resp: 19 19  Temp:    SpO2: 99% 100%    Level of Consciousness: alert  Pain: none   Side Effects:None  Catheter Site Exam:clean, dry, no drainage  Epidural / Intrathecal (From admission, onward)   Start     Dose/Rate Route Frequency Ordered Stop   12/30/17 0715  ropivacaine (PF) 2 mg/mL (0.2%) (NAROPIN) injection     12 mL/hr 12 mL/hr  Epidural Continuous 12/30/17 0710         Plan: Continue current therapy of postop epidural at surgeon's request.  Kenya Kook,W. EDMOND

## 2018-01-01 NOTE — Progress Notes (Signed)
Notified by RoNiqua (primary nurse) that pt's BP 79/24.  Immediately in to check on patient - pt is alert but intermittently confused, able to state that he is in the hospital and his first name, but other conversation is confused, which is close to his baseline.  Rechecked BP and rechecked in opposite arm with no major differences, remained in 69I systolic.  Started a 539ml NS bolus per rapid response protocol for low BP and advised primary RN to notify [facility].  Breifly reviewed chart, did not see any recent medications that were given that could drop BP, no obvious signs of bleeding noted, HR is 80s, unsure of reason for drop in BP.  Dr. Johney Maine responded to the telephone page to [place].  RoNiqua briefly described situation and advised Dr. Johney Maine that I (charge RN) was in the room and Dr. Johney Maine asked to speak with me.  I again advised of situation and advised Dr. Johney Maine that BP was in 50'T systolic on both right and left arms, Dr. Johney Maine requested a manual BP, orders given for CBC and BMP  And 1L IVF bolus, and asked me to talk with anesthesia regarding if the medication infusing in the epidural needed to be held due to the hypotension.  I called down to OR to talk with anesthesia and reached Angie who is a Immunologist, she advised me that the anesthesiologist do not allow them to change orders on epidurals here at [facility] and she gave me the phone number to the one that was on-call tonight, which is [phone].  At 2130, I was about to call the anesthesiologist and the BP resulted 118/89.  Due to hypotension currently resolved did not call anesthesiologist to discuss if the Ropivacaine needed to be held.  [place] ICU/SD Care Coordinator / Rapid Response Nurse

## 2018-01-01 NOTE — Progress Notes (Signed)
Nutrition Follow-up  DOCUMENTATION CODES:   Not applicable  INTERVENTION:  Will adjust TF regimen: increase Osmolite 1.2 to 30 mL/hr and advance by 10 mL/hr every 6 hours to reach goal rate of Osmolite 1.2 @ 85 mL/hr.  Goal rate for TF: Osmolite 1.2 @ 85 mL/hr which will provide 2448 kcal, 113 grams of protein (90% minimum estimated protein need), and 1673 mL free water.      NUTRITION DIAGNOSIS:   Increased nutrient needs related to catabolic illness, cancer and cancer related treatments, wound healing as evidenced by estimated needs. -ongoing  GOAL:   Patient will meet greater than or equal to 90% of their needs -unmet with current TF rate.   MONITOR:   TF tolerance, Weight trends, Labs  ASSESSMENT:   84 year old male with gastric cancer who presented with an abnormal PET scan on follow-up for lymphoma (Stage IV, dx 08/2016).  He had a gastric mass seen on the PET scan and upper endoscopy confirmed gastric cancer in this location. The patient denies any issues in appetite.  He has not had any weight loss. His energy level has improved significantly since his treatment for lymphoma and is s/p chemo therapy for lymphoma. He has also seen radiation oncology. His bone marrow bx was negative for marrow involvement with lymphoma.   Pt with J-tube and receiving Osmolite 1.2 @ 20 mL/hr which provides 576 kcal, 27 grams of protein, and 394 mL free water. Dr. Marlowe Aschoff note from today states okay to start advancing, do not advance more than 20 mL/hr each 12 hours. Will adjust TF regimen as outlined above.  Medications reviewed. Labs reviewed; CBGs: 113, 104, and 83 mg/dL today.   IVF: D5-1/2 NS-20 mEq KCl @ 100 mL/hr (408 kcal).    Diet Order:  Diet NPO time specified  EDUCATION NEEDS:   Not appropriate for education at this time  Skin:  Skin Assessment: Skin Integrity Issues: Skin Integrity Issues:: Incisions Incisions: abdominal (3/5)  Last BM:  PTA/unknown  Height:   Ht  Readings from Last 1 Encounters:  12/30/17 6' (1.829 m)    Weight:   Wt Readings from Last 1 Encounters:  12/30/17 183 lb 6.8 oz (83.2 kg)    Ideal Body Weight:  80.91 kg  BMI:  Body mass index is 24.88 kg/m.  Estimated Nutritional Needs:   Kcal:  2330-2500 (28-30 kcal/kg)  Protein:  125-140 grams (1.5-1.7 grams/kg)  Fluid:  >/= 2.3 L/day      Jarome Matin, MS, RD, LDN, [facility] Inpatient Clinical Dietitian Pager # [phone] After hours/weekend pager # [phone]

## 2018-01-02 ENCOUNTER — Encounter (HOSPITAL_COMMUNITY): Payer: Self-pay | Admitting: *Deleted

## 2018-01-02 ENCOUNTER — Inpatient Hospital Stay (HOSPITAL_COMMUNITY): Payer: Medicare Other

## 2018-01-02 ENCOUNTER — Inpatient Hospital Stay (HOSPITAL_COMMUNITY): Payer: Medicare Other | Admitting: Certified Registered Nurse Anesthetist

## 2018-01-02 DIAGNOSIS — R410 Disorientation, unspecified: Secondary | ICD-10-CM

## 2018-01-02 LAB — CBC
HEMATOCRIT: 34.3 % — AB (ref 39.0–52.0)
HEMOGLOBIN: 10.8 g/dL — AB (ref 13.0–17.0)
MCH: 30 pg (ref 26.0–34.0)
MCHC: 31.5 g/dL (ref 30.0–36.0)
MCV: 95.3 fL (ref 78.0–100.0)
Platelets: 168 10*3/uL (ref 150–400)
RBC: 3.6 MIL/uL — AB (ref 4.22–5.81)
RDW: 16.9 % — ABNORMAL HIGH (ref 11.5–15.5)
WBC: 5.8 10*3/uL (ref 4.0–10.5)

## 2018-01-02 LAB — GLUCOSE, CAPILLARY
GLUCOSE-CAPILLARY: 98 mg/dL (ref 65–99)
GLUCOSE-CAPILLARY: 98 mg/dL (ref 65–99)
GLUCOSE-CAPILLARY: 121 mg/dL — AB (ref 65–99)
GLUCOSE-CAPILLARY: 110 mg/dL — AB (ref 65–99)
GLUCOSE-CAPILLARY: 138 mg/dL — AB (ref 65–99)
Glucose-Capillary: 98 mg/dL (ref 65–99)

## 2018-01-02 LAB — COMPREHENSIVE METABOLIC PANEL
ALBUMIN: 3.2 g/dL — AB (ref 3.5–5.0)
ALT: 14 U/L — ABNORMAL LOW (ref 17–63)
AST: 29 U/L (ref 15–41)
Alkaline Phosphatase: 53 U/L (ref 38–126)
Anion gap: 9 (ref 5–15)
BUN: 16 mg/dL (ref 6–20)
CHLORIDE: 111 mmol/L (ref 101–111)
CO2: 20 mmol/L — ABNORMAL LOW (ref 22–32)
Calcium: 8.3 mg/dL — ABNORMAL LOW (ref 8.9–10.3)
Creatinine, Ser: 0.79 mg/dL (ref 0.61–1.24)
GFR calc Af Amer: >60 mL/min (ref >60)
GFR calc non Af Amer: >60 mL/min (ref >60)
GLUCOSE: 114 mg/dL — AB (ref 65–99)
Potassium: 4.7 mmol/L (ref 3.5–5.1)
SODIUM: 140 mmol/L (ref 135–145)
Total Bilirubin: 0.8 mg/dL (ref 0.3–1.2)
Total Protein: 5.4 g/dL — ABNORMAL LOW (ref 6.5–8.1)

## 2018-01-02 LAB — BASIC METABOLIC PANEL
Anion gap: 7 (ref 5–15)
BUN: 20 mg/dL (ref 6–20)
CHLORIDE: 113 mmol/L — AB (ref 101–111)
CO2: 23 mmol/L (ref 22–32)
Calcium: 8.4 mg/dL — ABNORMAL LOW (ref 8.9–10.3)
Creatinine, Ser: 0.87 mg/dL (ref 0.61–1.24)
GFR calc Af Amer: >60 mL/min (ref >60)
GFR calc non Af Amer: >60 mL/min (ref >60)
Glucose, Bld: 131 mg/dL — ABNORMAL HIGH (ref 65–99)
POTASSIUM: 3.9 mmol/L (ref 3.5–5.1)
SODIUM: 143 mmol/L (ref 135–145)

## 2018-01-02 LAB — TROPONIN I: TROPONIN I: 0.06 ng/mL — AB (ref <0.03)

## 2018-01-02 LAB — LACTIC ACID, PLASMA: Lactic Acid, Venous: 0.9 mmol/L (ref 0.5–1.9)

## 2018-01-02 LAB — AMMONIA: Ammonia: 28 umol/L (ref 9–35)

## 2018-01-02 MED ORDER — PHENYLEPHRINE 40 MCG/ML (10ML) SYRINGE FOR IV PUSH (FOR BLOOD PRESSURE SUPPORT)
200.0000 ug | PREFILLED_SYRINGE | Freq: Once | INTRAVENOUS | Status: AC
  Administered 2018-01-02: 200 ug via INTRAVENOUS
  Filled 2018-01-02: qty 5

## 2018-01-02 MED ORDER — SUCCINYLCHOLINE CHLORIDE 20 MG/ML IJ SOLN
INTRAMUSCULAR | Status: DC | PRN
  Administered 2018-01-02: 120 mg via INTRAVENOUS

## 2018-01-02 MED ORDER — PHENYLEPHRINE HCL-NACL 10-0.9 MG/250ML-% IV SOLN
INTRAVENOUS | Status: AC
  Filled 2018-01-02: qty 250

## 2018-01-02 MED ORDER — ETOMIDATE 2 MG/ML IV SOLN
INTRAVENOUS | Status: DC | PRN
  Administered 2018-01-02: 20 mg via INTRAVENOUS

## 2018-01-02 MED ORDER — SUCCINYLCHOLINE CHLORIDE 200 MG/10ML IV SOSY
PREFILLED_SYRINGE | INTRAVENOUS | Status: AC
  Filled 2018-01-02: qty 10

## 2018-01-02 MED ORDER — SODIUM CHLORIDE 0.9 % IV BOLUS (SEPSIS)
1000.0000 mL | Freq: Once | INTRAVENOUS | Status: AC
  Administered 2018-01-02: 1000 mL via INTRAVENOUS

## 2018-01-02 MED ORDER — MIDAZOLAM HCL 2 MG/2ML IJ SOLN
1.0000 mg | INTRAMUSCULAR | Status: DC | PRN
  Filled 2018-01-02: qty 2

## 2018-01-02 MED ORDER — CHLORHEXIDINE GLUCONATE 0.12% ORAL RINSE (MEDLINE KIT)
15.0000 mL | Freq: Two times a day (BID) | OROMUCOSAL | Status: DC
  Administered 2018-01-03 – 2018-01-04 (×4): 15 mL via OROMUCOSAL

## 2018-01-02 MED ORDER — MIDAZOLAM HCL 2 MG/2ML IJ SOLN
1.0000 mg | INTRAMUSCULAR | Status: DC | PRN
  Administered 2018-01-02 (×2): 1 mg via INTRAVENOUS
  Filled 2018-01-02: qty 2

## 2018-01-02 MED ORDER — SODIUM CHLORIDE 0.9 % IV BOLUS (SEPSIS)
500.0000 mL | Freq: Once | INTRAVENOUS | Status: AC
  Administered 2018-01-02: 500 mL via INTRAVENOUS

## 2018-01-02 MED ORDER — ETOMIDATE 2 MG/ML IV SOLN
INTRAVENOUS | Status: AC
  Filled 2018-01-02: qty 10

## 2018-01-02 MED ORDER — METOPROLOL TARTRATE 5 MG/5ML IV SOLN
2.5000 mg | INTRAVENOUS | Status: DC | PRN
  Filled 2018-01-02: qty 5

## 2018-01-02 MED ORDER — ORAL CARE MOUTH RINSE
15.0000 mL | Freq: Four times a day (QID) | OROMUCOSAL | Status: DC
  Administered 2018-01-03 – 2018-01-05 (×9): 15 mL via OROMUCOSAL

## 2018-01-02 MED ORDER — OSMOLITE 1.2 CAL PO LIQD
1000.0000 mL | ORAL | Status: DC
  Administered 2018-01-02 – 2018-01-05 (×3): 1000 mL
  Filled 2018-01-02: qty 1000

## 2018-01-02 MED ORDER — PANTOPRAZOLE SODIUM 40 MG IV SOLR
40.0000 mg | Freq: Every day | INTRAVENOUS | Status: DC
  Administered 2018-01-03 – 2018-01-07 (×5): 40 mg via INTRAVENOUS
  Filled 2018-01-02 (×5): qty 40

## 2018-01-02 MED ORDER — SODIUM CHLORIDE 0.9 % IV SOLN: INTRAVENOUS | Status: DC | PRN

## 2018-01-02 MED ORDER — FENTANYL CITRATE (PF) 100 MCG/2ML IJ SOLN
50.0000 ug | INTRAMUSCULAR | Status: DC | PRN
  Administered 2018-01-02 (×2): 50 ug via INTRAVENOUS
  Filled 2018-01-02 (×2): qty 2

## 2018-01-02 MED ORDER — PHENYLEPHRINE HCL-NACL 10-0.9 MG/250ML-% IV SOLN
0.0000 ug/min | INTRAVENOUS | Status: DC
Start: 2018-01-02 — End: 2018-01-05
  Administered 2018-01-02: 20 ug/min via INTRAVENOUS
  Administered 2018-01-03 (×2): 80 ug/min via INTRAVENOUS
  Filled 2018-01-02 (×2): qty 250

## 2018-01-02 MED ORDER — FENTANYL CITRATE (PF) 100 MCG/2ML IJ SOLN
50.0000 ug | INTRAMUSCULAR | Status: DC | PRN
  Filled 2018-01-02: qty 2

## 2018-01-02 NOTE — Progress Notes (Signed)
[place] Progress Note Patient Name: [name]. DOB: February 01, 1933 MRN: [id number]   Date of Service  01/02/2018  HPI/Events of Note  Call from bedside and charge nurses reporting concern regarding ongoing hypotension in a patient who was recently intubated for AMS/HCRF.  Had been hypertensive earlier with SBP in the 170s along with some ongoing tachycardia to the 140s.  PRN lopressor given earlier at 1800 but no additional PRNs given.  Due to agitation within a 18 minute period of time 100 mcg fentayl and then 2 mg of versed given to 84 year old patient.  Drop in BP to the 66Y systolic.  Nurse contacted [facility].  At that time I requested that we wait and see if BP would increase.  Bedside nurse contacted [facility] and reported that BP remained low after 20 minutes.  Order given to administer 500 cc bolus.  Called back by charge nurse reporting that per rapid response protocol 500 cc already administered with continued low BP.    eICU Interventions  Assessment: Hypotension temporally associated with administration of pain/anxiolytic medications. Additional 500 cc bolus NS D/C versed Continue PRN fentanyl Reviewed plan with charge nurse.     Intervention Category Intermediate Interventions: Hypotension - evaluation and management  Sreenidhi Ganson 01/02/2018, 9:53 PM
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
## 2018-01-02 NOTE — Progress Notes (Signed)
3 Days Post-Op   Subjective/Chief Complaint: Still confused.  Had an episode of hypotension last night.  This rebounded quickly with fluids and stat labs were OK.  Now hypertensive.  Had a BM.     Objective: Vital signs in last 24 hours: Temp:  [97.9 F (36.6 C)-100 F (37.8 C)] 98.8 F (37.1 C) (03/08 0800) Pulse Rate:  [77-142] 117 (03/08 0900) Resp:  [15-31] 20 (03/08 0943) BP: (76-184)/(24-97) 169/78 (03/08 0900) SpO2:  [91 %-100 %] 96 % (03/08 0943) Last BM Date: 01/02/18  Intake/Output from previous day: 03/07 0701 - 03/08 0700 In: 1428 [I.V.:1000; NG/GT:40; IV Piggyback:100] Out: 905 [Urine:805; Emesis/NG output:100] Intake/Output this shift: Total I/O In: -  Out: 100 [Urine:100]  General appearance:  Mumbling.  Some words are intelligible, but others are not.  Answers some questions appropriately.   Resp: breathing comfortably, but a bit tachypneic.   Cardio: regular rhythm, tachycardic with some PVCs.   GI: soft, non distended.  J tube in place with feeds going.  Incision without erythema.   Extremities: extremities normal, atraumatic, no cyanosis or edema  Lab Results:  Recent Labs    01/01/18 2102 01/02/18 0333  WBC 5.2 5.8  HGB 10.5* 10.8*  HCT 33.4* 34.3*  PLT 173 168   BMET Recent Labs    01/01/18 2102 01/02/18 0333  NA 141 143  K 4.1 3.9  CL 110 113*  CO2 23 23  GLUCOSE 116* 131*  BUN 20 20  CREATININE 0.99 0.87  CALCIUM 8.6* 8.4*   PT/INR No results for input(s): LABPROT, INR in the last 72 hours. ABG No results for input(s): PHART, HCO3 in the last 72 hours.  Invalid input(s): PCO2, PO2  Studies/Results: No results found.  Anti-infectives: Anti-infectives (From admission, onward)   Start     Dose/Rate Route Frequency Ordered Stop   12/30/17 1714  ceFAZolin (ANCEF) 2-4 GM/100ML-% IVPB    Comments:  Otelia Sergeant   : cabinet override      12/30/17 1714 12/31/17 0529   12/30/17 1600  ceFAZolin (ANCEF) IVPB 2g/100 mL premix     2 g 200 mL/hr over 30 Minutes Intravenous Every 8 hours 12/30/17 1356 12/30/17 1747   12/30/17 0545  ceFAZolin (ANCEF) 2-4 GM/100ML-% IVPB    Comments:  Waldron Session   : cabinet override      12/30/17 0545 12/30/17 0756   12/30/17 0542  ceFAZolin (ANCEF) IVPB 2g/100 mL premix     2 g 200 mL/hr over 30 Minutes Intravenous On call to O.R. 12/30/17 1610 12/30/17 9604      Assessment/Plan: s/p Procedure(s) with comments: DIAGNOSTIC LAPAROSCOPY WITH OPEN DISTAL GASTRECTOMY AND PLACEMENT JEJUNOSTOMY FEEDING TUBE (N/A) - EPIDURAL D./c NGT given low output, bowel function, and delirium.  Hopefully this will help his mental status. Hold lasix as he is still negative and had an episode of hypotension last night.   Epidural for pain control.  Might be removed in next 24-48 hours.   D/c foley Condom catheter.    Low dose haldol for icu delirium.  Restraints to keep him from pulling out/off medically necessary devices and getting out of bed (fall risk given connections) OOB to chair today if reoriented.   Restart tube feeds and advance to goal.    Giving IV metoprolol while holding coreg for HTN , CAD, and mildly depressed systolic function. Gastric cancer- await path Mantle cell lymphoma- NED Chronic anemia from chemotherapy and chronic blood loss anemia + acute blood loss anemia -  stable Mild stress induced hyperglycemia - does not need treatment at this point. PMR - no treatment at this time. GERD - on PPI IV Essential HTN- on metoprolol, prn hydralazine.  Got 1 dose lasix IV.    ICU delirium.  D/c benadryl and robaxin.   PCCM consult for delirium. Head CT CXR for tachypnea.       LOS: 3 days    Stark Klein 01/02/2018

## 2018-01-02 NOTE — Progress Notes (Signed)
Pt unable to void urine. Bladder scan revealed volume of 640 cc. MD paged. Awaiting for return call.

## 2018-01-02 NOTE — Anesthesia Procedure Notes (Signed)
Procedure Name: Intubation Performed by: Gean Maidens, CRNA Pre-anesthesia Checklist: Patient identified, Emergency Drugs available, Suction available, Patient being monitored and Timeout performed Patient Re-evaluated:Patient Re-evaluated prior to induction Oxygen Delivery Method: Circle system utilized Preoxygenation: Pre-oxygenation with 100% oxygen Induction Type: IV induction Ventilation: Mask ventilation without difficulty Laryngoscope Size: Mac and 4 Grade View: Grade I Tube type: Subglottic suction tube Tube size: 7.5 mm Number of attempts: 1 Airway Equipment and Method: Stylet Placement Confirmation: ETT inserted through vocal cords under direct vision,  CO2 detector and breath sounds checked- equal and bilateral Secured at: 23 cm Tube secured with: Tape Dental Injury: Teeth and Oropharynx as per pre-operative assessment

## 2018-01-02 NOTE — Progress Notes (Signed)
[place] Progress Note Patient Name: [name]. DOB: 1933-05-07 MRN: [id number]   Date of Service  01/02/2018  HPI/Events of Note  Call from bedside nurse reporting abrupt drop in sats to the 15s with AMS.  Dried secretions in post pharynx.  Placed on NRB with increase in sats.  ABG pH 7.29/53/242/24.  eICU Interventions  Intubated for airway protection and increased WOB.  Placed on PRN sedation. F/U on PCXR and post-intubation ABG     Intervention Category Major Interventions: Respiratory failure - evaluation and management  DETERDING,ELIZABETH 01/02/2018, 9:18 PM

## 2018-01-02 NOTE — Procedures (Signed)
Arterial Catheter Insertion Procedure Note [name] [id number] December 11, 1932  Procedure: Insertion of Arterial Catheter  Indications: Blood pressure monitoring  Procedure Details Consent: Unable to obtain consent because of altered level of consciousness. Time Out: Verified patient identification, verified procedure, site/side was marked, verified correct patient position, special equipment/implants available, medications/allergies/relevent history reviewed, required imaging and test results available.  Performed  Maximum sterile technique was used including antiseptics, cap, gloves, gown, hand hygiene, mask and sheet. Skin prep: Chlorhexidine; local anesthetic administered 20 gauge catheter was inserted into left radial artery using the Seldinger technique.  Evaluation Blood flow good; BP tracing good. Complications: No apparent complications.   Rolm Baptise 01/02/2018

## 2018-01-03 ENCOUNTER — Inpatient Hospital Stay (HOSPITAL_COMMUNITY): Payer: Medicare Other

## 2018-01-03 DIAGNOSIS — J9600 Acute respiratory failure, unspecified whether with hypoxia or hypercapnia: Secondary | ICD-10-CM

## 2018-01-03 DIAGNOSIS — C169 Malignant neoplasm of stomach, unspecified: Secondary | ICD-10-CM

## 2018-01-03 LAB — BASIC METABOLIC PANEL
Anion gap: 3 — ABNORMAL LOW (ref 5–15)
Anion gap: 5 (ref 5–15)
BUN: 18 mg/dL (ref 6–20)
BUN: 22 mg/dL — AB (ref 6–20)
CO2: 21 mmol/L — ABNORMAL LOW (ref 22–32)
CO2: 22 mmol/L (ref 22–32)
CREATININE: 1.21 mg/dL (ref 0.61–1.24)
Calcium: 7.9 mg/dL — ABNORMAL LOW (ref 8.9–10.3)
Calcium: 8 mg/dL — ABNORMAL LOW (ref 8.9–10.3)
Chloride: 118 mmol/L — ABNORMAL HIGH (ref 101–111)
Chloride: 122 mmol/L — ABNORMAL HIGH (ref 101–111)
Creatinine, Ser: 1.38 mg/dL — ABNORMAL HIGH (ref 0.61–1.24)
GFR calc Af Amer: 53 mL/min — ABNORMAL LOW (ref >60)
GFR calc Af Amer: >60 mL/min (ref >60)
GFR, EST NON AFRICAN AMERICAN: 46 mL/min — AB (ref >60)
GFR, EST NON AFRICAN AMERICAN: 54 mL/min — AB (ref >60)
Glucose, Bld: 104 mg/dL — ABNORMAL HIGH (ref 65–99)
Glucose, Bld: 90 mg/dL (ref 65–99)
POTASSIUM: 3.8 mmol/L (ref 3.5–5.1)
POTASSIUM: 4.5 mmol/L (ref 3.5–5.1)
SODIUM: 144 mmol/L (ref 135–145)
SODIUM: 147 mmol/L — AB (ref 135–145)

## 2018-01-03 LAB — GLUCOSE, CAPILLARY
GLUCOSE-CAPILLARY: 63 mg/dL — AB (ref 65–99)
GLUCOSE-CAPILLARY: 94 mg/dL (ref 65–99)
Glucose-Capillary: 70 mg/dL (ref 65–99)
Glucose-Capillary: 61 mg/dL — ABNORMAL LOW (ref 65–99)
Glucose-Capillary: 115 mg/dL — ABNORMAL HIGH (ref 65–99)
Glucose-Capillary: 88 mg/dL (ref 65–99)
Glucose-Capillary: 88 mg/dL (ref 65–99)
Glucose-Capillary: 84 mg/dL (ref 65–99)

## 2018-01-03 LAB — CBC
HCT: 30.6 % — ABNORMAL LOW (ref 39.0–52.0)
HCT: 34.6 % — ABNORMAL LOW (ref 39.0–52.0)
Hemoglobin: 10.7 g/dL — ABNORMAL LOW (ref 13.0–17.0)
Hemoglobin: 9.6 g/dL — ABNORMAL LOW (ref 13.0–17.0)
MCH: 29.4 pg (ref 26.0–34.0)
MCH: 30.2 pg (ref 26.0–34.0)
MCHC: 30.9 g/dL (ref 30.0–36.0)
MCHC: 31.4 g/dL (ref 30.0–36.0)
MCV: 95.1 fL (ref 78.0–100.0)
MCV: 96.2 fL (ref 78.0–100.0)
PLATELETS: 156 10*3/uL (ref 150–400)
PLATELETS: 179 10*3/uL (ref 150–400)
RBC: 3.18 MIL/uL — ABNORMAL LOW (ref 4.22–5.81)
RBC: 3.64 MIL/uL — AB (ref 4.22–5.81)
RDW: 16.8 % — ABNORMAL HIGH (ref 11.5–15.5)
RDW: 16.9 % — ABNORMAL HIGH (ref 11.5–15.5)
WBC: 4.6 10*3/uL (ref 4.0–10.5)
WBC: 6.3 10*3/uL (ref 4.0–10.5)

## 2018-01-03 LAB — POCT I-STAT, CHEM 8
BUN: 16 mg/dL (ref 6–20)
CHLORIDE: 111 mmol/L (ref 101–111)
Calcium, Ion: 1.17 mmol/L (ref 1.15–1.40)
Creatinine, Ser: 1.1 mg/dL (ref 0.61–1.24)
Glucose, Bld: 100 mg/dL — ABNORMAL HIGH (ref 65–99)
HCT: 29 % — ABNORMAL LOW (ref 39.0–52.0)
Hemoglobin: 9.9 g/dL — ABNORMAL LOW (ref 13.0–17.0)
POTASSIUM: 4.1 mmol/L (ref 3.5–5.1)
SODIUM: 144 mmol/L (ref 135–145)
TCO2: 19 mmol/L — ABNORMAL LOW (ref 22–32)

## 2018-01-03 LAB — LACTIC ACID, PLASMA
LACTIC ACID, VENOUS: 1.6 mmol/L (ref 0.5–1.9)
Lactic Acid, Venous: 2.1 mmol/L (ref 0.5–1.9)

## 2018-01-03 LAB — TROPONIN I
TROPONIN I: 1.85 ng/mL — AB (ref <0.03)
Troponin I: 1.64 ng/mL (ref <0.03)
Troponin I: 1.16 ng/mL (ref <0.03)

## 2018-01-03 LAB — MAGNESIUM: Magnesium: 2 mg/dL (ref 1.7–2.4)

## 2018-01-03 LAB — PROCALCITONIN: PROCALCITONIN: 6.68 ng/mL

## 2018-01-03 LAB — PHOSPHORUS: PHOSPHORUS: 3.3 mg/dL (ref 2.5–4.6)

## 2018-01-03 MED ORDER — DEXTROSE 50 % IV SOLN
INTRAVENOUS | Status: AC
  Administered 2018-01-03: 50 mL
  Filled 2018-01-03: qty 50

## 2018-01-03 MED ORDER — MIDAZOLAM HCL 2 MG/2ML IJ SOLN
1.0000 mg | INTRAMUSCULAR | Status: DC | PRN
  Filled 2018-01-03: qty 2

## 2018-01-03 MED ORDER — SODIUM CHLORIDE 0.9 % IV BOLUS (SEPSIS)
1000.0000 mL | Freq: Once | INTRAVENOUS | Status: AC
  Administered 2018-01-03: 1000 mL via INTRAVENOUS

## 2018-01-03 MED ORDER — MIDAZOLAM HCL 2 MG/2ML IJ SOLN
INTRAMUSCULAR | Status: AC
  Administered 2018-01-03: 02:00:00
  Filled 2018-01-03: qty 2

## 2018-01-03 MED ORDER — PIPERACILLIN-TAZOBACTAM 3.375 G IVPB
3.3750 g | Freq: Three times a day (TID) | INTRAVENOUS | Status: DC
  Administered 2018-01-03 – 2018-01-11 (×25): 3.375 g via INTRAVENOUS
  Filled 2018-01-03 (×23): qty 50

## 2018-01-03 MED ORDER — NOREPINEPHRINE BITARTRATE 1 MG/ML IV SOLN
0.0000 ug/min | INTRAVENOUS | Status: DC
  Administered 2018-01-03: 2 ug/min via INTRAVENOUS
  Filled 2018-01-03 (×2): qty 4

## 2018-01-03 MED ORDER — SODIUM CHLORIDE 0.9 % IV SOLN
25.0000 ug/h | INTRAVENOUS | Status: DC
  Administered 2018-01-03: 50 ug/h via INTRAVENOUS
  Filled 2018-01-03: qty 50

## 2018-01-03 MED ORDER — ETOMIDATE 2 MG/ML IV SOLN
0.3000 mg/kg | Freq: Once | INTRAVENOUS | Status: AC
  Administered 2018-01-03: 20 mg via INTRAVENOUS

## 2018-01-03 MED ORDER — FENTANYL BOLUS VIA INFUSION
25.0000 ug | INTRAVENOUS | Status: DC | PRN
  Administered 2018-01-03 (×2): 25 ug via INTRAVENOUS
  Filled 2018-01-03: qty 25

## 2018-01-03 MED ORDER — FENTANYL CITRATE (PF) 100 MCG/2ML IJ SOLN
50.0000 ug | Freq: Once | INTRAMUSCULAR | Status: AC
  Administered 2018-01-03: 50 ug via INTRAVENOUS

## 2018-01-03 NOTE — Progress Notes (Signed)
Pharmacy Antibiotic Note  [name]. is a 84 y.o. male admitted on 12/30/2017 with aspiration PNA.  Pharmacy has been consulted for Zosyn dosing.  Plan: Zosyn 3.375g IV q8h (4 hour infusion).  Height: 6' (182.9 cm) Weight: 183 lb 6.8 oz (83.2 kg) IBW/kg (Calculated) : 77.6  Temp (24hrs), Avg:98.8 F (37.1 C), Min:97.1 F (36.2 C), Max:100 F (37.8 C)  Recent Labs  Lab 12/31/17 0321 01/01/18 0302 01/01/18 2102 01/02/18 0333 01/02/18 1126 01/03/18 0010 01/03/18 0030  WBC 7.6 6.2 5.2 5.8  --  6.3  --   CREATININE 1.04 0.92 0.99 0.87 0.79 1.21 1.10  LATICACIDVEN  --   --   --   --  0.9 2.1*  --     Estimated Creatinine Clearance: 56.8 mL/min (by C-G formula based on SCr of 1.1 mg/dL).    Allergies  Allergen Reactions  . Niacin Hives    Antimicrobials this admission: 3/9 Zosyn >>  3/5 cefazolin pre and post-op  Dose adjustments this admission: ---  Microbiology results: 3/9 BCx: sent  3/9 Sputum: sent    Thank you for allowing pharmacy to be a part of this patient's care.  Royetta Asal, PharmD, BCPS Pager [phone] 01/03/2018 2:02 AM
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
## 2018-01-03 NOTE — Anesthesia Pain Management Evaluation Note (Signed)
  Anesthesia Epidural Rounding Note  Pt seen and examined with wife at bedside. Pt acutely delirious. Moving all 4 extremities spontaneously. Discussed with wife that in his current state it is impossible to fully determine if the epidural is providing adequate analgesia, but that it is certainly not contributing his delirium. Epidural site is CDI. Discussed with ccm nurse plan to continue epidural and hope Mr. Hannen' neurological status improves.  Plan: continue epidural   Nolon Nations 01/03/2018

## 2018-01-03 NOTE — Procedures (Signed)
Central Venous Catheter Insertion Procedure Note [name] [id number] 1932/11/08  Procedure: Insertion of Central Venous Catheter Indications: Assessment of intravascular volume, Drug and/or fluid administration and Frequent blood sampling  Procedure Details Consent: Unable to obtain consent because of emergent medical necessity. Time Out: Verified patient identification, verified procedure, site/side was marked, verified correct patient position, special equipment/implants available, medications/allergies/relevent history reviewed, required imaging and test results available.  Performed  Maximum sterile technique was used including antiseptics, cap, gloves, gown, hand hygiene, mask and sheet. Skin prep: Chlorhexidine; local anesthetic administered A antimicrobial bonded/coated triple lumen catheter was placed in the left internal jugular vein using the Seldinger technique.  Evaluation Blood flow good Complications: No apparent complications Patient did tolerate procedure well. Chest X-ray ordered to verify placement.  CXR: pending.  Hayden Pedro, AGACNP-BC [facility]  Pgr: [phone]  PCCM Pgr: [phone]

## 2018-01-03 NOTE — Progress Notes (Signed)
CRITICAL VALUE ALERT  Critical Value:  Trop 1.85  Date & Time Notied:  01/03/18 1228  Provider Notified: Mannam  Orders Received/Actions taken: MD paged. Awaiting return call.

## 2018-01-03 NOTE — Progress Notes (Signed)
During procedure of central line placement. 49mcg of Versed given per Doreatha Massed, NP verbal orders.  Given at 0150. Verified by charge nurse Jennye Moccasin.

## 2018-01-03 NOTE — Progress Notes (Signed)
Epidural d/c'd  Tip intact; site clean and free of inflamation

## 2018-01-03 NOTE — Procedures (Signed)
Bronchoscopy Procedure Note [name] [id number] 1933-07-02  Procedure: Bronchoscopy Indications: Diagnostic evaluation of the airways and Obtain specimens for culture and/or other diagnostic studies  Procedure Details Consent: Risks of procedure as well as the alternatives and risks of each were explained to the (patient/caregiver).  Consent for procedure obtained. Time Out: Verified patient identification, verified procedure, site/side was marked, verified correct patient position, special equipment/implants available, medications/allergies/relevent history reviewed, required imaging and test results available.  Performed  In preparation for procedure, patient was given 100% FiO2 and bronchoscope lubricated. Sedation: Benzodiazepines and Etomidate  Airway entered and the following bronchi were examined: RUL, RML, RLL, LUL, LLL and Bronchi.  Mucus plugs noted in RLL which were suctioned out Procedures performed: BAL in right lower lobe. Purulent secretions obtained  Evaluation Hemodynamic Status: BP stable throughout; O2 sats: stable throughout Patient's Current Condition: stable Specimens:  Sent purulent fluid Complications: No apparent complications Patient did tolerate procedure well.   [name] 01/03/2018

## 2018-01-03 NOTE — Progress Notes (Signed)
PCCM Interval Note   Patient with new RLL Infiltrate and concern for aspiration. LA 2.1. Given additional 1L NS and started on Zosyn. PAN culture. PCT pending. Trend CVP   Hayden Pedro, AGACNP-BC [facility]  Pgr: [phone]  PCCM Pgr: [phone]

## 2018-01-03 NOTE — Addendum Note (Signed)
Addendum  created 01/03/18 1443 by Lyndle Herrlich, MD   Sign clinical note

## 2018-01-03 NOTE — Progress Notes (Signed)
4 Days Post-Op   Subjective/Chief Complaint: Events of yesterday noted. Intubated and sedated. New RLL infiltrate   Objective: Vital signs in last 24 hours: Temp:  [97.1 F (36.2 C)-99.7 F (37.6 C)] 99.7 F (37.6 C) (03/09 0400) Pulse Rate:  [61-141] 107 (03/09 0000) Resp:  [18-20] 18 (03/08 2100) BP: (129-194)/(69-99) 129/99 (03/08 2100) SpO2:  [56 %-100 %] 97 % (03/09 0325) FiO2 (%):  [60 %-70 %] 60 % (03/09 0325) Last BM Date: 01/02/18  Intake/Output from previous day: 03/08 0701 - 03/09 0700 In: 2260.3 [I.V.:1250.8; NG/GT:805.5] Out: 1410 [Urine:1410] Intake/Output this shift: No intake/output data recorded.  General appearance: intubated and sedated Resp: rhonchi bilaterally Cardio: regular rate and rhythm and tachy GI: soft, nontender  Lab Results:  Recent Labs    01/03/18 0010 01/03/18 0030 01/03/18 0710  WBC 6.3  --  4.6  HGB 9.6* 9.9* 10.7*  HCT 30.6* 29.0* 34.6*  PLT 156  --  179   BMET Recent Labs    01/02/18 1126 01/03/18 0010 01/03/18 0030  NA 140 147* 144  K 4.7 4.5 4.1  CL 111 122* 111  CO2 20* 22  --   GLUCOSE 114* 104* 100*  BUN 16 18 16   CREATININE 0.79 1.21 1.10  CALCIUM 8.3* 7.9*  --    PT/INR No results for input(s): LABPROT, INR in the last 72 hours. ABG Recent Labs    01/02/18 2012 01/02/18 2204  PHART 7.285* 7.440  HCO3 24.4 19.4*    Studies/Results: Ct Head Wo Contrast  Result Date: 01/02/2018 CLINICAL DATA:  Altered level of consciousness and confusion. EXAM: CT HEAD WITHOUT CONTRAST TECHNIQUE: Contiguous axial images were obtained from the base of the skull through the vertex without intravenous contrast. COMPARISON:  09/10/2017 FINDINGS: Brain: Generalized atrophy. Chronic small-vessel ischemic changes throughout the white matter. No sign of acute infarction, mass lesion, hemorrhage, hydrocephalus or extra-axial collection. Vascular: No abnormal vascular finding. Skull: Normal Sinuses/Orbits: Clear/normal Other: None  IMPRESSION: No acute finding. Chronic atrophy and small-vessel ischemic changes of the brain. Electronically Signed   By: Nelson Chimes M.D.   On: 01/02/2018 10:59   Dg Chest Port 1 View  Result Date: 01/03/2018 CLINICAL DATA:  Central line placement. EXAM: PORTABLE CHEST 1 VIEW COMPARISON:  Chest radiograph performed 01/02/2018 FINDINGS: The patient's endotracheal tube is seen ending 4-5 cm above the carina. The balloon of the endotracheal tube may be slightly overinflated. The left IJ line is noted ending about the proximal SVC. An enteric tube is noted extending below the diaphragm. There is right-sided volume loss, with apparent rightward shift of the mediastinum. A small right pleural effusion is suspected. The left lung appears clear. No pneumothorax is seen. The cardiomediastinal silhouette is borderline normal in size. External pacing pads are seen. No acute osseous abnormalities are identified. IMPRESSION: 1. Left IJ line noted ending about the proximal SVC. 2. Endotracheal tube seen ending 4-5 cm above the carina. The balloon of the endotracheal tube may be slightly overinflated. 3. Right-sided volume loss, with apparent rightward shift of the mediastinum. Small right pleural effusion noted. Electronically Signed   By: Garald Balding M.D.   On: 01/03/2018 03:20   Portable Chest X-ray  Result Date: 01/02/2018 CLINICAL DATA:  84 y/o  M; intubation. EXAM: PORTABLE CHEST 1 VIEW COMPARISON:  01/02/2018 chest radiograph. FINDINGS: Patient is rotated. Stable cardiac silhouette given projection and technique. Transcutaneous pacing pads. Stable reticular opacities. Right lung base linear opacity, likely atelectasis. Improved aeration of left lung base.  Bones are unremarkable. Endotracheal tube tip projects 5.9 cm above carina. Enteric tube tip is in the proximal stomach. IMPRESSION: Endotracheal tube tip projects 5.9 cm above carina. Enteric tube tip is in proximal stomach. Persistent reticular opacities,  probably edema. Improved aeration of left lung base and new platelike atelectasis at right lung base. Electronically Signed   By: Kristine Garbe M.D.   On: 01/02/2018 21:14   Dg Chest Port 1 View  Result Date: 01/02/2018 CLINICAL DATA:  84 y/o  M; respiratory distress. EXAM: PORTABLE CHEST 1 VIEW COMPARISON:  01/02/2018 chest radiograph. FINDINGS: Stable cardiomegaly given projection and technique. Diffuse reticular opacities of the lungs. Persistent left lower lobe opacity. No pneumothorax. Bones are unremarkable. IMPRESSION: Increased reticular opacities of the lungs probably representing interstitial pulmonary edema. Persistent left basilar opacity which may represent effusion and atelectasis, less likely pneumonia. Electronically Signed   By: Kristine Garbe M.D.   On: 01/02/2018 20:26   Dg Chest Port 1 View  Result Date: 01/02/2018 CLINICAL DATA:  Confusion altered mental status EXAM: PORTABLE CHEST 1 VIEW COMPARISON:  09/10/2017 FINDINGS: Heart size mildly enlarged. Negative for heart failure. Mild left lower lobe atelectasis/infiltrate. No effusion. Epidural anesthetic catheter overlies the right chest. IMPRESSION: Mild left lower lobe atelectasis/infiltrate. Cardiac enlargement without heart failure. Electronically Signed   By: Franchot Gallo M.D.   On: 01/02/2018 11:33    Anti-infectives: Anti-infectives (From admission, onward)   Start     Dose/Rate Route Frequency Ordered Stop   01/03/18 0200  piperacillin-tazobactam (ZOSYN) IVPB 3.375 g     3.375 g 12.5 mL/hr over 240 Minutes Intravenous Every 8 hours 01/03/18 0112     12/30/17 1714  ceFAZolin (ANCEF) 2-4 GM/100ML-% IVPB    Comments:  Otelia Sergeant   : cabinet override      12/30/17 1714 12/31/17 0529   12/30/17 1600  ceFAZolin (ANCEF) IVPB 2g/100 mL premix     2 g 200 mL/hr over 30 Minutes Intravenous Every 8 hours 12/30/17 1356 12/30/17 1747   12/30/17 0545  ceFAZolin (ANCEF) 2-4 GM/100ML-% IVPB    Comments:   Waldron Session   : cabinet override      12/30/17 0545 12/30/17 0756   12/30/17 0542  ceFAZolin (ANCEF) IVPB 2g/100 mL premix     2 g 200 mL/hr over 30 Minutes Intravenous On call to O.R. 12/30/17 1638 12/30/17 [id number]      Assessment/Plan: s/p Procedure(s) with comments: DIAGNOSTIC LAPAROSCOPY WITH OPEN DISTAL GASTRECTOMY AND PLACEMENT JEJUNOSTOMY FEEDING TUBE (N/A) - EPIDURAL Hold tube feeds for now given possible aspiration  Continue IV zosyn VDRF per CCM. Appreciate their assistance Abdomen seems benign. Will monitor closely  LOS: 4 days    TOTH III,Jossette Zirbel S 01/03/2018

## 2018-01-04 ENCOUNTER — Other Ambulatory Visit: Payer: Self-pay

## 2018-01-04 ENCOUNTER — Encounter (HOSPITAL_COMMUNITY): Payer: Self-pay

## 2018-01-04 DIAGNOSIS — I248 Other forms of acute ischemic heart disease: Secondary | ICD-10-CM

## 2018-01-04 LAB — URINALYSIS, ROUTINE W REFLEX MICROSCOPIC
BILIRUBIN URINE: NEGATIVE
Glucose, UA: NEGATIVE mg/dL
KETONES UR: NEGATIVE mg/dL
Leukocytes, UA: NEGATIVE
Nitrite: NEGATIVE
PROTEIN: 30 mg/dL — AB
Specific Gravity, Urine: 1.018 (ref 1.005–1.030)
pH: 5 (ref 5.0–8.0)

## 2018-01-04 LAB — CBC
HCT: 28.4 % — ABNORMAL LOW (ref 39.0–52.0)
HEMOGLOBIN: 8.9 g/dL — AB (ref 13.0–17.0)
MCH: 30.1 pg (ref 26.0–34.0)
MCHC: 31.3 g/dL (ref 30.0–36.0)
MCV: 95.9 fL (ref 78.0–100.0)
Platelets: 126 10*3/uL — ABNORMAL LOW (ref 150–400)
RBC: 2.96 MIL/uL — ABNORMAL LOW (ref 4.22–5.81)
RDW: 17.4 % — ABNORMAL HIGH (ref 11.5–15.5)
WBC: 4.8 10*3/uL (ref 4.0–10.5)

## 2018-01-04 LAB — GLUCOSE, CAPILLARY
GLUCOSE-CAPILLARY: 109 mg/dL — AB (ref 65–99)
GLUCOSE-CAPILLARY: 111 mg/dL — AB (ref 65–99)
GLUCOSE-CAPILLARY: 117 mg/dL — AB (ref 65–99)
GLUCOSE-CAPILLARY: 134 mg/dL — AB (ref 65–99)
Glucose-Capillary: 111 mg/dL — ABNORMAL HIGH (ref 65–99)
Glucose-Capillary: 112 mg/dL — ABNORMAL HIGH (ref 65–99)
Glucose-Capillary: 97 mg/dL (ref 65–99)

## 2018-01-04 LAB — BASIC METABOLIC PANEL
Anion gap: 5 (ref 5–15)
BUN: 24 mg/dL — AB (ref 6–20)
CHLORIDE: 120 mmol/L — AB (ref 101–111)
CO2: 20 mmol/L — ABNORMAL LOW (ref 22–32)
Calcium: 7.7 mg/dL — ABNORMAL LOW (ref 8.9–10.3)
Creatinine, Ser: 1.37 mg/dL — ABNORMAL HIGH (ref 0.61–1.24)
GFR calc Af Amer: 54 mL/min — ABNORMAL LOW (ref >60)
GFR calc non Af Amer: 46 mL/min — ABNORMAL LOW (ref >60)
GLUCOSE: 127 mg/dL — AB (ref 65–99)
POTASSIUM: 3.8 mmol/L (ref 3.5–5.1)
Sodium: 145 mmol/L (ref 135–145)

## 2018-01-04 LAB — PNEUMOCYSTIS JIROVECI SMEAR BY DFA: Pneumocystis jiroveci Ag: NEGATIVE

## 2018-01-04 LAB — PROCALCITONIN: Procalcitonin: 9.81 ng/mL

## 2018-01-04 LAB — BLOOD GAS, ARTERIAL
Acid-base deficit: 3.9 mmol/L — ABNORMAL HIGH (ref 0.0–2.0)
BICARBONATE: 19.6 mmol/L — AB (ref 20.0–28.0)
Drawn by: 317771
FIO2: 40
MODE: POSITIVE
O2 Saturation: 98.6 %
PEEP: 5 cmH2O
Patient temperature: 37
Pressure support: 5 cmH2O
pCO2 arterial: 31.8 mmHg — ABNORMAL LOW (ref 32.0–48.0)
pH, Arterial: 7.407 (ref 7.350–7.450)
pO2, Arterial: 128 mmHg — ABNORMAL HIGH (ref 83.0–108.0)

## 2018-01-04 MED ORDER — SODIUM CHLORIDE 0.9% FLUSH
10.0000 mL | Freq: Two times a day (BID) | INTRAVENOUS | Status: DC
  Administered 2018-01-04 – 2018-01-05 (×3): 10 mL
  Administered 2018-01-06: 20 mL
  Administered 2018-01-07: 10 mL

## 2018-01-04 MED ORDER — SODIUM CHLORIDE 0.9% FLUSH: 10.0000 mL | INTRAVENOUS | Status: DC | PRN

## 2018-01-04 MED ORDER — LIP MEDEX EX OINT
TOPICAL_OINTMENT | CUTANEOUS | Status: AC
  Administered 2018-01-04: 1
  Filled 2018-01-04: qty 7

## 2018-01-04 MED ORDER — CHLORHEXIDINE GLUCONATE CLOTH 2 % EX PADS
6.0000 | MEDICATED_PAD | Freq: Every day | CUTANEOUS | Status: DC
  Administered 2018-01-04 – 2018-01-22 (×15): 6 via TOPICAL

## 2018-01-04 MED ORDER — ENOXAPARIN SODIUM 40 MG/0.4ML ~~LOC~~ SOLN
40.0000 mg | SUBCUTANEOUS | Status: DC
Start: 2018-01-04 — End: 2018-01-09
  Administered 2018-01-04 – 2018-01-09 (×6): 40 mg via SUBCUTANEOUS
  Filled 2018-01-04 (×6): qty 0.4

## 2018-01-04 MED ORDER — ASPIRIN EC 81 MG PO TBEC
81.0000 mg | DELAYED_RELEASE_TABLET | Freq: Every day | ORAL | Status: DC
  Administered 2018-01-04 – 2018-01-12 (×9): 81 mg via ORAL
  Filled 2018-01-04 (×9): qty 1

## 2018-01-04 MED ORDER — VANCOMYCIN HCL 10 G IV SOLR
1500.0000 mg | Freq: Once | INTRAVENOUS | Status: AC
  Administered 2018-01-04: 1500 mg via INTRAVENOUS
  Filled 2018-01-04: qty 1500

## 2018-01-04 MED ORDER — CARVEDILOL 3.125 MG PO TABS
3.1250 mg | ORAL_TABLET | Freq: Two times a day (BID) | ORAL | Status: DC
  Administered 2018-01-04 – 2018-01-05 (×2): 3.125 mg
  Filled 2018-01-04 (×2): qty 1

## 2018-01-04 MED ORDER — VANCOMYCIN HCL IN DEXTROSE 1-5 GM/200ML-% IV SOLN
1000.0000 mg | INTRAVENOUS | Status: DC
  Administered 2018-01-05: 1000 mg via INTRAVENOUS
  Filled 2018-01-04: qty 200
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
## 2018-01-04 NOTE — Progress Notes (Signed)
5 Days Post-Op   Subjective/Chief Complaint: Intubated and sedated. Weaning on vent. Off pressors now.    Objective: Vital signs in last 24 hours: Temp:  [98 F (36.7 C)-102.1 F (38.9 C)] 98.6 F (37 C) (03/10 0351) Pulse Rate:  [81-127] 97 (03/10 0700) Resp:  [15-24] 15 (03/10 0700) BP: (68-180)/(45-77) 150/48 (03/10 0700) SpO2:  [99 %-100 %] 100 % (03/10 0700) FiO2 (%):  [40 %-50 %] 40 % (03/10 0820) Weight:  [79.7 kg (175 lb 11.3 oz)] 79.7 kg (175 lb 11.3 oz) (03/09 1115) Last BM Date: 01/03/18  Intake/Output from previous day: 03/09 0701 - 03/10 0700 In: 3079.6 [I.V.:1817.9; NG/GT:937.3; IV Piggyback:150] Out: 1000 [Urine:1000] Intake/Output this shift: No intake/output data recorded.  General appearance: intubated and sedated Resp: rhonchi anterior - right Cardio: regular rate and rhythm GI: soft, minimal tenderness. incision looks good. tube feeds at goal  Lab Results:  Recent Labs    01/03/18 0710 01/04/18 0449  WBC 4.6 4.8  HGB 10.7* 8.9*  HCT 34.6* 28.4*  PLT 179 126*   BMET Recent Labs    01/03/18 0010 01/03/18 0030 01/03/18 0710  NA 147* 144 144  K 4.5 4.1 3.8  CL 122* 111 118*  CO2 22  --  21*  GLUCOSE 104* 100* 90  BUN 18 16 22*  CREATININE 1.21 1.10 1.38*  CALCIUM 7.9*  --  8.0*   PT/INR No results for input(s): LABPROT, INR in the last 72 hours. ABG Recent Labs    01/02/18 2012 01/02/18 2204  PHART 7.285* 7.440  HCO3 24.4 19.4*    Studies/Results: Ct Head Wo Contrast  Result Date: 01/02/2018 CLINICAL DATA:  Altered level of consciousness and confusion. EXAM: CT HEAD WITHOUT CONTRAST TECHNIQUE: Contiguous axial images were obtained from the base of the skull through the vertex without intravenous contrast. COMPARISON:  09/10/2017 FINDINGS: Brain: Generalized atrophy. Chronic small-vessel ischemic changes throughout the white matter. No sign of acute infarction, mass lesion, hemorrhage, hydrocephalus or extra-axial collection.  Vascular: No abnormal vascular finding. Skull: Normal Sinuses/Orbits: Clear/normal Other: None IMPRESSION: No acute finding. Chronic atrophy and small-vessel ischemic changes of the brain. Electronically Signed   By: Nelson Chimes M.D.   On: 01/02/2018 10:59   Dg Chest Port 1 View  Result Date: 01/03/2018 CLINICAL DATA:  S/p right lobe bronchoscopy EXAM: PORTABLE CHEST 1 VIEW COMPARISON:  Chest x-ray from earlier same day. Chest x-ray dated 01/02/2018. FINDINGS: Improved aeration at the right lung base, presumably resolving airspace collapse. Commensurate reduction of the rightward shift of the mediastinal structures. Endotracheal tube remains well positioned with tip approximately 2.5 cm above the carina. Enteric tube passes below the diaphragm. Left IJ central line is stable in position with tip at the level of the upper SVC. Heart size is stable.  No pneumothorax seen. IMPRESSION: 1. Improved aeration at the right lung base compared to the chest x-ray from earlier today, presumably resolving airspace collapse status post bronchoscopy. Commensurate reduction of the rightward shift of the mediastinal structures. 2. Support apparatus appears stable in position. Electronically Signed   By: Franki Cabot M.D.   On: 01/03/2018 12:32   Dg Chest Port 1 View  Result Date: 01/03/2018 CLINICAL DATA:  Central line placement. EXAM: PORTABLE CHEST 1 VIEW COMPARISON:  Chest radiograph performed 01/02/2018 FINDINGS: The patient's endotracheal tube is seen ending 4-5 cm above the carina. The balloon of the endotracheal tube may be slightly overinflated. The left IJ line is noted ending about the proximal SVC. An enteric  tube is noted extending below the diaphragm. There is right-sided volume loss, with apparent rightward shift of the mediastinum. A small right pleural effusion is suspected. The left lung appears clear. No pneumothorax is seen. The cardiomediastinal silhouette is borderline normal in size. External pacing  pads are seen. No acute osseous abnormalities are identified. IMPRESSION: 1. Left IJ line noted ending about the proximal SVC. 2. Endotracheal tube seen ending 4-5 cm above the carina. The balloon of the endotracheal tube may be slightly overinflated. 3. Right-sided volume loss, with apparent rightward shift of the mediastinum. Small right pleural effusion noted. Electronically Signed   By: Garald Balding M.D.   On: 01/03/2018 03:20   Portable Chest X-ray  Result Date: 01/02/2018 CLINICAL DATA:  84 y/o  M; intubation. EXAM: PORTABLE CHEST 1 VIEW COMPARISON:  01/02/2018 chest radiograph. FINDINGS: Patient is rotated. Stable cardiac silhouette given projection and technique. Transcutaneous pacing pads. Stable reticular opacities. Right lung base linear opacity, likely atelectasis. Improved aeration of left lung base. Bones are unremarkable. Endotracheal tube tip projects 5.9 cm above carina. Enteric tube tip is in the proximal stomach. IMPRESSION: Endotracheal tube tip projects 5.9 cm above carina. Enteric tube tip is in proximal stomach. Persistent reticular opacities, probably edema. Improved aeration of left lung base and new platelike atelectasis at right lung base. Electronically Signed   By: Kristine Garbe M.D.   On: 01/02/2018 21:14   Dg Chest Port 1 View  Result Date: 01/02/2018 CLINICAL DATA:  84 y/o  M; respiratory distress. EXAM: PORTABLE CHEST 1 VIEW COMPARISON:  01/02/2018 chest radiograph. FINDINGS: Stable cardiomegaly given projection and technique. Diffuse reticular opacities of the lungs. Persistent left lower lobe opacity. No pneumothorax. Bones are unremarkable. IMPRESSION: Increased reticular opacities of the lungs probably representing interstitial pulmonary edema. Persistent left basilar opacity which may represent effusion and atelectasis, less likely pneumonia. Electronically Signed   By: Kristine Garbe M.D.   On: 01/02/2018 20:26   Dg Chest Port 1 View  Result  Date: 01/02/2018 CLINICAL DATA:  Confusion altered mental status EXAM: PORTABLE CHEST 1 VIEW COMPARISON:  09/10/2017 FINDINGS: Heart size mildly enlarged. Negative for heart failure. Mild left lower lobe atelectasis/infiltrate. No effusion. Epidural anesthetic catheter overlies the right chest. IMPRESSION: Mild left lower lobe atelectasis/infiltrate. Cardiac enlargement without heart failure. Electronically Signed   By: Franchot Gallo M.D.   On: 01/02/2018 11:33    Anti-infectives: Anti-infectives (From admission, onward)   Start     Dose/Rate Route Frequency Ordered Stop   01/03/18 0200  piperacillin-tazobactam (ZOSYN) IVPB 3.375 g     3.375 g 12.5 mL/hr over 240 Minutes Intravenous Every 8 hours 01/03/18 0112     12/30/17 1714  ceFAZolin (ANCEF) 2-4 GM/100ML-% IVPB    Comments:  Otelia Sergeant   : cabinet override      12/30/17 1714 12/31/17 0529   12/30/17 1600  ceFAZolin (ANCEF) IVPB 2g/100 mL premix     2 g 200 mL/hr over 30 Minutes Intravenous Every 8 hours 12/30/17 1356 12/30/17 1747   12/30/17 0545  ceFAZolin (ANCEF) 2-4 GM/100ML-% IVPB    Comments:  Waldron Session   : cabinet override      12/30/17 0545 12/30/17 0756   12/30/17 0542  ceFAZolin (ANCEF) IVPB 2g/100 mL premix     2 g 200 mL/hr over 30 Minutes Intravenous On call to O.R. 12/30/17 0542 12/30/17 0826      Assessment/Plan: s/p Procedure(s) with comments: DIAGNOSTIC LAPAROSCOPY WITH OPEN DISTAL GASTRECTOMY AND PLACEMENT JEJUNOSTOMY FEEDING  TUBE (N/A) - EPIDURAL Continue tube feeds for now at goal  VDRF per CCM. Possible aspiration. Continue abx Cr elevated. Will consult cardiology to evaluate. Ok to heparinize at this point if needed. Would try to avoid bolus Gastric cancer  LOS: 5 days    TOTH III,Rosaly Labarbera S 01/04/2018

## 2018-01-04 NOTE — Progress Notes (Signed)
Pharmacy Antibiotic Note  [name]. is a 84 y.o. male admitted on 12/30/2017 with aspiration PNA.  Pharmacy has been consulted for Zosyn dosing.  Pharmacy is now consulted to add Vancomycin with GPC in BAL.  Today, 01/04/2018: D2 Zosyn Tm 102.1 SCr 1.37 WBC 4.8  Plan: Continue Zosyn 3.375g IV Q8H infused over 4hrs. Vancomycin 1559m IV x1 dose now, followed by 1g IV q24h. Measure Vanc peak, trough at steady state.  Goal AUC 400-500. Follow up renal fxn, culture results, and clinical course.   Height: 6' (182.9 cm) Weight: 175 lb 11.3 oz (79.7 kg) IBW/kg (Calculated) : 77.6  Temp (24hrs), Avg:99.2 F (37.3 C), Min:98 F (36.7 C), Max:102.1 F (38.9 C)  Recent Labs  Lab 01/01/18 2102 01/02/18 0333 01/02/18 1126 01/03/18 0010 01/03/18 0030 01/03/18 0710 01/04/18 0449  WBC 5.2 5.8  --  6.3  --  4.6 4.8  CREATININE 0.99 0.87 0.79 1.21 1.10 1.38* 1.37*  LATICACIDVEN  --   --  0.9 2.1*  --  1.6  --     Estimated Creatinine Clearance: 45.6 mL/min (A) (by C-G formula based on SCr of 1.37 mg/dL (H)).    Allergies  Allergen Reactions  . Niacin Hives    Antimicrobials this admission: 3/5 cefazolin pre and post-op 3/9 Zosyn >>  3/10 Vanc >>  Dose adjustments this admission:   Microbiology results:  3/5 MRSA PCR: negative 3/9 BCx:  3/9 Trach asp: Stain = abundant GPC and GNR, rare GPR; Cxt pending 3/9 BAL: Stain = few GPC chains; Cxt pending    Thank you for allowing pharmacy to be a part of this patient's care.  CGretta ArabPharmD, BCPS Pager [phone]/07/2018 9:59 AM

## 2018-01-04 NOTE — Procedures (Signed)
Extubation Procedure Note  Patient Details:   Name: [name]. DOB: 05/24/1933 MRN: [id number]   Airway Documentation:     Evaluation  O2 sats: stable throughout Complications: No apparent complications Patient did tolerate procedure well. Bilateral Breath Sounds: Diminished  Pt extubated to 2 lpm [place], and able to speak.    Yes  Tamala Julian 01/04/2018, 10:37 AM

## 2018-01-05 ENCOUNTER — Inpatient Hospital Stay (HOSPITAL_COMMUNITY): Payer: Medicare Other

## 2018-01-05 DIAGNOSIS — R748 Abnormal levels of other serum enzymes: Secondary | ICD-10-CM

## 2018-01-05 DIAGNOSIS — J189 Pneumonia, unspecified organism: Secondary | ICD-10-CM

## 2018-01-05 DIAGNOSIS — J9601 Acute respiratory failure with hypoxia: Secondary | ICD-10-CM

## 2018-01-05 LAB — BLOOD GAS, ARTERIAL
ACID-BASE DEFICIT: 2.2 mmol/L — AB (ref 0.0–2.0)
Acid-base deficit: 3.5 mmol/L — ABNORMAL HIGH (ref 0.0–2.0)
Bicarbonate: 24.4 mmol/L (ref 20.0–28.0)
Bicarbonate: 19.4 mmol/L — ABNORMAL LOW (ref 20.0–28.0)
Drawn by: 51425
Drawn by: 51425
FIO2: 100
FIO2: 70
MECHVT: 620 mL
O2 SAT: 99.1 %
O2 Saturation: 99.2 %
PCO2 ART: 53 mmHg — AB (ref 32.0–48.0)
PEEP: 5 cmH2O
PH ART: 7.285 — AB (ref 7.350–7.450)
PO2 ART: 242 mmHg — AB (ref 83.0–108.0)
Patient temperature: 98.6
Patient temperature: 98.6
RATE: 18 resp/min
pCO2 arterial: 29.1 mmHg — ABNORMAL LOW (ref 32.0–48.0)
pH, Arterial: 7.44 (ref 7.350–7.450)
pO2, Arterial: 153 mmHg — ABNORMAL HIGH (ref 83.0–108.0)

## 2018-01-05 LAB — GLUCOSE, CAPILLARY
GLUCOSE-CAPILLARY: 101 mg/dL — AB (ref 65–99)
GLUCOSE-CAPILLARY: 98 mg/dL (ref 65–99)
GLUCOSE-CAPILLARY: 98 mg/dL (ref 65–99)
Glucose-Capillary: 105 mg/dL — ABNORMAL HIGH (ref 65–99)
Glucose-Capillary: 105 mg/dL — ABNORMAL HIGH (ref 65–99)
Glucose-Capillary: 103 mg/dL — ABNORMAL HIGH (ref 65–99)

## 2018-01-05 LAB — CREATININE, SERUM
CREATININE: 1.29 mg/dL — AB (ref 0.61–1.24)
GFR calc Af Amer: 58 mL/min — ABNORMAL LOW (ref >60)
GFR, EST NON AFRICAN AMERICAN: 50 mL/min — AB (ref >60)

## 2018-01-05 LAB — CULTURE, RESPIRATORY W GRAM STAIN: Culture: NORMAL

## 2018-01-05 LAB — PROCALCITONIN: Procalcitonin: 6.45 ng/mL

## 2018-01-05 LAB — CULTURE, RESPIRATORY

## 2018-01-05 MED ORDER — CARVEDILOL 6.25 MG PO TABS
6.2500 mg | ORAL_TABLET | Freq: Two times a day (BID) | ORAL | Status: DC
  Administered 2018-01-05 – 2018-01-22 (×34): 6.25 mg
  Filled 2018-01-05 (×31): qty 1

## 2018-01-05 MED ORDER — ENSURE ENLIVE PO LIQD
237.0000 mL | Freq: Two times a day (BID) | ORAL | Status: DC
  Administered 2018-01-05: 237 mL via ORAL

## 2018-01-05 MED ORDER — HYDRALAZINE HCL 20 MG/ML IJ SOLN
5.0000 mg | Freq: Four times a day (QID) | INTRAMUSCULAR | Status: DC | PRN
  Administered 2018-01-05 – 2018-01-07 (×2): 5 mg via INTRAVENOUS
  Filled 2018-01-05 (×3): qty 1

## 2018-01-05 MED ORDER — PROCHLORPERAZINE EDISYLATE 5 MG/ML IJ SOLN
10.0000 mg | Freq: Four times a day (QID) | INTRAMUSCULAR | Status: DC | PRN
  Administered 2018-01-06 – 2018-01-16 (×5): 10 mg via INTRAVENOUS
  Filled 2018-01-05 (×5): qty 2

## 2018-01-05 MED ORDER — FUROSEMIDE 40 MG PO TABS
40.0000 mg | ORAL_TABLET | Freq: Every day | ORAL | Status: DC
  Administered 2018-01-05 – 2018-01-09 (×5): 40 mg via ORAL
  Filled 2018-01-05 (×5): qty 1

## 2018-01-05 MED ORDER — OSMOLITE 1.2 CAL PO LIQD
1000.0000 mL | ORAL | Status: DC
  Administered 2018-01-06: 1000 mL

## 2018-01-05 MED ORDER — ONDANSETRON HCL 4 MG/2ML IJ SOLN
4.0000 mg | INTRAMUSCULAR | Status: DC | PRN
  Administered 2018-01-06 – 2018-01-18 (×11): 4 mg via INTRAVENOUS
  Filled 2018-01-05 (×13): qty 2

## 2018-01-05 MED ORDER — ONDANSETRON 4 MG PO TBDP
4.0000 mg | ORAL_TABLET | ORAL | Status: DC | PRN
  Administered 2018-01-16 – 2018-01-19 (×3): 4 mg via ORAL
  Filled 2018-01-05 (×3): qty 1

## 2018-01-05 MED ORDER — OSMOLITE 1.2 CAL PO LIQD: 1000.0000 mL | ORAL | Status: DC

## 2018-01-05 NOTE — Progress Notes (Signed)
6 Days Post-Op   Subjective/Chief Complaint: Events of weekend noted.  ? Aspiration as cause for respiratory distress.  Tolerating tube feeds and stooling.   Extubated.     Objective: Vital signs in last 24 hours: Temp:  [98.2 F (36.8 C)-99.7 F (37.6 C)] 99 F (37.2 C) (03/11 0400) Pulse Rate:  [95-131] 100 (03/11 0800) Resp:  [16-26] 20 (03/11 0800) BP: (116-194)/(59-97) 177/71 (03/11 0800) SpO2:  [93 %-100 %] 96 % (03/11 0800) Weight:  [81.9 kg (180 lb 8.9 oz)-83.5 kg (184 lb 1.4 oz)] 83.5 kg (184 lb 1.4 oz) (03/11 0400) Last BM Date: 01/05/18  Intake/Output from previous day: 03/10 0701 - 03/11 0700 In: 3702.9 [P.O.:50; I.V.:1375.2; NG/GT:1427.7; IV Piggyback:750] Out: 2245 [Urine:2245] Intake/Output this shift: Total I/O In: 120 [I.V.:75; NG/GT:45] Out: -   General appearance: alert.  Able to converse.  Weak, but not confused.   Resp: breathing comfortably Cardio: regular rhythm, but sl tachy GI: soft, non distended  Lab Results:  Recent Labs    01/03/18 0710 01/04/18 0449  WBC 4.6 4.8  HGB 10.7* 8.9*  HCT 34.6* 28.4*  PLT 179 126*   BMET Recent Labs    01/03/18 0710 01/04/18 0449 01/05/18 0443  NA 144 145  --   K 3.8 3.8  --   CL 118* 120*  --   CO2 21* 20*  --   GLUCOSE 90 127*  --   BUN 22* 24*  --   CREATININE 1.38* 1.37* 1.29*  CALCIUM 8.0* 7.7*  --    PT/INR No results for input(s): LABPROT, INR in the last 72 hours. ABG Recent Labs    01/02/18 2204 01/04/18 1007  PHART 7.440 7.407  HCO3 19.4* 19.6*    Studies/Results: Dg Chest Port 1 View  Result Date: 01/03/2018 CLINICAL DATA:  S/p right lobe bronchoscopy EXAM: PORTABLE CHEST 1 VIEW COMPARISON:  Chest x-ray from earlier same day. Chest x-ray dated 01/02/2018. FINDINGS: Improved aeration at the right lung base, presumably resolving airspace collapse. Commensurate reduction of the rightward shift of the mediastinal structures. Endotracheal tube remains well positioned with tip  approximately 2.5 cm above the carina. Enteric tube passes below the diaphragm. Left IJ central line is stable in position with tip at the level of the upper SVC. Heart size is stable.  No pneumothorax seen. IMPRESSION: 1. Improved aeration at the right lung base compared to the chest x-ray from earlier today, presumably resolving airspace collapse status post bronchoscopy. Commensurate reduction of the rightward shift of the mediastinal structures. 2. Support apparatus appears stable in position. Electronically Signed   By: Franki Cabot M.D.   On: 01/03/2018 12:32    Anti-infectives: Anti-infectives (From admission, onward)   Start     Dose/Rate Route Frequency Ordered Stop   01/05/18 1000  vancomycin (VANCOCIN) IVPB 1000 mg/200 mL premix     1,000 mg 200 mL/hr over 60 Minutes Intravenous Every 24 hours 01/04/18 0956     01/04/18 1030  vancomycin (VANCOCIN) 1,500 mg in sodium chloride 0.9 % 500 mL IVPB     1,500 mg 250 mL/hr over 120 Minutes Intravenous  Once 01/04/18 0956 01/04/18 1314   01/03/18 0200  piperacillin-tazobactam (ZOSYN) IVPB 3.375 g     3.375 g 12.5 mL/hr over 240 Minutes Intravenous Every 8 hours 01/03/18 0112     12/30/17 1714  ceFAZolin (ANCEF) 2-4 GM/100ML-% IVPB    Comments:  Otelia Sergeant   : cabinet override      12/30/17 1714 12/31/17 0529  12/30/17 1600  ceFAZolin (ANCEF) IVPB 2g/100 mL premix     2 g 200 mL/hr over 30 Minutes Intravenous Every 8 hours 12/30/17 1356 12/30/17 1747   12/30/17 0545  ceFAZolin (ANCEF) 2-4 GM/100ML-% IVPB    Comments:  Waldron Session   : cabinet override      12/30/17 0545 12/30/17 0756   12/30/17 0542  ceFAZolin (ANCEF) IVPB 2g/100 mL premix     2 g 200 mL/hr over 30 Minutes Intravenous On call to O.R. 12/30/17 0542 12/30/17 0962      Assessment/Plan: s/p Procedure(s) with comments: DIAGNOSTIC LAPAROSCOPY WITH OPEN DISTAL GASTRECTOMY AND PLACEMENT JEJUNOSTOMY FEEDING TUBE (N/A) - EPIDURAL Start diet. Decrease tube feeds.     Elevated troponin felt to be demand.  Cards rec restart lasix and coreg.  Adding lasix today.    Gastric cancer- LN positive.  Likely not a chemo candidate due to neutropenia from prior Mantle cell lymphoma therapy.   LOS: 6 days    Stark Klein 01/05/2018

## 2018-01-05 NOTE — Progress Notes (Signed)
Nutrition Follow-up  DOCUMENTATION CODES:   Not applicable  INTERVENTION:    Begin trickle Osmolite 1.2 @ 20 ml/hr via J tube  Monitor PO intake closely to determine rate increase  Add Ensure Enlive po BID, each supplement provides 350 kcal and 20 grams of protein  NUTRITION DIAGNOSIS:   Increased nutrient needs related to catabolic illness, cancer and cancer related treatments, wound healing as evidenced by estimated needs.  GOAL:   Patient will meet greater than or equal to 90% of their needs  MONITOR:   TF tolerance, Weight trends, Labs  REASON FOR ASSESSMENT:   Consult Enteral/tube feeding initiation and management  ASSESSMENT:   84 year old male with gastric cancer who presented with an abnormal PET scan on follow-up for lymphoma (Stage IV, dx 08/2016).  He had a gastric mass seen on the PET scan and upper endoscopy confirmed gastric cancer in this location. The patient denies any issues in appetite.  He has not had any weight loss. His energy level has improved significantly since his treatment for lymphoma and is s/p chemo therapy for lymphoma. He has also seen radiation oncology. His bone marrow bx was negative for marrow involvement with lymphoma.    3/5- laparoscopy, gastrectomy with PEJ placement 3/10- extubated  Pt receiving Osmolite 1.2 @ 70 ml/hr upon RD visit. Pt began vomiting at 11 am this morning almost immediatly after breakfast. Tube feeding was turned off. Pt is currently on a full liquid diet with no meal completions charted. Recommend beginning trickle feedings tonight. Will monitor PO intake to assess need for further tube feeding rate advancement.   Weight noted to increase 1 lb since last RD visit 3/7 (183 lb to 184 lb).  I/O: + 3350 ml since admit UOP: 950 ml this morning  Medications reviewed and include: 40 mg lasix, NaCl with 20 mEq and D5 @ 10 ml/hr, IV abx Labs reviewed: Creatinine 1.29 (H)  Diet Order:  Diet full liquid Room service  appropriate? Yes; Fluid consistency: Thin  EDUCATION NEEDS:   Not appropriate for education at this time  Skin:  Skin Assessment: Skin Integrity Issues: Skin Integrity Issues:: Incisions Incisions: abdominal (3/5)  Last BM:  01/05/18  Height:   Ht Readings from Last 1 Encounters:  12/30/17 6' (1.829 m)    Weight:   Wt Readings from Last 1 Encounters:  01/05/18 184 lb 1.4 oz (83.5 kg)    Ideal Body Weight:  80.91 kg  BMI:  Body mass index is 24.97 kg/m.  Estimated Nutritional Needs:   Kcal:  2330-2500 (28-30 kcal/kg)  Protein:  125-140 grams (1.5-1.7 grams/kg)  Fluid:  >/= 2.3 L/day    Mariana Single RD, LDN Clinical Nutrition Pager # - [phone]

## 2018-01-05 NOTE — Plan of Care (Signed)
Patient progressed well through night post intubation.  Kept to ice chips and has tolerated well.  He ambulated to chair after bath in morning.  BP has been more elevated in morning.  Would expect MD to restart more of patients home BP meds. He has been pleasant and oriented all night.

## 2018-01-05 NOTE — Progress Notes (Signed)
PULMONARY / CRITICAL CARE MEDICINE   Name: [name]. MRN: [id number] DOB: 1933-08-14    ADMISSION DATE:  12/30/2017 CONSULTATION DATE:  01/02/18  REFERRING MD:  Mamie Laurel MD  CHIEF COMPLAINT: Postop delirium  HISTORY OF PRESENT ILLNESS:   84 year old with history of hypertension, dyslipidemia, psoriasis, fibromyalgia, coronary artery disease, GERD, mantle cell lymphoma, gastric adenocarcinoma. Admitted on 3/4 and underwent resection by Dr. [name] on 3/5.  Postop the epidural catheter was left in place with infusion of bupivacaine, PCA pump.  He has become increasingly confused since 3/7.  Noted to be hypotensive last night which responded to IV fluids.  PCA has been stopped and Benadryl and Robinul stopped.  PCCM consulted for help with further management.  SUBJECTIVE:  Feels nauseated   VITAL SIGNS: BP (!) 172/93   Pulse (!) 102   Temp 98.6 F (37 C) (Oral)   Resp 17   Ht 6' (1.829 m)   Wt 184 lb 1.4 oz (83.5 kg)   SpO2 96%   BMI 24.97 kg/m   HEMODYNAMICS: CVP:  [6 mmHg-7 mmHg] 6 mmHg  VENTILATOR SETTINGS:    INTAKE / OUTPUT: I/O last 3 completed shifts: In: 5035 [P.O.:50; I.V.:1992.7; Other:100; NG/GT:2142.3; IV Piggyback:750] Out: 2845 [Urine:2845]  PHYSICAL EXAMINATION: Gen:   84 year old white male. Resting comfortably & in no distress.  HEENT:  NCAT, no JVD MMM Lungs: some rhonchi that cl w/ cough. No accessory use  CV:  RRR w/out MRG Abd:  + bowel sounds. Not tender this afternoon. Mid abd staples intact. J tube unremarkable  Ext:No edema brisk CR  Skin:   Warm and dry  Neuro:awake and oriented  LABS:  BMET Recent Labs  Lab 01/03/18 0010 01/03/18 0030 01/03/18 0710 01/04/18 0449 01/05/18 0443  NA 147* 144 144 145  --   K 4.5 4.1 3.8 3.8  --   CL 122* 111 118* 120*  --   CO2 22  --  21* 20*  --   BUN 18 16 22* 24*  --   CREATININE 1.21 1.10 1.38* 1.37* 1.29*  GLUCOSE 104* 100* 90 127*  --     Electrolytes Recent Labs  Lab  01/03/18 0010 01/03/18 0100 01/03/18 0710 01/04/18 0449  CALCIUM 7.9*  --  8.0* 7.7*  MG  --  2.0  --   --   PHOS 3.3  --   --   --     CBC Recent Labs  Lab 01/03/18 0010 01/03/18 0030 01/03/18 0710 01/04/18 0449  WBC 6.3  --  4.6 4.8  HGB 9.6* 9.9* 10.7* 8.9*  HCT 30.6* 29.0* 34.6* 28.4*  PLT 156  --  179 126*    Coag's No results for input(s): APTT, INR in the last 168 hours.  Sepsis Markers Recent Labs  Lab 01/02/18 1126 01/03/18 0010 01/03/18 0710 01/04/18 0449 01/05/18 0443  LATICACIDVEN 0.9 2.1* 1.6  --   --   PROCALCITON  --   --  6.68 9.81 6.45    ABG Recent Labs  Lab 01/02/18 2012 01/02/18 2204 01/04/18 1007  PHART 7.285* 7.440 7.407  PCO2ART 53.0* 29.1* 31.8*  PO2ART 242* 153* 128*    Liver Enzymes Recent Labs  Lab 01/02/18 1126  AST 29  ALT 14*  ALKPHOS 53  BILITOT 0.8  ALBUMIN 3.2*    Cardiac Enzymes Recent Labs  Lab 01/03/18 0950 01/03/18 1550 01/03/18 2148  TROPONINI 1.85* 1.64* 1.16*    Glucose Recent Labs  Lab 01/04/18 1657  01/04/18 2110 01/04/18 2347 01/05/18 0423 01/05/18 0746 01/05/18 1125  GLUCAP 134* 111* 112* 98 105* 105*    Imaging No results found.  STUDIES:   CULTURES: Blood cultures 3/9 > Resp culture 3/9 >normal flora   ANTIBIOTICS: Zosyn 3/8>   vanco 3/10>3/11  SIGNIFICANT EVENTS: 3/5 > laparoscopy, gastrectomy 3/8 > consult for delirium, intubated, CVL, pressors 3/9 > Bronch, BAL  LINES/TUBES:  ASSESSMENT / PLAN: 84 year old with gastric adenocarcinoma status post gastrectomy. Postop confusion, aspiration. Intubated  Respiratory failure in setting of aspiration Pneumonia  -extubated 3/10 -no on room air Plan Mobilize IS and flutter Dc vanc Zosyn day 4/7 zosyn  F/u cxr   Septic shock, sinus tachycardia Elevated troponin. Likely demand, /o CAD w/ CM (EF 35-40%)  Plan Cont asa and coreg   Encephalopathy Secondary to sepsis, postop delirium Plan Supportive care    Erick Colace ACNP-BC [place] Pager # [phone] OR # [phone] if no answer

## 2018-01-05 NOTE — Progress Notes (Addendum)
Progress Note  Patient Name: [name]. Date of Encounter: 01/05/2018  Primary Cardiologist: Dorris Carnes, MD   Subjective   Just got up to chair, weak, no chest pain.  Inpatient Medications    Scheduled Meds: . aspirin EC  81 mg Oral Daily  . carvedilol  3.125 mg Per Tube BID WC  . Chlorhexidine Gluconate Cloth  6 each Topical Daily  . enoxaparin (LOVENOX) injection  40 mg Subcutaneous Q24H  . feeding supplement (OSMOLITE 1.2 CAL)  1,000 mL Per Tube Q24H  . furosemide  40 mg Oral Daily  . pantoprazole (PROTONIX) IV  40 mg Intravenous Daily  . sodium chloride flush  10-40 mL Intracatheter Q12H   Continuous Infusions: . dextrose 5 % and 0.45 % NaCl with KCl 20 mEq/L 50 mL/hr at 01/04/18 2228  . fentaNYL infusion INTRAVENOUS Stopped (01/04/18 1202)  . norepinephrine (LEVOPHED) Adult infusion Stopped (01/03/18 1245)  . phenylephrine (NEO-SYNEPHRINE) Adult infusion Stopped (01/03/18 1245)  . piperacillin-tazobactam (ZOSYN)  IV Stopped (01/05/18 3785)  . vancomycin     PRN Meds: fentaNYL, haloperidol lactate, midazolam, midazolam, nitroGLYCERIN, ondansetron **OR** ondansetron (ZOFRAN) IV, sodium chloride flush   Vital Signs    Vitals:   01/05/18 0300 01/05/18 0400 01/05/18 0600 01/05/18 0800  BP:  (!) 194/97 (!) 171/85 (!) 177/71  Pulse: 96 (!) 116 95 100  Resp: 17 (!) 26 17 20   Temp:  99 F (37.2 C)  98.9 F (37.2 C)  TempSrc:  Oral  Oral  SpO2: 98% 98% 99% 96%  Weight:  184 lb 1.4 oz (83.5 kg)    Height:        Intake/Output Summary (Last 24 hours) at 01/05/2018 1029 Last data filed at 01/05/2018 0800 Gross per 24 hour  Intake 3370.18 ml  Output 1945 ml  Net 1425.18 ml   Filed Weights   01/03/18 1115 01/04/18 2100 01/05/18 0400  Weight: 175 lb 11.3 oz (79.7 kg) 180 lb 8.9 oz (81.9 kg) 184 lb 1.4 oz (83.5 kg)    Telemetry    NSR, ST - Personally Reviewed  ECG    NSR, ST RBBB, LAFB - Personally Reviewed  Physical Exam   GEN: No acute  distress.   Neck: No JVD Cardiac: RRR, no murmurs, rubs, or gallops.  Respiratory: decreased BS Lt base GI: Soft, gastric tube in place MS: No edema; No deformity. Neuro:  Nonfocal  Psych: Normal affect   Labs    Chemistry Recent Labs  Lab 01/02/18 1126 01/03/18 0010 01/03/18 0030 01/03/18 0710 01/04/18 0449 01/05/18 0443  NA 140 147* 144 144 145  --   K 4.7 4.5 4.1 3.8 3.8  --   CL 111 122* 111 118* 120*  --   CO2 20* 22  --  21* 20*  --   GLUCOSE 114* 104* 100* 90 127*  --   BUN 16 18 16  22* 24*  --   CREATININE 0.79 1.21 1.10 1.38* 1.37* 1.29*  CALCIUM 8.3* 7.9*  --  8.0* 7.7*  --   PROT 5.4*  --   --   --   --   --   ALBUMIN 3.2*  --   --   --   --   --   AST 29  --   --   --   --   --   ALT 14*  --   --   --   --   --   ALKPHOS 53  --   --   --   --   --  BILITOT 0.8  --   --   --   --   --   GFRNONAA >60 54*  --  46* 46* 50*  GFRAA >60 >60  --  53* 54* 58*  ANIONGAP 9 3*  --  5 5  --      Hematology Recent Labs  Lab 01/03/18 0010 01/03/18 0030 01/03/18 0710 01/04/18 0449  WBC 6.3  --  4.6 4.8  RBC 3.18*  --  3.64* 2.96*  HGB 9.6* 9.9* 10.7* 8.9*  HCT 30.6* 29.0* 34.6* 28.4*  MCV 96.2  --  95.1 95.9  MCH 30.2  --  29.4 30.1  MCHC 31.4  --  30.9 31.3  RDW 16.9*  --  16.8* 17.4*  PLT 156  --  179 126*    Cardiac Enzymes Recent Labs  Lab 01/02/18 1126 01/03/18 0950 01/03/18 1550 01/03/18 2148  TROPONINI 0.06* 1.85* 1.64* 1.16*   No results for input(s): TROPIPOC in the last 168 hours.   BNPNo results for input(s): BNP, PROBNP in the last 168 hours.   DDimer No results for input(s): DDIMER in the last 168 hours.   Radiology    Dg Chest Port 1 View  Result Date: 01/03/2018 CLINICAL DATA:  S/p right lobe bronchoscopy EXAM: PORTABLE CHEST 1 VIEW COMPARISON:  Chest x-ray from earlier same day. Chest x-ray dated 01/02/2018. FINDINGS: Improved aeration at the right lung base, presumably resolving airspace collapse. Commensurate reduction of the  rightward shift of the mediastinal structures. Endotracheal tube remains well positioned with tip approximately 2.5 cm above the carina. Enteric tube passes below the diaphragm. Left IJ central line is stable in position with tip at the level of the upper SVC. Heart size is stable.  No pneumothorax seen. IMPRESSION: 1. Improved aeration at the right lung base compared to the chest x-ray from earlier today, presumably resolving airspace collapse status post bronchoscopy. Commensurate reduction of the rightward shift of the mediastinal structures. 2. Support apparatus appears stable in position. Electronically Signed   By: Franki Cabot M.D.   On: 01/03/2018 12:32    Cardiac Studies   Echo 12/25/17- Study Conclusions  - Left ventricle: Diffuse hypokinesis worse in the septum , apex   and mid-basal inferior wall. Wall thickness was increased in a   pattern of mild LVH. Systolic function was moderately reduced.   The estimated ejection fraction was in the range of 35% to 40%.   Doppler parameters are consistent with abnormal left ventricular   relaxation (grade 1 diastolic dysfunction). - Aortic valve: There was mild regurgitation. - Left atrium: The atrium was mildly dilated. - Atrial septum: No defect or patent foramen ovale was identified.  Patient Profile     84 y.o. male recently diagnosed with gastric cancer with a history of stable CAD, s/p CFX PCI/stent in 2009, seen by Dr Harrington Challenger in Feb and cleared for surgery (distal gastrectomy with Billroth II anastamosis and jejunostomy feeding tube) which was done 12/30/17. Post op course has been complicated by hypotension and respiratory failure with right lower lobe infiltrate with collapse related to mucous plugging. He was transiently intubated.  He was felt to have sepsis and required vasopressors. His troponin was elevated, peaked at 1.85, but is trending down.    Assessment & Plan    Elevated Troponin- likely secondary to the acute respiratory  distress.  There are no acute EKG changes and the  Demand ischemia likely.  No indication for heparin.  Needs to be on ASA as  soon as this is felt to be safe from a surgical standpoint.   CAD- History of CFX PCI and stent 2009, no recent angina  Gastric cancer- S/p gastrectomy 03/03/00 complicated by post op respiratory failure and shock  Cardiomyopathy Last EF 35-40% by echo Feb  HTN- B/P up, pt is NPO, Coreg resumed via tube  RBBB/LAFB chronic  Plan: Hydralazine PRN, not sure if his coreg is being absorbed- pt having N&V.   For questions or updates, please contact [place] Please consult [email or web address] for contact info under Cardiology/STEMI.      Signed, Kerin Ransom, PA-C  01/05/2018, 10:29 AM    History and all data above reviewed.  Patient examined.  I agree with the findings as above.  He is nauseated.  No acute SOB or chest pain.  No abdominal pain.  The patient exam reveals COR:RRR  ,  Lungs: Clear  ,  Abd:  Absent bowel sounds but no rebound or guarding. Ext No edema   .  All available labs, radiology testing, previous records reviewed. Agree with documented assessment and plan. HTN agree with hydralazine PRN.  Elevated troponin:  I do not suspect any acute ischemic event.  Not sure how Coreg is being absorbed as above but I am going to increase the Coreg.   [name]  1:01 PM  01/05/2018

## 2018-01-05 NOTE — Progress Notes (Signed)
Physical Therapy Treatment Patient Details Name: [name]. MRN: [id number] DOB: 05-Jul-1933 Today's Date: 01/05/2018    History of Present Illness 84 yo male admitted 12/30/17 with recurrent gastric cancer. S/P DIAGNOSTIC LAPAROSCOPY WITH OPEN DISTAL GASTRECTOMY AND PLACEMENT JEJUNOSTOMY FEEDING TUBE (N/A) - EPIDURAL Required Intubation for ;ow BP, increased confusion over weekend.    PT Comments    The patient amazingly mobilizes well considering the events. HR up to 129, O2 sats on RA 100%. Continue PT.   Follow Up Recommendations  SNF     Equipment Recommendations  None recommended by PT    Recommendations for Other Services       Precautions / Restrictions Precautions Precautions: Fall Precaution Comments: central line, PEG,     Mobility  Bed Mobility Overal bed mobility: Needs Assistance Bed Mobility: Supine to Sit     Supine to sit: Mod assist     General bed mobility comments: assist with trunk, extra time.  Transfers Overall transfer level: Needs assistance Equipment used: Rolling walker (2 wheeled) Transfers: Sit to/from [facility] Sit to Stand: +2 physical assistance;+2 safety/equipment;Min assist;Mod assist Stand pivot transfers: +2 physical assistance;+2 safety/equipment;Min assist;Mod assist       General transfer comment: assist to rise from the bed, Able to take several steps to Recliner using RW. . Multimodal cueas for safety. patient is fidgety.   Ambulation/Gait                 Stairs            Wheelchair Mobility    Modified Rankin (Stroke Patients Only)       Balance                                            Cognition Arousal/Alertness: Awake/alert Behavior During Therapy: Restless   Area of Impairment: Orientation                 Orientation Level: Time;Situation                    Exercises      General Comments        Pertinent Vitals/Pain  Pain Assessment: No/denies pain    Home Living                      Prior Function            PT Goals (current goals can now be found in the care plan section) Progress towards PT goals: Progressing toward goals    Frequency    Min 2X/week      PT Plan Current plan remains appropriate    Co-evaluation              AM-PAC PT "6 Clicks" Daily Activity  Outcome Measure  Difficulty turning over in bed (including adjusting bedclothes, sheets and blankets)?: Unable Difficulty moving from lying on back to sitting on the side of the bed? : Unable Difficulty sitting down on and standing up from a chair with arms (e.g., wheelchair, bedside commode, etc,.)?: Unable Help needed moving to and from a bed to chair (including a wheelchair)?: Total Help needed walking in hospital room?: Total Help needed climbing 3-5 steps with a railing? : Total 6 Click Score: 6    End of Session   Activity Tolerance: Patient tolerated treatment well  Patient left: in chair;with call bell/phone within reach Nurse Communication: Mobility status PT Visit Diagnosis: Unsteadiness on feet (R26.81)     Time: 0919-8022 PT Time Calculation (min) (ACUTE ONLY): 29 min  Charges:  $Therapeutic Activity: 23-37 mins                    G CodesTresa Endo PT [phone]    Claretha Cooper 01/05/2018, 12:39 PM

## 2018-01-06 ENCOUNTER — Encounter (HOSPITAL_COMMUNITY): Payer: Self-pay

## 2018-01-06 ENCOUNTER — Other Ambulatory Visit: Payer: Self-pay

## 2018-01-06 LAB — COMPREHENSIVE METABOLIC PANEL
ALT: 34 U/L (ref 17–63)
ANION GAP: 9 (ref 5–15)
AST: 43 U/L — AB (ref 15–41)
Albumin: 2.5 g/dL — ABNORMAL LOW (ref 3.5–5.0)
Alkaline Phosphatase: 72 U/L (ref 38–126)
BUN: 22 mg/dL — AB (ref 6–20)
CHLORIDE: 115 mmol/L — AB (ref 101–111)
CO2: 26 mmol/L (ref 22–32)
Calcium: 8.5 mg/dL — ABNORMAL LOW (ref 8.9–10.3)
Creatinine, Ser: 1.33 mg/dL — ABNORMAL HIGH (ref 0.61–1.24)
GFR calc Af Amer: 56 mL/min — ABNORMAL LOW (ref >60)
GFR calc non Af Amer: 48 mL/min — ABNORMAL LOW (ref >60)
GLUCOSE: 99 mg/dL (ref 65–99)
Potassium: 3.3 mmol/L — ABNORMAL LOW (ref 3.5–5.1)
SODIUM: 150 mmol/L — AB (ref 135–145)
TOTAL PROTEIN: 5.2 g/dL — AB (ref 6.5–8.1)
Total Bilirubin: 1 mg/dL (ref 0.3–1.2)

## 2018-01-06 LAB — GLUCOSE, CAPILLARY
GLUCOSE-CAPILLARY: 108 mg/dL — AB (ref 65–99)
GLUCOSE-CAPILLARY: 91 mg/dL (ref 65–99)
GLUCOSE-CAPILLARY: 110 mg/dL — AB (ref 65–99)
Glucose-Capillary: 92 mg/dL (ref 65–99)
Glucose-Capillary: 107 mg/dL — ABNORMAL HIGH (ref 65–99)
Glucose-Capillary: 113 mg/dL — ABNORMAL HIGH (ref 65–99)

## 2018-01-06 LAB — CREATININE, SERUM
Creatinine, Ser: 1.27 mg/dL — ABNORMAL HIGH (ref 0.61–1.24)
GFR calc Af Amer: 59 mL/min — ABNORMAL LOW (ref >60)
GFR, EST NON AFRICAN AMERICAN: 51 mL/min — AB (ref >60)

## 2018-01-06 LAB — PHOSPHORUS: Phosphorus: 3.7 mg/dL (ref 2.5–4.6)

## 2018-01-06 LAB — CULTURE, BAL-QUANTITATIVE W GRAM STAIN
Culture: >=100000 — AB
Special Requests: NORMAL

## 2018-01-06 LAB — MAGNESIUM: Magnesium: 2 mg/dL (ref 1.7–2.4)

## 2018-01-06 MED ORDER — OSMOLITE 1.2 CAL PO LIQD: 1000.0000 mL | ORAL | Status: DC

## 2018-01-06 MED ORDER — DIPHENHYDRAMINE HCL 12.5 MG/5ML PO ELIX: 12.5000 mg | ORAL_SOLUTION | Freq: Four times a day (QID) | ORAL | Status: DC | PRN

## 2018-01-06 NOTE — Progress Notes (Signed)
Physical Therapy Treatment Patient Details Name: [name]. MRN: [id number] DOB: 12/15/1932 Today's Date: 01/06/2018    History of Present Illness   84 yo male admitted 12/30/17 with recurrent gastric cancer. S/P DIAGNOSTIC LAPAROSCOPY WITH OPEN DISTAL GASTRECTOMY AND PLACEMENT JEJUNOSTOMY FEEDING TUBE (N/A) - EPIDURAL Required Intubation for ;ow BP, increased confusion over weekend    PT Comments    Assisted OOB to amb a greater distance in hallway with walker and + 2 assist for safety equipment.  Pt c/o max fatigue.  "I feel wore out".  Pt had uncontrolled loose stools during last part of gait so assisted to bathroom.  Assisted with peri care and positioned in recliner.    Follow Up Recommendations  SNF     Equipment Recommendations  None recommended by PT    Recommendations for Other Services       Precautions / Restrictions Precautions Precautions: Fall Precaution Comments: central line, PEG,  Restrictions Weight Bearing Restrictions: No    Mobility  Bed Mobility Overal bed mobility: Needs Assistance Bed Mobility: Supine to Sit     Supine to sit: Mod assist     General bed mobility comments: assist with trunk, extra time.  Transfers Overall transfer level: Needs assistance Equipment used: Rolling walker (2 wheeled) Transfers: Sit to/from [facility] Sit to Stand: +2 physical assistance;+2 safety/equipment;Min assist;Mod assist Stand pivot transfers: +2 physical assistance;+2 safety/equipment;Min assist;Mod assist       General transfer comment: 25% VC's on proper hand placement and 50% VC's on safety with turns.  Assisted off bed and on/off toilet.    Ambulation/Gait Ambulation/Gait assistance: Min assist;+2 physical assistance;+2 safety/equipment Ambulation Distance (Feet): 220 Feet Assistive device: Rolling walker (2 wheeled) Gait Pattern/deviations: Step-through pattern;Decreased stride length;Trunk flexed Gait velocity:  decreased   General Gait Details: slight unsteady gait requiring walker for safety.  Tolerated an increased distance.  Fatigues quickly.     Stairs            Wheelchair Mobility    Modified Rankin (Stroke Patients Only)       Balance                                            Cognition Arousal/Alertness: Awake/alert Behavior During Therapy: Flat affect Overall Cognitive Status: Impaired/Different from baseline                                 General Comments: slow, sluggish but following all commands      Exercises      General Comments        Pertinent Vitals/Pain Pain Assessment: No/denies pain    Home Living                      Prior Function            PT Goals (current goals can now be found in the care plan section) Progress towards PT goals: Progressing toward goals    Frequency    Min 2X/week      PT Plan Current plan remains appropriate    Co-evaluation              AM-PAC PT "6 Clicks" Daily Activity  Outcome Measure  Difficulty turning over in bed (including adjusting bedclothes, sheets and blankets)?: A  Lot Difficulty moving from lying on back to sitting on the side of the bed? : A Lot Difficulty sitting down on and standing up from a chair with arms (e.g., wheelchair, bedside commode, etc,.)?: A Lot Help needed moving to and from a bed to chair (including a wheelchair)?: A Lot Help needed walking in hospital room?: A Lot Help needed climbing 3-5 steps with a railing? : A Lot 6 Click Score: 12    End of Session Equipment Utilized During Treatment: Gait belt Activity Tolerance: Patient limited by fatigue Patient left: in chair;with call bell/phone within reach Nurse Communication: Mobility status PT Visit Diagnosis: Unsteadiness on feet (R26.81)     Time: 4037-0964 PT Time Calculation (min) (ACUTE ONLY): 26 min  Charges:  $Gait Training: 8-22 mins $Therapeutic Activity:  8-22 mins                    G Codes:       Rica Koyanagi  PTA [facility]  Acute  Rehab Pager      [phone]

## 2018-01-06 NOTE — Progress Notes (Addendum)
Progress Note  Patient Name: [name]. Date of Encounter: 01/06/2018  Primary Cardiologist: Dorris Carnes, MD   Subjective   He looks a little better today. He says he is able to keep juice down.  Inpatient Medications    Scheduled Meds: . aspirin EC  81 mg Oral Daily  . carvedilol  6.25 mg Per Tube BID WC  . Chlorhexidine Gluconate Cloth  6 each Topical Daily  . enoxaparin (LOVENOX) injection  40 mg Subcutaneous Q24H  . feeding supplement (ENSURE ENLIVE)  237 mL Oral BID BM  . [START ON 01/07/2018] feeding supplement (OSMOLITE 1.2 CAL)  1,000 mL Per Tube Q24H  . furosemide  40 mg Oral Daily  . pantoprazole (PROTONIX) IV  40 mg Intravenous Daily  . sodium chloride flush  10-40 mL Intracatheter Q12H   Continuous Infusions: . dextrose 5 % and 0.45 % NaCl with KCl 20 mEq/L 10 mL/hr at 01/05/18 2024  . piperacillin-tazobactam (ZOSYN)  IV 3.375 g (01/06/18 1029)   PRN Meds: haloperidol lactate, hydrALAZINE, nitroGLYCERIN, ondansetron **OR** ondansetron (ZOFRAN) IV, prochlorperazine, sodium chloride flush   Vital Signs    Vitals:   01/06/18 0600 01/06/18 0800 01/06/18 1000 01/06/18 1131  BP: (!) 159/70 (!) 150/72 (!) 162/72   Pulse: 76 85 79   Resp: 18 19 16    Temp:  97.7 F (36.5 C)  98.1 F (36.7 C)  TempSrc:  Oral  Oral  SpO2: 97% 94% 96%   Weight:      Height:        Intake/Output Summary (Last 24 hours) at 01/06/2018 1157 Last data filed at 01/06/2018 1029 Gross per 24 hour  Intake 720 ml  Output 4401 ml  Net -3681 ml   Filed Weights   01/04/18 2100 01/05/18 0400 01/06/18 0354  Weight: 180 lb 8.9 oz (81.9 kg) 184 lb 1.4 oz (83.5 kg) 174 lb 6.1 oz (79.1 kg)    Telemetry    NSR, ST PVCs- Personally Reviewed  ECG    NSR, ST RBBB, LAFB - Personally Reviewed  Physical Exam   GEN: No acute distress.   Neck: No JVD Cardiac: RRR, no murmurs, rubs, or gallops.  Respiratory: decreased BS Lt base GI: Soft, gastric tube in place MS: No edema; No  deformity. Neuro:  Nonfocal  Psych: Normal affect   Labs    Chemistry Recent Labs  Lab 01/02/18 1126  01/03/18 0710 01/04/18 0449 01/05/18 0443 01/06/18 0617 01/06/18 0929  NA 140   < > 144 145  --   --  150*  K 4.7   < > 3.8 3.8  --   --  3.3*  CL 111   < > 118* 120*  --   --  115*  CO2 20*   < > 21* 20*  --   --  26  GLUCOSE 114*   < > 90 127*  --   --  99  BUN 16   < > 22* 24*  --   --  22*  CREATININE 0.79   < > 1.38* 1.37* 1.29* 1.27* 1.33*  CALCIUM 8.3*   < > 8.0* 7.7*  --   --  8.5*  PROT 5.4*  --   --   --   --   --  5.2*  ALBUMIN 3.2*  --   --   --   --   --  2.5*  AST 29  --   --   --   --   --  43*  ALT 14*  --   --   --   --   --  34  ALKPHOS 53  --   --   --   --   --  72  BILITOT 0.8  --   --   --   --   --  1.0  GFRNONAA >60   < > 46* 46* 50* 51* 48*  GFRAA >60   < > 53* 54* 58* 59* 56*  ANIONGAP 9   < > 5 5  --   --  9   < > = values in this interval not displayed.     Hematology Recent Labs  Lab 01/03/18 0010 01/03/18 0030 01/03/18 0710 01/04/18 0449  WBC 6.3  --  4.6 4.8  RBC 3.18*  --  3.64* 2.96*  HGB 9.6* 9.9* 10.7* 8.9*  HCT 30.6* 29.0* 34.6* 28.4*  MCV 96.2  --  95.1 95.9  MCH 30.2  --  29.4 30.1  MCHC 31.4  --  30.9 31.3  RDW 16.9*  --  16.8* 17.4*  PLT 156  --  179 126*    Cardiac Enzymes Recent Labs  Lab 01/02/18 1126 01/03/18 0950 01/03/18 1550 01/03/18 2148  TROPONINI 0.06* 1.85* 1.64* 1.16*   No results for input(s): TROPIPOC in the last 168 hours.   BNPNo results for input(s): BNP, PROBNP in the last 168 hours.   DDimer No results for input(s): DDIMER in the last 168 hours.   Radiology    Dg Chest Port 1 View  Result Date: 01/05/2018 CLINICAL DATA:  Pneumonia. EXAM: PORTABLE CHEST 1 VIEW COMPARISON:  Chest x-ray dated 01/03/2018. FINDINGS: Continued improvement in aeration at the right lung base, perhaps mild residual atelectasis. Probable additional mild atelectasis at the left lung base. Heart size and mediastinal  contours are stable. Endotracheal tube is been removed. Enteric tube has been removed. Left IJ central line is stable in position with tip at the level of the upper SVC. No pneumothorax seen. IMPRESSION: 1. Continued improvement in aeration at the right lung base. Probable mild residual atelectasis. Suspect additional mild atelectasis at the left lung base. 2. No new or developing opacity to suggest pneumonia. Electronically Signed   By: Franki Cabot M.D.   On: 01/05/2018 14:12    Cardiac Studies   Echo 12/25/17- Study Conclusions  - Left ventricle: Diffuse hypokinesis worse in the septum , apex   and mid-basal inferior wall. Wall thickness was increased in a   pattern of mild LVH. Systolic function was moderately reduced.   The estimated ejection fraction was in the range of 35% to 40%.   Doppler parameters are consistent with abnormal left ventricular   relaxation (grade 1 diastolic dysfunction). - Aortic valve: There was mild regurgitation. - Left atrium: The atrium was mildly dilated. - Atrial septum: No defect or patent foramen ovale was identified.  Patient Profile     84 y.o. male recently diagnosed with gastric cancer with a history of stable CAD, s/p CFX PCI/stent in 2009, seen by Dr Harrington Challenger in Feb and cleared for surgery (distal gastrectomy with Billroth II anastamosis and jejunostomy feeding tube) which was done 12/30/17. Post op course has been complicated by hypotension and respiratory failure with right lower lobe infiltrate with collapse related to mucous plugging. He was transiently intubated.  He was felt to have sepsis and required vasopressors. His troponin was elevated, peaked at 1.85, but is trending down.    Assessment & Plan  Elevated Troponin- likely secondary to the acute respiratory distress.  There are no acute EKG changes and demand ischemia likely.   CAD- History of CFX PCI and stent 2009, no recent angina  Gastric cancer- S/p gastrectomy 01/01/61 complicated  by post op respiratory failure and shock  Cardiomyopathy Last EF 35-40% by echo Feb  HTN- B/P improved on oral Coreg and PRN Hydralazine  RBBB/LAFB chronic  Plan: Same Rx- will follow.   For questions or updates, please contact [place] Please consult [email or web address] for contact info under Cardiology/STEMI.      Signed, Kerin Ransom, PA-C  01/06/2018, 11:57 AM    History and all data above reviewed.  Patient examined.  I agree with the findings as above.  He is much less nauseated today.  No acute SOB.  The patient exam reveals COR:RRR  ,  Lungs: Clear  ,  Abd: Positive bowel sounds, no rebound no guarding, Ext no edema   .  All available labs, radiology testing, previous records reviewed. Agree with documented assessment and plan. Elevated troponin.  No further work up.  BP slightly better today with control of his nausea and increased beta blocker.  We will follow and adjust meds as needed.  For now he seem to be euvolemic.    Jeneen Rinks Hochrein  12:28 PM  01/06/2018

## 2018-01-06 NOTE — Progress Notes (Signed)
Nutrition Brief Note  Spoke with surgery this morning regarding restarting tube feedings. Dr. [name] states its okay to restart feedings of Osmolite 1.2 @ 40 ml/hr. RD to change order. Will continue to monitor.   Mariana Single RD, LDN Clinical Nutrition Pager # [phone]

## 2018-01-06 NOTE — Progress Notes (Signed)
7 Days Post-Op   Subjective/Chief Complaint: Doing much better overall.  However, he had an episode of nausea yesterday and tube feeds were held.   Has already been OOB today.    Objective: Vital signs in last 24 hours: Temp:  [97.4 F (36.3 C)-98.9 F (37.2 C)] 97.7 F (36.5 C) (03/12 0800) Pulse Rate:  [74-106] 85 (03/12 0800) Resp:  [15-22] 19 (03/12 0800) BP: (97-172)/(51-80) 150/72 (03/12 0800) SpO2:  [92 %-97 %] 94 % (03/12 0800) Weight:  [79.1 kg (174 lb 6.1 oz)] 79.1 kg (174 lb 6.1 oz) (03/12 0354) Last BM Date: 01/05/18  Intake/Output from previous day: 03/11 0701 - 03/12 0700 In: 1303.8 [P.O.:240; I.V.:413.7; NG/GT:250.2; IV Piggyback:400] Out: 6295 [Urine:4150; Stool:1] Intake/Output this shift: Total I/O In: 70 [I.V.:20; Other:50] Out: 600 [Urine:600]  General appearance: alert.  Able to converse.  Much improved.     Resp: breathing comfortably Cardio: regular rhythm, but sl tachy GI: soft, non distended, inci9sion c/d/i.    Lab Results:  Recent Labs    01/04/18 0449  WBC 4.8  HGB 8.9*  HCT 28.4*  PLT 126*   BMET Recent Labs    01/04/18 0449  01/06/18 0617 01/06/18 0929  NA 145  --   --  150*  K 3.8  --   --  3.3*  CL 120*  --   --  115*  CO2 20*  --   --  26  GLUCOSE 127*  --   --  99  BUN 24*  --   --  22*  CREATININE 1.37*   < > 1.27* 1.33*  CALCIUM 7.7*  --   --  8.5*   < > = values in this interval not displayed.   PT/INR No results for input(s): LABPROT, INR in the last 72 hours. ABG Recent Labs    01/04/18 1007  PHART 7.407  HCO3 19.6*    Studies/Results: Dg Chest Port 1 View  Result Date: 01/05/2018 CLINICAL DATA:  Pneumonia. EXAM: PORTABLE CHEST 1 VIEW COMPARISON:  Chest x-ray dated 01/03/2018. FINDINGS: Continued improvement in aeration at the right lung base, perhaps mild residual atelectasis. Probable additional mild atelectasis at the left lung base. Heart size and mediastinal contours are stable. Endotracheal tube is  been removed. Enteric tube has been removed. Left IJ central line is stable in position with tip at the level of the upper SVC. No pneumothorax seen. IMPRESSION: 1. Continued improvement in aeration at the right lung base. Probable mild residual atelectasis. Suspect additional mild atelectasis at the left lung base. 2. No new or developing opacity to suggest pneumonia. Electronically Signed   By: Franki Cabot M.D.   On: 01/05/2018 14:12    Anti-infectives: Anti-infectives (From admission, onward)   Start     Dose/Rate Route Frequency Ordered Stop   01/05/18 1000  vancomycin (VANCOCIN) IVPB 1000 mg/200 mL premix  Status:  Discontinued     1,000 mg 200 mL/hr over 60 Minutes Intravenous Every 24 hours 01/04/18 0956 01/05/18 1319   01/04/18 1030  vancomycin (VANCOCIN) 1,500 mg in sodium chloride 0.9 % 500 mL IVPB     1,500 mg 250 mL/hr over 120 Minutes Intravenous  Once 01/04/18 0956 01/04/18 1314   01/03/18 0200  piperacillin-tazobactam (ZOSYN) IVPB 3.375 g     3.375 g 12.5 mL/hr over 240 Minutes Intravenous Every 8 hours 01/03/18 0112     12/30/17 1714  ceFAZolin (ANCEF) 2-4 GM/100ML-% IVPB    Comments:  Otelia Sergeant   :  cabinet override      12/30/17 1714 12/31/17 0529   12/30/17 1600  ceFAZolin (ANCEF) IVPB 2g/100 mL premix     2 g 200 mL/hr over 30 Minutes Intravenous Every 8 hours 12/30/17 1356 12/30/17 1747   12/30/17 0545  ceFAZolin (ANCEF) 2-4 GM/100ML-% IVPB    Comments:  Waldron Session   : cabinet override      12/30/17 0545 12/30/17 0756   12/30/17 0542  ceFAZolin (ANCEF) IVPB 2g/100 mL premix     2 g 200 mL/hr over 30 Minutes Intravenous On call to O.R. 12/30/17 0542 12/30/17 2620      Assessment/Plan: s/p Procedure(s) with comments: DIAGNOSTIC LAPAROSCOPY WITH OPEN DISTAL GASTRECTOMY AND PLACEMENT JEJUNOSTOMY FEEDING TUBE (N/A) - EPIDURAL Full liquids. Tube feeds at 40 ml/hr.  Elevated troponin felt to be demand.  Cards rec restart lasix and coreg.  Adding lasix  today.    Gastric cancer- LN positive.  Likely not a chemo candidate due to neutropenia from prior Mantle cell lymphoma therapy. lovenox and SCDs for VTE ppx. Floor tomorrow. Recheck labs.  PT rec snf.     LOS: 7 days    Stark Klein 01/06/2018

## 2018-01-06 NOTE — Progress Notes (Signed)
Called physical therapy to make sure they saw this patient today.  Patient did get OOB to chair with one person assist.  Able to get back to bed after one hour in chair with one person assist.  Physical therapy stated they would see the patient tomorrow, since they saw the patient yesterday.  I mentioned that Dr. [name] would like PT to see the patient today, but they said they would see the patient tomorrow.  Lissa Rowles Roselie Awkward RN

## 2018-01-07 LAB — CBC
HEMATOCRIT: 31.2 % — AB (ref 39.0–52.0)
Hemoglobin: 9.6 g/dL — ABNORMAL LOW (ref 13.0–17.0)
MCH: 29.4 pg (ref 26.0–34.0)
MCHC: 30.8 g/dL (ref 30.0–36.0)
MCV: 95.4 fL (ref 78.0–100.0)
Platelets: 158 10*3/uL (ref 150–400)
RBC: 3.27 MIL/uL — ABNORMAL LOW (ref 4.22–5.81)
RDW: 17 % — AB (ref 11.5–15.5)
WBC: 5.5 10*3/uL (ref 4.0–10.5)

## 2018-01-07 LAB — BASIC METABOLIC PANEL
ANION GAP: 9 (ref 5–15)
BUN: 21 mg/dL — AB (ref 6–20)
CALCIUM: 8.2 mg/dL — AB (ref 8.9–10.3)
CO2: 28 mmol/L (ref 22–32)
Chloride: 115 mmol/L — ABNORMAL HIGH (ref 101–111)
Creatinine, Ser: 1.31 mg/dL — ABNORMAL HIGH (ref 0.61–1.24)
GFR calc Af Amer: 57 mL/min — ABNORMAL LOW (ref >60)
GFR calc non Af Amer: 49 mL/min — ABNORMAL LOW (ref >60)
GLUCOSE: 118 mg/dL — AB (ref 65–99)
POTASSIUM: 3.1 mmol/L — AB (ref 3.5–5.1)
Sodium: 152 mmol/L — ABNORMAL HIGH (ref 135–145)

## 2018-01-07 LAB — GLUCOSE, CAPILLARY
GLUCOSE-CAPILLARY: 122 mg/dL — AB (ref 65–99)
GLUCOSE-CAPILLARY: 117 mg/dL — AB (ref 65–99)
GLUCOSE-CAPILLARY: 104 mg/dL — AB (ref 65–99)
Glucose-Capillary: 110 mg/dL — ABNORMAL HIGH (ref 65–99)
Glucose-Capillary: 105 mg/dL — ABNORMAL HIGH (ref 65–99)
Glucose-Capillary: 112 mg/dL — ABNORMAL HIGH (ref 65–99)

## 2018-01-07 LAB — ACID FAST SMEAR (AFB, MYCOBACTERIA)

## 2018-01-07 LAB — ACID FAST SMEAR (AFB): ACID FAST SMEAR - AFSCU2: NEGATIVE

## 2018-01-07 MED ORDER — BOOST / RESOURCE BREEZE PO LIQD CUSTOM
1.0000 | Freq: Two times a day (BID) | ORAL | Status: DC
  Administered 2018-01-07 – 2018-01-12 (×4): 1 via ORAL

## 2018-01-07 MED ORDER — ALBUMIN HUMAN 25 % IV SOLN
25.0000 g | Freq: Four times a day (QID) | INTRAVENOUS | Status: AC
  Administered 2018-01-07 – 2018-01-09 (×8): 25 g via INTRAVENOUS
  Filled 2018-01-07: qty 100
  Filled 2018-01-07: qty 50
  Filled 2018-01-07 (×8): qty 100

## 2018-01-07 MED ORDER — OSMOLITE 1.2 CAL PO LIQD
1000.0000 mL | ORAL | Status: DC
  Administered 2018-01-07: 1000 mL
  Filled 2018-01-07: qty 1000

## 2018-01-07 MED ORDER — PANTOPRAZOLE SODIUM 40 MG PO TBEC
40.0000 mg | DELAYED_RELEASE_TABLET | Freq: Every day | ORAL | Status: DC
  Administered 2018-01-07 – 2018-01-12 (×6): 40 mg via ORAL
  Filled 2018-01-07 (×6): qty 1

## 2018-01-07 MED ORDER — POTASSIUM CHLORIDE CRYS ER 20 MEQ PO TBCR
40.0000 meq | EXTENDED_RELEASE_TABLET | Freq: Every day | ORAL | Status: DC
  Administered 2018-01-07 – 2018-01-09 (×3): 40 meq via ORAL
  Filled 2018-01-07 (×3): qty 2

## 2018-01-07 NOTE — Clinical Social Work Note (Signed)
Clinical Social Work Assessment  Patient Details  Name: [name]. MRN: [id number] Date of Birth: 03-16-1933  Date of referral:  01/07/18               Reason for consult:  Facility Placement                Permission sought to share information with:  Family Supports, Chartered certified accountant granted to share information::  Yes, Verbal Permission Granted  Name::        Agency::  SNF  Relationship::   Spouse  Contact Information:     Housing/Transportation Living arrangements for the past 2 months:  Single Family Home Source of Information:  Patient Patient Interpreter Needed:  None Criminal Activity/Legal Involvement Pertinent to Current Situation/Hospitalization:  No - Comment as needed Significant Relationships:  Adult Children, Spouse Lives with:  Spouse Do you feel safe going back to the place where you live?  No Need for family participation in patient care:  Yes (Dependent with mobility)  Care giving concerns:   Physical therapy for short rehab at [facility]   Social Worker assessment / plan: CSW met with the patient at beside, explained role and reason visit- to assist with discharge to SNF. Patient has history of Gastric Cancer. Patient states he went to CIR a few years ago after having stents placed, and was doing rehab outpatient arranged by oncology physician. Patient reports "at home I do everything normal on my own." Patient denies having any assistance at home.  Patient reports he is agreeable to SNF placement for rehab to regain his strength.  CSW explained SNF placement process and later following up with bed offers.  -Patient has Jejunostomy tube for feedings.  FL2 to be completed, closer to d/c.   Plan: Assist w/discharge to SNF.   Employment status:  Retired Forensic scientist:  Medicare PT Recommendations:  [place] / Referral to community resources:  [place]  Patient/Family's  Response to care:  Agreeable and Responding well to care.   Patient/Family's Understanding of and Emotional Response to Diagnosis, Current Treatment, and Prognosis:  Patient receptive to the idea of rehab at [facility] and feeling ready to transition home.   Emotional Assessment Appearance:  Appears stated age Attitude/Demeanor/Rapport:    Affect (typically observed):  Accepting Orientation:  Oriented to Self, Oriented to Place, Oriented to  Time, Oriented to Situation Alcohol / Substance use:  Not Applicable Psych involvement (Current and /or in the community):  No (Comment)  Discharge Needs  Concerns to be addressed:  Discharge Planning Concerns Readmission within the last 30 days:  Yes Current discharge risk:  Dependent with Mobility Barriers to Discharge:  Continued Medical Work up   [employer], LCSW 01/07/2018, 12:11 PM

## 2018-01-07 NOTE — Progress Notes (Signed)
Pharmacy Antibiotic Note  [name]. is a 84 y.o. male admitted on 12/30/2017 with aspiration PNA.  Pharmacy has been consulted for Zosyn dosing.  Pharmacy is now consulted to add Vancomycin with GPC in BAL.  Today, 01/07/2018: D5/7 Zosyn  Plan: Continue Zosyn 3.375g IV Q8H infused over 4hrs.  Height: 6' (182.9 cm) Weight: 174 lb 6.1 oz (79.1 kg) IBW/kg (Calculated) : 77.6  Temp (24hrs), Avg:98 F (36.7 C), Min:97.8 F (36.6 C), Max:98.3 F (36.8 C)  Recent Labs  Lab 01/02/18 0333 01/02/18 1126 01/03/18 0010  01/03/18 0710 01/04/18 0449 01/05/18 0443 01/06/18 0617 01/06/18 0929 01/07/18 0545  WBC 5.8  --  6.3  --  4.6 4.8  --   --   --  5.5  CREATININE 0.87 0.79 1.21   < > 1.38* 1.37* 1.29* 1.27* 1.33* 1.31*  LATICACIDVEN  --  0.9 2.1*  --  1.6  --   --   --   --   --    < > = values in this interval not displayed.    Estimated Creatinine Clearance: 47.7 mL/min (A) (by C-G formula based on SCr of 1.31 mg/dL (H)).    Allergies  Allergen Reactions  . Niacin Hives   Antimicrobials this admission: 3/5 cefazolin pre and post-op 3/9 Zosyn >>  3/10 Vanc >> 3/11  Microbiology results:  3/5 MRSA PCR: negative 3/9 BCx: ngtd 3/9 Trach asp: normal respiratory flora 3/9 BAL: >100k normal respiratory flora 3/9 BAL: PCP neg 3/9 BAL AFB: pending 3/9 BAL fungal: pending  Thank you for allowing pharmacy to be a part of this patient's care.Minda Ditto PharmD Pager [phone] 01/07/2018, 11:00 AM

## 2018-01-07 NOTE — Progress Notes (Signed)
Pt had episode of emesis (yellow, bile-like); Surgeon notified. Orders received to change diet from soft to clear (sips until dinner) and increase tube feedings to 50 mL/hr.  Will continue to monitor.

## 2018-01-07 NOTE — Progress Notes (Signed)
Nutrition Follow-up  INTERVENTION:   - Continue Osmolite 1.2 @ 50 ml/hr (total volume of 1200 ml) provides 1440 total kcal, 67 grams protein, and 984 mL free water. - Monitor PO intake closely via calorie count to determine appropriate rate of tube feedings - D/C Ensure Enlive as pt is now on clear liquid diet - Boost Breeze po TID, each supplement provides 250 kcal and 9 grams of protein  NUTRITION DIAGNOSIS:   Increased nutrient needs related to catabolic illness, cancer and cancer related treatments, wound healing as evidenced by estimated needs.  Ongoing  GOAL:   Patient will meet greater than or equal to 90% of their needs  Progressing with tube feeding and oral intake  MONITOR:   TF tolerance, Weight trends, Labs  REASON FOR ASSESSMENT:   Consult Enteral/tube feeding initiation and management  ASSESSMENT:   84 year old male with gastric cancer who presented with an abnormal PET scan on follow-up for lymphoma (Stage IV, dx 08/2016).  He had a gastric mass seen on the PET scan and upper endoscopy confirmed gastric cancer in this location. The patient denies any issues in appetite.  He has not had any weight loss. His energy level has improved significantly since his treatment for lymphoma and is s/p chemo therapy for lymphoma. He has also seen radiation oncology. His bone marrow bx was negative for marrow involvement with lymphoma.    12/30/17 - laparoscopy, gastrectomy with PEJ placement 01/04/18 - extubated  Pt with Osmolite 1.2 infusing at 40 ml/hr at time of RD visit.  Spoke with RN who states that pt had an episode of emesis this morning. Per RN's conversation with Dr. [name] and per note, RN to increase tube feedings of Osmolite 1.2 to 50 ml/hr and put pt back on clear liquid diet. RD will d/c Ensure Enlive and order Boost Breeze.  RD initiated calorie count and discussed steps of calorie count with RN. No meal completion listed in pt's chart. RD to monitor calorie  count envelope daily.  RD noted 23 lb weight loss since last RD visit on 01/05/18. Will continue to monitor.  I/O: -1.6 L since admission UOP: 600 ml today  Medications reviewed and include: 40 mg Lasix daily, 40 mg Protonix daily, IV potassium chloride, 25 mg IV albumin every 6 hours, D5 @ 50 ml/hr (provides 204 kcal daily)  Labs reviewed: sodium 152, potassium 3.1, chloride 115, BUN 21, creatinine 1.31 CBG's: 122, 105, 110 on 01/07/18  Diet Order:  Diet clear liquid Room service appropriate? Yes; Fluid consistency: Thin  EDUCATION NEEDS:   Not appropriate for education at this time  Skin:  Skin Assessment: Skin Integrity Issues: Skin Integrity Issues:: Incisions Incisions: abdominal (3/5)  Last BM:  01/07/18 medium type 6  Height:   Ht Readings from Last 1 Encounters:  12/30/17 6' (1.829 m)    Weight:   Wt Readings from Last 1 Encounters:  01/07/18 161 lb 6 oz (73.2 kg)    Ideal Body Weight:  80.91 kg  BMI:  Body mass index is 21.89 kg/m.  Estimated Nutritional Needs:   Kcal:  2330-2500 (28-30 kcal/kg)  Protein:  125-140 grams (1.5-1.7 grams/kg)  Fluid:  >/= 2.3 L/day    Gaynell Face, MS, RD, LDN Pager: [phone] Weekend/After Hours: [phone]

## 2018-01-07 NOTE — Progress Notes (Signed)
8 Days Post-Op   Subjective/Chief Complaint: Not hungry.  Having nausea sometimes. Having loose stools. Wants to get out of the hospital.    Objective: Vital signs in last 24 hours: Temp:  [97.8 F (36.6 C)-98.3 F (36.8 C)] 98.3 F (36.8 C) (03/13 0354) Pulse Rate:  [77-88] 86 (03/13 0600) Resp:  [14-21] 19 (03/13 0600) BP: (116-162)/(47-96) 160/96 (03/13 0600) SpO2:  [95 %-100 %] 99 % (03/13 0600) Last BM Date: 01/05/18  Intake/Output from previous day: 03/12 0701 - 03/13 0700 In: 740 [P.O.:60; I.V.:190; NG/GT:300; IV Piggyback:100] Out: 2525 [Urine:2525] Intake/Output this shift: No intake/output data recorded.  General appearance: alert.  Able to converse.  Much improved.     Resp: breathing comfortably Cardio: regular rhythm, but sl tachy GI: soft, non distended, inci9sion c/d/i.    Lab Results:  Recent Labs    01/07/18 0545  WBC 5.5  HGB 9.6*  HCT 31.2*  PLT 158   BMET Recent Labs    01/06/18 0929 01/07/18 0545  NA 150* 152*  K 3.3* 3.1*  CL 115* 115*  CO2 26 28  GLUCOSE 99 118*  BUN 22* 21*  CREATININE 1.33* 1.31*  CALCIUM 8.5* 8.2*   PT/INR No results for input(s): LABPROT, INR in the last 72 hours. ABG Recent Labs    01/04/18 1007  PHART 7.407  HCO3 19.6*    Studies/Results: Dg Chest Port 1 View  Result Date: 01/05/2018 CLINICAL DATA:  Pneumonia. EXAM: PORTABLE CHEST 1 VIEW COMPARISON:  Chest x-ray dated 01/03/2018. FINDINGS: Continued improvement in aeration at the right lung base, perhaps mild residual atelectasis. Probable additional mild atelectasis at the left lung base. Heart size and mediastinal contours are stable. Endotracheal tube is been removed. Enteric tube has been removed. Left IJ central line is stable in position with tip at the level of the upper SVC. No pneumothorax seen. IMPRESSION: 1. Continued improvement in aeration at the right lung base. Probable mild residual atelectasis. Suspect additional mild atelectasis at the  left lung base. 2. No new or developing opacity to suggest pneumonia. Electronically Signed   By: Franki Cabot M.D.   On: 01/05/2018 14:12    Anti-infectives: Anti-infectives (From admission, onward)   Start     Dose/Rate Route Frequency Ordered Stop   01/05/18 1000  vancomycin (VANCOCIN) IVPB 1000 mg/200 mL premix  Status:  Discontinued     1,000 mg 200 mL/hr over 60 Minutes Intravenous Every 24 hours 01/04/18 0956 01/05/18 1319   01/04/18 1030  vancomycin (VANCOCIN) 1,500 mg in sodium chloride 0.9 % 500 mL IVPB     1,500 mg 250 mL/hr over 120 Minutes Intravenous  Once 01/04/18 0956 01/04/18 1314   01/03/18 0200  piperacillin-tazobactam (ZOSYN) IVPB 3.375 g     3.375 g 12.5 mL/hr over 240 Minutes Intravenous Every 8 hours 01/03/18 0112     12/30/17 1714  ceFAZolin (ANCEF) 2-4 GM/100ML-% IVPB    Comments:  Otelia Sergeant   : cabinet override      12/30/17 1714 12/31/17 0529   12/30/17 1600  ceFAZolin (ANCEF) IVPB 2g/100 mL premix     2 g 200 mL/hr over 30 Minutes Intravenous Every 8 hours 12/30/17 1356 12/30/17 1747   12/30/17 0545  ceFAZolin (ANCEF) 2-4 GM/100ML-% IVPB    Comments:  Waldron Session   : cabinet override      12/30/17 0545 12/30/17 0756   12/30/17 0542  ceFAZolin (ANCEF) IVPB 2g/100 mL premix     2 g 200 mL/hr over  30 Minutes Intravenous On call to O.R. 12/30/17 0542 12/30/17 4536      Assessment/Plan: s/p Procedure(s) with comments: DIAGNOSTIC LAPAROSCOPY WITH OPEN DISTAL GASTRECTOMY AND PLACEMENT JEJUNOSTOMY FEEDING TUBE (N/A) - EPIDURAL Increase tube feeds Calorie counts Transfer to floor.  Control nausea. Work toward discharge. PT. Hypernatremia and hypokalemia. Replete K and increase fluid given. Add 2 days of albumin boluses.     LOS: 8 days    Stark Klein 01/07/2018

## 2018-01-08 ENCOUNTER — Inpatient Hospital Stay (HOSPITAL_COMMUNITY): Payer: Medicare Other

## 2018-01-08 DIAGNOSIS — R112 Nausea with vomiting, unspecified: Secondary | ICD-10-CM

## 2018-01-08 LAB — CBC
HCT: 28.9 % — ABNORMAL LOW (ref 39.0–52.0)
Hemoglobin: 9.1 g/dL — ABNORMAL LOW (ref 13.0–17.0)
MCH: 30.1 pg (ref 26.0–34.0)
MCHC: 31.5 g/dL (ref 30.0–36.0)
MCV: 95.7 fL (ref 78.0–100.0)
PLATELETS: 170 10*3/uL (ref 150–400)
RBC: 3.02 MIL/uL — AB (ref 4.22–5.81)
RDW: 17.1 % — ABNORMAL HIGH (ref 11.5–15.5)
WBC: 4.8 10*3/uL (ref 4.0–10.5)

## 2018-01-08 LAB — BASIC METABOLIC PANEL
Anion gap: 11 (ref 5–15)
BUN: 18 mg/dL (ref 6–20)
CHLORIDE: 114 mmol/L — AB (ref 101–111)
CO2: 25 mmol/L (ref 22–32)
Calcium: 8.4 mg/dL — ABNORMAL LOW (ref 8.9–10.3)
Creatinine, Ser: 1.13 mg/dL (ref 0.61–1.24)
GFR calc Af Amer: >60 mL/min (ref >60)
GFR, EST NON AFRICAN AMERICAN: 59 mL/min — AB (ref >60)
GLUCOSE: 119 mg/dL — AB (ref 65–99)
POTASSIUM: 3.2 mmol/L — AB (ref 3.5–5.1)
Sodium: 150 mmol/L — ABNORMAL HIGH (ref 135–145)

## 2018-01-08 LAB — CULTURE, BLOOD (ROUTINE X 2)
CULTURE: NO GROWTH
CULTURE: NO GROWTH
SPECIAL REQUESTS: ADEQUATE
SPECIAL REQUESTS: ADEQUATE

## 2018-01-08 LAB — GLUCOSE, CAPILLARY
GLUCOSE-CAPILLARY: 111 mg/dL — AB (ref 65–99)
GLUCOSE-CAPILLARY: 114 mg/dL — AB (ref 65–99)
GLUCOSE-CAPILLARY: 101 mg/dL — AB (ref 65–99)
GLUCOSE-CAPILLARY: 88 mg/dL (ref 65–99)
Glucose-Capillary: 107 mg/dL — ABNORMAL HIGH (ref 65–99)
Glucose-Capillary: 91 mg/dL (ref 65–99)

## 2018-01-08 LAB — URINALYSIS, ROUTINE W REFLEX MICROSCOPIC
Bilirubin Urine: NEGATIVE
GLUCOSE, UA: NEGATIVE mg/dL
Hgb urine dipstick: NEGATIVE
Ketones, ur: NEGATIVE mg/dL
LEUKOCYTES UA: NEGATIVE
Nitrite: NEGATIVE
PROTEIN: NEGATIVE mg/dL
SPECIFIC GRAVITY, URINE: 1.009 (ref 1.005–1.030)
pH: 7 (ref 5.0–8.0)

## 2018-01-08 MED ORDER — IOPAMIDOL (ISOVUE-300) INJECTION 61%: 100.0000 mL | Freq: Once | INTRAVENOUS | Status: DC | PRN

## 2018-01-08 MED ORDER — IOPAMIDOL (ISOVUE-300) INJECTION 61%
INTRAVENOUS | Status: AC
  Administered 2018-01-08: 100 mL via ORAL
  Filled 2018-01-08: qty 100

## 2018-01-08 MED ORDER — OSMOLITE 1.2 CAL PO LIQD
1000.0000 mL | ORAL | Status: DC
  Filled 2018-01-08 (×2): qty 1000

## 2018-01-08 MED ORDER — OSMOLITE 1.2 CAL PO LIQD
1000.0000 mL | ORAL | Status: DC
  Administered 2018-01-08: 1000 mL
  Filled 2018-01-08 (×2): qty 1000

## 2018-01-08 NOTE — NC FL2 (Addendum)
[place] LEVEL OF CARE SCREENING TOOL     IDENTIFICATION  Patient Name: [name]. Birthdate: 1933/07/07 Sex: male Admission Date (Current Location): 12/30/2017  [facility] and [place] Number:  Herbalist and Address:  [facility],  [place] [address], [place]      Provider Number: [id number]  Attending Physician Name and Address:  Stark Klein, MD  Relative Name and Phone Number:       Current Level of Care: Hospital Recommended Level of Care: [place] Prior Approval Number:    Date Approved/Denied:   PASRR Number: [id number]  Discharge Plan: SNF    Current Diagnoses: Patient Active Problem List   Diagnosis Date Noted  . HCAP (healthcare-associated pneumonia)   . Acute respiratory failure ([place])   . Gastric cancer ([place]) 12/30/2017  . Malignant neoplasm of body of stomach ([place]) 12/04/2017  . Counseling regarding advanced care planning and goals of care 08/07/2017  . Drug-induced neutropenia ([place]) 06/26/2017  . Protein-calorie malnutrition, severe ([place])   . SOB (shortness of breath)   . Hypokalemia 03/17/2017  . Hypernatremia 03/17/2017  . AKI (acute kidney injury) ([place]) 03/17/2017  . Dehydration 03/17/2017  . Thrush, oral 03/17/2017  . Mantle cell lymphoma of lymph nodes of multiple regions ([place]) 02/24/2017  . 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 GI bleed 02/03/2017  . Lower GI bleed 02/03/2017  . Coronary artery disease   . IFG (impaired fasting glucose) 10/03/2015  . PMR (polymyalgia rheumatica) ([facility]) 09/07/2015  . Orthostatic hypotension 08/24/2015  . Viral syndrome 08/24/2015  . GERD (gastroesophageal reflux disease) 03/24/2014  . ACP (advance care planning) 11/02/2013  . Left upper arm pain 09/15/2012  . Routine health maintenance 10/29/2011  . HEARING LOSS 11/05/2010  . Chest pain 11/06/2009  . SKIN CANCER, RECURRENT 10/13/2007  .  Hyperlipidemia 10/13/2007  . Gout 10/13/2007  . Essential hypertension 10/13/2007  . PVC (premature ventricular contraction) 10/13/2007  . PSORIASIS 10/13/2007  . OSTEOARTHRITIS, ANKLE, RIGHT 10/13/2007  . Other acquired absence of organ 10/13/2007    Orientation RESPIRATION BLADDER Height & Weight     Self,  Place  Normal Continent Weight: 179 lb 3.7 oz (81.3 kg) Height:  6' (182.9 cm)  BEHAVIORAL SYMPTOMS/MOOD NEUROLOGICAL BOWEL NUTRITION STATUS      Continent Diet, (JEJUNOSTOMY FEEDING TUBE) (See D/C Summary for rate of tube feedings)   AMBULATORY STATUS COMMUNICATION OF NEEDS Skin   Extensive Assist Verbally Normal                       Personal Care Assistance Level of Assistance  Bathing, Feeding, Dressing Bathing Assistance: Limited assistance Feeding assistance: Independent Dressing Assistance: Limited assistance     Functional Limitations Info  Sight, Hearing, Speech Sight Info: Adequate Hearing Info: Adequate Speech Info: Adequate    SPECIAL CARE FACTORS FREQUENCY  PT (By licensed PT), OT (By licensed OT)     PT Frequency: 5x/week OT Frequency: 5x/week            Contractures Contractures Info: Not present    Additional Factors Info  Code Status, Allergies Code Status Info: Fullcode Allergies Info: Allergies: Niacin           Current Medications (01/08/2018):  This is the current hospital active medication list Current Facility-Administered Medications  Medication Dose Route Frequency Provider Last Rate Last Dose  . albumin human 25 % solution 25 g  25 g  Intravenous Q6H Stark Klein, MD 60 mL/hr at 01/08/18 0600 25 g at 01/08/18 0600  . aspirin EC tablet 81 mg  81 mg Oral Daily Mannam, Praveen, MD   81 mg at 01/07/18 1328  . carvedilol (COREG) tablet 6.25 mg  6.25 mg Per Tube BID WC Minus Breeding, MD   6.25 mg at 01/08/18 0837  . Chlorhexidine Gluconate Cloth 2 % PADS 6 each  6 each Topical Daily Marshell Garfinkel, MD   6 each at 01/07/18  0856  . dextrose 5 % and 0.45 % NaCl with KCl 20 mEq/L infusion   Intravenous Continuous Stark Klein, MD 50 mL/hr at 01/08/18 1100    . diphenhydrAMINE (BENADRYL) 12.5 MG/5ML elixir 12.5 mg  12.5 mg Oral Q6H PRN Ogan, Okoronkwo U, MD      . enoxaparin (LOVENOX) injection 40 mg  40 mg Subcutaneous Q24H Shade, Christine E, RPH   40 mg at 01/08/18 1100  . feeding supplement (BOOST / RESOURCE BREEZE) liquid 1 Container  1 Container Oral BID BM Stark Klein, MD   1 Container at 01/07/18 1537  . feeding supplement (OSMOLITE 1.2 CAL) liquid 1,000 mL  1,000 mL Per Tube Continuous Stark Klein, MD      . furosemide (LASIX) tablet 40 mg  40 mg Oral Daily Stark Klein, MD   40 mg at 01/07/18 1137  . haloperidol lactate (HALDOL) injection 1 mg  1 mg Intravenous Q6H PRN Stark Klein, MD   1 mg at 01/03/18 2200  . hydrALAZINE (APRESOLINE) injection 5 mg  5 mg Intravenous Q6H PRN Erlene Quan, PA-C   5 mg at 01/07/18 2025  . iopamidol (ISOVUE-300) 61 % injection           . nitroGLYCERIN (NITROSTAT) SL tablet 0.4 mg  0.4 mg Sublingual Q5 min PRN Stark Klein, MD      . ondansetron (ZOFRAN-ODT) disintegrating tablet 4 mg  4 mg Oral Q4H PRN Stark Klein, MD       Or  . ondansetron (ZOFRAN) injection 4 mg  4 mg Intravenous Q4H PRN Stark Klein, MD   4 mg at 01/08/18 1100  . pantoprazole (PROTONIX) EC tablet 40 mg  40 mg Oral Q1200 Stark Klein, MD   40 mg at 01/07/18 0927  . piperacillin-tazobactam (ZOSYN) IVPB 3.375 g  3.375 g Intravenous Q8H Glogovac, Nikola, RPH 12.5 mL/hr at 01/08/18 1100 3.375 g at 01/08/18 1100  . potassium chloride SA (K-DUR,KLOR-CON) CR tablet 40 mEq  40 mEq Oral Daily Stark Klein, MD   40 mEq at 01/07/18 0926  . prochlorperazine (COMPAZINE) injection 10 mg  10 mg Intravenous Q6H PRN Stark Klein, MD   10 mg at 01/07/18 1534  . sodium chloride flush (NS) 0.9 % injection 10-40 mL  10-40 mL Intracatheter Q12H Mannam, Praveen, MD   10 mL at 01/07/18 0848  . sodium chloride flush  (NS) 0.9 % injection 10-40 mL  10-40 mL Intracatheter PRN Mannam, Hart Robinsons, MD         Discharge Medications: Please see discharge summary for a list of discharge medications.  Relevant Imaging Results:  Relevant Lab Results:   Additional Information ssn:[id number]  Lia Hopping, LCSW
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
## 2018-01-08 NOTE — Progress Notes (Signed)
Calorie Count Note  48 hour calorie count ordered on 3/13. At 1400 on 3/13, pt's diet was downgraded to clears. Calorie Count will be re-initiated once diet is advanced. Pt will not meet needs on clear liquid diet. Pt was not drinking Boost Breeze d/t reported nausea. Early this morning it was documented pt consumed 112 ml of Coke and 50 ml of jello.  Per orders, noted that TF is beng advanced by 10 ml every 12 hours per MD to a goal rate of 80 ml/hr. This goal rate will provide 2304 kcal and 106 g protein.  Will continue to monitor.  Clayton Bibles, MS, RD, [place] Dietitian Pager: [phone] After Hours Pager: [phone]

## 2018-01-08 NOTE — Care Management Important Message (Signed)
Important Message  Patient Details  Name: [name]. MRN: [id number] Date of Birth: [birth year]   Medicare Important Message Given:  Yes    Kerin Salen 01/08/2018, 10:38 AMImportant Message  Patient Details  Name: [name]. MRN: [id number] Date of Birth: Aug 31, 1933   Medicare Important Message Given:  Yes    Kerin Salen 01/08/2018, 10:37 AM

## 2018-01-08 NOTE — Progress Notes (Signed)
Physical Therapy Treatment Patient Details Name: Sade Mehlhoff. MRN: [id number] DOB: 01-Aug-1933 Today's Date: 01/08/2018    History of Present Illness 84 yo male admitted 12/30/17 with recurrent gastric cancer. S/P DIAGNOSTIC LAPAROSCOPY WITH OPEN DISTAL GASTRECTOMY AND PLACEMENT JEJUNOSTOMY FEEDING TUBE (N/A) - EPIDURAL Required Intubation for ;ow BP, increased confusion over weekend.    PT Comments    Assisted OOB to amb in hallway with walker.  Assisted to bathroom then back to bed.   Follow Up Recommendations  SNF     Equipment Recommendations  None recommended by PT    Recommendations for Other Services       Precautions / Restrictions Precautions Precautions: Fall Precaution Comments: PEG Restrictions Weight Bearing Restrictions: No    Mobility  Bed Mobility Overal bed mobility: Needs Assistance Bed Mobility: Supine to Sit;Sit to Supine     Supine to sit: Supervision Sit to supine: Supervision   General bed mobility comments: able self with increased time  Transfers Overall transfer level: Needs assistance Equipment used: Rolling walker (2 wheeled) Transfers: Sit to/from [facility] Sit to Stand: Supervision;Min guard Stand pivot transfers: Supervision;Min guard       General transfer comment: able with <25% VC's on safety   also assisted in bathroom with toilet transfer and washing hands at sink  Ambulation/Gait Ambulation/Gait assistance: Supervision;Min guard Ambulation Distance (Feet): 220 Feet Assistive device: Rolling walker (2 wheeled) Gait Pattern/deviations: Step-through pattern;Decreased stride length;Trunk flexed Gait velocity: decreased   General Gait Details: slight unsteady gait requiring walker for safety.  Tolerated an increased distance.  Fatigues quickly.     Stairs            Wheelchair Mobility    Modified Rankin (Stroke Patients Only)       Balance                                             Cognition Arousal/Alertness: Awake/alert Behavior During Therapy: WFL for tasks assessed/performed                                   General Comments: slow, sluggish but following all commands  wanted to know what time [place] was playing      Exercises      General Comments        Pertinent Vitals/Pain Pain Assessment: No/denies pain    Home Living                      Prior Function            PT Goals (current goals can now be found in the care plan section) Progress towards PT goals: Progressing toward goals    Frequency    Min 2X/week      PT Plan Current plan remains appropriate    Co-evaluation              AM-PAC PT "6 Clicks" Daily Activity  Outcome Measure  Difficulty turning over in bed (including adjusting bedclothes, sheets and blankets)?: A Little Difficulty moving from lying on back to sitting on the side of the bed? : A Little Difficulty sitting down on and standing up from a chair with arms (e.g., wheelchair, bedside commode, etc,.)?: A Little Help needed moving to and from a bed  to chair (including a wheelchair)?: A Little Help needed walking in hospital room?: A Little Help needed climbing 3-5 steps with a railing? : A Little 6 Click Score: 18    End of Session Equipment Utilized During Treatment: Gait belt Activity Tolerance: Patient tolerated treatment well Patient left: in bed;with call bell/phone within reach;with bed alarm set Nurse Communication: Mobility status PT Visit Diagnosis: Unsteadiness on feet (R26.81)     Time: 1500-1530 PT Time Calculation (min) (ACUTE ONLY): 30 min  Charges:  $Gait Training: 8-22 mins $Therapeutic Activity: 8-22 mins                    G Codes:       Rica Koyanagi  PTA [facility]  Acute  Rehab Pager      [phone]

## 2018-01-08 NOTE — Progress Notes (Signed)
Dr. [name] aware via phone pt had vomiting episode of large amount this am. Emesis was dark. Antiemetic given. Nor residual noted. TF stopped shortly after episode and pt transported to radiology for UGI swallow study. Doing well upon arrival back to room from UGI. Clarification order received regarding appropriate rate of Osmolite 1.2 at this time. Md ordered to keep rate at 50 ml/hr at this time.

## 2018-01-08 NOTE — Progress Notes (Signed)
9 Days Post-Op   Subjective/Chief Complaint: Pt threw up again.  Is continuing to have bowel function.  Is a little confused again.    Objective: Vital signs in last 24 hours: Temp:  [98 F (36.7 C)-99.1 F (37.3 C)] 98.1 F (36.7 C) (03/14 1250) Pulse Rate:  [68-90] 68 (03/14 1250) Resp:  [18-21] 18 (03/14 1250) BP: (143-182)/(53-77) 165/65 (03/14 1250) SpO2:  [96 %-100 %] 97 % (03/14 1250) Weight:  [81.3 kg (179 lb 3.7 oz)] 81.3 kg (179 lb 3.7 oz) (03/14 0500) Last BM Date: 01/07/18  Intake/Output from previous day: 03/13 0701 - 03/14 0700 In: 1512 [P.O.:162; I.V.:550; NG/GT:550; IV Piggyback:250] Out: 0938 [Urine:1775] Intake/Output this shift: Total I/O In: 120 [P.O.:120] Out: 400 [Urine:400]  General appearance: alert.  Seems a little less oriented to situation today.     Resp: breathing comfortably Cardio: regular rhythm, regular rate GI: soft, non distended, incision c/d/i.    Lab Results:  Recent Labs    01/07/18 0545 01/08/18 0502  WBC 5.5 4.8  HGB 9.6* 9.1*  HCT 31.2* 28.9*  PLT 158 170   BMET Recent Labs    01/07/18 0545 01/08/18 0502  NA 152* 150*  K 3.1* 3.2*  CL 115* 114*  CO2 28 25  GLUCOSE 118* 119*  BUN 21* 18  CREATININE 1.31* 1.13  CALCIUM 8.2* 8.4*   PT/INR No results for input(s): LABPROT, INR in the last 72 hours. ABG No results for input(s): PHART, HCO3 in the last 72 hours.  Invalid input(s): PCO2, PO2  Studies/Results: Dg Chest Port 1 View  Result Date: 01/08/2018 CLINICAL DATA:  Status post bronchoscopy EXAM: PORTABLE CHEST 1 VIEW COMPARISON:  Three days ago FINDINGS: Improved aeration in the lower lungs with better diaphragm visualization. There is no edema, consolidation, effusion, or pneumothorax. Chronic cardiomegaly and aortic tortuosity. Left IJ catheter has been removed. IMPRESSION: Continued improvement in lower lung aeration. Electronically Signed   By: Monte Fantasia M.D.   On: 01/08/2018 09:46   Dg Ugi W/high  Density W/kub  Result Date: 01/08/2018 CLINICAL DATA:  Postop day 9 partial gastrectomy for cancer. Nausea. Vomiting. EXAM: WATER SOLUBLE UPPER GI SERIES TECHNIQUE: Single-column upper GI series was performed using water soluble contrast. CONTRAST:  100 mL Isovue-300 orally COMPARISON:  12/30/2017, CT 12/16/2017 FLUOROSCOPY TIME:  Fluoroscopy Time:  3 minutes 0 seconds Radiation Exposure Index (if provided by the fluoroscopic device): Number of Acquired Spot Images: 0 FINDINGS: Preliminary KUB demonstrates normal bowel gas pattern. Jejunostomy feeding tube is present in the left abdomen. Decreased esophageal motility. Mild esophageal dilatation. Contrast passed into the stomach which has been partially resected. Gastric jejunostomy is patent. No leak. No obstruction of the stomach. Jejunum is nondilated. Mild gastroesophageal reflux IMPRESSION: Postop partial gastrectomy with gastrojejunostomy. No leak or obstruction identified Decreased esophageal peristalsis.  Mild gastroesophageal reflux. Electronically Signed   By: Franchot Gallo M.D.   On: 01/08/2018 13:12    Anti-infectives: Anti-infectives (From admission, onward)   Start     Dose/Rate Route Frequency Ordered Stop   01/05/18 1000  vancomycin (VANCOCIN) IVPB 1000 mg/200 mL premix  Status:  Discontinued     1,000 mg 200 mL/hr over 60 Minutes Intravenous Every 24 hours 01/04/18 0956 01/05/18 1319   01/04/18 1030  vancomycin (VANCOCIN) 1,500 mg in sodium chloride 0.9 % 500 mL IVPB     1,500 mg 250 mL/hr over 120 Minutes Intravenous  Once 01/04/18 0956 01/04/18 1314   01/03/18 0200  piperacillin-tazobactam (ZOSYN) IVPB 3.375  g     3.375 g 12.5 mL/hr over 240 Minutes Intravenous Every 8 hours 01/03/18 0112     12/30/17 1714  ceFAZolin (ANCEF) 2-4 GM/100ML-% IVPB    Comments:  Otelia Sergeant   : cabinet override      12/30/17 1714 12/31/17 0529   12/30/17 1600  ceFAZolin (ANCEF) IVPB 2g/100 mL premix     2 g 200 mL/hr over 30 Minutes  Intravenous Every 8 hours 12/30/17 1356 12/30/17 1747   12/30/17 0545  ceFAZolin (ANCEF) 2-4 GM/100ML-% IVPB    Comments:  Waldron Session   : cabinet override      12/30/17 0545 12/30/17 0756   12/30/17 0542  ceFAZolin (ANCEF) IVPB 2g/100 mL premix     2 g 200 mL/hr over 30 Minutes Intravenous On call to O.R. 12/30/17 4356 12/30/17 8616      Assessment/Plan: s/p Procedure(s) with comments: DIAGNOSTIC LAPAROSCOPY WITH OPEN DISTAL GASTRECTOMY AND PLACEMENT JEJUNOSTOMY FEEDING TUBE (N/A) - EPIDURAL  Continue tube feeds and increase to goal.   Ok to hold ORAL feeds.   Tube feeds are distal to stomach via jejunal feeding tube.  Will get upper GI to evaluate gastric emptying. Will also repeat CXR to evaluate for recurrent aspiration. Ordering UA as well as potential cause for confusion/delirium. Off narcotics since last week.   IJ catheter removed.  Dispo depends on mental status and toleration of tube feeds.  He can get all calories via tube feeds even if nauseated and not tolerating oral nutrition.   Have also asked [facility] to evaluate.   LOS: 9 days    Stark Klein 01/08/2018

## 2018-01-09 ENCOUNTER — Telehealth: Payer: Self-pay | Admitting: Hematology

## 2018-01-09 DIAGNOSIS — G9341 Metabolic encephalopathy: Secondary | ICD-10-CM

## 2018-01-09 LAB — CBC
HCT: 28.6 % — ABNORMAL LOW (ref 39.0–52.0)
HEMOGLOBIN: 8.8 g/dL — AB (ref 13.0–17.0)
MCH: 29.7 pg (ref 26.0–34.0)
MCHC: 30.8 g/dL (ref 30.0–36.0)
MCV: 96.6 fL (ref 78.0–100.0)
Platelets: 181 10*3/uL (ref 150–400)
RBC: 2.96 MIL/uL — AB (ref 4.22–5.81)
RDW: 17.1 % — ABNORMAL HIGH (ref 11.5–15.5)
WBC: 4.1 10*3/uL (ref 4.0–10.5)

## 2018-01-09 LAB — BASIC METABOLIC PANEL
ANION GAP: 12 (ref 5–15)
BUN: 25 mg/dL — ABNORMAL HIGH (ref 6–20)
CHLORIDE: 114 mmol/L — AB (ref 101–111)
CO2: 28 mmol/L (ref 22–32)
Calcium: 9.2 mg/dL (ref 8.9–10.3)
Creatinine, Ser: 1.21 mg/dL (ref 0.61–1.24)
GFR calc Af Amer: >60 mL/min (ref >60)
GFR calc non Af Amer: 54 mL/min — ABNORMAL LOW (ref >60)
Glucose, Bld: 113 mg/dL — ABNORMAL HIGH (ref 65–99)
POTASSIUM: 3.4 mmol/L — AB (ref 3.5–5.1)
SODIUM: 154 mmol/L — AB (ref 135–145)

## 2018-01-09 LAB — IRON AND TIBC
IRON: 61 ug/dL (ref 45–182)
Saturation Ratios: 36 % (ref 17.9–39.5)
TIBC: 169 ug/dL — ABNORMAL LOW (ref 250–450)
UIBC: 108 ug/dL

## 2018-01-09 LAB — RETICULOCYTES
RBC.: 2.96 MIL/uL — ABNORMAL LOW (ref 4.22–5.81)
RETIC COUNT ABSOLUTE: 14.8 10*3/uL — AB (ref 19.0–186.0)
Retic Ct Pct: 0.5 % (ref 0.4–3.1)

## 2018-01-09 LAB — GLUCOSE, CAPILLARY
GLUCOSE-CAPILLARY: 102 mg/dL — AB (ref 65–99)
GLUCOSE-CAPILLARY: 114 mg/dL — AB (ref 65–99)
GLUCOSE-CAPILLARY: 116 mg/dL — AB (ref 65–99)
Glucose-Capillary: 94 mg/dL (ref 65–99)
Glucose-Capillary: 102 mg/dL — ABNORMAL HIGH (ref 65–99)

## 2018-01-09 LAB — FERRITIN: Ferritin: 55 ng/mL (ref 24–336)

## 2018-01-09 LAB — TSH: TSH: 2.854 u[IU]/mL (ref 0.350–4.500)

## 2018-01-09 LAB — FOLATE: FOLATE: 32 ng/mL (ref >5.9)

## 2018-01-09 LAB — VITAMIN B12: Vitamin B-12: 927 pg/mL — ABNORMAL HIGH (ref 180–914)

## 2018-01-09 MED ORDER — FREE WATER
250.0000 mL | Freq: Three times a day (TID) | Status: DC
  Administered 2018-01-09 – 2018-01-13 (×11): 250 mL

## 2018-01-09 MED ORDER — OSMOLITE 1.2 CAL PO LIQD
1000.0000 mL | ORAL | Status: DC
  Administered 2018-01-09 – 2018-01-15 (×10): 1000 mL
  Filled 2018-01-09 (×12): qty 1000

## 2018-01-09 MED ORDER — ENOXAPARIN SODIUM 40 MG/0.4ML ~~LOC~~ SOLN
40.0000 mg | SUBCUTANEOUS | Status: DC
  Administered 2018-01-11 – 2018-01-12 (×2): 40 mg via SUBCUTANEOUS
  Filled 2018-01-09 (×2): qty 0.4

## 2018-01-09 NOTE — Progress Notes (Signed)
Paged on call about small amount of blood in patients stools x2 this am and BP 156/56, other VSS. Patient steady with ambulation, no pain.

## 2018-01-09 NOTE — Telephone Encounter (Signed)
Patients wife called to cancel appointment. Patient is in the hospital.

## 2018-01-09 NOTE — Progress Notes (Signed)
Physical Therapy Treatment Patient Details Name: Thane Age. MRN: [id number] DOB: Oct 12, 1933 Today's Date: 01/09/2018    History of Present Illness 84 yo male admitted 12/30/17 with recurrent gastric cancer. S/P DIAGNOSTIC LAPAROSCOPY WITH OPEN DISTAL GASTRECTOMY AND PLACEMENT JEJUNOSTOMY FEEDING TUBE (N/A) - EPIDURAL Required Intubation for ;ow BP, increased confusion over weekend.    PT Comments    Progressing with mobility. Still some confusion/memory issues noted at times.  Pt reported an episode of vomiting at start of session-made Rn aware.   Follow Up Recommendations  SNF     Equipment Recommendations  None recommended by PT    Recommendations for Other Services       Precautions / Restrictions Precautions Precautions: Fall Precaution Comments: PEG Restrictions Weight Bearing Restrictions: No    Mobility  Bed Mobility               General bed mobility comments: sitting EOB  Transfers Overall transfer level: Needs assistance Equipment used: Rolling walker (2 wheeled) Transfers: Sit to/from Stand Sit to Stand: Min guard         General transfer comment: close guard for safety. VCs hand placement  Ambulation/Gait Ambulation/Gait assistance: Min guard Ambulation Distance (Feet): 500 Feet Assistive device: Rolling walker (2 wheeled) Gait Pattern/deviations: Step-through pattern     General Gait Details: mildly unsteady during ambulation-still requires walker.    Stairs            Wheelchair Mobility    Modified Rankin (Stroke Patients Only)       Balance Overall balance assessment: Needs assistance         Standing balance support: Bilateral upper extremity supported Standing balance-Leahy Scale: Poor                              Cognition Arousal/Alertness: Awake/alert Behavior During Therapy: WFL for tasks assessed/performed Overall Cognitive Status: Impaired/Different from baseline Area of Impairment:  Memory                               General Comments: some short term memory issues      Exercises      General Comments        Pertinent Vitals/Pain Pain Assessment: No/denies pain    Home Living                      Prior Function            PT Goals (current goals can now be found in the care plan section) Progress towards PT goals: Progressing toward goals    Frequency    Min 2X/week      PT Plan Current plan remains appropriate    Co-evaluation              AM-PAC PT "6 Clicks" Daily Activity  Outcome Measure  Difficulty turning over in bed (including adjusting bedclothes, sheets and blankets)?: A Little Difficulty moving from lying on back to sitting on the side of the bed? : A Little Difficulty sitting down on and standing up from a chair with arms (e.g., wheelchair, bedside commode, etc,.)?: A Little Help needed moving to and from a bed to chair (including a wheelchair)?: A Little Help needed walking in hospital room?: A Little Help needed climbing 3-5 steps with a railing? : A Little 6 Click Score: 18    End  of Session   Activity Tolerance: Patient tolerated treatment well Patient left: in chair;with call bell/phone within reach;with chair alarm set   PT Visit Diagnosis: Unsteadiness on feet (R26.81);Muscle weakness (generalized) (M62.81)     Time: 3202-3343 PT Time Calculation (min) (ACUTE ONLY): 20 min  Charges:  $Gait Training: 8-22 mins                    G Codes:          Weston Anna, MPT Pager: [phone]

## 2018-01-09 NOTE — Progress Notes (Signed)
10 Days Post-Op   Subjective/Chief Complaint: Less confused.    Objective: Vital signs in last 24 hours: Temp:  [97.9 F (36.6 C)-98.8 F (37.1 C)] 98.8 F (37.1 C) (03/15 0503) Pulse Rate:  [68-82] 82 (03/15 0503) Resp:  [16-18] 16 (03/15 0503) BP: (151-165)/(56-68) 156/56 (03/15 0503) SpO2:  [97 %-99 %] 99 % (03/15 0503) Last BM Date: 01/08/18  Intake/Output from previous day: 03/14 0701 - 03/15 0700 In: 1645 [P.O.:120; I.V.:1125; IV Piggyback:400] Out: 1400 [Urine:1400] Intake/Output this shift: No intake/output data recorded.  General appearance: alert and oriented.  Vomiting transparent bile.  No tube feeds.      Resp: breathing comfortably Cardio: regular rhythm, regular rate GI: soft, non distended, incision c/d/i.    Lab Results:  Recent Labs    01/08/18 0502 01/09/18 0606  WBC 4.8 4.1  HGB 9.1* 8.8*  HCT 28.9* 28.6*  PLT 170 181   BMET Recent Labs    01/08/18 0502 01/09/18 0606  NA 150* 154*  K 3.2* 3.4*  CL 114* 114*  CO2 25 28  GLUCOSE 119* 113*  BUN 18 25*  CREATININE 1.13 1.21  CALCIUM 8.4* 9.2   PT/INR No results for input(s): LABPROT, INR in the last 72 hours. ABG No results for input(s): PHART, HCO3 in the last 72 hours.  Invalid input(s): PCO2, PO2  Studies/Results: Dg Chest Port 1 View  Result Date: 01/08/2018 CLINICAL DATA:  Status post bronchoscopy EXAM: PORTABLE CHEST 1 VIEW COMPARISON:  Three days ago FINDINGS: Improved aeration in the lower lungs with better diaphragm visualization. There is no edema, consolidation, effusion, or pneumothorax. Chronic cardiomegaly and aortic tortuosity. Left IJ catheter has been removed. IMPRESSION: Continued improvement in lower lung aeration. Electronically Signed   By: Monte Fantasia M.D.   On: 01/08/2018 09:46   Dg Ugi W/high Density W/kub  Result Date: 01/08/2018 CLINICAL DATA:  Postop day 9 partial gastrectomy for cancer. Nausea. Vomiting. EXAM: WATER SOLUBLE UPPER GI SERIES TECHNIQUE:  Single-column upper GI series was performed using water soluble contrast. CONTRAST:  100 mL Isovue-300 orally COMPARISON:  12/30/2017, CT 12/16/2017 FLUOROSCOPY TIME:  Fluoroscopy Time:  3 minutes 0 seconds Radiation Exposure Index (if provided by the fluoroscopic device): Number of Acquired Spot Images: 0 FINDINGS: Preliminary KUB demonstrates normal bowel gas pattern. Jejunostomy feeding tube is present in the left abdomen. Decreased esophageal motility. Mild esophageal dilatation. Contrast passed into the stomach which has been partially resected. Gastric jejunostomy is patent. No leak. No obstruction of the stomach. Jejunum is nondilated. Mild gastroesophageal reflux IMPRESSION: Postop partial gastrectomy with gastrojejunostomy. No leak or obstruction identified Decreased esophageal peristalsis.  Mild gastroesophageal reflux. Electronically Signed   By: Franchot Gallo M.D.   On: 01/08/2018 13:12    Anti-infectives: Anti-infectives (From admission, onward)   Start     Dose/Rate Route Frequency Ordered Stop   01/05/18 1000  vancomycin (VANCOCIN) IVPB 1000 mg/200 mL premix  Status:  Discontinued     1,000 mg 200 mL/hr over 60 Minutes Intravenous Every 24 hours 01/04/18 0956 01/05/18 1319   01/04/18 1030  vancomycin (VANCOCIN) 1,500 mg in sodium chloride 0.9 % 500 mL IVPB     1,500 mg 250 mL/hr over 120 Minutes Intravenous  Once 01/04/18 0956 01/04/18 1314   01/03/18 0200  piperacillin-tazobactam (ZOSYN) IVPB 3.375 g     3.375 g 12.5 mL/hr over 240 Minutes Intravenous Every 8 hours 01/03/18 0112     12/30/17 1714  ceFAZolin (ANCEF) 2-4 GM/100ML-% IVPB    Comments:  Algis Downs, Romelle Starcher   : cabinet override      12/30/17 1714 12/31/17 0529   12/30/17 1600  ceFAZolin (ANCEF) IVPB 2g/100 mL premix     2 g 200 mL/hr over 30 Minutes Intravenous Every 8 hours 12/30/17 1356 12/30/17 1747   12/30/17 0545  ceFAZolin (ANCEF) 2-4 GM/100ML-% IVPB    Comments:  Waldron Session   : cabinet override      12/30/17  0545 12/30/17 0756   12/30/17 0542  ceFAZolin (ANCEF) IVPB 2g/100 mL premix     2 g 200 mL/hr over 30 Minutes Intravenous On call to O.R. 12/30/17 4270 12/30/17 6237      Assessment/Plan: s/p Procedure(s) with comments: DIAGNOSTIC LAPAROSCOPY WITH OPEN DISTAL GASTRECTOMY AND PLACEMENT JEJUNOSTOMY FEEDING TUBE (N/A) - EPIDURAL  Continue tube feeds and increase to goal.  ** have discussed with nursing and clarified orders again.   Ok to hold ORAL feeds.   Tube feeds are distal to stomach via jejunal feeding tube.  Upper GI showed emptying of stomach, no outflow obstruction, but sl poor esophageal motility.   CXR improved.   U/A ok.   TRH assisting with electrolyte abnormalities.     IJ catheter removed.  Dispo depends on mental status and toleration of tube feeds.  He can get all calories via tube feeds even if nauseated and not tolerating oral nutrition.    Dr Excell Seltzer to be taking over next week. Pt desires home dispo, but family not sure that is immediately workable.   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    LOS: 10 days    Stark Klein 01/09/2018

## 2018-01-09 NOTE — Progress Notes (Signed)
PROGRESS NOTE        PATIENT DETAILS Name: [name]. Age: 84 y.o. Sex: male Date of Birth: 01/13/1933 Admit Date: 12/30/2017 Admitting Physician Stark Klein, MD [id number], Alinda Sierras, MD  Brief Narrative: Patient is a 84 y.o. male who underwent diagnostic laparoscopy, open distal gastrectomy with Billroth II anastomosis and placement of a jejunostomy tube on 3/5 due to adenocarcinoma of the stomach-subsequent hospital course complicated by confusion, and subsequently developed hypotension and respiratory failure due to right lower lobe infiltrate/collapse due to mucous plugging requiring intubation.  Patient was provided supportive care, and subsequently improved and transferred back to a medical floor.  Hospitalist service was consulted on 3/14 for assistant and ongoing intermittent confusion.  Subjective: Patient is completely awake and alert this morning.  Assessment/Plan: Acute metabolic encephalopathy: I suspect this is multifactorial-due to  hypernatremia, hospital delirium.  But his mentation this morning is much improved from what has been described in the chart.  He is able to answer all my questions appropriately.  CT head without any major abnormalities-I do not think he requires a MRI of the brain-he has no focal neurological deficits-and his mentation is markedly better this morning.  We will see if can correct his sodium-it may improve his mental status further.  For now-would minimize narcotics as much as possible-provide redirection/reorientation-and other supportive measures.  Hypernatremia:Stop Lasix for now-he appears to be significantly below his dr weight-general surgery has increased his free water intake.  Repeat electrolytes in am  Combined chronic systolic/diastolic heart failure: Weight is much below his usual dry weight-with worsening hypernatremia-stopping Lasix.  Continue to follow weights closely.  If he does develop volume  overload-and his sodium is elevated-may need to consider metolazone rather than Lasix.  Aspiration pneumonia: Remains on Zosyn-which apparently was started on 3/9-suspect will require at least 7 days of empiric treatment.  Gastric cancer status post diagnostic laparoscopy with open distal gastrectomy with Billroth II-placement of a jejunostomy tube: Postop issues-deferred to the primary service.  DVT Prophylaxis: Prophylactic Lovenox   Code Status: Full code   Family Communication: None at bedside  Disposition Plan: Per primary service  Antimicrobial agents: Anti-infectives (From admission, onward)   Start     Dose/Rate Route Frequency Ordered Stop   01/05/18 1000  vancomycin (VANCOCIN) IVPB 1000 mg/200 mL premix  Status:  Discontinued     1,000 mg 200 mL/hr over 60 Minutes Intravenous Every 24 hours 01/04/18 0956 01/05/18 1319   01/04/18 1030  vancomycin (VANCOCIN) 1,500 mg in sodium chloride 0.9 % 500 mL IVPB     1,500 mg 250 mL/hr over 120 Minutes Intravenous  Once 01/04/18 0956 01/04/18 1314   01/03/18 0200  piperacillin-tazobactam (ZOSYN) IVPB 3.375 g     3.375 g 12.5 mL/hr over 240 Minutes Intravenous Every 8 hours 01/03/18 0112     12/30/17 1714  ceFAZolin (ANCEF) 2-4 GM/100ML-% IVPB    Comments:  Otelia Sergeant   : cabinet override      12/30/17 1714 12/31/17 0529   12/30/17 1600  ceFAZolin (ANCEF) IVPB 2g/100 mL premix     2 g 200 mL/hr over 30 Minutes Intravenous Every 8 hours 12/30/17 1356 12/30/17 1747   12/30/17 0545  ceFAZolin (ANCEF) 2-4 GM/100ML-% IVPB    Comments:  Waldron Session   : cabinet override      12/30/17 0545 12/30/17  2919   12/30/17 0542  ceFAZolin (ANCEF) IVPB 2g/100 mL premix     2 g 200 mL/hr over 30 Minutes Intravenous On call to O.R. 12/30/17 0542 12/30/17 0826     Time spent: 25  minutes-Greater than 50% of this time was spent in counseling, explanation of diagnosis, planning of further management, and coordination of  care.  MEDICATIONS: Scheduled Meds: . aspirin EC  81 mg Oral Daily  . carvedilol  6.25 mg Per Tube BID WC  . Chlorhexidine Gluconate Cloth  6 each Topical Daily  . [START ON 01/11/2018] enoxaparin (LOVENOX) injection  40 mg Subcutaneous Q24H  . feeding supplement  1 Container Oral BID BM  . free water  250 mL Per Tube Q8H  . pantoprazole  40 mg Oral Q1200  . potassium chloride  40 mEq Oral Daily  . sodium chloride flush  10-40 mL Intracatheter Q12H   Continuous Infusions: . dextrose 5 % and 0.45 % NaCl with KCl 20 mEq/L 50 mL/hr at 01/09/18 0600  . feeding supplement (OSMOLITE 1.2 CAL)    . piperacillin-tazobactam (ZOSYN)  IV 3.375 g (01/09/18 1007)   PRN Meds:.diphenhydrAMINE, haloperidol lactate, hydrALAZINE, iopamidol, nitroGLYCERIN, ondansetron **OR** ondansetron (ZOFRAN) IV, prochlorperazine, sodium chloride flush   PHYSICAL EXAM: Vital signs: Vitals:   01/08/18 0500 01/08/18 1250 01/08/18 2005 01/09/18 0503  BP:  (!) 165/65 (!) 151/68 (!) 156/56  Pulse:  68 70 82  Resp:  _0 Temp:  98.1 F (36.7 C) 97.9 F (36.6 C) 98.8 F (37.1 C)  TempSrc:  Oral Oral Oral  SpO2:  97% 99% 99%  Weight: 81.3 kg (179 lb 3.7 oz)     Height:       Filed Weights   01/06/18 0354 01/07/18 1100 01/08/18 0500  Weight: 79.1 kg (174 lb 6.1 oz) 73.2 kg (161 lb 6 oz) 81.3 kg (179 lb 3.7 oz)   Body mass index is 24.31 kg/m.   General appearance :Awake, alert, not in any distress. Eyes:, pupils equally reactive to light and accomodation,no scleral icterus.Pink conjunctiva HEENT: Atraumatic and Normocephalic Neck: supple, no JVD.  Resp:Good air entry bilaterally, no added sounds  CVS: S1 S2 regular GI: Bowel sounds present, appropriately tender-no peritoneal signs.   Extremities: B/L Lower Ext shows no edema, both legs are warm to touch Neurology:  speech clear,Non focal, sensation is grossly intact. Musculoskeletal:No digital cyanosis Skin:No Rash, warm and dry Wounds:N/A  I have  personally reviewed following labs and imaging studies  LABORATORY DATA: CBC: Recent Labs  Lab 01/03/18 0710 01/04/18 0449 01/07/18 0545 01/08/18 0502 01/09/18 0606  WBC 4.6 4.8 5.5 4.8 4.1  HGB 10.7* 8.9* 9.6* 9.1* 8.8*  HCT 34.6* 28.4* 31.2* 28.9* 28.6*  MCV 95.1 95.9 95.4 95.7 96.6  PLT 179 126* 158 170 166    Basic Metabolic Panel: Recent Labs  Lab 01/03/18 0010  01/03/18 0100  01/04/18 0449  01/06/18 0617 01/06/18 0929 01/07/18 0545 01/08/18 0502 01/09/18 0606  NA 147*   < >  --    < > 145  --   --  150* 152* 150* 154*  K 4.5   < >  --    < > 3.8  --   --  3.3* 3.1* 3.2* 3.4*  CL 122*   < >  --    < > 120*  --   --  115* 115* 114* 114*  CO2 22  --   --    < > 20*  --   --  _0 GLUCOSE 104*   < >  --    < > 127*  --   --  99 118* 119* 113*  BUN 18   < >  --    < > 24*  --   --  22* 21* 18 25*  CREATININE 1.21   < >  --    < > 1.37*   < > 1.27* 1.33* 1.31* 1.13 1.21  CALCIUM 7.9*  --   --    < > 7.7*  --   --  8.5* 8.2* 8.4* 9.2  MG  --   --  2.0  --   --   --   --  2.0  --   --   --   PHOS 3.3  --   --   --   --   --   --  3.7  --   --   --    < > = values in this interval not displayed.    GFR: Estimated Creatinine Clearance: 51.7 mL/min (by C-G formula based on SCr of 1.21 mg/dL).  Liver Function Tests: Recent Labs  Lab 01/02/18 1126 01/06/18 0929  AST 29 43*  ALT 14* 34  ALKPHOS 53 72  BILITOT 0.8 1.0  PROT 5.4* 5.2*  ALBUMIN 3.2* 2.5*   No results for input(s): LIPASE, AMYLASE in the last 168 hours. Recent Labs  Lab 01/02/18 1126  AMMONIA 28    Coagulation Profile: No results for input(s): INR, PROTIME in the last 168 hours.  Cardiac Enzymes: Recent Labs  Lab 01/02/18 1126 01/03/18 0950 01/03/18 1550 01/03/18 2148  TROPONINI 0.06* 1.85* 1.64* 1.16*    BNP (last 3 results) No results for input(s): PROBNP in the last 8760 hours.  HbA1C: No results for input(s): HGBA1C in the last 72 hours.  CBG: Recent Labs  Lab  01/08/18 1627 01/08/18 1941 01/08/18 2350 01/09/18 0354 01/09/18 1116  GLUCAP 101* 88 116* 94 102*    Lipid Profile: No results for input(s): CHOL, HDL, LDLCALC, TRIG, CHOLHDL, LDLDIRECT in the last 72 hours.  Thyroid Function Tests: Recent Labs    01/09/18 0606  TSH 2.854    Anemia Panel: Recent Labs    01/09/18 0606  VITAMINB12 927*  FOLATE 32.0  FERRITIN 55  TIBC 169*  IRON 61  RETICCTPCT 0.5    Urine analysis:    Component Value Date/Time   COLORURINE YELLOW 01/08/2018 1622   APPEARANCEUR CLEAR 01/08/2018 1622   LABSPEC 1.009 01/08/2018 1622   PHURINE 7.0 01/08/2018 1622   GLUCOSEU NEGATIVE 01/08/2018 1622   HGBUR NEGATIVE 01/08/2018 1622   BILIRUBINUR NEGATIVE 01/08/2018 1622   KETONESUR NEGATIVE 01/08/2018 1622   PROTEINUR NEGATIVE 01/08/2018 1622   UROBILINOGEN 1.0 08/23/2015 1302   NITRITE NEGATIVE 01/08/2018 1622   LEUKOCYTESUR NEGATIVE 01/08/2018 1622    Sepsis Labs: Lactic Acid, Venous    Component Value Date/Time   LATICACIDVEN 1.6 01/03/2018 0710    MICROBIOLOGY: Recent Results (from the past 240 hour(s))  MRSA PCR Screening     Status: None   Collection Time: 12/30/17  6:30 PM  Result Value Ref Range Status   MRSA by PCR NEGATIVE NEGATIVE Final    Comment:        The GeneXpert MRSA Assay (FDA approved for NASAL specimens only), is one component of a comprehensive MRSA colonization surveillance program. It is not intended to diagnose MRSA infection nor to guide or monitor treatment for MRSA infections. Performed  at [facility], [place] [address] [place], [place] [address]   Culture, respiratory (NON-Expectorated)     Status: None   Collection Time: 01/03/18 12:29 AM  Result Value Ref Range Status   Specimen Description   Final    TRACHEAL ASPIRATE Performed at [place] [address] [place], [place] [address]    Special Requests   Final    NONE Performed at [facility], [place] [address], [place], [place] [address]    Gram Stain   Final    FEW WBC PRESENT, PREDOMINANTLY PMN RARE SQUAMOUS EPITHELIAL CELLS PRESENT ABUNDANT GRAM POSITIVE COCCI ABUNDANT GRAM NEGATIVE RODS RARE GRAM POSITIVE RODS    Culture   Final    Consistent with normal respiratory flora. Performed at [facility], [place] [address], [place], [place] [address]    Report Status 01/05/2018 FINAL  Final  Culture, blood (routine x 2)     Status: None   Collection Time: 01/03/18  3:50 AM  Result Value Ref Range Status   Specimen Description   Final    BLOOD RIGHT FOREARM Performed at [place] [address] [place], [place] [address]    Special Requests   Final    IN PEDIATRIC BOTTLE Blood Culture adequate volume Performed at [place] [address] [place], [place] [address]    Culture   Final    NO GROWTH 5 DAYS Performed at [facility], [place] [address], [place], [place] [address]    Report Status 01/08/2018 FINAL  Final  Culture, blood (routine x 2)     Status: None   Collection Time: 01/03/18  3:50 AM  Result Value Ref Range Status   Specimen Description   Final    BLOOD RIGHT HAND Performed at [place] [address] [place], [place] [address]    Special Requests   Final    IN PEDIATRIC BOTTLE Blood Culture adequate volume Performed at [place] [address] [place], [place] [address]    Culture   Final    NO GROWTH 5 DAYS Performed at [facility], [place] [address], [place], [place] [address]    Report Status 01/08/2018 FINAL  Final  Culture, bal-quantitative     Status: Abnormal   Collection Time: 01/03/18 12:02 PM  Result Value Ref Range Status   Specimen Description   Final    BRONCHIAL ALVEOLAR LAVAGE Performed at [place] [address], [place], [place] [address]    Special Requests   Final    Normal Performed  at [facility], [place] [address], [place], [place] [address]    Gram Stain   Final    FEW WBC PRESENT,BOTH PMN AND MONONUCLEAR FEW SQUAMOUS EPITHELIAL CELLS PRESENT FEW GRAM POSITIVE COCCI IN CHAINS    Culture (A)  Final    >=100,000 COLONIES/mL Consistent with normal respiratory flora. Performed at [facility], [place] [address], [place], [place] [address]    Report Status 01/06/2018 FINAL  Final  Pneumocystis smear by DFA     Status: None   Collection Time: 01/03/18 12:02 PM  Result Value Ref Range Status   Specimen Source-PJSRC BRONCHIAL ALVEOLAR LAVAGE  Final   Pneumocystis jiroveci Ag NEGATIVE  Final    Comment: CALLED TO H.FRAIZER,RN 01/04/18 _0  BY V.WILKINS Performed at [facility], [place] [address] [place], [place]  [address]   Acid Fast Smear (AFB)     Status: None   Collection Time: 01/03/18 12:03 PM  Result Value Ref Range Status   AFB Specimen Processing Concentration  Final   Acid Fast Smear Negative  Final    Comment: (NOTE) Performed At: [facility] [place], [place] [id number] Rush Farmer MD [id number]    Source (AFB) BRONCHIAL ALVEOLAR LAVAGE  Final    Comment: Performed at [facility], [place] [address] [place], [place] [address]  Fungus Culture With Stain     Status: None (Preliminary result)   Collection Time: 01/03/18 12:03 PM  Result Value Ref Range Status   Fungus Stain Final report  Final    Comment: (NOTE) Performed At: [facility] [place], [place] [id number] Rush Farmer MD [id number]    Fungus (Mycology) Culture PENDING  Incomplete   Fungal Source BRONCHIAL ALVEOLAR LAVAGE  Final    Comment: Performed at [facility], [place] [address], [place], [place] [address]  Fungus Culture Result     Status: None   Collection Time: 01/03/18 12:03 PM  Result Value Ref Range Status   Result 1 Comment  Final    Comment:  (NOTE) Fungal elements, such as arthroconidia, hyphal fragments, chlamydoconidia, observed. Performed At: [facility] [place], [place] [id number] Rush Farmer MD [id number] Performed at [facility], [place] [address] [place], [place] [address]     RADIOLOGY STUDIES/RESULTS: Ct Head Wo Contrast  Result Date: 01/02/2018 CLINICAL DATA:  Altered level of consciousness and confusion. EXAM: CT HEAD WITHOUT CONTRAST TECHNIQUE: Contiguous axial images were obtained from the base of the skull through the vertex without intravenous contrast. COMPARISON:  09/10/2017 FINDINGS: Brain: Generalized atrophy. Chronic small-vessel ischemic changes throughout the white matter. No sign of acute infarction, mass lesion, hemorrhage, hydrocephalus or extra-axial collection. Vascular: No abnormal vascular finding. Skull: Normal Sinuses/Orbits: Clear/normal Other: None IMPRESSION: No acute finding. Chronic atrophy and small-vessel ischemic changes of the brain. Electronically Signed   By: Nelson Chimes M.D.   On: 01/02/2018 10:59   Ct Abdomen Pelvis W Contrast  Result Date: 12/16/2017 CLINICAL DATA:  Abdominal pain, nausea and vomiting, and abdominal distention. Newly diagnosed gastric carcinoma. Mantle cell lymphoma. EXAM: CT ABDOMEN AND PELVIS WITH CONTRAST TECHNIQUE: Multidetector CT imaging of the abdomen and pelvis was performed using the standard protocol following bolus administration of intravenous contrast. CONTRAST:  129m ISOVUE-300 IOPAMIDOL (ISOVUE-300) INJECTION 61% COMPARISON:  PET-CT on 09/08/2017 FINDINGS: Lower Chest: Small bilateral pleural effusions. Hepatobiliary: No hepatic masses identified. Gallbladder is unremarkable. Pancreas:  No mass or inflammatory changes. Spleen: Within normal limits in size and appearance. Adrenals/Urinary Tract: No masses identified. No evidence of hydronephrosis. Stomach/Bowel: Gastric soft tissue mass involving the distal body  and antrum measures 5.7 x 3.5 cm on image 18/2, and appears increased in size since previous study. No evidence of gastric outlet or bowel obstruction, inflammatory process, or abnormal fluid collections. Vascular/Lymphatic: No pathologically enlarged lymph nodes. No abdominal aortic aneurysm. Aortic atherosclerosis. Reproductive:  No mass or other significant abnormality. Other:  None. Musculoskeletal:  No suspicious bone lesions identified. IMPRESSION: Increased size of 5.7 cm gastric mass, consistent with recently diagnosed gastric carcinoma. No evidence of metastatic disease. No evidence of gastric outlet obstruction. New small bilateral pleural effusions incidentally noted. Electronically Signed   By: JEarle GellM.D.   On: 12/16/2017 09:27   Dg Chest Port 1 View  Result Date: 01/08/2018  CLINICAL DATA:  Status post bronchoscopy EXAM: PORTABLE CHEST 1 VIEW COMPARISON:  Three days ago FINDINGS: Improved aeration in the lower lungs with better diaphragm visualization. There is no edema, consolidation, effusion, or pneumothorax. Chronic cardiomegaly and aortic tortuosity. Left IJ catheter has been removed. IMPRESSION: Continued improvement in lower lung aeration. Electronically Signed   By: Monte Fantasia M.D.   On: 01/08/2018 09:46   Dg Chest Port 1 View  Result Date: 01/05/2018 CLINICAL DATA:  Pneumonia. EXAM: PORTABLE CHEST 1 VIEW COMPARISON:  Chest x-ray dated 01/03/2018. FINDINGS: Continued improvement in aeration at the right lung base, perhaps mild residual atelectasis. Probable additional mild atelectasis at the left lung base. Heart size and mediastinal contours are stable. Endotracheal tube is been removed. Enteric tube has been removed. Left IJ central line is stable in position with tip at the level of the upper SVC. No pneumothorax seen. IMPRESSION: 1. Continued improvement in aeration at the right lung base. Probable mild residual atelectasis. Suspect additional mild atelectasis at the left  lung base. 2. No new or developing opacity to suggest pneumonia. Electronically Signed   By: Franki Cabot M.D.   On: 01/05/2018 14:12   Dg Chest Port 1 View  Result Date: 01/03/2018 CLINICAL DATA:  S/p right lobe bronchoscopy EXAM: PORTABLE CHEST 1 VIEW COMPARISON:  Chest x-ray from earlier same day. Chest x-ray dated 01/02/2018. FINDINGS: Improved aeration at the right lung base, presumably resolving airspace collapse. Commensurate reduction of the rightward shift of the mediastinal structures. Endotracheal tube remains well positioned with tip approximately 2.5 cm above the carina. Enteric tube passes below the diaphragm. Left IJ central line is stable in position with tip at the level of the upper SVC. Heart size is stable.  No pneumothorax seen. IMPRESSION: 1. Improved aeration at the right lung base compared to the chest x-ray from earlier today, presumably resolving airspace collapse status post bronchoscopy. Commensurate reduction of the rightward shift of the mediastinal structures. 2. Support apparatus appears stable in position. Electronically Signed   By: Franki Cabot M.D.   On: 01/03/2018 12:32   Dg Chest Port 1 View  Result Date: 01/03/2018 CLINICAL DATA:  Central line placement. EXAM: PORTABLE CHEST 1 VIEW COMPARISON:  Chest radiograph performed 01/02/2018 FINDINGS: The patient's endotracheal tube is seen ending 4-5 cm above the carina. The balloon of the endotracheal tube may be slightly overinflated. The left IJ line is noted ending about the proximal SVC. An enteric tube is noted extending below the diaphragm. There is right-sided volume loss, with apparent rightward shift of the mediastinum. A small right pleural effusion is suspected. The left lung appears clear. No pneumothorax is seen. The cardiomediastinal silhouette is borderline normal in size. External pacing pads are seen. No acute osseous abnormalities are identified. IMPRESSION: 1. Left IJ line noted ending about the proximal SVC.  2. Endotracheal tube seen ending 4-5 cm above the carina. The balloon of the endotracheal tube may be slightly overinflated. 3. Right-sided volume loss, with apparent rightward shift of the mediastinum. Small right pleural effusion noted. Electronically Signed   By: Garald Balding M.D.   On: 01/03/2018 03:20   Portable Chest X-ray  Result Date: 01/02/2018 CLINICAL DATA:  84 y/o  M; intubation. EXAM: PORTABLE CHEST 1 VIEW COMPARISON:  01/02/2018 chest radiograph. FINDINGS: Patient is rotated. Stable cardiac silhouette given projection and technique. Transcutaneous pacing pads. Stable reticular opacities. Right lung base linear opacity, likely atelectasis. Improved aeration of left lung base. Bones are unremarkable. Endotracheal tube tip  projects 5.9 cm above carina. Enteric tube tip is in the proximal stomach. IMPRESSION: Endotracheal tube tip projects 5.9 cm above carina. Enteric tube tip is in proximal stomach. Persistent reticular opacities, probably edema. Improved aeration of left lung base and new platelike atelectasis at right lung base. Electronically Signed   By: Kristine Garbe M.D.   On: 01/02/2018 21:14   Dg Chest Port 1 View  Result Date: 01/02/2018 CLINICAL DATA:  84 y/o  M; respiratory distress. EXAM: PORTABLE CHEST 1 VIEW COMPARISON:  01/02/2018 chest radiograph. FINDINGS: Stable cardiomegaly given projection and technique. Diffuse reticular opacities of the lungs. Persistent left lower lobe opacity. No pneumothorax. Bones are unremarkable. IMPRESSION: Increased reticular opacities of the lungs probably representing interstitial pulmonary edema. Persistent left basilar opacity which may represent effusion and atelectasis, less likely pneumonia. Electronically Signed   By: Kristine Garbe M.D.   On: 01/02/2018 20:26   Dg Chest Port 1 View  Result Date: 01/02/2018 CLINICAL DATA:  Confusion altered mental status EXAM: PORTABLE CHEST 1 VIEW COMPARISON:  09/10/2017 FINDINGS:  Heart size mildly enlarged. Negative for heart failure. Mild left lower lobe atelectasis/infiltrate. No effusion. Epidural anesthetic catheter overlies the right chest. IMPRESSION: Mild left lower lobe atelectasis/infiltrate. Cardiac enlargement without heart failure. Electronically Signed   By: Franchot Gallo M.D.   On: 01/02/2018 11:33   Dg Abd Portable 1v  Result Date: 12/31/2017 CLINICAL DATA:  NGT placement EXAM: PORTABLE ABDOMEN - 1 VIEW COMPARISON:  12/16/2017 CT FINDINGS: Tip and side port of a gastric tube are noted in the left upper quadrant of the abdomen presumably in the expected location of the stomach. Left-sided approach catheter is also noted with tip projecting over the lower lumbar spine at the level of L5. Midline skin staples are present as well as chain sutures in the left upper quadrant. Stool is noted within large bowel without bowel obstruction. No apparent free air. IMPRESSION: Gastric tube is seen in the left upper quadrant in the expected location of the stomach. Electronically Signed   By: Ashley Royalty M.D.   On: 12/31/2017 00:05   Dg Ugi W/high Density W/kub  Result Date: 01/08/2018 CLINICAL DATA:  Postop day 9 partial gastrectomy for cancer. Nausea. Vomiting. EXAM: WATER SOLUBLE UPPER GI SERIES TECHNIQUE: Single-column upper GI series was performed using water soluble contrast. CONTRAST:  100 mL Isovue-300 orally COMPARISON:  12/30/2017, CT 12/16/2017 FLUOROSCOPY TIME:  Fluoroscopy Time:  3 minutes 0 seconds Radiation Exposure Index (if provided by the fluoroscopic device): Number of Acquired Spot Images: 0 FINDINGS: Preliminary KUB demonstrates normal bowel gas pattern. Jejunostomy feeding tube is present in the left abdomen. Decreased esophageal motility. Mild esophageal dilatation. Contrast passed into the stomach which has been partially resected. Gastric jejunostomy is patent. No leak. No obstruction of the stomach. Jejunum is nondilated. Mild gastroesophageal reflux  IMPRESSION: Postop partial gastrectomy with gastrojejunostomy. No leak or obstruction identified Decreased esophageal peristalsis.  Mild gastroesophageal reflux. Electronically Signed   By: Franchot Gallo M.D.   On: 01/08/2018 13:12     LOS: 10 days   Oren Binet, MD  [facility] Pager:[phone]  If 7PM-7AM, please contact night-coverage [email or web address] Password [facility] 01/09/2018, 11:29 AM

## 2018-01-10 DIAGNOSIS — R0603 Acute respiratory distress: Secondary | ICD-10-CM

## 2018-01-10 DIAGNOSIS — C160 Malignant neoplasm of cardia: Secondary | ICD-10-CM

## 2018-01-10 LAB — BASIC METABOLIC PANEL
Anion gap: 13 (ref 5–15)
BUN: 43 mg/dL — AB (ref 6–20)
CHLORIDE: 114 mmol/L — AB (ref 101–111)
CO2: 25 mmol/L (ref 22–32)
CREATININE: 1.16 mg/dL (ref 0.61–1.24)
Calcium: 9.1 mg/dL (ref 8.9–10.3)
GFR calc non Af Amer: 57 mL/min — ABNORMAL LOW (ref >60)
Glucose, Bld: 123 mg/dL — ABNORMAL HIGH (ref 65–99)
POTASSIUM: 3.6 mmol/L (ref 3.5–5.1)
SODIUM: 152 mmol/L — AB (ref 135–145)

## 2018-01-10 LAB — CBC
HCT: 28.7 % — ABNORMAL LOW (ref 39.0–52.0)
HEMOGLOBIN: 8.6 g/dL — AB (ref 13.0–17.0)
MCH: 29 pg (ref 26.0–34.0)
MCHC: 30 g/dL (ref 30.0–36.0)
MCV: 96.6 fL (ref 78.0–100.0)
PLATELETS: 211 10*3/uL (ref 150–400)
RBC: 2.97 MIL/uL — AB (ref 4.22–5.81)
RDW: 17.3 % — ABNORMAL HIGH (ref 11.5–15.5)
WBC: 6.6 10*3/uL (ref 4.0–10.5)

## 2018-01-10 LAB — GLUCOSE, CAPILLARY
GLUCOSE-CAPILLARY: 106 mg/dL — AB (ref 65–99)
GLUCOSE-CAPILLARY: 113 mg/dL — AB (ref 65–99)
GLUCOSE-CAPILLARY: 90 mg/dL (ref 65–99)
Glucose-Capillary: 104 mg/dL — ABNORMAL HIGH (ref 65–99)
Glucose-Capillary: 113 mg/dL — ABNORMAL HIGH (ref 65–99)
Glucose-Capillary: 91 mg/dL (ref 65–99)

## 2018-01-10 MED ORDER — POTASSIUM CL IN DEXTROSE 5% 20 MEQ/L IV SOLN
20.0000 meq | INTRAVENOUS | Status: DC
  Administered 2018-01-10 – 2018-01-11 (×2): 20 meq via INTRAVENOUS
  Filled 2018-01-10 (×3): qty 1000

## 2018-01-10 MED ORDER — POTASSIUM CHLORIDE 20 MEQ/15ML (10%) PO SOLN
40.0000 meq | Freq: Every day | ORAL | Status: DC
  Administered 2018-01-10 – 2018-01-22 (×13): 40 meq
  Filled 2018-01-10 (×13): qty 30

## 2018-01-10 MED ORDER — ACETAMINOPHEN 160 MG/5ML PO SOLN
650.0000 mg | ORAL | Status: DC | PRN
  Administered 2018-01-10 – 2018-01-21 (×23): 650 mg
  Filled 2018-01-10 (×24): qty 20.3

## 2018-01-10 NOTE — Progress Notes (Signed)
Patient ID: [name]., male   DOB: June 01, 1933, 84 y.o.   MRN: [id number] [facility] Progress Note:   11 Days Post-Op  Subjective: Mental status is clear;  Sitting up in chair following some dark hematemesis Objective: Vital signs in last 24 hours: Temp:  [97.6 F (36.4 C)-98.3 F (36.8 C)] 97.6 F (36.4 C) (03/16 0553) Pulse Rate:  [87-102] 102 (03/16 0553) Resp:  [16-18] 16 (03/16 0553) BP: (121-163)/(64-85) 121/79 (03/16 0553) SpO2:  [100 %] 100 % (03/16 0553) Weight:  [81.2 kg (179 lb 0.2 oz)] 81.2 kg (179 lb 0.2 oz) (03/16 0500)  Intake/Output from previous day: 03/15 0701 - 03/16 0700 In: 4077.3 [P.O.:460; I.V.:472.5; NG/GT:2994.8; IV Piggyback:150] Out: 850 [Urine:550] Intake/Output this shift: No intake/output data recorded.  Physical Exam: Work of breathing is not labored.  TF at 80 cc/hr  Lab Results:  Results for orders placed or performed during the hospital encounter of 12/30/17 (from the past 48 hour(s))  Glucose, capillary     Status: None   Collection Time: 01/08/18 12:41 PM  Result Value Ref Range   Glucose-Capillary 91 65 - 99 mg/dL  Urinalysis, Routine w reflex microscopic     Status: None   Collection Time: 01/08/18  4:22 PM  Result Value Ref Range   Color, Urine YELLOW YELLOW   APPearance CLEAR CLEAR   Specific Gravity, Urine 1.009 1.005 - 1.030   pH 7.0 5.0 - 8.0   Glucose, UA NEGATIVE NEGATIVE mg/dL   Hgb urine dipstick NEGATIVE NEGATIVE   Bilirubin Urine NEGATIVE NEGATIVE   Ketones, ur NEGATIVE NEGATIVE mg/dL   Protein, ur NEGATIVE NEGATIVE mg/dL   Nitrite NEGATIVE NEGATIVE   Leukocytes, UA NEGATIVE NEGATIVE    Comment: Performed at [facility] [address], [place], [place] [address]  Glucose, capillary     Status: Abnormal   Collection Time: 01/08/18  4:27 PM  Result Value Ref Range   Glucose-Capillary 101 (H) 65 - 99 mg/dL  Glucose, capillary     Status: None   Collection Time: 01/08/18  7:41  PM  Result Value Ref Range   Glucose-Capillary 88 65 - 99 mg/dL  Glucose, capillary     Status: Abnormal   Collection Time: 01/08/18 11:50 PM  Result Value Ref Range   Glucose-Capillary 116 (H) 65 - 99 mg/dL  Glucose, capillary     Status: None   Collection Time: 01/09/18  3:54 AM  Result Value Ref Range   Glucose-Capillary 94 65 - 99 mg/dL  CBC     Status: Abnormal   Collection Time: 01/09/18  6:06 AM  Result Value Ref Range   WBC 4.1 4.0 - 10.5 K/uL   RBC 2.96 (L) 4.22 - 5.81 MIL/uL   Hemoglobin 8.8 (L) 13.0 - 17.0 g/dL   HCT 28.6 (L) 39.0 - 52.0 %   MCV 96.6 78.0 - 100.0 fL   MCH 29.7 26.0 - 34.0 pg   MCHC 30.8 30.0 - 36.0 g/dL   RDW 17.1 (H) 11.5 - 15.5 %   Platelets 181 150 - 400 K/uL    Comment: Performed at [facility], [place] [address], [place], [place] [address]  Basic metabolic panel     Status: Abnormal   Collection Time: 01/09/18  6:06 AM  Result Value Ref Range   Sodium 154 (H) 135 - 145 mmol/L   Potassium 3.4 (L) 3.5 - 5.1 mmol/L   Chloride 114 (H) 101 - 111 mmol/L   CO2  28 22 - 32 mmol/L   Glucose, Bld 113 (H) 65 - 99 mg/dL   BUN 25 (H) 6 - 20 mg/dL   Creatinine, Ser 1.21 0.61 - 1.24 mg/dL   Calcium 9.2 8.9 - 10.3 mg/dL   GFR calc non Af Amer 54 (L) >60 mL/min   GFR calc Af Amer >60 >60 mL/min    Comment: (NOTE) The eGFR has been calculated using the CKD EPI equation. This calculation has not been validated in all clinical situations. eGFR's persistently <60 mL/min signify possible Chronic Kidney Disease.    Anion gap 12 5 - 15    Comment: Performed at [facility], [place] [address], [place], [place] [address]  TSH     Status: None   Collection Time: 01/09/18  6:06 AM  Result Value Ref Range   TSH 2.854 0.350 - 4.500 uIU/mL    Comment: Performed by a 3rd Generation assay with a functional sensitivity of <=0.01 uIU/mL. Performed at [facility], [place] [address], [place], [place] [address]   Vitamin  B12     Status: Abnormal   Collection Time: 01/09/18  6:06 AM  Result Value Ref Range   Vitamin B-12 927 (H) 180 - 914 pg/mL    Comment: (NOTE) This assay is not validated for testing neonatal or myeloproliferative syndrome specimens for Vitamin B12 levels. Performed at [facility], [place] [address], [place], [place] [address]   Folate     Status: None   Collection Time: 01/09/18  6:06 AM  Result Value Ref Range   Folate 32.0 >5.9 ng/mL    Comment: Performed at [facility] [address] [place], [place] [address]  Iron and TIBC     Status: Abnormal   Collection Time: 01/09/18  6:06 AM  Result Value Ref Range   Iron 61 45 - 182 ug/dL   TIBC 169 (L) 250 - 450 ug/dL   Saturation Ratios 36 17.9 - 39.5 %   UIBC 108 ug/dL    Comment: Performed at [facility], [place] [address] [place], [place] [address]  Ferritin     Status: None   Collection Time: 01/09/18  6:06 AM  Result Value Ref Range   Ferritin 55 24 - 336 ng/mL    Comment: Performed at [facility], [place] [address], [place], [place] [address]  Reticulocytes     Status: Abnormal   Collection Time: 01/09/18  6:06 AM  Result Value Ref Range   Retic Ct Pct 0.5 0.4 - 3.1 %   RBC. 2.96 (L) 4.22 - 5.81 MIL/uL   Retic Count, Absolute 14.8 (L) 19.0 - 186.0 K/uL    Comment: Performed at [facility], [place] [address] [place], [place] [address]  Glucose, capillary     Status: Abnormal   Collection Time: 01/09/18 11:16 AM  Result Value Ref Range   Glucose-Capillary 102 (H) 65 - 99 mg/dL  Glucose, capillary     Status: Abnormal   Collection Time: 01/09/18  4:14 PM  Result Value Ref Range   Glucose-Capillary 102 (H) 65 - 99 mg/dL  Glucose, capillary     Status: Abnormal   Collection Time: 01/09/18  8:09 PM  Result Value Ref Range   Glucose-Capillary 114 (H) 65 - 99 mg/dL  Glucose, capillary     Status: None   Collection Time: 01/09/18 11:54 PM  Result Value Ref Range   Glucose-Capillary  90 65 - 99 mg/dL  Glucose, capillary     Status: Abnormal   Collection Time: 01/10/18  4:11 AM  Result Value Ref Range   Glucose-Capillary 113 (H) 65 - 99 mg/dL  CBC     Status: Abnormal   Collection Time: 01/10/18  5:08 AM  Result Value Ref Range   WBC 6.6 4.0 - 10.5 K/uL   RBC 2.97 (L) 4.22 - 5.81 MIL/uL   Hemoglobin 8.6 (L) 13.0 - 17.0 g/dL   HCT 28.7 (L) 39.0 - 52.0 %   MCV 96.6 78.0 - 100.0 fL   MCH 29.0 26.0 - 34.0 pg   MCHC 30.0 30.0 - 36.0 g/dL   RDW 17.3 (H) 11.5 - 15.5 %   Platelets 211 150 - 400 K/uL    Comment: Performed at [facility], [place] [address], [place], [place] [address]  Basic metabolic panel     Status: Abnormal   Collection Time: 01/10/18  5:08 AM  Result Value Ref Range   Sodium 152 (H) 135 - 145 mmol/L   Potassium 3.6 3.5 - 5.1 mmol/L   Chloride 114 (H) 101 - 111 mmol/L   CO2 25 22 - 32 mmol/L   Glucose, Bld 123 (H) 65 - 99 mg/dL   BUN 43 (H) 6 - 20 mg/dL   Creatinine, Ser 1.16 0.61 - 1.24 mg/dL   Calcium 9.1 8.9 - 10.3 mg/dL   GFR calc non Af Amer 57 (L) >60 mL/min   GFR calc Af Amer >60 >60 mL/min    Comment: (NOTE) The eGFR has been calculated using the CKD EPI equation. This calculation has not been validated in all clinical situations. eGFR's persistently <60 mL/min signify possible Chronic Kidney Disease.    Anion gap 13 5 - 15    Comment: Performed at [facility], [place] [address], [place], [place] [address]  Glucose, capillary     Status: Abnormal   Collection Time: 01/10/18  8:36 AM  Result Value Ref Range   Glucose-Capillary 113 (H) 65 - 99 mg/dL    Radiology/Results: Dg Ugi W/high Density W/kub  Result Date: 01/08/2018 CLINICAL DATA:  Postop day 9 partial gastrectomy for cancer. Nausea. Vomiting. EXAM: WATER SOLUBLE UPPER GI SERIES TECHNIQUE: Single-column upper GI series was performed using water soluble contrast. CONTRAST:  100 mL Isovue-300 orally COMPARISON:  12/30/2017, CT 12/16/2017  FLUOROSCOPY TIME:  Fluoroscopy Time:  3 minutes 0 seconds Radiation Exposure Index (if provided by the fluoroscopic device): Number of Acquired Spot Images: 0 FINDINGS: Preliminary KUB demonstrates normal bowel gas pattern. Jejunostomy feeding tube is present in the left abdomen. Decreased esophageal motility. Mild esophageal dilatation. Contrast passed into the stomach which has been partially resected. Gastric jejunostomy is patent. No leak. No obstruction of the stomach. Jejunum is nondilated. Mild gastroesophageal reflux IMPRESSION: Postop partial gastrectomy with gastrojejunostomy. No leak or obstruction identified Decreased esophageal peristalsis.  Mild gastroesophageal reflux. Electronically Signed   By: Franchot Gallo M.D.   On: 01/08/2018 13:12    Anti-infectives: Anti-infectives (From admission, onward)   Start     Dose/Rate Route Frequency Ordered Stop   01/05/18 1000  vancomycin (VANCOCIN) IVPB 1000 mg/200 mL premix  Status:  Discontinued     1,000 mg 200 mL/hr over 60 Minutes Intravenous Every 24 hours 01/04/18 0956 01/05/18 1319   01/04/18 1030  vancomycin (VANCOCIN) 1,500 mg in sodium chloride 0.9 % 500 mL IVPB     1,500 mg 250 mL/hr over 120 Minutes Intravenous  Once 01/04/18 0956 01/04/18 1314  01/03/18 0200  piperacillin-tazobactam (ZOSYN) IVPB 3.375 g     3.375 g 12.5 mL/hr over 240 Minutes Intravenous Every 8 hours 01/03/18 0112     12/30/17 1714  ceFAZolin (ANCEF) 2-4 GM/100ML-% IVPB    Comments:  Otelia Sergeant   : cabinet override      12/30/17 1714 12/31/17 0529   12/30/17 1600  ceFAZolin (ANCEF) IVPB 2g/100 mL premix     2 g 200 mL/hr over 30 Minutes Intravenous Every 8 hours 12/30/17 1356 12/30/17 1747   12/30/17 0545  ceFAZolin (ANCEF) 2-4 GM/100ML-% IVPB    Comments:  Waldron Session   : cabinet override      12/30/17 0545 12/30/17 0756   12/30/17 0542  ceFAZolin (ANCEF) IVPB 2g/100 mL premix     2 g 200 mL/hr over 30 Minutes Intravenous On call to O.R.  12/30/17 0542 12/30/17 0826      Assessment/Plan: Problem List: Patient Active Problem List   Diagnosis Date Noted  . HCAP (healthcare-associated pneumonia)   . Acute respiratory failure ([place])   . Gastric cancer ([place]) 12/30/2017  . Malignant neoplasm of body of stomach ([place]) 12/04/2017  . Counseling regarding advanced care planning and goals of care 08/07/2017  . Drug-induced neutropenia ([place]) 06/26/2017  . Protein-calorie malnutrition, severe ([place])   . SOB (shortness of breath)   . Hypokalemia 03/17/2017  . Hypernatremia 03/17/2017  . AKI (acute kidney injury) ([place]) 03/17/2017  . Dehydration 03/17/2017  . Thrush, oral 03/17/2017  . Mantle cell lymphoma of lymph nodes of multiple regions ([place]) 02/24/2017  . 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 GI bleed 02/03/2017  . Lower GI bleed 02/03/2017  . Coronary artery disease   . IFG (impaired fasting glucose) 10/03/2015  . PMR (polymyalgia rheumatica) ([facility]) 09/07/2015  . Orthostatic hypotension 08/24/2015  . Viral syndrome 08/24/2015  . GERD (gastroesophageal reflux disease) 03/24/2014  . ACP (advance care planning) 11/02/2013  . Left upper arm pain 09/15/2012  . Routine health maintenance 10/29/2011  . HEARING LOSS 11/05/2010  . Chest pain 11/06/2009  . SKIN CANCER, RECURRENT 10/13/2007  . Hyperlipidemia 10/13/2007  . Gout 10/13/2007  . Essential hypertension 10/13/2007  . PVC (premature ventricular contraction) 10/13/2007  . PSORIASIS 10/13/2007  . OSTEOARTHRITIS, ANKLE, RIGHT 10/13/2007  . Other acquired absence of organ 10/13/2007    Discussed how he felt about rate of TF and he felt he was getting bloated and too much.  Reduced to 60 cc /hr Hypernatremis treatment in progress.  11 Days Post-Op    LOS: 11 days   Matt B. Hassell Done, MD, [facility] [phone] beeper [phone]  01/10/2018 9:50 AM

## 2018-01-10 NOTE — Progress Notes (Signed)
Dr. Harlow Asa aware via phone pt c/o of leg and back pain intermittently today. Up in chair and ambulated in hallway with assist. See new orders received.

## 2018-01-10 NOTE — Progress Notes (Signed)
Hospitalist consult Progress Note        PATIENT DETAILS Name: [name]. Age: 84 y.o. Sex: male Date of Birth: 14-Apr-1933 Admit Date: 12/30/2017 Admitting Physician Stark Klein, MD RKY:[id number], Alinda Sierras, MD  Brief Narrative: Patient is a 84 y.o. male who underwent diagnostic laparoscopy, open distal gastrectomy with Billroth II anastomosis and placement of a jejunostomy tube on 3/5 due to adenocarcinoma of the stomach-subsequent hospital course complicated by confusion, and subsequently developed hypotension and respiratory failure due to right lower lobe infiltrate/collapse due to mucous plugging requiring intubation.  Patient was provided supportive care, and subsequently improved and transferred back to a medical floor.  Hospitalist service was consulted on 3/14 for assistant and ongoing intermittent confusion.  Subjective:  Patient in bed, appears comfortable, denies any headache, no fever, no chest pain or pressure, no shortness of breath , no abdominal pain. No focal weakness.  Does have nausea.  Assessment/Plan:  Acute metabolic encephalopathy/delirium: Combination of hyponatremia, dehydration along with required daily in the setting of acute illness.  CT head unremarkable, he has no headache or focal deficits, continue supportive care, minimize narcotics and benzodiazepines, exposed to sunlight, encourage sitting up in chair and monitor.  Hypernatremia: Lasix has been stopped, changed to D5W along with free water as he can tolerate to PEG tube.  Combined chronic systolic/diastolic heart failure: Currently dehydrated diuretics on hold monitor closely.  Aspiration pneumonia: Remains on Zosyn-which apparently was started on 3/9-suspect will require at least 7 days of empiric treatment.  Gastric cancer status post diagnostic laparoscopy with open distal gastrectomy with Billroth II-placement of a jejunostomy tube: Postop issues-deferred to the  primary service.    DVT Prophylaxis: Prophylactic Lovenox   Code Status: Full code   Family Communication: None at bedside  Disposition Plan: Per primary service  Antimicrobial agents: Anti-infectives (From admission, onward)   Start     Dose/Rate Route Frequency Ordered Stop   01/05/18 1000  vancomycin (VANCOCIN) IVPB 1000 mg/200 mL premix  Status:  Discontinued     1,000 mg 200 mL/hr over 60 Minutes Intravenous Every 24 hours 01/04/18 0956 01/05/18 1319   01/04/18 1030  vancomycin (VANCOCIN) 1,500 mg in sodium chloride 0.9 % 500 mL IVPB     1,500 mg 250 mL/hr over 120 Minutes Intravenous  Once 01/04/18 0956 01/04/18 1314   01/03/18 0200  piperacillin-tazobactam (ZOSYN) IVPB 3.375 g     3.375 g 12.5 mL/hr over 240 Minutes Intravenous Every 8 hours 01/03/18 0112     12/30/17 1714  ceFAZolin (ANCEF) 2-4 GM/100ML-% IVPB    Comments:  Otelia Sergeant   : cabinet override      12/30/17 1714 12/31/17 0529   12/30/17 1600  ceFAZolin (ANCEF) IVPB 2g/100 mL premix     2 g 200 mL/hr over 30 Minutes Intravenous Every 8 hours 12/30/17 1356 12/30/17 1747   12/30/17 0545  ceFAZolin (ANCEF) 2-4 GM/100ML-% IVPB    Comments:  Waldron Session   : cabinet override      12/30/17 0545 12/30/17 0756   12/30/17 0542  ceFAZolin (ANCEF) IVPB 2g/100 mL premix     2 g 200 mL/hr over 30 Minutes Intravenous On call to O.R. 12/30/17 0542 12/30/17 0826     Time spent: 25  minutes-Greater than 50% of this time was spent in counseling, explanation of diagnosis, planning of further management,  and coordination of care.  MEDICATIONS: Scheduled Meds: . aspirin EC  81 mg Oral Daily  . carvedilol  6.25 mg Per Tube BID WC  . Chlorhexidine Gluconate Cloth  6 each Topical Daily  . [START ON 01/11/2018] enoxaparin (LOVENOX) injection  40 mg Subcutaneous Q24H  . feeding supplement  1 Container Oral BID BM  . free water  250 mL Per Tube Q8H  . pantoprazole  40 mg Oral Q1200  . potassium chloride  40 mEq  Per Tube Daily   Continuous Infusions: . dextrose 5 % with KCl 20 mEq / L 20 mEq (01/10/18 1058)  . feeding supplement (OSMOLITE 1.2 CAL) 1,000 mL (01/10/18 0555)  . piperacillin-tazobactam (ZOSYN)  IV 3.375 g (01/10/18 1016)   PRN Meds:.diphenhydrAMINE, haloperidol lactate, hydrALAZINE, iopamidol, nitroGLYCERIN, ondansetron **OR** ondansetron (ZOFRAN) IV, prochlorperazine   PHYSICAL EXAM: Vital signs: Vitals:   01/10/18 0054 01/10/18 0500 01/10/18 0553 01/10/18 1000  BP:   121/79 106/66  Pulse:   (!) 102 97  Resp:   16 16  Temp:   97.6 F (36.4 C) 97.8 F (36.6 C)  TempSrc:   Oral Oral  SpO2:   100% 100%  Weight: 81.2 kg (179 lb 0.2 oz) 81.2 kg (179 lb 0.2 oz)    Height:       Filed Weights   01/08/18 0500 01/10/18 0054 01/10/18 0500  Weight: 81.3 kg (179 lb 3.7 oz) 81.2 kg (179 lb 0.2 oz) 81.2 kg (179 lb 0.2 oz)   Body mass index is 24.28 kg/m.   Exam  Awake Alert,  No new F.N deficits, Normal affect Stella.AT,PERRAL Supple Neck,No JVD, No cervical lymphadenopathy appriciated.  Symmetrical Chest wall movement, Good air movement bilaterally, CTAB RRR,No Gallops, Rubs or new Murmurs, No Parasternal Heave +ve B.Sounds, Abd Soft, PEG tube is in place, midline abdominal incision stable  No Cyanosis, Clubbing or edema, No new Rash or bruise   I have personally reviewed following labs and imaging studies  LABORATORY DATA: CBC: Recent Labs  Lab 01/04/18 0449 01/07/18 0545 01/08/18 0502 01/09/18 0606 01/10/18 0508  WBC 4.8 5.5 4.8 4.1 6.6  HGB 8.9* 9.6* 9.1* 8.8* 8.6*  HCT 28.4* 31.2* 28.9* 28.6* 28.7*  MCV 95.9 95.4 95.7 96.6 96.6  PLT 126* 158 170 181 595    Basic Metabolic Panel: Recent Labs  Lab 01/06/18 0929 01/07/18 0545 01/08/18 0502 01/09/18 0606 01/10/18 0508  NA 150* 152* 150* 154* 152*  K 3.3* 3.1* 3.2* 3.4* 3.6  CL 115* 115* 114* 114* 114*  CO2 _0 GLUCOSE 99 118* 119* 113* 123*  BUN 22* 21* 18 25* 43*  CREATININE 1.33* 1.31*  1.13 1.21 1.16  CALCIUM 8.5* 8.2* 8.4* 9.2 9.1  MG 2.0  --   --   --   --   PHOS 3.7  --   --   --   --     GFR: Estimated Creatinine Clearance: 53.9 mL/min (by C-G formula based on SCr of 1.16 mg/dL).  Liver Function Tests: Recent Labs  Lab 01/06/18 0929  AST 43*  ALT 34  ALKPHOS 72  BILITOT 1.0  PROT 5.2*  ALBUMIN 2.5*   No results for input(s): LIPASE, AMYLASE in the last 168 hours. No results for input(s): AMMONIA in the last 168 hours.  Coagulation Profile: No results for input(s): INR, PROTIME in the last 168 hours.  Cardiac Enzymes: Recent Labs  Lab 01/03/18 1550 01/03/18 2148  TROPONINI 1.64* 1.16*  BNP (last 3 results) No results for input(s): PROBNP in the last 8760 hours.  HbA1C: No results for input(s): HGBA1C in the last 72 hours.  CBG: Recent Labs  Lab 01/09/18 2009 01/09/18 2354 01/10/18 0411 01/10/18 0836 01/10/18 1206  GLUCAP 114* 90 113* 113* 106*    Lipid Profile: No results for input(s): CHOL, HDL, LDLCALC, TRIG, CHOLHDL, LDLDIRECT in the last 72 hours.  Thyroid Function Tests: Recent Labs    01/09/18 0606  TSH 2.854    Anemia Panel: Recent Labs    01/09/18 0606  VITAMINB12 927*  FOLATE 32.0  FERRITIN 55  TIBC 169*  IRON 61  RETICCTPCT 0.5    Urine analysis:    Component Value Date/Time   COLORURINE YELLOW 01/08/2018 1622   APPEARANCEUR CLEAR 01/08/2018 1622   LABSPEC 1.009 01/08/2018 1622   PHURINE 7.0 01/08/2018 1622   GLUCOSEU NEGATIVE 01/08/2018 1622   HGBUR NEGATIVE 01/08/2018 1622   BILIRUBINUR NEGATIVE 01/08/2018 1622   KETONESUR NEGATIVE 01/08/2018 1622   PROTEINUR NEGATIVE 01/08/2018 1622   UROBILINOGEN 1.0 08/23/2015 1302   NITRITE NEGATIVE 01/08/2018 1622   LEUKOCYTESUR NEGATIVE 01/08/2018 1622    Sepsis Labs: Lactic Acid, Venous    Component Value Date/Time   LATICACIDVEN 1.6 01/03/2018 0710    MICROBIOLOGY: Recent Results (from the past 240 hour(s))  Culture, respiratory  (NON-Expectorated)     Status: None   Collection Time: 01/03/18 12:29 AM  Result Value Ref Range Status   Specimen Description   Final    TRACHEAL ASPIRATE Performed at [facility] [address] [place], [place] [address]    Special Requests   Final    NONE Performed at [facility], [place] [address], [place], [place] [address]    Gram Stain   Final    FEW WBC PRESENT, PREDOMINANTLY PMN RARE SQUAMOUS EPITHELIAL CELLS PRESENT ABUNDANT GRAM POSITIVE COCCI ABUNDANT GRAM NEGATIVE RODS RARE GRAM POSITIVE RODS    Culture   Final    Consistent with normal respiratory flora. Performed at [facility], [place] [address], [place], [place] [address]    Report Status 01/05/2018 FINAL  Final  Culture, blood (routine x 2)     Status: None   Collection Time: 01/03/18  3:50 AM  Result Value Ref Range Status   Specimen Description   Final    BLOOD RIGHT FOREARM Performed at [facility] [address], [place], [place] [address]    Special Requests   Final    IN PEDIATRIC BOTTLE Blood Culture adequate volume Performed at [place] [facility] [address] [place], [place] [address]    Culture   Final    NO GROWTH 5 DAYS Performed at [facility], [place] [address], [place], [place] [address]    Report Status 01/08/2018 FINAL  Final  Culture, blood (routine x 2)     Status: None   Collection Time: 01/03/18  3:50 AM  Result Value Ref Range Status   Specimen Description   Final    BLOOD RIGHT HAND Performed at [facility] [address] [place], [place] [address]    Special Requests   Final    IN PEDIATRIC BOTTLE Blood Culture adequate volume Performed at [facility] [address] [place], [place] [address]    Culture   Final    NO GROWTH 5 DAYS Performed at [facility], [place] [address] [place],  [address]  Report Status 01/08/2018 FINAL  Final   Culture, bal-quantitative     Status: Abnormal   Collection Time: 01/03/18 12:02 PM  Result Value Ref Range Status   Specimen Description   Final    BRONCHIAL ALVEOLAR LAVAGE Performed at [facility] [address], [place], [place] [address]    Special Requests   Final    Normal Performed at [facility], [place] [address], [place], [place] [address]    Gram Stain   Final    FEW WBC PRESENT,BOTH PMN AND MONONUCLEAR FEW SQUAMOUS EPITHELIAL CELLS PRESENT FEW GRAM POSITIVE COCCI IN CHAINS    Culture (A)  Final    >=100,000 COLONIES/mL Consistent with normal respiratory flora. Performed at [facility], [place] [address], [place], [place] [address]    Report Status 01/06/2018 FINAL  Final  Pneumocystis smear by DFA     Status: None   Collection Time: 01/03/18 12:02 PM  Result Value Ref Range Status   Specimen Source-PJSRC BRONCHIAL ALVEOLAR LAVAGE  Final   Pneumocystis jiroveci Ag NEGATIVE  Final    Comment: CALLED TO H.FRAIZER,RN 01/04/18 _0  BY V.WILKINS Performed at [facility], [place] [address], [place], [place] [address]   Acid Fast Smear (AFB)     Status: None   Collection Time: 01/03/18 12:03 PM  Result Value Ref Range Status   AFB Specimen Processing Concentration  Final   Acid Fast Smear Negative  Final    Comment: (NOTE) Performed At: [facility] [place], [place] [id number] Rush Farmer MD [id number]    Source (AFB) BRONCHIAL ALVEOLAR LAVAGE  Final    Comment: Performed at [facility] [address], [place], [place] [address]  Fungus Culture With Stain     Status: None (Preliminary result)   Collection Time: 01/03/18 12:03 PM  Result Value Ref Range Status   Fungus Stain Final report  Final    Comment: (NOTE) Performed At: [facility] [place], [place] [id number] Rush Farmer MD [id number]    Fungus (Mycology) Culture  PENDING  Incomplete   Fungal Source BRONCHIAL ALVEOLAR LAVAGE  Final    Comment: Performed at [facility], [place] [address], [place], [place] [address]  Fungus Culture Result     Status: None   Collection Time: 01/03/18 12:03 PM  Result Value Ref Range Status   Result 1 Comment  Final    Comment: (NOTE) Fungal elements, such as arthroconidia, hyphal fragments, chlamydoconidia, observed. Performed At: [facility] [place], [place] [id number] Rush Farmer MD [id number] Performed at [facility], [place] [address], [place], [place] [address]     RADIOLOGY STUDIES/RESULTS: Ct Head Wo Contrast  Result Date: 01/02/2018 CLINICAL DATA:  Altered level of consciousness and confusion. EXAM: CT HEAD WITHOUT CONTRAST TECHNIQUE: Contiguous axial images were obtained from the base of the skull through the vertex without intravenous contrast. COMPARISON:  09/10/2017 FINDINGS: Brain: Generalized atrophy. Chronic small-vessel ischemic changes throughout the white matter. No sign of acute infarction, mass lesion, hemorrhage, hydrocephalus or extra-axial collection. Vascular: No abnormal vascular finding. Skull: Normal Sinuses/Orbits: Clear/normal Other: None IMPRESSION: No acute finding. Chronic atrophy and small-vessel ischemic changes of the brain. Electronically Signed   By: Nelson Chimes M.D.   On: 01/02/2018 10:59   Ct Abdomen Pelvis W Contrast  Result Date: 12/16/2017 CLINICAL DATA:  Abdominal pain, nausea and vomiting, and abdominal distention. Newly diagnosed gastric carcinoma. Mantle  cell lymphoma. EXAM: CT ABDOMEN AND PELVIS WITH CONTRAST TECHNIQUE: Multidetector CT imaging of the abdomen and pelvis was performed using the standard protocol following bolus administration of intravenous contrast. CONTRAST:  157m ISOVUE-300 IOPAMIDOL (ISOVUE-300) INJECTION 61% COMPARISON:  PET-CT on 09/08/2017 FINDINGS: Lower Chest: Small bilateral pleural  effusions. Hepatobiliary: No hepatic masses identified. Gallbladder is unremarkable. Pancreas:  No mass or inflammatory changes. Spleen: Within normal limits in size and appearance. Adrenals/Urinary Tract: No masses identified. No evidence of hydronephrosis. Stomach/Bowel: Gastric soft tissue mass involving the distal body and antrum measures 5.7 x 3.5 cm on image 18/2, and appears increased in size since previous study. No evidence of gastric outlet or bowel obstruction, inflammatory process, or abnormal fluid collections. Vascular/Lymphatic: No pathologically enlarged lymph nodes. No abdominal aortic aneurysm. Aortic atherosclerosis. Reproductive:  No mass or other significant abnormality. Other:  None. Musculoskeletal:  No suspicious bone lesions identified. IMPRESSION: Increased size of 5.7 cm gastric mass, consistent with recently diagnosed gastric carcinoma. No evidence of metastatic disease. No evidence of gastric outlet obstruction. New small bilateral pleural effusions incidentally noted. Electronically Signed   By: JEarle GellM.D.   On: 12/16/2017 09:27   Dg Chest Port 1 View  Result Date: 01/08/2018 CLINICAL DATA:  Status post bronchoscopy EXAM: PORTABLE CHEST 1 VIEW COMPARISON:  Three days ago FINDINGS: Improved aeration in the lower lungs with better diaphragm visualization. There is no edema, consolidation, effusion, or pneumothorax. Chronic cardiomegaly and aortic tortuosity. Left IJ catheter has been removed. IMPRESSION: Continued improvement in lower lung aeration. Electronically Signed   By: JMonte FantasiaM.D.   On: 01/08/2018 09:46   Dg Chest Port 1 View  Result Date: 01/05/2018 CLINICAL DATA:  Pneumonia. EXAM: PORTABLE CHEST 1 VIEW COMPARISON:  Chest x-ray dated 01/03/2018. FINDINGS: Continued improvement in aeration at the right lung base, perhaps mild residual atelectasis. Probable additional mild atelectasis at the left lung base. Heart size and mediastinal contours are stable.  Endotracheal tube is been removed. Enteric tube has been removed. Left IJ central line is stable in position with tip at the level of the upper SVC. No pneumothorax seen. IMPRESSION: 1. Continued improvement in aeration at the right lung base. Probable mild residual atelectasis. Suspect additional mild atelectasis at the left lung base. 2. No new or developing opacity to suggest pneumonia. Electronically Signed   By: SFranki CabotM.D.   On: 01/05/2018 14:12   Dg Chest Port 1 View  Result Date: 01/03/2018 CLINICAL DATA:  S/p right lobe bronchoscopy EXAM: PORTABLE CHEST 1 VIEW COMPARISON:  Chest x-ray from earlier same day. Chest x-ray dated 01/02/2018. FINDINGS: Improved aeration at the right lung base, presumably resolving airspace collapse. Commensurate reduction of the rightward shift of the mediastinal structures. Endotracheal tube remains well positioned with tip approximately 2.5 cm above the carina. Enteric tube passes below the diaphragm. Left IJ central line is stable in position with tip at the level of the upper SVC. Heart size is stable.  No pneumothorax seen. IMPRESSION: 1. Improved aeration at the right lung base compared to the chest x-ray from earlier today, presumably resolving airspace collapse status post bronchoscopy. Commensurate reduction of the rightward shift of the mediastinal structures. 2. Support apparatus appears stable in position. Electronically Signed   By: SFranki CabotM.D.   On: 01/03/2018 12:32   Dg Chest Port 1 View  Result Date: 01/03/2018 CLINICAL DATA:  Central line placement. EXAM: PORTABLE CHEST 1 VIEW COMPARISON:  Chest radiograph performed 01/02/2018 FINDINGS: The patient's endotracheal  tube is seen ending 4-5 cm above the carina. The balloon of the endotracheal tube may be slightly overinflated. The left IJ line is noted ending about the proximal SVC. An enteric tube is noted extending below the diaphragm. There is right-sided volume loss, with apparent rightward  shift of the mediastinum. A small right pleural effusion is suspected. The left lung appears clear. No pneumothorax is seen. The cardiomediastinal silhouette is borderline normal in size. External pacing pads are seen. No acute osseous abnormalities are identified. IMPRESSION: 1. Left IJ line noted ending about the proximal SVC. 2. Endotracheal tube seen ending 4-5 cm above the carina. The balloon of the endotracheal tube may be slightly overinflated. 3. Right-sided volume loss, with apparent rightward shift of the mediastinum. Small right pleural effusion noted. Electronically Signed   By: Garald Balding M.D.   On: 01/03/2018 03:20   Portable Chest X-ray  Result Date: 01/02/2018 CLINICAL DATA:  84 y/o  M; intubation. EXAM: PORTABLE CHEST 1 VIEW COMPARISON:  01/02/2018 chest radiograph. FINDINGS: Patient is rotated. Stable cardiac silhouette given projection and technique. Transcutaneous pacing pads. Stable reticular opacities. Right lung base linear opacity, likely atelectasis. Improved aeration of left lung base. Bones are unremarkable. Endotracheal tube tip projects 5.9 cm above carina. Enteric tube tip is in the proximal stomach. IMPRESSION: Endotracheal tube tip projects 5.9 cm above carina. Enteric tube tip is in proximal stomach. Persistent reticular opacities, probably edema. Improved aeration of left lung base and new platelike atelectasis at right lung base. Electronically Signed   By: Kristine Garbe M.D.   On: 01/02/2018 21:14   Dg Chest Port 1 View  Result Date: 01/02/2018 CLINICAL DATA:  84 y/o  M; respiratory distress. EXAM: PORTABLE CHEST 1 VIEW COMPARISON:  01/02/2018 chest radiograph. FINDINGS: Stable cardiomegaly given projection and technique. Diffuse reticular opacities of the lungs. Persistent left lower lobe opacity. No pneumothorax. Bones are unremarkable. IMPRESSION: Increased reticular opacities of the lungs probably representing interstitial pulmonary edema. Persistent left  basilar opacity which may represent effusion and atelectasis, less likely pneumonia. Electronically Signed   By: Kristine Garbe M.D.   On: 01/02/2018 20:26   Dg Chest Port 1 View  Result Date: 01/02/2018 CLINICAL DATA:  Confusion altered mental status EXAM: PORTABLE CHEST 1 VIEW COMPARISON:  09/10/2017 FINDINGS: Heart size mildly enlarged. Negative for heart failure. Mild left lower lobe atelectasis/infiltrate. No effusion. Epidural anesthetic catheter overlies the right chest. IMPRESSION: Mild left lower lobe atelectasis/infiltrate. Cardiac enlargement without heart failure. Electronically Signed   By: Franchot Gallo M.D.   On: 01/02/2018 11:33   Dg Abd Portable 1v  Result Date: 12/31/2017 CLINICAL DATA:  NGT placement EXAM: PORTABLE ABDOMEN - 1 VIEW COMPARISON:  12/16/2017 CT FINDINGS: Tip and side port of a gastric tube are noted in the left upper quadrant of the abdomen presumably in the expected location of the stomach. Left-sided approach catheter is also noted with tip projecting over the lower lumbar spine at the level of L5. Midline skin staples are present as well as chain sutures in the left upper quadrant. Stool is noted within large bowel without bowel obstruction. No apparent free air. IMPRESSION: Gastric tube is seen in the left upper quadrant in the expected location of the stomach. Electronically Signed   By: Ashley Royalty M.D.   On: 12/31/2017 00:05   Dg Ugi W/high Density W/kub  Result Date: 01/08/2018 CLINICAL DATA:  Postop day 9 partial gastrectomy for cancer. Nausea. Vomiting. EXAM: WATER SOLUBLE UPPER GI SERIES TECHNIQUE:  Single-column upper GI series was performed using water soluble contrast. CONTRAST:  100 mL Isovue-300 orally COMPARISON:  12/30/2017, CT 12/16/2017 FLUOROSCOPY TIME:  Fluoroscopy Time:  3 minutes 0 seconds Radiation Exposure Index (if provided by the fluoroscopic device): Number of Acquired Spot Images: 0 FINDINGS: Preliminary KUB demonstrates normal bowel  gas pattern. Jejunostomy feeding tube is present in the left abdomen. Decreased esophageal motility. Mild esophageal dilatation. Contrast passed into the stomach which has been partially resected. Gastric jejunostomy is patent. No leak. No obstruction of the stomach. Jejunum is nondilated. Mild gastroesophageal reflux IMPRESSION: Postop partial gastrectomy with gastrojejunostomy. No leak or obstruction identified Decreased esophageal peristalsis.  Mild gastroesophageal reflux. Electronically Signed   By: Franchot Gallo M.D.   On: 01/08/2018 13:12     LOS: 11 days   Signature  Lala Lund M.D on 01/10/2018 at 1:24 PM  Between 7am to 7pm - Pager - [phone] ( page via [email or web address], text pages only, please mention full 10 digit call back number).  After 7pm go to [email or web address] - password [facility]

## 2018-01-10 NOTE — Progress Notes (Signed)
Pharmacy Antibiotic Note  [name]. is a 84 y.o. male admitted on 12/30/2017 with aspiration PNA.  Pharmacy has been consulted for Zosyn dosing.    Today, 01/10/2018:  D8 Zosyn (originally planned for 7 day course)  Afebrile  WBC WNL  Cx data negative  Scr continues to improve.  CrCl>61m/min.  Plan: Continue Zosyn 3.375g IV Q8H infused over 4hrs No dose adjustments anticipated-pharmacy will sign off & monitor peripherally. D/C or de-escalate antibiotic as soon as clinically appropriate  Height: 6' (182.9 cm) Weight: 179 lb 0.2 oz (81.2 kg) IBW/kg (Calculated) : 77.6  Temp (24hrs), Avg:97.9 F (36.6 C), Min:97.6 F (36.4 C), Max:98.3 F (36.8 C)  Recent Labs  Lab 01/03/18 0710 01/04/18 0449  01/06/18 0929 01/07/18 0545 01/08/18 0502 01/09/18 0606 01/10/18 0508  WBC 4.6 4.8  --   --  5.5 4.8 4.1 6.6  CREATININE 1.38* 1.37*   < > 1.33* 1.31* 1.13 1.21 1.16  LATICACIDVEN 1.6  --   --   --   --   --   --   --    < > = values in this interval not displayed.    Estimated Creatinine Clearance: 53.9 mL/min (by C-G formula based on SCr of 1.16 mg/dL).    Allergies  Allergen Reactions  . Niacin Hives   Antimicrobials this admission: 3/5 cefazolin pre and post-op 3/9 Zosyn >>  3/10 Vanc >> 3/11  Microbiology results:  3/5 MRSA PCR: negative 3/9 BCx: ngF 3/9 Trach asp: normal respiratory flora 3/9 BAL: >100k normal respiratory flora 3/9 BAL: PCP neg 3/9 BAL AFB: neg 3/9 BAL fungal: NGTD  Thank you for allowing pharmacy to be a part of this patient's care. MNetta Cedars PharmD, BCPS Pager: [phone]/16/2019, 7:25 AM

## 2018-01-11 DIAGNOSIS — B59 Pneumocystosis: Secondary | ICD-10-CM

## 2018-01-11 DIAGNOSIS — G43A1 Cyclical vomiting, intractable: Secondary | ICD-10-CM

## 2018-01-11 LAB — CBC
HEMATOCRIT: 27.6 % — AB (ref 39.0–52.0)
HEMOGLOBIN: 8.2 g/dL — AB (ref 13.0–17.0)
MCH: 29.4 pg (ref 26.0–34.0)
MCHC: 29.7 g/dL — ABNORMAL LOW (ref 30.0–36.0)
MCV: 98.9 fL (ref 78.0–100.0)
Platelets: 210 10*3/uL (ref 150–400)
RBC: 2.79 MIL/uL — ABNORMAL LOW (ref 4.22–5.81)
RDW: 17.8 % — AB (ref 11.5–15.5)
WBC: 5.8 10*3/uL (ref 4.0–10.5)

## 2018-01-11 LAB — BASIC METABOLIC PANEL
ANION GAP: 10 (ref 5–15)
BUN: 37 mg/dL — ABNORMAL HIGH (ref 6–20)
CALCIUM: 8.6 mg/dL — AB (ref 8.9–10.3)
CHLORIDE: 114 mmol/L — AB (ref 101–111)
CO2: 24 mmol/L (ref 22–32)
Creatinine, Ser: 1.12 mg/dL (ref 0.61–1.24)
GFR calc Af Amer: >60 mL/min (ref >60)
GFR calc non Af Amer: 59 mL/min — ABNORMAL LOW (ref >60)
Glucose, Bld: 116 mg/dL — ABNORMAL HIGH (ref 65–99)
POTASSIUM: 3.9 mmol/L (ref 3.5–5.1)
Sodium: 148 mmol/L — ABNORMAL HIGH (ref 135–145)

## 2018-01-11 LAB — GLUCOSE, CAPILLARY
Glucose-Capillary: 116 mg/dL — ABNORMAL HIGH (ref 65–99)
Glucose-Capillary: 110 mg/dL — ABNORMAL HIGH (ref 65–99)
Glucose-Capillary: 107 mg/dL — ABNORMAL HIGH (ref 65–99)
Glucose-Capillary: 122 mg/dL — ABNORMAL HIGH (ref 65–99)
Glucose-Capillary: 105 mg/dL — ABNORMAL HIGH (ref 65–99)

## 2018-01-11 MED ORDER — POTASSIUM CL IN DEXTROSE 5% 20 MEQ/L IV SOLN
20.0000 meq | INTRAVENOUS | Status: DC
  Administered 2018-01-11 – 2018-01-12 (×2): 20 meq via INTRAVENOUS
  Filled 2018-01-11 (×2): qty 1000

## 2018-01-11 NOTE — Progress Notes (Signed)
Patient ID: Jacob Bowens., male   DOB: 1933/05/14, 84 y.o.   MRN: [id number] [facility] Progress Note:   12 Days Post-Op  Subjective: Mental status is clear;  Up walking around with his family Objective: Vital signs in last 24 hours: Temp:  [97.5 F (36.4 C)-97.8 F (36.6 C)] 97.5 F (36.4 C) (03/17 0539) Pulse Rate:  [87-92] 92 (03/17 0539) Resp:  [16-18] 16 (03/17 0539) BP: (124-133)/(60-69) 124/60 (03/17 0539) SpO2:  [100 %] 100 % (03/17 0539) Weight:  [81 kg (178 lb 9.2 oz)] 81 kg (178 lb 9.2 oz) (03/17 0500)  Intake/Output from previous day: 03/16 0701 - 03/17 0700 In: 6055.8 [P.O.:320; I.V.:3675; NG/GT:1910.8; IV Piggyback:150] Out: 4098 [Urine:1650] Intake/Output this shift: Total I/O In: 180 [P.O.:180] Out: 600 [Urine:600]  Physical Exam: Work of breathing is normal.  Tolerating TF at 60 cc/hr.    Lab Results:  Results for orders placed or performed during the hospital encounter of 12/30/17 (from the past 48 hour(s))  Glucose, capillary     Status: Abnormal   Collection Time: 01/09/18  4:14 PM  Result Value Ref Range   Glucose-Capillary 102 (H) 65 - 99 mg/dL  Glucose, capillary     Status: Abnormal   Collection Time: 01/09/18  8:09 PM  Result Value Ref Range   Glucose-Capillary 114 (H) 65 - 99 mg/dL  Glucose, capillary     Status: None   Collection Time: 01/09/18 11:54 PM  Result Value Ref Range   Glucose-Capillary 90 65 - 99 mg/dL  Glucose, capillary     Status: Abnormal   Collection Time: 01/10/18  4:11 AM  Result Value Ref Range   Glucose-Capillary 113 (H) 65 - 99 mg/dL  CBC     Status: Abnormal   Collection Time: 01/10/18  5:08 AM  Result Value Ref Range   WBC 6.6 4.0 - 10.5 K/uL   RBC 2.97 (L) 4.22 - 5.81 MIL/uL   Hemoglobin 8.6 (L) 13.0 - 17.0 g/dL   HCT 28.7 (L) 39.0 - 52.0 %   MCV 96.6 78.0 - 100.0 fL   MCH 29.0 26.0 - 34.0 pg   MCHC 30.0 30.0 - 36.0 g/dL   RDW 17.3 (H) 11.5 - 15.5 %   Platelets 211 150 - 400 K/uL   Comment: Performed at [facility], [place] [address] [place], [place] [address]  Basic metabolic panel     Status: Abnormal   Collection Time: 01/10/18  5:08 AM  Result Value Ref Range   Sodium 152 (H) 135 - 145 mmol/L   Potassium 3.6 3.5 - 5.1 mmol/L   Chloride 114 (H) 101 - 111 mmol/L   CO2 25 22 - 32 mmol/L   Glucose, Bld 123 (H) 65 - 99 mg/dL   BUN 43 (H) 6 - 20 mg/dL   Creatinine, Ser 1.16 0.61 - 1.24 mg/dL   Calcium 9.1 8.9 - 10.3 mg/dL   GFR calc non Af Amer 57 (L) >60 mL/min   GFR calc Af Amer >60 >60 mL/min    Comment: (NOTE) The eGFR has been calculated using the CKD EPI equation. This calculation has not been validated in all clinical situations. eGFR's persistently <60 mL/min signify possible Chronic Kidney Disease.    Anion gap 13 5 - 15    Comment: Performed at [facility], [place] [address], [place], [place] [address]  Glucose, capillary     Status: Abnormal   Collection Time: 01/10/18  8:36 AM  Result Value  Ref Range   Glucose-Capillary 113 (H) 65 - 99 mg/dL  Glucose, capillary     Status: Abnormal   Collection Time: 01/10/18 12:06 PM  Result Value Ref Range   Glucose-Capillary 106 (H) 65 - 99 mg/dL  Glucose, capillary     Status: None   Collection Time: 01/10/18  7:47 PM  Result Value Ref Range   Glucose-Capillary 91 65 - 99 mg/dL  Glucose, capillary     Status: Abnormal   Collection Time: 01/10/18 11:42 PM  Result Value Ref Range   Glucose-Capillary 104 (H) 65 - 99 mg/dL  Glucose, capillary     Status: Abnormal   Collection Time: 01/11/18  4:09 AM  Result Value Ref Range   Glucose-Capillary 116 (H) 65 - 99 mg/dL  CBC     Status: Abnormal   Collection Time: 01/11/18  4:28 AM  Result Value Ref Range   WBC 5.8 4.0 - 10.5 K/uL   RBC 2.79 (L) 4.22 - 5.81 MIL/uL   Hemoglobin 8.2 (L) 13.0 - 17.0 g/dL   HCT 27.6 (L) 39.0 - 52.0 %   MCV 98.9 78.0 - 100.0 fL   MCH 29.4 26.0 - 34.0 pg   MCHC 29.7 (L) 30.0 - 36.0 g/dL   RDW  17.8 (H) 11.5 - 15.5 %   Platelets 210 150 - 400 K/uL    Comment: Performed at [facility], [place] [address] [place], [place] [address]  Basic metabolic panel     Status: Abnormal   Collection Time: 01/11/18  4:28 AM  Result Value Ref Range   Sodium 148 (H) 135 - 145 mmol/L   Potassium 3.9 3.5 - 5.1 mmol/L   Chloride 114 (H) 101 - 111 mmol/L   CO2 24 22 - 32 mmol/L   Glucose, Bld 116 (H) 65 - 99 mg/dL   BUN 37 (H) 6 - 20 mg/dL   Creatinine, Ser 1.12 0.61 - 1.24 mg/dL   Calcium 8.6 (L) 8.9 - 10.3 mg/dL   GFR calc non Af Amer 59 (L) >60 mL/min   GFR calc Af Amer >60 >60 mL/min    Comment: (NOTE) The eGFR has been calculated using the CKD EPI equation. This calculation has not been validated in all clinical situations. eGFR's persistently <60 mL/min signify possible Chronic Kidney Disease.    Anion gap 10 5 - 15    Comment: Performed at [facility], [place] [address] [place], [place] [address]  Glucose, capillary     Status: Abnormal   Collection Time: 01/11/18  7:55 AM  Result Value Ref Range   Glucose-Capillary 110 (H) 65 - 99 mg/dL  Glucose, capillary     Status: Abnormal   Collection Time: 01/11/18 12:20 PM  Result Value Ref Range   Glucose-Capillary 107 (H) 65 - 99 mg/dL    Radiology/Results: No results found.  Anti-infectives: Anti-infectives (From admission, onward)   Start     Dose/Rate Route Frequency Ordered Stop   01/05/18 1000  vancomycin (VANCOCIN) IVPB 1000 mg/200 mL premix  Status:  Discontinued     1,000 mg 200 mL/hr over 60 Minutes Intravenous Every 24 hours 01/04/18 0956 01/05/18 1319   01/04/18 1030  vancomycin (VANCOCIN) 1,500 mg in sodium chloride 0.9 % 500 mL IVPB     1,500 mg 250 mL/hr over 120 Minutes Intravenous  Once 01/04/18 0956 01/04/18 1314   01/03/18 0200  piperacillin-tazobactam (ZOSYN) IVPB 3.375 g  Status:  Discontinued     3.375 g 12.5 mL/hr  over 240 Minutes Intravenous Every 8 hours 01/03/18 0112  01/11/18 1002   12/30/17 1714  ceFAZolin (ANCEF) 2-4 GM/100ML-% IVPB    Comments:  Otelia Sergeant   : cabinet override      12/30/17 1714 12/31/17 0529   12/30/17 1600  ceFAZolin (ANCEF) IVPB 2g/100 mL premix     2 g 200 mL/hr over 30 Minutes Intravenous Every 8 hours 12/30/17 1356 12/30/17 1747   12/30/17 0545  ceFAZolin (ANCEF) 2-4 GM/100ML-% IVPB    Comments:  Waldron Session   : cabinet override      12/30/17 0545 12/30/17 0756   12/30/17 0542  ceFAZolin (ANCEF) IVPB 2g/100 mL premix     2 g 200 mL/hr over 30 Minutes Intravenous On call to O.R. 12/30/17 0542 12/30/17 0826      Assessment/Plan: Problem List: Patient Active Problem List   Diagnosis Date Noted  . HCAP (healthcare-associated pneumonia)   . Acute respiratory failure ([place])   . Gastric cancer ([place]) 12/30/2017  . Malignant neoplasm of body of stomach ([place]) 12/04/2017  . Counseling regarding advanced care planning and goals of care 08/07/2017  . Drug-induced neutropenia ([place]) 06/26/2017  . Protein-calorie malnutrition, severe ([place])   . SOB (shortness of breath)   . Hypokalemia 03/17/2017  . Hypernatremia 03/17/2017  . AKI (acute kidney injury) ([place]) 03/17/2017  . Dehydration 03/17/2017  . Thrush, oral 03/17/2017  . Mantle cell lymphoma of lymph nodes of multiple regions ([place]) 02/24/2017  . 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 GI bleed 02/03/2017  . Lower GI bleed 02/03/2017  . Coronary artery disease   . IFG (impaired fasting glucose) 10/03/2015  . PMR (polymyalgia rheumatica) ([facility]) 09/07/2015  . Orthostatic hypotension 08/24/2015  . Viral syndrome 08/24/2015  . GERD (gastroesophageal reflux disease) 03/24/2014  . ACP (advance care planning) 11/02/2013  . Left upper arm pain 09/15/2012  . Routine health maintenance 10/29/2011  . HEARING LOSS 11/05/2010  . Chest pain 11/06/2009  . SKIN CANCER, RECURRENT 10/13/2007  . Hyperlipidemia 10/13/2007  . Gout 10/13/2007  .  Essential hypertension 10/13/2007  . PVC (premature ventricular contraction) 10/13/2007  . PSORIASIS 10/13/2007  . OSTEOARTHRITIS, ANKLE, RIGHT 10/13/2007  . Other acquired absence of organ 10/13/2007    Doing better with no further emesis 12 Days Post-Op    LOS: 12 days   Matt B. Hassell Done, MD, [facility] [phone] beeper [phone]  01/11/2018 12:52 PM

## 2018-01-11 NOTE — Progress Notes (Signed)
Hospitalist consult Progress Note        PATIENT DETAILS Name: [name]. Age: 84 y.o. Sex: male Date of Birth: [birth year] Admit Date: 12/30/2017 Admitting Physician Stark Klein, MD BEM:[id number], Alinda Sierras, MD  Brief Narrative: Patient is a 84 y.o. male who underwent diagnostic laparoscopy, open distal gastrectomy with Billroth II anastomosis and placement of a jejunostomy tube on 3/5 due to adenocarcinoma of the stomach-subsequent hospital course complicated by confusion, and subsequently developed hypotension and respiratory failure due to right lower lobe infiltrate/collapse due to mucous plugging requiring intubation.  Patient was provided supportive care, and subsequently improved and transferred back to a medical floor.  Hospitalist service was consulted on 3/14 for assistant and ongoing intermittent confusion.  Subjective: Patient in bed, appears comfortable, denies any headache, no fever, no chest pain or pressure, no shortness of breath , no abdominal pain. No focal weakness.   Assessment/Plan:  Acute metabolic encephalopathy/delirium: Combination of hyponatremia, dehydration along with required daily in the setting of acute illness.  CT head unremarkable, he has no headache or focal deficits, now appears to be close to his baseline, continue supportive care, minimize narcotics and benzodiazepines, expose to sunlight, encourage sitting up in chair and monitor.  Hypernatremia: Dehydration have placed him on D5W with good effect continue free water through PEG tube, repeat BMP and magnesium levels tomorrow.  Combined chronic systolic/diastolic heart failure: Currently dehydrated diuretics on hold monitor closely.  Aspiration pneumonia: Remains on Zosyn-which apparently was started on 3/9- stopped 01/11/2018 and monitor  Gastric cancer status post diagnostic laparoscopy with open distal gastrectomy with Billroth II-placement of a jejunostomy tube:  Postop issues-deferred to the primary service.    DVT Prophylaxis: Prophylactic Lovenox   Code Status: Full code   Family Communication: None at bedside  Disposition Plan: Per primary service  Antimicrobial agents: Anti-infectives (From admission, onward)   Start     Dose/Rate Route Frequency Ordered Stop   01/05/18 1000  vancomycin (VANCOCIN) IVPB 1000 mg/200 mL premix  Status:  Discontinued     1,000 mg 200 mL/hr over 60 Minutes Intravenous Every 24 hours 01/04/18 0956 01/05/18 1319   01/04/18 1030  vancomycin (VANCOCIN) 1,500 mg in sodium chloride 0.9 % 500 mL IVPB     1,500 mg 250 mL/hr over 120 Minutes Intravenous  Once 01/04/18 0956 01/04/18 1314   01/03/18 0200  piperacillin-tazobactam (ZOSYN) IVPB 3.375 g     3.375 g 12.5 mL/hr over 240 Minutes Intravenous Every 8 hours 01/03/18 0112     12/30/17 1714  ceFAZolin (ANCEF) 2-4 GM/100ML-% IVPB    Comments:  Otelia Sergeant   : cabinet override      12/30/17 1714 12/31/17 0529   12/30/17 1600  ceFAZolin (ANCEF) IVPB 2g/100 mL premix     2 g 200 mL/hr over 30 Minutes Intravenous Every 8 hours 12/30/17 1356 12/30/17 1747   12/30/17 0545  ceFAZolin (ANCEF) 2-4 GM/100ML-% IVPB    Comments:  Waldron Session   : cabinet override      12/30/17 0545 12/30/17 0756   12/30/17 0542  ceFAZolin (ANCEF) IVPB 2g/100 mL premix     2 g 200 mL/hr over 30 Minutes Intravenous On call to O.R. 12/30/17 0542 12/30/17 0826     Time spent: 25  minutes-Greater than 50% of this time was spent in counseling, explanation of diagnosis, planning of  further management, and coordination of care.  MEDICATIONS: Scheduled Meds: . aspirin EC  81 mg Oral Daily  . carvedilol  6.25 mg Per Tube BID WC  . Chlorhexidine Gluconate Cloth  6 each Topical Daily  . enoxaparin (LOVENOX) injection  40 mg Subcutaneous Q24H  . feeding supplement  1 Container Oral BID BM  . free water  250 mL Per Tube Q8H  . pantoprazole  40 mg Oral Q1200  . potassium chloride   40 mEq Per Tube Daily   Continuous Infusions: . dextrose 5 % with KCl 20 mEq / L 20 mEq (01/11/18 0849)  . feeding supplement (OSMOLITE 1.2 CAL) 1,000 mL (01/10/18 1852)  . piperacillin-tazobactam (ZOSYN)  IV Stopped (01/11/18 0556)   PRN Meds:.acetaminophen (TYLENOL) oral liquid 160 mg/5 mL, diphenhydrAMINE, haloperidol lactate, hydrALAZINE, iopamidol, nitroGLYCERIN, ondansetron **OR** ondansetron (ZOFRAN) IV, prochlorperazine   PHYSICAL EXAM: Vital signs: Vitals:   01/10/18 1355 01/10/18 2134 01/11/18 0500 01/11/18 0539  BP: 133/68 127/69  124/60  Pulse: 88 87  92  Resp: _0 Temp: 97.8 F (36.6 C) (!) 97.5 F (36.4 C)  (!) 97.5 F (36.4 C)  TempSrc: Oral Oral  Oral  SpO2: 100% 100%  100%  Weight:   81 kg (178 lb 9.2 oz)   Height:       Filed Weights   01/10/18 0054 01/10/18 0500 01/11/18 0500  Weight: 81.2 kg (179 lb 0.2 oz) 81.2 kg (179 lb 0.2 oz) 81 kg (178 lb 9.2 oz)   Body mass index is 24.22 kg/m.   Exam  Awake Alert, No new F.N deficits, Normal affect [place].AT,PERRAL Supple Neck,No JVD, No cervical lymphadenopathy appriciated.  Symmetrical Chest wall movement, Good air movement bilaterally, CTAB RRR,No Gallops, Rubs or new Murmurs, No Parasternal Heave +ve B.Sounds, Abd Soft, No tenderness, No organomegaly appriciated, No rebound - guarding or rigidity. PEG tube is in place, midline abdominal incision site stable  No Cyanosis, Clubbing or edema, No new Rash or bruise    I have personally reviewed following labs and imaging studies  LABORATORY DATA: CBC: Recent Labs  Lab 01/07/18 0545 01/08/18 0502 01/09/18 0606 01/10/18 0508 01/11/18 0428  WBC 5.5 4.8 4.1 6.6 5.8  HGB 9.6* 9.1* 8.8* 8.6* 8.2*  HCT 31.2* 28.9* 28.6* 28.7* 27.6*  MCV 95.4 95.7 96.6 96.6 98.9  PLT 158 170 181 211 314    Basic Metabolic Panel: Recent Labs  Lab 01/06/18 0929 01/07/18 0545 01/08/18 0502 01/09/18 0606 01/10/18 0508 01/11/18 0428  NA 150* 152* 150* 154* 152*  148*  K 3.3* 3.1* 3.2* 3.4* 3.6 3.9  CL 115* 115* 114* 114* 114* 114*  CO2 _1 GLUCOSE 99 118* 119* 113* 123* 116*  BUN 22* 21* 18 25* 43* 37*  CREATININE 1.33* 1.31* 1.13 1.21 1.16 1.12  CALCIUM 8.5* 8.2* 8.4* 9.2 9.1 8.6*  MG 2.0  --   --   --   --   --   PHOS 3.7  --   --   --   --   --     GFR: Estimated Creatinine Clearance: 55.8 mL/min (by C-G formula based on SCr of 1.12 mg/dL).  Liver Function Tests: Recent Labs  Lab 01/06/18 0929  AST 43*  ALT 34  ALKPHOS 72  BILITOT 1.0  PROT 5.2*  ALBUMIN 2.5*   No results for input(s): LIPASE, AMYLASE in the last 168 hours. No results for input(s): AMMONIA in the last 168 hours.  Coagulation Profile: No results for input(s): INR, PROTIME in the last 168 hours.  Cardiac Enzymes: No results for input(s): CKTOTAL, CKMB, CKMBINDEX, TROPONINI in the last 168 hours.  BNP (last 3 results) No results for input(s): PROBNP in the last 8760 hours.  HbA1C: No results for input(s): HGBA1C in the last 72 hours.  CBG: Recent Labs  Lab 01/10/18 1206 01/10/18 1947 01/10/18 2342 01/11/18 0409 01/11/18 0755  GLUCAP 106* 91 104* 116* 110*    Lipid Profile: No results for input(s): CHOL, HDL, LDLCALC, TRIG, CHOLHDL, LDLDIRECT in the last 72 hours.  Thyroid Function Tests: Recent Labs    01/09/18 0606  TSH 2.854    Anemia Panel: Recent Labs    01/09/18 0606  VITAMINB12 927*  FOLATE 32.0  FERRITIN 55  TIBC 169*  IRON 61  RETICCTPCT 0.5    Urine analysis:    Component Value Date/Time   COLORURINE YELLOW 01/08/2018 1622   APPEARANCEUR CLEAR 01/08/2018 1622   LABSPEC 1.009 01/08/2018 1622   PHURINE 7.0 01/08/2018 1622   GLUCOSEU NEGATIVE 01/08/2018 1622   HGBUR NEGATIVE 01/08/2018 1622   BILIRUBINUR NEGATIVE 01/08/2018 1622   KETONESUR NEGATIVE 01/08/2018 1622   PROTEINUR NEGATIVE 01/08/2018 1622   UROBILINOGEN 1.0 08/23/2015 1302   NITRITE NEGATIVE 01/08/2018 1622   LEUKOCYTESUR NEGATIVE  01/08/2018 1622    Sepsis Labs: Lactic Acid, Venous    Component Value Date/Time   LATICACIDVEN 1.6 01/03/2018 0710    MICROBIOLOGY: Recent Results (from the past 240 hour(s))  Culture, respiratory (NON-Expectorated)     Status: None   Collection Time: 01/03/18 12:29 AM  Result Value Ref Range Status   Specimen Description   Final    TRACHEAL ASPIRATE Performed at [facility] [address] [place], [place] [address]    Special Requests   Final    NONE Performed at [facility], [place] [address] [place], [place] [address]    Gram Stain   Final    FEW WBC PRESENT, PREDOMINANTLY PMN RARE SQUAMOUS EPITHELIAL CELLS PRESENT ABUNDANT GRAM POSITIVE COCCI ABUNDANT GRAM NEGATIVE RODS RARE GRAM POSITIVE RODS    Culture   Final    Consistent with normal respiratory flora. Performed at [facility], [place] [address] [place], [place] [address]    Report Status 01/05/2018 FINAL  Final  Culture, blood (routine x 2)     Status: None   Collection Time: 01/03/18  3:50 AM  Result Value Ref Range Status   Specimen Description   Final    BLOOD RIGHT FOREARM Performed at [facility] [address], [place], [place] [address]    Special Requests   Final    IN PEDIATRIC BOTTLE Blood Culture adequate volume Performed at [facility] [address] [name], [place] [address]    Culture   Final    NO GROWTH 5 DAYS Performed at [facility], [place] [address], [place], [place] [address]    Report Status 01/08/2018 FINAL  Final  Culture, blood (routine x 2)     Status: None   Collection Time: 01/03/18  3:50 AM  Result Value Ref Range Status   Specimen Description   Final    BLOOD RIGHT HAND Performed at [facility] [address] [place], [place] [address]    Special Requests   Final    IN PEDIATRIC BOTTLE Blood Culture adequate volume Performed at [facility] [place]  [address] [place], [place] [address]    Culture   Final    NO GROWTH 5 DAYS Performed at [facility], [place] [address], [place], [place] [address]    Report Status 01/08/2018 FINAL  Final  Culture, bal-quantitative     Status: Abnormal   Collection Time: 01/03/18 12:02 PM  Result Value Ref Range Status   Specimen Description   Final    BRONCHIAL ALVEOLAR LAVAGE Performed at [facility] [address], [place], [place] [address]    Special Requests   Final    Normal Performed at [facility], [place] [address], [place], [place] [address]    Gram Stain   Final    FEW WBC PRESENT,BOTH PMN AND MONONUCLEAR FEW SQUAMOUS EPITHELIAL CELLS PRESENT FEW GRAM POSITIVE COCCI IN CHAINS    Culture (A)  Final    >=100,000 COLONIES/mL Consistent with normal respiratory flora. Performed at [facility], [place] [address], [place], [place] [address]    Report Status 01/06/2018 FINAL  Final  Pneumocystis smear by DFA     Status: None   Collection Time: 01/03/18 12:02 PM  Result Value Ref Range Status   Specimen Source-PJSRC BRONCHIAL ALVEOLAR LAVAGE  Final   Pneumocystis jiroveci Ag NEGATIVE  Final    Comment: CALLED TO H.FRAIZER,RN 01/04/18 _0  BY V.WILKINS Performed at [facility], [place] [address], [place], [place] [address]   Acid Fast Smear (AFB)     Status: None   Collection Time: 01/03/18 12:03 PM  Result Value Ref Range Status   AFB Specimen Processing Concentration  Final   Acid Fast Smear Negative  Final    Comment: (NOTE) Performed At: [facility] [place], [place] [id number] Rush Farmer MD VO:[id number]    Source (AFB) BRONCHIAL ALVEOLAR LAVAGE  Final    Comment: Performed at [facility] [address], [place], [place] [address]  Fungus Culture With Stain     Status: None   Collection Time: 01/03/18 12:03 PM  Result Value Ref Range Status    Fungus Stain Final report  Final   Fungus (Mycology) Culture Preliminary report  Final    Comment: (NOTE) Performed At: [facility] [place], [place] [id number] Rush Farmer MD IR:[id number]    Fungal Source BRONCHIAL ALVEOLAR LAVAGE  Final    Comment: Performed at [facility], [place] [address], [place], [place] [address]  Fungus Culture Result     Status: None   Collection Time: 01/03/18 12:03 PM  Result Value Ref Range Status   Result 1 Comment  Final    Comment: (NOTE) Fungal elements, such as arthroconidia, hyphal fragments, chlamydoconidia, observed. Performed At: [facility] [place], [place] [id number] Rush Farmer MD [id number] Performed at [facility], [place] [address], [place], [place] [address]   Fungal organism reflex     Status: None   Collection Time: 01/03/18 12:03 PM  Result Value Ref Range Status   Fungal result 1 Candida albicans  Final    Comment: (NOTE) Performed At: [facility] [place], [place] [id number] Rush Farmer MD VP:[id number] Performed at [facility], [place] [address], [place],  [address]     RADIOLOGY STUDIES/RESULTS: Ct Head Wo Contrast  Result Date: 01/02/2018 CLINICAL DATA:  Altered level of consciousness and confusion. EXAM: CT HEAD WITHOUT CONTRAST TECHNIQUE: Contiguous axial images were obtained from the base of the skull  through the vertex without intravenous contrast. COMPARISON:  09/10/2017 FINDINGS: Brain: Generalized atrophy. Chronic small-vessel ischemic changes throughout the white matter. No sign of acute infarction, mass lesion, hemorrhage, hydrocephalus or extra-axial collection. Vascular: No abnormal vascular finding. Skull: Normal Sinuses/Orbits: Clear/normal Other: None IMPRESSION: No acute finding. Chronic atrophy and small-vessel ischemic changes of the brain. Electronically Signed   By:  Nelson Chimes M.D.   On: 01/02/2018 10:59   Ct Abdomen Pelvis W Contrast  Result Date: 12/16/2017 CLINICAL DATA:  Abdominal pain, nausea and vomiting, and abdominal distention. Newly diagnosed gastric carcinoma. Mantle cell lymphoma. EXAM: CT ABDOMEN AND PELVIS WITH CONTRAST TECHNIQUE: Multidetector CT imaging of the abdomen and pelvis was performed using the standard protocol following bolus administration of intravenous contrast. CONTRAST:  169m ISOVUE-300 IOPAMIDOL (ISOVUE-300) INJECTION 61% COMPARISON:  PET-CT on 09/08/2017 FINDINGS: Lower Chest: Small bilateral pleural effusions. Hepatobiliary: No hepatic masses identified. Gallbladder is unremarkable. Pancreas:  No mass or inflammatory changes. Spleen: Within normal limits in size and appearance. Adrenals/Urinary Tract: No masses identified. No evidence of hydronephrosis. Stomach/Bowel: Gastric soft tissue mass involving the distal body and antrum measures 5.7 x 3.5 cm on image 18/2, and appears increased in size since previous study. No evidence of gastric outlet or bowel obstruction, inflammatory process, or abnormal fluid collections. Vascular/Lymphatic: No pathologically enlarged lymph nodes. No abdominal aortic aneurysm. Aortic atherosclerosis. Reproductive:  No mass or other significant abnormality. Other:  None. Musculoskeletal:  No suspicious bone lesions identified. IMPRESSION: Increased size of 5.7 cm gastric mass, consistent with recently diagnosed gastric carcinoma. No evidence of metastatic disease. No evidence of gastric outlet obstruction. New small bilateral pleural effusions incidentally noted. Electronically Signed   By: JEarle GellM.D.   On: 12/16/2017 09:27   Dg Chest Port 1 View  Result Date: 01/08/2018 CLINICAL DATA:  Status post bronchoscopy EXAM: PORTABLE CHEST 1 VIEW COMPARISON:  Three days ago FINDINGS: Improved aeration in the lower lungs with better diaphragm visualization. There is no edema, consolidation, effusion, or  pneumothorax. Chronic cardiomegaly and aortic tortuosity. Left IJ catheter has been removed. IMPRESSION: Continued improvement in lower lung aeration. Electronically Signed   By: JMonte FantasiaM.D.   On: 01/08/2018 09:46   Dg Chest Port 1 View  Result Date: 01/05/2018 CLINICAL DATA:  Pneumonia. EXAM: PORTABLE CHEST 1 VIEW COMPARISON:  Chest x-ray dated 01/03/2018. FINDINGS: Continued improvement in aeration at the right lung base, perhaps mild residual atelectasis. Probable additional mild atelectasis at the left lung base. Heart size and mediastinal contours are stable. Endotracheal tube is been removed. Enteric tube has been removed. Left IJ central line is stable in position with tip at the level of the upper SVC. No pneumothorax seen. IMPRESSION: 1. Continued improvement in aeration at the right lung base. Probable mild residual atelectasis. Suspect additional mild atelectasis at the left lung base. 2. No new or developing opacity to suggest pneumonia. Electronically Signed   By: SFranki CabotM.D.   On: 01/05/2018 14:12   Dg Chest Port 1 View  Result Date: 01/03/2018 CLINICAL DATA:  S/p right lobe bronchoscopy EXAM: PORTABLE CHEST 1 VIEW COMPARISON:  Chest x-ray from earlier same day. Chest x-ray dated 01/02/2018. FINDINGS: Improved aeration at the right lung base, presumably resolving airspace collapse. Commensurate reduction of the rightward shift of the mediastinal structures. Endotracheal tube remains well positioned with tip approximately 2.5 cm above the carina. Enteric tube passes below the diaphragm. Left IJ central line is stable in position with tip at the level of  the upper SVC. Heart size is stable.  No pneumothorax seen. IMPRESSION: 1. Improved aeration at the right lung base compared to the chest x-ray from earlier today, presumably resolving airspace collapse status post bronchoscopy. Commensurate reduction of the rightward shift of the mediastinal structures. 2. Support apparatus  appears stable in position. Electronically Signed   By: Franki Cabot M.D.   On: 01/03/2018 12:32   Dg Chest Port 1 View  Result Date: 01/03/2018 CLINICAL DATA:  Central line placement. EXAM: PORTABLE CHEST 1 VIEW COMPARISON:  Chest radiograph performed 01/02/2018 FINDINGS: The patient's endotracheal tube is seen ending 4-5 cm above the carina. The balloon of the endotracheal tube may be slightly overinflated. The left IJ line is noted ending about the proximal SVC. An enteric tube is noted extending below the diaphragm. There is right-sided volume loss, with apparent rightward shift of the mediastinum. A small right pleural effusion is suspected. The left lung appears clear. No pneumothorax is seen. The cardiomediastinal silhouette is borderline normal in size. External pacing pads are seen. No acute osseous abnormalities are identified. IMPRESSION: 1. Left IJ line noted ending about the proximal SVC. 2. Endotracheal tube seen ending 4-5 cm above the carina. The balloon of the endotracheal tube may be slightly overinflated. 3. Right-sided volume loss, with apparent rightward shift of the mediastinum. Small right pleural effusion noted. Electronically Signed   By: Garald Balding M.D.   On: 01/03/2018 03:20   Portable Chest X-ray  Result Date: 01/02/2018 CLINICAL DATA:  84 y/o  M; intubation. EXAM: PORTABLE CHEST 1 VIEW COMPARISON:  01/02/2018 chest radiograph. FINDINGS: Patient is rotated. Stable cardiac silhouette given projection and technique. Transcutaneous pacing pads. Stable reticular opacities. Right lung base linear opacity, likely atelectasis. Improved aeration of left lung base. Bones are unremarkable. Endotracheal tube tip projects 5.9 cm above carina. Enteric tube tip is in the proximal stomach. IMPRESSION: Endotracheal tube tip projects 5.9 cm above carina. Enteric tube tip is in proximal stomach. Persistent reticular opacities, probably edema. Improved aeration of left lung base and new platelike  atelectasis at right lung base. Electronically Signed   By: Kristine Garbe M.D.   On: 01/02/2018 21:14   Dg Chest Port 1 View  Result Date: 01/02/2018 CLINICAL DATA:  84 y/o  M; respiratory distress. EXAM: PORTABLE CHEST 1 VIEW COMPARISON:  01/02/2018 chest radiograph. FINDINGS: Stable cardiomegaly given projection and technique. Diffuse reticular opacities of the lungs. Persistent left lower lobe opacity. No pneumothorax. Bones are unremarkable. IMPRESSION: Increased reticular opacities of the lungs probably representing interstitial pulmonary edema. Persistent left basilar opacity which may represent effusion and atelectasis, less likely pneumonia. Electronically Signed   By: Kristine Garbe M.D.   On: 01/02/2018 20:26   Dg Chest Port 1 View  Result Date: 01/02/2018 CLINICAL DATA:  Confusion altered mental status EXAM: PORTABLE CHEST 1 VIEW COMPARISON:  09/10/2017 FINDINGS: Heart size mildly enlarged. Negative for heart failure. Mild left lower lobe atelectasis/infiltrate. No effusion. Epidural anesthetic catheter overlies the right chest. IMPRESSION: Mild left lower lobe atelectasis/infiltrate. Cardiac enlargement without heart failure. Electronically Signed   By: Franchot Gallo M.D.   On: 01/02/2018 11:33   Dg Abd Portable 1v  Result Date: 12/31/2017 CLINICAL DATA:  NGT placement EXAM: PORTABLE ABDOMEN - 1 VIEW COMPARISON:  12/16/2017 CT FINDINGS: Tip and side port of a gastric tube are noted in the left upper quadrant of the abdomen presumably in the expected location of the stomach. Left-sided approach catheter is also noted with tip projecting over  the lower lumbar spine at the level of L5. Midline skin staples are present as well as chain sutures in the left upper quadrant. Stool is noted within large bowel without bowel obstruction. No apparent free air. IMPRESSION: Gastric tube is seen in the left upper quadrant in the expected location of the stomach. Electronically Signed    By: Ashley Royalty M.D.   On: 12/31/2017 00:05   Dg Ugi W/high Density W/kub  Result Date: 01/08/2018 CLINICAL DATA:  Postop day 9 partial gastrectomy for cancer. Nausea. Vomiting. EXAM: WATER SOLUBLE UPPER GI SERIES TECHNIQUE: Single-column upper GI series was performed using water soluble contrast. CONTRAST:  100 mL Isovue-300 orally COMPARISON:  12/30/2017, CT 12/16/2017 FLUOROSCOPY TIME:  Fluoroscopy Time:  3 minutes 0 seconds Radiation Exposure Index (if provided by the fluoroscopic device): Number of Acquired Spot Images: 0 FINDINGS: Preliminary KUB demonstrates normal bowel gas pattern. Jejunostomy feeding tube is present in the left abdomen. Decreased esophageal motility. Mild esophageal dilatation. Contrast passed into the stomach which has been partially resected. Gastric jejunostomy is patent. No leak. No obstruction of the stomach. Jejunum is nondilated. Mild gastroesophageal reflux IMPRESSION: Postop partial gastrectomy with gastrojejunostomy. No leak or obstruction identified Decreased esophageal peristalsis.  Mild gastroesophageal reflux. Electronically Signed   By: Franchot Gallo M.D.   On: 01/08/2018 13:12     LOS: 12 days   Signature  Lala Lund M.D on 01/11/2018 at 9:58 AM  Between 7am to 7pm - Pager - [phone] ( page via [email or web address], text pages only, please mention full 10 digit call back number).  After 7pm go to [email or web address] - password [facility]

## 2018-01-11 NOTE — Social Work (Addendum)
CSW met with patient and daughter at bedside to discuss SNF offers. Pt/daughter indicated that they already spoken to another CSW and have accepted SNF bed offer from [facility]. CSW validated their feelings.  Daughter indicated that she may visit SNF and f/u with facility on her additional questions about PT.  CSW will f/u for disposition.  2:00pm CSW went to see patient/spouse and family again per request of tech and floor RN. CSW met with them at bedside, and spouse indicated that she was interested in [facility] and knew the SW [place] at facility. CSW sent referral thru hub.  CSW then left message on voicemail of SW Katie at [facility]. CSW will f/u with SNF for placement as well as family now interested in that facility as well.  CSW will continue to follow for disposition.  Elissa Hefty, LCSW Clinical Social Worker-Weekend [place] [phone]

## 2018-01-12 ENCOUNTER — Other Ambulatory Visit: Payer: Medicare Other

## 2018-01-12 ENCOUNTER — Ambulatory Visit: Payer: Medicare Other | Admitting: Hematology

## 2018-01-12 LAB — BASIC METABOLIC PANEL
Anion gap: 7 (ref 5–15)
BUN: 38 mg/dL — AB (ref 6–20)
CALCIUM: 8.5 mg/dL — AB (ref 8.9–10.3)
CHLORIDE: 113 mmol/L — AB (ref 101–111)
CO2: 24 mmol/L (ref 22–32)
CREATININE: 1.05 mg/dL (ref 0.61–1.24)
GFR calc Af Amer: >60 mL/min (ref >60)
GFR calc non Af Amer: >60 mL/min (ref >60)
GLUCOSE: 107 mg/dL — AB (ref 65–99)
Potassium: 4.4 mmol/L (ref 3.5–5.1)
Sodium: 144 mmol/L (ref 135–145)

## 2018-01-12 LAB — GLUCOSE, CAPILLARY
GLUCOSE-CAPILLARY: 101 mg/dL — AB (ref 65–99)
GLUCOSE-CAPILLARY: 108 mg/dL — AB (ref 65–99)
GLUCOSE-CAPILLARY: 119 mg/dL — AB (ref 65–99)
GLUCOSE-CAPILLARY: 95 mg/dL (ref 65–99)
Glucose-Capillary: 101 mg/dL — ABNORMAL HIGH (ref 65–99)
Glucose-Capillary: 111 mg/dL — ABNORMAL HIGH (ref 65–99)
Glucose-Capillary: 91 mg/dL (ref 65–99)
Glucose-Capillary: 98 mg/dL (ref 65–99)

## 2018-01-12 LAB — MAGNESIUM: MAGNESIUM: 2.3 mg/dL (ref 1.7–2.4)

## 2018-01-12 NOTE — Progress Notes (Signed)
Hospitalist consult Progress Note        PATIENT DETAILS Name: [name]. Age: 84 y.o. Sex: male Date of Birth: [birth year] Admit Date: 12/30/2017 Admitting Physician Stark Klein, MD WVP:[id number], Alinda Sierras, MD  Brief Narrative: Patient is a 84 y.o. male who underwent diagnostic laparoscopy, open distal gastrectomy with Billroth II anastomosis and placement of a jejunostomy tube on 3/5 due to adenocarcinoma of the stomach-subsequent hospital course complicated by confusion, and subsequently developed hypotension and respiratory failure due to right lower lobe infiltrate/collapse due to mucous plugging requiring intubation.  Patient was provided supportive care, and subsequently improved and transferred back to a medical floor.  Hospitalist service was consulted on 3/14 for assistant and ongoing intermittent confusion.  Subjective: Little more tired today, denies any headache chest pain, no nausea or abdominal pain.   Assessment/Plan:  Acute metabolic encephalopathy/delirium: Combination of hyponatremia, dehydration along with required daily in the setting of acute illness.  CT head unremarkable, he has no headache or focal deficits, now appears to be close to his baseline, continue supportive care, minimize narcotics and benzodiazepines, expose to sunlight, encourage sitting up in chair and monitor.  Hypernatremia: Due to dehydration, Lasix on hold resolved after D5W, stop further fluids.  Monitor fluid status closely.  Combined chronic systolic/diastolic heart failure: Currently dehydrated diuretics on hold monitor closely.  Aspiration pneumonia: Remains on Zosyn-which apparently was started on 3/9- stopped 01/11/2018 and monitor  Gastric cancer status post diagnostic laparoscopy with open distal gastrectomy with Billroth II-placement of a jejunostomy tube: Postop issues-deferred to the primary service.    DVT Prophylaxis: Prophylactic Lovenox    Code Status: Full code   Family Communication: None at bedside  Disposition Plan: Per primary service  Antimicrobial agents: Anti-infectives (From admission, onward)   Start     Dose/Rate Route Frequency Ordered Stop   01/05/18 1000  vancomycin (VANCOCIN) IVPB 1000 mg/200 mL premix  Status:  Discontinued     1,000 mg 200 mL/hr over 60 Minutes Intravenous Every 24 hours 01/04/18 0956 01/05/18 1319   01/04/18 1030  vancomycin (VANCOCIN) 1,500 mg in sodium chloride 0.9 % 500 mL IVPB     1,500 mg 250 mL/hr over 120 Minutes Intravenous  Once 01/04/18 0956 01/04/18 1314   01/03/18 0200  piperacillin-tazobactam (ZOSYN) IVPB 3.375 g  Status:  Discontinued     3.375 g 12.5 mL/hr over 240 Minutes Intravenous Every 8 hours 01/03/18 0112 01/11/18 1002   12/30/17 1714  ceFAZolin (ANCEF) 2-4 GM/100ML-% IVPB    Comments:  Otelia Sergeant   : cabinet override      12/30/17 1714 12/31/17 0529   12/30/17 1600  ceFAZolin (ANCEF) IVPB 2g/100 mL premix     2 g 200 mL/hr over 30 Minutes Intravenous Every 8 hours 12/30/17 1356 12/30/17 1747   12/30/17 0545  ceFAZolin (ANCEF) 2-4 GM/100ML-% IVPB    Comments:  Waldron Session   : cabinet override      12/30/17 0545 12/30/17 0756   12/30/17 0542  ceFAZolin (ANCEF) IVPB 2g/100 mL premix     2 g 200 mL/hr over 30 Minutes Intravenous On call to O.R. 12/30/17 0542 12/30/17 0826     Time spent: 25  minutes-Greater than 50% of this time was spent in counseling, explanation of diagnosis, planning of further management, and coordination of care.  MEDICATIONS: Scheduled Meds: . aspirin  EC  81 mg Oral Daily  . carvedilol  6.25 mg Per Tube BID WC  . Chlorhexidine Gluconate Cloth  6 each Topical Daily  . enoxaparin (LOVENOX) injection  40 mg Subcutaneous Q24H  . feeding supplement  1 Container Oral BID BM  . free water  250 mL Per Tube Q8H  . pantoprazole  40 mg Oral Q1200  . potassium chloride  40 mEq Per Tube Daily   Continuous Infusions: . feeding  supplement (OSMOLITE 1.2 CAL) 1,000 mL (01/12/18 0415)   PRN Meds:.acetaminophen (TYLENOL) oral liquid 160 mg/5 mL, diphenhydrAMINE, haloperidol lactate, hydrALAZINE, iopamidol, nitroGLYCERIN, ondansetron **OR** ondansetron (ZOFRAN) IV, prochlorperazine   PHYSICAL EXAM: Vital signs: Vitals:   01/11/18 2035 01/12/18 0418 01/12/18 0554 01/12/18 0943  BP: 130/61  113/65 130/65  Pulse: 96  (!) 105 (!) 108  Resp: _0 Temp: 99 F (37.2 C)  97.9 F (36.6 C) 98.7 F (37.1 C)  TempSrc: Oral  Oral Oral  SpO2: 96%  99% 93%  Weight:  80.1 kg (176 lb 9.4 oz)    Height:       Filed Weights   01/10/18 0500 01/11/18 0500 01/12/18 0418  Weight: 81.2 kg (179 lb 0.2 oz) 81 kg (178 lb 9.2 oz) 80.1 kg (176 lb 9.4 oz)   Body mass index is 23.95 kg/m.   Exam  Awake but he appears somewhat tired, no new F.N deficits, Normal affect [place].AT,PERRAL Supple Neck,No JVD, No cervical lymphadenopathy appriciated.  Symmetrical Chest wall movement, Good air movement bilaterally, CTAB RRR,No Gallops, Rubs or new Murmurs, No Parasternal Heave +ve B.Sounds, Abd Soft, No tenderness, No organomegaly appriciated, No rebound - guarding or rigidity. PEG tube is in place, midline abdominal incision site stable  No Cyanosis, Clubbing or edema, No new Rash or bruise    I have personally reviewed following labs and imaging studies  LABORATORY DATA: CBC: Recent Labs  Lab 01/07/18 0545 01/08/18 0502 01/09/18 0606 01/10/18 0508 01/11/18 0428  WBC 5.5 4.8 4.1 6.6 5.8  HGB 9.6* 9.1* 8.8* 8.6* 8.2*  HCT 31.2* 28.9* 28.6* 28.7* 27.6*  MCV 95.4 95.7 96.6 96.6 98.9  PLT 158 170 181 211 929    Basic Metabolic Panel: Recent Labs  Lab 01/06/18 0929  01/08/18 0502 01/09/18 0606 01/10/18 0508 01/11/18 0428 01/12/18 0445  NA 150*   < > 150* 154* 152* 148* 144  K 3.3*   < > 3.2* 3.4* 3.6 3.9 4.4  CL 115*   < > 114* 114* 114* 114* 113*  CO2 26   < > _1 GLUCOSE 99   < > 119* 113* 123* 116*  107*  BUN 22*   < > 18 25* 43* 37* 38*  CREATININE 1.33*   < > 1.13 1.21 1.16 1.12 1.05  CALCIUM 8.5*   < > 8.4* 9.2 9.1 8.6* 8.5*  MG 2.0  --   --   --   --   --  2.3  PHOS 3.7  --   --   --   --   --   --    < > = values in this interval not displayed.    GFR: Estimated Creatinine Clearance: 59.5 mL/min (by C-G formula based on SCr of 1.05 mg/dL).  Liver Function Tests: Recent Labs  Lab 01/06/18 0929  AST 43*  ALT 34  ALKPHOS 72  BILITOT 1.0  PROT 5.2*  ALBUMIN 2.5*   No results for input(s): LIPASE,  AMYLASE in the last 168 hours. No results for input(s): AMMONIA in the last 168 hours.  Coagulation Profile: No results for input(s): INR, PROTIME in the last 168 hours.  Cardiac Enzymes: No results for input(s): CKTOTAL, CKMB, CKMBINDEX, TROPONINI in the last 168 hours.  BNP (last 3 results) No results for input(s): PROBNP in the last 8760 hours.  HbA1C: No results for input(s): HGBA1C in the last 72 hours.  CBG: Recent Labs  Lab 01/11/18 1624 01/11/18 2030 01/12/18 0000 01/12/18 0400 01/12/18 0744  GLUCAP 122* 105* 101* 119* 108*    Lipid Profile: No results for input(s): CHOL, HDL, LDLCALC, TRIG, CHOLHDL, LDLDIRECT in the last 72 hours.  Thyroid Function Tests: No results for input(s): TSH, T4TOTAL, FREET4, T3FREE, THYROIDAB in the last 72 hours.  Anemia Panel: No results for input(s): VITAMINB12, FOLATE, FERRITIN, TIBC, IRON, RETICCTPCT in the last 72 hours.  Urine analysis:    Component Value Date/Time   COLORURINE YELLOW 01/08/2018 1622   APPEARANCEUR CLEAR 01/08/2018 1622   LABSPEC 1.009 01/08/2018 1622   PHURINE 7.0 01/08/2018 1622   GLUCOSEU NEGATIVE 01/08/2018 1622   HGBUR NEGATIVE 01/08/2018 1622   BILIRUBINUR NEGATIVE 01/08/2018 1622   KETONESUR NEGATIVE 01/08/2018 1622   PROTEINUR NEGATIVE 01/08/2018 1622   UROBILINOGEN 1.0 08/23/2015 1302   NITRITE NEGATIVE 01/08/2018 1622   LEUKOCYTESUR NEGATIVE 01/08/2018 1622    Sepsis  Labs: Lactic Acid, Venous    Component Value Date/Time   LATICACIDVEN 1.6 01/03/2018 0710    MICROBIOLOGY: Recent Results (from the past 240 hour(s))  Culture, respiratory (NON-Expectorated)     Status: None   Collection Time: 01/03/18 12:29 AM  Result Value Ref Range Status   Specimen Description   Final    TRACHEAL ASPIRATE Performed at [facility], [place] [address], [place], [place] [address]    Special Requests   Final    NONE Performed at [facility], [place] [address], [place], [place] [address]    Gram Stain   Final    FEW WBC PRESENT, PREDOMINANTLY PMN RARE SQUAMOUS EPITHELIAL CELLS PRESENT ABUNDANT GRAM POSITIVE COCCI ABUNDANT GRAM NEGATIVE RODS RARE GRAM POSITIVE RODS    Culture   Final    Consistent with normal respiratory flora. Performed at [facility], [place] [address], [place], [place] [address]    Report Status 01/05/2018 FINAL  Final  Culture, blood (routine x 2)     Status: None   Collection Time: 01/03/18  3:50 AM  Result Value Ref Range Status   Specimen Description   Final    BLOOD RIGHT FOREARM Performed at [name] [address], [place], [place] [address]    Special Requests   Final    IN PEDIATRIC BOTTLE Blood Culture adequate volume Performed at [place] [address], [place], [place] [address]    Culture   Final    NO GROWTH 5 DAYS Performed at [facility], [place] [address], [place], [place] [address]    Report Status 01/08/2018 FINAL  Final  Culture, blood (routine x 2)     Status: None   Collection Time: 01/03/18  3:50 AM  Result Value Ref Range Status   Specimen Description   Final    BLOOD RIGHT HAND Performed at [place] [address], [place], [place] [address]    Special Requests   Final    IN PEDIATRIC BOTTLE Blood Culture adequate volume Performed at [facility]  [place] [address],  [place], [place] [address]    Culture   Final    NO GROWTH 5 DAYS Performed at [facility], [place] [address] [place], [place] [address]    Report Status 01/08/2018 FINAL  Final  Culture, bal-quantitative     Status: Abnormal   Collection Time: 01/03/18 12:02 PM  Result Value Ref Range Status   Specimen Description   Final    BRONCHIAL ALVEOLAR LAVAGE Performed at [place] [address], [place], [place] [address]    Special Requests   Final    Normal Performed at [facility], [place] [address], [place], [place] [address]    Gram Stain   Final    FEW WBC PRESENT,BOTH PMN AND MONONUCLEAR FEW SQUAMOUS EPITHELIAL CELLS PRESENT FEW GRAM POSITIVE COCCI IN CHAINS    Culture (A)  Final    >=100,000 COLONIES/mL Consistent with normal respiratory flora. Performed at [facility], [place] [address] [place], [place] [address]    Report Status 01/06/2018 FINAL  Final  Pneumocystis smear by DFA     Status: None   Collection Time: 01/03/18 12:02 PM  Result Value Ref Range Status   Specimen Source-PJSRC BRONCHIAL ALVEOLAR LAVAGE  Final   Pneumocystis jiroveci Ag NEGATIVE  Final    Comment: CALLED TO H.FRAIZER,RN 01/04/18 _0  BY V.WILKINS Performed at [facility], [place] [address] [place], [place] [address]   Acid Fast Smear (AFB)     Status: None   Collection Time: 01/03/18 12:03 PM  Result Value Ref Range Status   AFB Specimen Processing Concentration  Final   Acid Fast Smear Negative  Final    Comment: (NOTE) Performed At: [facility] [place], [place] [id number] Rush Farmer MD [id number]    Source (AFB) BRONCHIAL ALVEOLAR LAVAGE  Final    Comment: Performed at [place] [address], [place], [place] [address]  Fungus Culture With Stain     Status: None   Collection Time: 01/03/18 12:03 PM  Result Value Ref Range Status   Fungus Stain Final report  Final    Fungus (Mycology) Culture Preliminary report  Final    Comment: (NOTE) Performed At: [facility] [place], [place] [id number] Rush Farmer MD AQ:[id number]    Fungal Source BRONCHIAL ALVEOLAR LAVAGE  Final    Comment: Performed at [facility], [place] [address], [place], [place] [address]  Fungus Culture Result     Status: None   Collection Time: 01/03/18 12:03 PM  Result Value Ref Range Status   Result 1 Comment  Final    Comment: (NOTE) Fungal elements, such as arthroconidia, hyphal fragments, chlamydoconidia, observed. Performed At: [facility] [place], [place] [id number] Rush Farmer MD JG:[id number] Performed at [facility], [place] [address], [place], [place] [address]   Fungal organism reflex     Status: None   Collection Time: 01/03/18 12:03 PM  Result Value Ref Range Status   Fungal result 1 Candida albicans  Final    Comment: (NOTE) Performed At: [facility] [place], [place] [id number] Rush Farmer MD [id number] Performed at [facility], [place] [address] [place],  [address]     RADIOLOGY STUDIES/RESULTS: Ct Head Wo Contrast  Result Date: 01/02/2018 CLINICAL DATA:  Altered level of consciousness and confusion. EXAM: CT HEAD WITHOUT CONTRAST TECHNIQUE: Contiguous axial images were  obtained from the base of the skull through the vertex without intravenous contrast. COMPARISON:  09/10/2017 FINDINGS: Brain: Generalized atrophy. Chronic small-vessel ischemic changes throughout the white matter. No sign of acute infarction, mass lesion, hemorrhage, hydrocephalus or extra-axial collection. Vascular: No abnormal vascular finding. Skull: Normal Sinuses/Orbits: Clear/normal Other: None IMPRESSION: No acute finding. Chronic atrophy and small-vessel ischemic changes of the brain. Electronically Signed   By: Nelson Chimes M.D.   On: 01/02/2018  10:59   Ct Abdomen Pelvis W Contrast  Result Date: 12/16/2017 CLINICAL DATA:  Abdominal pain, nausea and vomiting, and abdominal distention. Newly diagnosed gastric carcinoma. Mantle cell lymphoma. EXAM: CT ABDOMEN AND PELVIS WITH CONTRAST TECHNIQUE: Multidetector CT imaging of the abdomen and pelvis was performed using the standard protocol following bolus administration of intravenous contrast. CONTRAST:  166m ISOVUE-300 IOPAMIDOL (ISOVUE-300) INJECTION 61% COMPARISON:  PET-CT on 09/08/2017 FINDINGS: Lower Chest: Small bilateral pleural effusions. Hepatobiliary: No hepatic masses identified. Gallbladder is unremarkable. Pancreas:  No mass or inflammatory changes. Spleen: Within normal limits in size and appearance. Adrenals/Urinary Tract: No masses identified. No evidence of hydronephrosis. Stomach/Bowel: Gastric soft tissue mass involving the distal body and antrum measures 5.7 x 3.5 cm on image 18/2, and appears increased in size since previous study. No evidence of gastric outlet or bowel obstruction, inflammatory process, or abnormal fluid collections. Vascular/Lymphatic: No pathologically enlarged lymph nodes. No abdominal aortic aneurysm. Aortic atherosclerosis. Reproductive:  No mass or other significant abnormality. Other:  None. Musculoskeletal:  No suspicious bone lesions identified. IMPRESSION: Increased size of 5.7 cm gastric mass, consistent with recently diagnosed gastric carcinoma. No evidence of metastatic disease. No evidence of gastric outlet obstruction. New small bilateral pleural effusions incidentally noted. Electronically Signed   By: JEarle GellM.D.   On: 12/16/2017 09:27   Dg Chest Port 1 View  Result Date: 01/08/2018 CLINICAL DATA:  Status post bronchoscopy EXAM: PORTABLE CHEST 1 VIEW COMPARISON:  Three days ago FINDINGS: Improved aeration in the lower lungs with better diaphragm visualization. There is no edema, consolidation, effusion, or pneumothorax. Chronic cardiomegaly and  aortic tortuosity. Left IJ catheter has been removed. IMPRESSION: Continued improvement in lower lung aeration. Electronically Signed   By: JMonte FantasiaM.D.   On: 01/08/2018 09:46   Dg Chest Port 1 View  Result Date: 01/05/2018 CLINICAL DATA:  Pneumonia. EXAM: PORTABLE CHEST 1 VIEW COMPARISON:  Chest x-ray dated 01/03/2018. FINDINGS: Continued improvement in aeration at the right lung base, perhaps mild residual atelectasis. Probable additional mild atelectasis at the left lung base. Heart size and mediastinal contours are stable. Endotracheal tube is been removed. Enteric tube has been removed. Left IJ central line is stable in position with tip at the level of the upper SVC. No pneumothorax seen. IMPRESSION: 1. Continued improvement in aeration at the right lung base. Probable mild residual atelectasis. Suspect additional mild atelectasis at the left lung base. 2. No new or developing opacity to suggest pneumonia. Electronically Signed   By: SFranki CabotM.D.   On: 01/05/2018 14:12   Dg Chest Port 1 View  Result Date: 01/03/2018 CLINICAL DATA:  S/p right lobe bronchoscopy EXAM: PORTABLE CHEST 1 VIEW COMPARISON:  Chest x-ray from earlier same day. Chest x-ray dated 01/02/2018. FINDINGS: Improved aeration at the right lung base, presumably resolving airspace collapse. Commensurate reduction of the rightward shift of the mediastinal structures. Endotracheal tube remains well positioned with tip approximately 2.5 cm above the carina. Enteric tube passes below the diaphragm. Left IJ central line is stable in  position with tip at the level of the upper SVC. Heart size is stable.  No pneumothorax seen. IMPRESSION: 1. Improved aeration at the right lung base compared to the chest x-ray from earlier today, presumably resolving airspace collapse status post bronchoscopy. Commensurate reduction of the rightward shift of the mediastinal structures. 2. Support apparatus appears stable in position. Electronically  Signed   By: Franki Cabot M.D.   On: 01/03/2018 12:32   Dg Chest Port 1 View  Result Date: 01/03/2018 CLINICAL DATA:  Central line placement. EXAM: PORTABLE CHEST 1 VIEW COMPARISON:  Chest radiograph performed 01/02/2018 FINDINGS: The patient's endotracheal tube is seen ending 4-5 cm above the carina. The balloon of the endotracheal tube may be slightly overinflated. The left IJ line is noted ending about the proximal SVC. An enteric tube is noted extending below the diaphragm. There is right-sided volume loss, with apparent rightward shift of the mediastinum. A small right pleural effusion is suspected. The left lung appears clear. No pneumothorax is seen. The cardiomediastinal silhouette is borderline normal in size. External pacing pads are seen. No acute osseous abnormalities are identified. IMPRESSION: 1. Left IJ line noted ending about the proximal SVC. 2. Endotracheal tube seen ending 4-5 cm above the carina. The balloon of the endotracheal tube may be slightly overinflated. 3. Right-sided volume loss, with apparent rightward shift of the mediastinum. Small right pleural effusion noted. Electronically Signed   By: Garald Balding M.D.   On: 01/03/2018 03:20   Portable Chest X-ray  Result Date: 01/02/2018 CLINICAL DATA:  84 y/o  M; intubation. EXAM: PORTABLE CHEST 1 VIEW COMPARISON:  01/02/2018 chest radiograph. FINDINGS: Patient is rotated. Stable cardiac silhouette given projection and technique. Transcutaneous pacing pads. Stable reticular opacities. Right lung base linear opacity, likely atelectasis. Improved aeration of left lung base. Bones are unremarkable. Endotracheal tube tip projects 5.9 cm above carina. Enteric tube tip is in the proximal stomach. IMPRESSION: Endotracheal tube tip projects 5.9 cm above carina. Enteric tube tip is in proximal stomach. Persistent reticular opacities, probably edema. Improved aeration of left lung base and new platelike atelectasis at right lung base.  Electronically Signed   By: Kristine Garbe M.D.   On: 01/02/2018 21:14   Dg Chest Port 1 View  Result Date: 01/02/2018 CLINICAL DATA:  84 y/o  M; respiratory distress. EXAM: PORTABLE CHEST 1 VIEW COMPARISON:  01/02/2018 chest radiograph. FINDINGS: Stable cardiomegaly given projection and technique. Diffuse reticular opacities of the lungs. Persistent left lower lobe opacity. No pneumothorax. Bones are unremarkable. IMPRESSION: Increased reticular opacities of the lungs probably representing interstitial pulmonary edema. Persistent left basilar opacity which may represent effusion and atelectasis, less likely pneumonia. Electronically Signed   By: Kristine Garbe M.D.   On: 01/02/2018 20:26   Dg Chest Port 1 View  Result Date: 01/02/2018 CLINICAL DATA:  Confusion altered mental status EXAM: PORTABLE CHEST 1 VIEW COMPARISON:  09/10/2017 FINDINGS: Heart size mildly enlarged. Negative for heart failure. Mild left lower lobe atelectasis/infiltrate. No effusion. Epidural anesthetic catheter overlies the right chest. IMPRESSION: Mild left lower lobe atelectasis/infiltrate. Cardiac enlargement without heart failure. Electronically Signed   By: Franchot Gallo M.D.   On: 01/02/2018 11:33   Dg Abd Portable 1v  Result Date: 12/31/2017 CLINICAL DATA:  NGT placement EXAM: PORTABLE ABDOMEN - 1 VIEW COMPARISON:  12/16/2017 CT FINDINGS: Tip and side port of a gastric tube are noted in the left upper quadrant of the abdomen presumably in the expected location of the stomach. Left-sided approach catheter  is also noted with tip projecting over the lower lumbar spine at the level of L5. Midline skin staples are present as well as chain sutures in the left upper quadrant. Stool is noted within large bowel without bowel obstruction. No apparent free air. IMPRESSION: Gastric tube is seen in the left upper quadrant in the expected location of the stomach. Electronically Signed   By: Ashley Royalty M.D.   On:  12/31/2017 00:05   Dg Ugi W/high Density W/kub  Result Date: 01/08/2018 CLINICAL DATA:  Postop day 9 partial gastrectomy for cancer. Nausea. Vomiting. EXAM: WATER SOLUBLE UPPER GI SERIES TECHNIQUE: Single-column upper GI series was performed using water soluble contrast. CONTRAST:  100 mL Isovue-300 orally COMPARISON:  12/30/2017, CT 12/16/2017 FLUOROSCOPY TIME:  Fluoroscopy Time:  3 minutes 0 seconds Radiation Exposure Index (if provided by the fluoroscopic device): Number of Acquired Spot Images: 0 FINDINGS: Preliminary KUB demonstrates normal bowel gas pattern. Jejunostomy feeding tube is present in the left abdomen. Decreased esophageal motility. Mild esophageal dilatation. Contrast passed into the stomach which has been partially resected. Gastric jejunostomy is patent. No leak. No obstruction of the stomach. Jejunum is nondilated. Mild gastroesophageal reflux IMPRESSION: Postop partial gastrectomy with gastrojejunostomy. No leak or obstruction identified Decreased esophageal peristalsis.  Mild gastroesophageal reflux. Electronically Signed   By: Franchot Gallo M.D.   On: 01/08/2018 13:12     LOS: 13 days   Signature  Lala Lund M.D on 01/12/2018 at 10:15 AM  Between 7am to 7pm - Pager - [phone] ( page via [email or web address], text pages only, please mention full 10 digit call back number).  After 7pm go to [email or web address] - password [facility]

## 2018-01-12 NOTE — Progress Notes (Signed)
CSW followed up with Joellen Jersey at [facility]. She is reviewing patient clinical information to determine if they are able to offer a bed.   CSW will discuss with patient spouse.  Kathrin Greathouse, Latanya Presser, MSW Clinical Social Worker  [phone] 01/12/2018  12:28 PM

## 2018-01-12 NOTE — Progress Notes (Signed)
PT Cancellation Note  Patient Details Name: [name]. MRN: [id number] DOB: 1933-01-03   Cancelled Treatment:     pt was OOB early and requested to rest.  Will try later as schedule permits.     Rica Koyanagi  PTA [facility]  Acute  Rehab Pager      [phone]

## 2018-01-12 NOTE — Progress Notes (Signed)
13 Days Post-Op   Subjective: Patient seen with wife present.  No major complaints today.  Still feels somewhat weak.  Is drinking small amounts of clear liquids without nausea or vomiting which is an improvement.  Some diarrhea with tube feeding.  Objective: Vital signs in last 24 hours: Temp:  [97.9 F (36.6 C)-99 F (37.2 C)] 98.7 F (37.1 C) (03/18 0943) Pulse Rate:  [81-108] 108 (03/18 0943) Resp:  [16-18] 18 (03/18 0943) BP: (113-130)/(54-65) 130/65 (03/18 0943) SpO2:  [93 %-99 %] 93 % (03/18 0943) Weight:  [80.1 kg (176 lb 9.4 oz)] 80.1 kg (176 lb 9.4 oz) (03/18 0418) Last BM Date: 01/12/18  Intake/Output from previous day: 03/17 0701 - 03/18 0700 In: 3313 [P.O.:360; I.V.:1535; NG/GT:1418] Out: 1100 [Urine:1100] Intake/Output this shift: Total I/O In: -  Out: 350 [Urine:350]  General appearance: alert, cooperative, no distress and slowed mentation GI: Nondistended.  Soft and nontender. Incision/Wound: Staples intact.  No erythema or drainage.  Lab Results:  Recent Labs    01/10/18 0508 01/11/18 0428  WBC 6.6 5.8  HGB 8.6* 8.2*  HCT 28.7* 27.6*  PLT 211 210   BMET Recent Labs    01/11/18 0428 01/12/18 0445  NA 148* 144  K 3.9 4.4  CL 114* 113*  CO2 24 24  GLUCOSE 116* 107*  BUN 37* 38*  CREATININE 1.12 1.05  CALCIUM 8.6* 8.5*     Studies/Results: No results found.  Anti-infectives: Anti-infectives (From admission, onward)   Start     Dose/Rate Route Frequency Ordered Stop   01/05/18 1000  vancomycin (VANCOCIN) IVPB 1000 mg/200 mL premix  Status:  Discontinued     1,000 mg 200 mL/hr over 60 Minutes Intravenous Every 24 hours 01/04/18 0956 01/05/18 1319   01/04/18 1030  vancomycin (VANCOCIN) 1,500 mg in sodium chloride 0.9 % 500 mL IVPB     1,500 mg 250 mL/hr over 120 Minutes Intravenous  Once 01/04/18 0956 01/04/18 1314   01/03/18 0200  piperacillin-tazobactam (ZOSYN) IVPB 3.375 g  Status:  Discontinued     3.375 g 12.5 mL/hr over 240  Minutes Intravenous Every 8 hours 01/03/18 0112 01/11/18 1002   12/30/17 1714  ceFAZolin (ANCEF) 2-4 GM/100ML-% IVPB    Comments:  Otelia Sergeant   : cabinet override      12/30/17 1714 12/31/17 0529   12/30/17 1600  ceFAZolin (ANCEF) IVPB 2g/100 mL premix     2 g 200 mL/hr over 30 Minutes Intravenous Every 8 hours 12/30/17 1356 12/30/17 1747   12/30/17 0545  ceFAZolin (ANCEF) 2-4 GM/100ML-% IVPB    Comments:  Waldron Session   : cabinet override      12/30/17 0545 12/30/17 0756   12/30/17 0542  ceFAZolin (ANCEF) IVPB 2g/100 mL premix     2 g 200 mL/hr over 30 Minutes Intravenous On call to O.R. 12/30/17 0542 12/30/17 0826      Assessment/Plan: s/p Procedure(s): DIAGNOSTIC LAPAROSCOPY WITH OPEN DISTAL GASTRECTOMY AND PLACEMENT JEJUNOSTOMY FEEDING TUBE Postoperative confusion and pneumonia.  Now off antibiotics.  Medicine following. Postoperative nausea and vomiting.  This seems much improved the last day or 2.  Drinking some clear liquids.  On full tube feeding support.  Options for placement being explored.   LOS: 13 days    Edward Jolly 3/18/2019Patient ID: [name]., male   DOB: Jun 10, 1933, 84 y.o.   MRN: [id number]

## 2018-01-13 LAB — PREPARE RBC (CROSSMATCH)

## 2018-01-13 LAB — COMPREHENSIVE METABOLIC PANEL
ALT: 20 U/L (ref 17–63)
ANION GAP: 9 (ref 5–15)
AST: 22 U/L (ref 15–41)
Albumin: 3.2 g/dL — ABNORMAL LOW (ref 3.5–5.0)
Alkaline Phosphatase: 46 U/L (ref 38–126)
BUN: 51 mg/dL — ABNORMAL HIGH (ref 6–20)
CALCIUM: 8.4 mg/dL — AB (ref 8.9–10.3)
CO2: 24 mmol/L (ref 22–32)
Chloride: 113 mmol/L — ABNORMAL HIGH (ref 101–111)
Creatinine, Ser: 1.07 mg/dL (ref 0.61–1.24)
Glucose, Bld: 116 mg/dL — ABNORMAL HIGH (ref 65–99)
Potassium: 4.6 mmol/L (ref 3.5–5.1)
Sodium: 146 mmol/L — ABNORMAL HIGH (ref 135–145)
TOTAL PROTEIN: 5.1 g/dL — AB (ref 6.5–8.1)
Total Bilirubin: 0.4 mg/dL (ref 0.3–1.2)

## 2018-01-13 LAB — GLUCOSE, CAPILLARY
GLUCOSE-CAPILLARY: 102 mg/dL — AB (ref 65–99)
GLUCOSE-CAPILLARY: 115 mg/dL — AB (ref 65–99)
GLUCOSE-CAPILLARY: 97 mg/dL (ref 65–99)
Glucose-Capillary: 94 mg/dL (ref 65–99)

## 2018-01-13 LAB — CBC
HCT: 18.8 % — ABNORMAL LOW (ref 39.0–52.0)
HEMOGLOBIN: 5.6 g/dL — AB (ref 13.0–17.0)
MCH: 29.9 pg (ref 26.0–34.0)
MCHC: 29.8 g/dL — AB (ref 30.0–36.0)
MCV: 100.5 fL — AB (ref 78.0–100.0)
Platelets: 234 10*3/uL (ref 150–400)
RBC: 1.87 MIL/uL — ABNORMAL LOW (ref 4.22–5.81)
RDW: 19.5 % — ABNORMAL HIGH (ref 11.5–15.5)
WBC: 6.2 10*3/uL (ref 4.0–10.5)

## 2018-01-13 LAB — HEMOGLOBIN AND HEMATOCRIT, BLOOD
HCT: 20.1 % — ABNORMAL LOW (ref 39.0–52.0)
HCT: 31 % — ABNORMAL LOW (ref 39.0–52.0)
HEMOGLOBIN: 6 g/dL — AB (ref 13.0–17.0)
HEMOGLOBIN: 9.5 g/dL — AB (ref 13.0–17.0)

## 2018-01-13 MED ORDER — ORAL CARE MOUTH RINSE
15.0000 mL | Freq: Two times a day (BID) | OROMUCOSAL | Status: DC
  Administered 2018-01-13 – 2018-01-22 (×12): 15 mL via OROMUCOSAL

## 2018-01-13 MED ORDER — ENSURE ENLIVE PO LIQD
237.0000 mL | Freq: Two times a day (BID) | ORAL | Status: DC
  Administered 2018-01-13 – 2018-01-15 (×4): 237 mL via ORAL

## 2018-01-13 MED ORDER — SODIUM CHLORIDE 0.9 % IV SOLN: Freq: Once | INTRAVENOUS | Status: DC

## 2018-01-13 MED ORDER — PANTOPRAZOLE SODIUM 40 MG PO TBEC
40.0000 mg | DELAYED_RELEASE_TABLET | Freq: Two times a day (BID) | ORAL | Status: DC
Start: 2018-01-13 — End: 2018-01-22
  Administered 2018-01-13 – 2018-01-22 (×15): 40 mg via ORAL
  Filled 2018-01-13 (×16): qty 1

## 2018-01-13 MED ORDER — FREE WATER
250.0000 mL | Freq: Four times a day (QID) | Status: DC
  Administered 2018-01-13 – 2018-01-16 (×13): 250 mL

## 2018-01-13 MED ORDER — SODIUM CHLORIDE 0.9 % IV SOLN
Freq: Once | INTRAVENOUS | Status: AC
  Administered 2018-01-13: 12:00:00 via INTRAVENOUS

## 2018-01-13 MED ORDER — CHLORHEXIDINE GLUCONATE 0.12 % MT SOLN
15.0000 mL | Freq: Two times a day (BID) | OROMUCOSAL | Status: DC
  Administered 2018-01-13 – 2018-01-21 (×16): 15 mL via OROMUCOSAL
  Filled 2018-01-13 (×14): qty 15

## 2018-01-13 NOTE — Progress Notes (Signed)
Patient ID: Jacob Bowens., male   DOB: 09-24-1933, 84 y.o.   MRN: [id number] 14 Days Post-Op   Subjective: Patient somewhat depressed and discouraged.  No energy and no appetite.  Some loose bowel movements.  Minimal abdominal pain.  Denies any nausea or vomiting.  Objective: Vital signs in last 24 hours: Temp:  [97.5 F (36.4 C)-98.7 F (37.1 C)] 98.2 F (36.8 C) (03/19 0408) Pulse Rate:  [75-115] 105 (03/19 0408) Resp:  [16-18] 16 (03/19 0408) BP: (98-130)/(52-65) 114/58 (03/19 0408) SpO2:  [93 %-98 %] 98 % (03/19 0408) Weight:  [80.8 kg (178 lb 2.1 oz)] 80.8 kg (178 lb 2.1 oz) (03/19 0408) Last BM Date: 01/12/18  Intake/Output from previous day: 03/18 0701 - 03/19 0700 In: 1613 [NG/GT:1613] Out: 350 [Urine:350] Intake/Output this shift: No intake/output data recorded.  General appearance: alert, cooperative, no distress and Somewhat depressed affect GI: normal findings: soft, non-tender Incision/Wound: Clean and dry. Neuro: Alert and oriented to person and place.  No motor deficits.  Lab Results:  Recent Labs    01/11/18 0428  WBC 5.8  HGB 8.2*  HCT 27.6*  PLT 210   BMET Recent Labs    01/11/18 0428 01/12/18 0445  NA 148* 144  K 3.9 4.4  CL 114* 113*  CO2 24 24  GLUCOSE 116* 107*  BUN 37* 38*  CREATININE 1.12 1.05  CALCIUM 8.6* 8.5*     Studies/Results: No results found.  Anti-infectives: Anti-infectives (From admission, onward)   Start     Dose/Rate Route Frequency Ordered Stop   01/05/18 1000  vancomycin (VANCOCIN) IVPB 1000 mg/200 mL premix  Status:  Discontinued     1,000 mg 200 mL/hr over 60 Minutes Intravenous Every 24 hours 01/04/18 0956 01/05/18 1319   01/04/18 1030  vancomycin (VANCOCIN) 1,500 mg in sodium chloride 0.9 % 500 mL IVPB     1,500 mg 250 mL/hr over 120 Minutes Intravenous  Once 01/04/18 0956 01/04/18 1314   01/03/18 0200  piperacillin-tazobactam (ZOSYN) IVPB 3.375 g  Status:  Discontinued     3.375 g 12.5 mL/hr over  240 Minutes Intravenous Every 8 hours 01/03/18 0112 01/11/18 1002   12/30/17 1714  ceFAZolin (ANCEF) 2-4 GM/100ML-% IVPB    Comments:  Otelia Sergeant   : cabinet override      12/30/17 1714 12/31/17 0529   12/30/17 1600  ceFAZolin (ANCEF) IVPB 2g/100 mL premix     2 g 200 mL/hr over 30 Minutes Intravenous Every 8 hours 12/30/17 1356 12/30/17 1747   12/30/17 0545  ceFAZolin (ANCEF) 2-4 GM/100ML-% IVPB    Comments:  Waldron Session   : cabinet override      12/30/17 0545 12/30/17 0756   12/30/17 0542  ceFAZolin (ANCEF) IVPB 2g/100 mL premix     2 g 200 mL/hr over 30 Minutes Intravenous On call to O.R. 12/30/17 0542 12/30/17 0826      Assessment/Plan: s/p Procedure(s): DIAGNOSTIC LAPAROSCOPY WITH OPEN DISTAL GASTRECTOMY AND PLACEMENT JEJUNOSTOMY FEEDING TUBE Postoperative pneumonia and VDR F, resolved Postoperative nausea and vomiting.  This is improved.  Tolerating tube feedings at 60 cc an hour.  Will leave at this rate and add full liquid diet.  Continue therapies.  Remove staples.   LOS: 14 days    Edward Jolly 01/13/2018

## 2018-01-13 NOTE — Progress Notes (Signed)
Nutrition Follow-up  INTERVENTION:   -Continue Osmolite 1.2 @ 60 ml/hr via J-tube. -Encourage PO intake -Provide Ensure Enlive po BID, each supplement provides 350 kcal and 20 grams of protein -D/c Boost Breeze (pt states too sweet) -Ordered Calorie Count -now that pt is on full liquids  If pt unable to eat PO, goal rate for TF is Osmolite 1.2 @ 80 ml/hr via J-tube. Providing 2304 kcal, 106g protein and 1574 ml H2O.  NUTRITION DIAGNOSIS:   Increased nutrient needs related to catabolic illness, cancer and cancer related treatments, wound healing as evidenced by estimated needs.  Ongoing.  GOAL:   Patient will meet greater than or equal to 90% of their needs  Progressing.  MONITOR:   PO intake, Supplement acceptance, Labs, Weight trends, Skin, I & O's, TF tolerance  REASON FOR ASSESSMENT:   Consult Enteral/tube feeding initiation and management  ASSESSMENT:   84 year old male with gastric cancer who presented with an abnormal PET scan on follow-up for lymphoma (Stage IV, dx 08/2016).  He had a gastric mass seen on the PET scan and upper endoscopy confirmed gastric cancer in this location. The patient denies any issues in appetite.  He has not had any weight loss. His energy level has improved significantly since his treatment for lymphoma and is s/p chemo therapy for lymphoma. He has also seen radiation oncology. His bone marrow bx was negative for marrow involvement with lymphoma.   Pt reports he plans to eat today but has not ordered any full liquids yet today. Diet just advanced per surgery to full liquids and plan is for TF to remain at 60 ml/hr (1728 kcal, 79g protein).  Calorie count re-initiated now that diet is advanced. That note to follow 3/20. RD placed envelope on door.  Pt has not been drinking Boost Breeze given sweetness, will switch to Ensure which can be diluted with milk to help with sweetness.  As of today, weight is -5 lb since 3/5.  Medications: Free  water 250 ml every 6 hours (1000 ml total), KCl infusion Labs reviewed: Elevated Na  Diet Order:  Diet full liquid Room service appropriate? Yes; Fluid consistency: Thin  EDUCATION NEEDS:   Not appropriate for education at this time  Skin:  Skin Assessment: Skin Integrity Issues: Skin Integrity Issues:: Incisions Incisions: abdominal (3/5)  Last BM:  3/18  Height:   Ht Readings from Last 1 Encounters:  12/30/17 6' (1.829 m)    Weight:   Wt Readings from Last 1 Encounters:  01/13/18 178 lb 2.1 oz (80.8 kg)    Ideal Body Weight:  80.91 kg  BMI:  Body mass index is 24.16 kg/m.  Estimated Nutritional Needs:   Kcal:  2330-2500 (28-30 kcal/kg)  Protein:  125-140 grams (1.5-1.7 grams/kg)  Fluid:  >/= 2.3 L/day  Clayton Bibles, MS, RD, LDN [facility] Pager: [phone] After Hours Pager: [phone]

## 2018-01-13 NOTE — Progress Notes (Addendum)
Hospitalist consult Progress Note        PATIENT DETAILS Name: [name]. Age: 84 y.o. Sex: male Date of Birth: 02-05-1933 Admit Date: 12/30/2017 Admitting Physician Stark Klein, MD LHT:[id number], Alinda Sierras, MD  Brief Narrative: Patient is a 84 y.o. male who underwent diagnostic laparoscopy, open distal gastrectomy with Billroth II anastomosis and placement of a jejunostomy tube on 3/5 due to adenocarcinoma of the stomach-subsequent hospital course complicated by confusion, and subsequently developed hypotension and respiratory failure due to right lower lobe infiltrate/collapse due to mucous plugging requiring intubation.  Patient was provided supportive care, and subsequently improved and transferred back to a medical floor.  Hospitalist service was consulted on 3/14 for assistant and ongoing intermittent confusion.  Subjective: In bed, denies any headache chest or abdominal pain, but does say that he feels a little tired and mildly short of breath.  Had a bowel movement this morning.   Assessment/Plan:  Acute metabolic encephalopathy/delirium: Combination of hyponatremia, dehydration along with required daily in the setting of acute illness.  CT head unremarkable, he has no headache or focal deficits, now appears to be close to his baseline, continue supportive care, minimize narcotics and benzodiazepines, expose to sunlight, encourage sitting up in chair and monitor.  He certainly remains at risk for delirium due to his age in hospital setting.  Hypernatremia: Purely due to dehydration had resolved with holding Lasix and D5W drip, now again getting hyponatremic, I have adjusted his free water flushes to 250 cc every 6hrs via PEG tube.  Will monitor.  Combined chronic systolic/diastolic heart failure: Currently dehydrated diuretics on hold monitor closely.  Aspiration pneumonia: Remains on Zosyn-which apparently was started on 3/9- stopped 01/11/2018 and  monitor  Gastric cancer status post diagnostic laparoscopy with open distal gastrectomy with Billroth II-placement of a jejunostomy tube: Postop issues-deferred to the primary service.  Perioperative blood loss related anemia.  Acute drop in 2 points within the last few days, BUN is stable hence doubt there is any upper GI source, some element of dilution, will increase PPI to twice daily, transfuse 2 units of packed RBC on 01/13/2018 and monitor.  No signs of ongoing GI bleed.    DVT Prophylaxis: SCDs  Code Status: Full code   Family Communication: None at bedside  Disposition Plan: Per primary service  Antimicrobial agents: Anti-infectives (From admission, onward)   Start     Dose/Rate Route Frequency Ordered Stop   01/05/18 1000  vancomycin (VANCOCIN) IVPB 1000 mg/200 mL premix  Status:  Discontinued     1,000 mg 200 mL/hr over 60 Minutes Intravenous Every 24 hours 01/04/18 0956 01/05/18 1319   01/04/18 1030  vancomycin (VANCOCIN) 1,500 mg in sodium chloride 0.9 % 500 mL IVPB     1,500 mg 250 mL/hr over 120 Minutes Intravenous  Once 01/04/18 0956 01/04/18 1314   01/03/18 0200  piperacillin-tazobactam (ZOSYN) IVPB 3.375 g  Status:  Discontinued     3.375 g 12.5 mL/hr over 240 Minutes Intravenous Every 8 hours 01/03/18 0112 01/11/18 1002   12/30/17 1714  ceFAZolin (ANCEF) 2-4 GM/100ML-% IVPB    Comments:  Otelia Sergeant   : cabinet override      12/30/17 1714 12/31/17 0529   12/30/17 1600  ceFAZolin (ANCEF) IVPB 2g/100 mL premix     2 g 200 mL/hr over 30 Minutes Intravenous Every 8 hours  12/30/17 1356 12/30/17 1747   12/30/17 0545  ceFAZolin (ANCEF) 2-4 GM/100ML-% IVPB    Comments:  Waldron Session   : cabinet override      12/30/17 0545 12/30/17 0756   12/30/17 0542  ceFAZolin (ANCEF) IVPB 2g/100 mL premix     2 g 200 mL/hr over 30 Minutes Intravenous On call to O.R. 12/30/17 0542 12/30/17 0826     Time spent: 25  minutes-Greater than 50% of this time was spent in  counseling, explanation of diagnosis, planning of further management, and coordination of care.  MEDICATIONS: Scheduled Meds: . carvedilol  6.25 mg Per Tube BID WC  . Chlorhexidine Gluconate Cloth  6 each Topical Daily  . feeding supplement  1 Container Oral BID BM  . free water  250 mL Per Tube Q6H  . pantoprazole  40 mg Oral Q1200  . potassium chloride  40 mEq Per Tube Daily   Continuous Infusions: . sodium chloride    . sodium chloride    . feeding supplement (OSMOLITE 1.2 CAL) 1,000 mL (01/12/18 2154)   PRN Meds:.acetaminophen (TYLENOL) oral liquid 160 mg/5 mL, diphenhydrAMINE, haloperidol lactate, hydrALAZINE, iopamidol, nitroGLYCERIN, ondansetron **OR** ondansetron (ZOFRAN) IV, prochlorperazine   PHYSICAL EXAM: Vital signs: Vitals:   01/12/18 2140 01/13/18 0408 01/13/18 1009 01/13/18 1049  BP: 106/65 (!) 114/58 (!) 108/57 (!) 98/52  Pulse: (!) 115 (!) 105 (!) 103 (!) 104  Resp: _0 Temp: 98.3 F (36.8 C) 98.2 F (36.8 C) (!) 97.5 F (36.4 C) 98 F (36.7 C)  TempSrc: Oral Oral Oral Oral  SpO2: 98% 98% 98% 100%  Weight:  80.8 kg (178 lb 2.1 oz)    Height:       Filed Weights   01/11/18 0500 01/12/18 0418 01/13/18 0408  Weight: 81 kg (178 lb 9.2 oz) 80.1 kg (176 lb 9.4 oz) 80.8 kg (178 lb 2.1 oz)   Body mass index is 24.16 kg/m.   Exam  Awake but he appears somewhat tired, no new F.N deficits, Normal affect [place].AT,PERRAL Supple Neck,No JVD, No cervical lymphadenopathy appriciated.  Symmetrical Chest wall movement, Good air movement bilaterally, CTAB RRR,No Gallops, Rubs or new Murmurs, No Parasternal Heave +ve B.Sounds, Abd Soft, No tenderness, No organomegaly appriciated, No rebound - guarding or rigidity. PEG tube is in place, midline abdominal incision site stable  No Cyanosis, Clubbing or edema, No new Rash or bruise    I have personally reviewed following labs and imaging studies  LABORATORY DATA: CBC: Recent Labs  Lab 01/08/18 0502  01/09/18 0606 01/10/18 0508 01/11/18 0428 01/13/18 0829 01/13/18 1037  WBC 4.8 4.1 6.6 5.8 6.2  --   HGB 9.1* 8.8* 8.6* 8.2* 5.6* 6.0*  HCT 28.9* 28.6* 28.7* 27.6* 18.8* 20.1*  MCV 95.7 96.6 96.6 98.9 100.5*  --   PLT 170 181 211 210 234  --     Basic Metabolic Panel: Recent Labs  Lab 01/09/18 0606 01/10/18 0508 01/11/18 0428 01/12/18 0445 01/13/18 0829  NA 154* 152* 148* 144 146*  K 3.4* 3.6 3.9 4.4 4.6  CL 114* 114* 114* 113* 113*  CO2 _1 GLUCOSE 113* 123* 116* 107* 116*  BUN 25* 43* 37* 38* 51*  CREATININE 1.21 1.16 1.12 1.05 1.07  CALCIUM 9.2 9.1 8.6* 8.5* 8.4*  MG  --   --   --  2.3  --     GFR: Estimated Creatinine Clearance: 58.4 mL/min (by C-G formula based on SCr of  1.07 mg/dL).  Liver Function Tests: Recent Labs  Lab 01/13/18 0829  AST 22  ALT 20  ALKPHOS 46  BILITOT 0.4  PROT 5.1*  ALBUMIN 3.2*   No results for input(s): LIPASE, AMYLASE in the last 168 hours. No results for input(s): AMMONIA in the last 168 hours.  Coagulation Profile: No results for input(s): INR, PROTIME in the last 168 hours.  Cardiac Enzymes: No results for input(s): CKTOTAL, CKMB, CKMBINDEX, TROPONINI in the last 168 hours.  BNP (last 3 results) No results for input(s): PROBNP in the last 8760 hours.  HbA1C: No results for input(s): HGBA1C in the last 72 hours.  CBG: Recent Labs  Lab 01/12/18 1724 01/12/18 1942 01/12/18 2343 01/13/18 0341 01/13/18 0731  GLUCAP 98 91 111* 102* 115*    Lipid Profile: No results for input(s): CHOL, HDL, LDLCALC, TRIG, CHOLHDL, LDLDIRECT in the last 72 hours.  Thyroid Function Tests: No results for input(s): TSH, T4TOTAL, FREET4, T3FREE, THYROIDAB in the last 72 hours.  Anemia Panel: No results for input(s): VITAMINB12, FOLATE, FERRITIN, TIBC, IRON, RETICCTPCT in the last 72 hours.  Urine analysis:    Component Value Date/Time   COLORURINE YELLOW 01/08/2018 1622   APPEARANCEUR CLEAR 01/08/2018 1622    LABSPEC 1.009 01/08/2018 1622   PHURINE 7.0 01/08/2018 1622   GLUCOSEU NEGATIVE 01/08/2018 1622   HGBUR NEGATIVE 01/08/2018 1622   BILIRUBINUR NEGATIVE 01/08/2018 1622   KETONESUR NEGATIVE 01/08/2018 1622   PROTEINUR NEGATIVE 01/08/2018 1622   UROBILINOGEN 1.0 08/23/2015 1302   NITRITE NEGATIVE 01/08/2018 1622   LEUKOCYTESUR NEGATIVE 01/08/2018 1622    Sepsis Labs: Lactic Acid, Venous    Component Value Date/Time   LATICACIDVEN 1.6 01/03/2018 0710    MICROBIOLOGY: Recent Results (from the past 240 hour(s))  Culture, bal-quantitative     Status: Abnormal   Collection Time: 01/03/18 12:02 PM  Result Value Ref Range Status   Specimen Description   Final    BRONCHIAL ALVEOLAR LAVAGE Performed at [facility], [place] [address], [place], [place] [address]    Special Requests   Final    Normal Performed at [facility], [place] [address], [place], [place] [address]    Gram Stain   Final    FEW WBC PRESENT,BOTH PMN AND MONONUCLEAR FEW SQUAMOUS EPITHELIAL CELLS PRESENT FEW GRAM POSITIVE COCCI IN CHAINS    Culture (A)  Final    >=100,000 COLONIES/mL Consistent with normal respiratory flora. Performed at [facility], [place] [address], [place], [place] [address]    Report Status 01/06/2018 FINAL  Final  Pneumocystis smear by DFA     Status: None   Collection Time: 01/03/18 12:02 PM  Result Value Ref Range Status   Specimen Source-PJSRC BRONCHIAL ALVEOLAR LAVAGE  Final   Pneumocystis jiroveci Ag NEGATIVE  Final    Comment: CALLED TO H.FRAIZER,RN 01/04/18 _0  BY V.WILKINS Performed at [facility], [place] [address], [place], [place] [address]   Acid Fast Smear (AFB)     Status: None   Collection Time: 01/03/18 12:03 PM  Result Value Ref Range Status   AFB Specimen Processing Concentration  Final   Acid Fast Smear Negative  Final    Comment: (NOTE) Performed At: [facility] [place], [place]  [id number] Rush Farmer MD QQ:[id number]    Source (AFB) BRONCHIAL ALVEOLAR LAVAGE  Final    Comment: Performed at [place] [address], [place], [place] [address]  Fungus Culture With  Stain     Status: None   Collection Time: 01/03/18 12:03 PM  Result Value Ref Range Status   Fungus Stain Final report  Final   Fungus (Mycology) Culture Preliminary report  Final    Comment: (NOTE) Performed At: [facility] [place], [place] [id number] Rush Farmer MD [id number]    Fungal Source BRONCHIAL ALVEOLAR LAVAGE  Final    Comment: Performed at [facility], [place] [address], [place], [place] [address]  Fungus Culture Result     Status: None   Collection Time: 01/03/18 12:03 PM  Result Value Ref Range Status   Result 1 Comment  Final    Comment: (NOTE) Fungal elements, such as arthroconidia, hyphal fragments, chlamydoconidia, observed. Performed At: [facility] [place], [place] [id number] Rush Farmer MD [id number] Performed at [facility], [place] [address], [place], [place] [address]   Fungal organism reflex     Status: None   Collection Time: 01/03/18 12:03 PM  Result Value Ref Range Status   Fungal result 1 Candida albicans  Final    Comment: (NOTE) Performed At: [facility] [place], [place] [id number] Rush Farmer MD [id number] Performed at [facility], [place] [address], [place], [place] [address]     RADIOLOGY STUDIES/RESULTS: Ct Head Wo Contrast  Result Date: 01/02/2018 CLINICAL DATA:  Altered level of consciousness and confusion. EXAM: CT HEAD WITHOUT CONTRAST TECHNIQUE: Contiguous axial images were obtained from the base of the skull through the vertex without intravenous contrast. COMPARISON:  09/10/2017 FINDINGS: Brain: Generalized atrophy. Chronic small-vessel ischemic changes throughout the white matter.  No sign of acute infarction, mass lesion, hemorrhage, hydrocephalus or extra-axial collection. Vascular: No abnormal vascular finding. Skull: Normal Sinuses/Orbits: Clear/normal Other: None IMPRESSION: No acute finding. Chronic atrophy and small-vessel ischemic changes of the brain. Electronically Signed   By: Nelson Chimes M.D.   On: 01/02/2018 10:59   Ct Abdomen Pelvis W Contrast  Result Date: 12/16/2017 CLINICAL DATA:  Abdominal pain, nausea and vomiting, and abdominal distention. Newly diagnosed gastric carcinoma. Mantle cell lymphoma. EXAM: CT ABDOMEN AND PELVIS WITH CONTRAST TECHNIQUE: Multidetector CT imaging of the abdomen and pelvis was performed using the standard protocol following bolus administration of intravenous contrast. CONTRAST:  166m ISOVUE-300 IOPAMIDOL (ISOVUE-300) INJECTION 61% COMPARISON:  PET-CT on 09/08/2017 FINDINGS: Lower Chest: Small bilateral pleural effusions. Hepatobiliary: No hepatic masses identified. Gallbladder is unremarkable. Pancreas:  No mass or inflammatory changes. Spleen: Within normal limits in size and appearance. Adrenals/Urinary Tract: No masses identified. No evidence of hydronephrosis. Stomach/Bowel: Gastric soft tissue mass involving the distal body and antrum measures 5.7 x 3.5 cm on image 18/2, and appears increased in size since previous study. No evidence of gastric outlet or bowel obstruction, inflammatory process, or abnormal fluid collections. Vascular/Lymphatic: No pathologically enlarged lymph nodes. No abdominal aortic aneurysm. Aortic atherosclerosis. Reproductive:  No mass or other significant abnormality. Other:  None. Musculoskeletal:  No suspicious bone lesions identified. IMPRESSION: Increased size of 5.7 cm gastric mass, consistent with recently diagnosed gastric carcinoma. No evidence of metastatic disease. No evidence of gastric outlet obstruction. New small bilateral pleural effusions incidentally noted. Electronically Signed   By: JEarle Gell M.D.   On: 12/16/2017 09:27   Dg Chest Port 1 View  Result Date: 01/08/2018 CLINICAL DATA:  Status post bronchoscopy EXAM: PORTABLE CHEST 1 VIEW COMPARISON:  Three days ago FINDINGS: Improved aeration in the lower lungs with better diaphragm visualization. There  is no edema, consolidation, effusion, or pneumothorax. Chronic cardiomegaly and aortic tortuosity. Left IJ catheter has been removed. IMPRESSION: Continued improvement in lower lung aeration. Electronically Signed   By: Monte Fantasia M.D.   On: 01/08/2018 09:46   Dg Chest Port 1 View  Result Date: 01/05/2018 CLINICAL DATA:  Pneumonia. EXAM: PORTABLE CHEST 1 VIEW COMPARISON:  Chest x-ray dated 01/03/2018. FINDINGS: Continued improvement in aeration at the right lung base, perhaps mild residual atelectasis. Probable additional mild atelectasis at the left lung base. Heart size and mediastinal contours are stable. Endotracheal tube is been removed. Enteric tube has been removed. Left IJ central line is stable in position with tip at the level of the upper SVC. No pneumothorax seen. IMPRESSION: 1. Continued improvement in aeration at the right lung base. Probable mild residual atelectasis. Suspect additional mild atelectasis at the left lung base. 2. No new or developing opacity to suggest pneumonia. Electronically Signed   By: Franki Cabot M.D.   On: 01/05/2018 14:12   Dg Chest Port 1 View  Result Date: 01/03/2018 CLINICAL DATA:  S/p right lobe bronchoscopy EXAM: PORTABLE CHEST 1 VIEW COMPARISON:  Chest x-ray from earlier same day. Chest x-ray dated 01/02/2018. FINDINGS: Improved aeration at the right lung base, presumably resolving airspace collapse. Commensurate reduction of the rightward shift of the mediastinal structures. Endotracheal tube remains well positioned with tip approximately 2.5 cm above the carina. Enteric tube passes below the diaphragm. Left IJ central line is stable in position with tip at the level of the upper SVC. Heart size  is stable.  No pneumothorax seen. IMPRESSION: 1. Improved aeration at the right lung base compared to the chest x-ray from earlier today, presumably resolving airspace collapse status post bronchoscopy. Commensurate reduction of the rightward shift of the mediastinal structures. 2. Support apparatus appears stable in position. Electronically Signed   By: Franki Cabot M.D.   On: 01/03/2018 12:32   Dg Chest Port 1 View  Result Date: 01/03/2018 CLINICAL DATA:  Central line placement. EXAM: PORTABLE CHEST 1 VIEW COMPARISON:  Chest radiograph performed 01/02/2018 FINDINGS: The patient's endotracheal tube is seen ending 4-5 cm above the carina. The balloon of the endotracheal tube may be slightly overinflated. The left IJ line is noted ending about the proximal SVC. An enteric tube is noted extending below the diaphragm. There is right-sided volume loss, with apparent rightward shift of the mediastinum. A small right pleural effusion is suspected. The left lung appears clear. No pneumothorax is seen. The cardiomediastinal silhouette is borderline normal in size. External pacing pads are seen. No acute osseous abnormalities are identified. IMPRESSION: 1. Left IJ line noted ending about the proximal SVC. 2. Endotracheal tube seen ending 4-5 cm above the carina. The balloon of the endotracheal tube may be slightly overinflated. 3. Right-sided volume loss, with apparent rightward shift of the mediastinum. Small right pleural effusion noted. Electronically Signed   By: Garald Balding M.D.   On: 01/03/2018 03:20   Portable Chest X-ray  Result Date: 01/02/2018 CLINICAL DATA:  84 y/o  M; intubation. EXAM: PORTABLE CHEST 1 VIEW COMPARISON:  01/02/2018 chest radiograph. FINDINGS: Patient is rotated. Stable cardiac silhouette given projection and technique. Transcutaneous pacing pads. Stable reticular opacities. Right lung base linear opacity, likely atelectasis. Improved aeration of left lung base. Bones are unremarkable.  Endotracheal tube tip projects 5.9 cm above carina. Enteric tube tip is in the proximal stomach. IMPRESSION: Endotracheal tube tip projects 5.9 cm above carina. Enteric tube tip is in proximal  stomach. Persistent reticular opacities, probably edema. Improved aeration of left lung base and new platelike atelectasis at right lung base. Electronically Signed   By: Kristine Garbe M.D.   On: 01/02/2018 21:14   Dg Chest Port 1 View  Result Date: 01/02/2018 CLINICAL DATA:  84 y/o  M; respiratory distress. EXAM: PORTABLE CHEST 1 VIEW COMPARISON:  01/02/2018 chest radiograph. FINDINGS: Stable cardiomegaly given projection and technique. Diffuse reticular opacities of the lungs. Persistent left lower lobe opacity. No pneumothorax. Bones are unremarkable. IMPRESSION: Increased reticular opacities of the lungs probably representing interstitial pulmonary edema. Persistent left basilar opacity which may represent effusion and atelectasis, less likely pneumonia. Electronically Signed   By: Kristine Garbe M.D.   On: 01/02/2018 20:26   Dg Chest Port 1 View  Result Date: 01/02/2018 CLINICAL DATA:  Confusion altered mental status EXAM: PORTABLE CHEST 1 VIEW COMPARISON:  09/10/2017 FINDINGS: Heart size mildly enlarged. Negative for heart failure. Mild left lower lobe atelectasis/infiltrate. No effusion. Epidural anesthetic catheter overlies the right chest. IMPRESSION: Mild left lower lobe atelectasis/infiltrate. Cardiac enlargement without heart failure. Electronically Signed   By: Franchot Gallo M.D.   On: 01/02/2018 11:33   Dg Abd Portable 1v  Result Date: 12/31/2017 CLINICAL DATA:  NGT placement EXAM: PORTABLE ABDOMEN - 1 VIEW COMPARISON:  12/16/2017 CT FINDINGS: Tip and side port of a gastric tube are noted in the left upper quadrant of the abdomen presumably in the expected location of the stomach. Left-sided approach catheter is also noted with tip projecting over the lower lumbar spine at the level  of L5. Midline skin staples are present as well as [name] sutures in the left upper quadrant. Stool is noted within large bowel without bowel obstruction. No apparent free air. IMPRESSION: Gastric tube is seen in the left upper quadrant in the expected location of the stomach. Electronically Signed   By: Ashley Royalty M.D.   On: 12/31/2017 00:05   Dg Ugi W/high Density W/kub  Result Date: 01/08/2018 CLINICAL DATA:  Postop day 9 partial gastrectomy for cancer. Nausea. Vomiting. EXAM: WATER SOLUBLE UPPER GI SERIES TECHNIQUE: Single-column upper GI series was performed using water soluble contrast. CONTRAST:  100 mL Isovue-300 orally COMPARISON:  12/30/2017, CT 12/16/2017 FLUOROSCOPY TIME:  Fluoroscopy Time:  3 minutes 0 seconds Radiation Exposure Index (if provided by the fluoroscopic device): Number of Acquired Spot Images: 0 FINDINGS: Preliminary KUB demonstrates normal bowel gas pattern. Jejunostomy feeding tube is present in the left abdomen. Decreased esophageal motility. Mild esophageal dilatation. Contrast passed into the stomach which has been partially resected. Gastric jejunostomy is patent. No leak. No obstruction of the stomach. Jejunum is nondilated. Mild gastroesophageal reflux IMPRESSION: Postop partial gastrectomy with gastrojejunostomy. No leak or obstruction identified Decreased esophageal peristalsis.  Mild gastroesophageal reflux. Electronically Signed   By: Franchot Gallo M.D.   On: 01/08/2018 13:12     LOS: 14 days   Signature  Lala Lund M.D on 01/13/2018 at 11:16 AM  Between 7am to 7pm - Pager - [phone] ( page via [email or web address], text pages only, please mention full 10 digit call back number).  After 7pm go to [email or web address] - password [email or web address]

## 2018-01-13 NOTE — Progress Notes (Signed)
Patient ID: Jacob Bowens., male   DOB: [birth year], 84 y.o.   MRN: [id number]  Just informed that patient's hemoglobin returned this morning at 5.6.  He is stable.  Likely has some bleeding from suture or staple lines.  Will hold Lovenox.  Transfuse and observe closely.

## 2018-01-13 NOTE — Progress Notes (Signed)
Patient previously refused blood draw for Type and Screen;  RN discussed plan of care with patient; patient verbalizes understanding of necessity and agrees to lab draw.  Additionally, patient is refusing staple removal at this time.  Patient states, "Please, I just can't do it right now."  Patient agreeable to possibly allowing RN to remove staples after blood transfusion.  Will attempt again later today.

## 2018-01-13 NOTE — Progress Notes (Signed)
   01/13/18 1528  Incision (Closed) 12/30/17 Abdomen Other (Comment)  Date First Assessed/Time First Assessed: 12/30/17 1008   Location: Abdomen  Location Orientation: Other (Comment)  Dressing Type Benzoine;Adhesive strips  Dressing Clean;Dry;Intact  Site / Wound Assessment Clean;Dry  Margins Attached edges (approximated)  Closure Adhesive strips (Staples removed (19))  Drainage Amount None  Incision - 1 Port Abdomen Left  Placement Date/Time: 12/30/17 0830   Location of Ports: Abdomen  Location Orientation: Left  Port 1 Site Assessment Clean;Dry  Port 1 Margins Attached edges (approximated)  Port 1 Drainage Amount None  Port 1 Dressing Type Benzoine;Adhesive strips (Staples Removed (2))  Port 1 Dressing Status Clean;Dry;Intact

## 2018-01-14 LAB — GLUCOSE, CAPILLARY
GLUCOSE-CAPILLARY: 110 mg/dL — AB (ref 65–99)
GLUCOSE-CAPILLARY: 114 mg/dL — AB (ref 65–99)
GLUCOSE-CAPILLARY: 88 mg/dL (ref 65–99)
Glucose-Capillary: 144 mg/dL — ABNORMAL HIGH (ref 65–99)
Glucose-Capillary: 108 mg/dL — ABNORMAL HIGH (ref 65–99)
Glucose-Capillary: 121 mg/dL — ABNORMAL HIGH (ref 65–99)
Glucose-Capillary: 94 mg/dL (ref 65–99)

## 2018-01-14 NOTE — Progress Notes (Signed)
PROGRESS NOTE    [name].  JAS:[id number] DOB: Jul 27, 1933 DOA: 12/30/2017 PCP: Eulas Post, MD   Brief Narrative: Patient is a 84 y.o. male who underwent diagnostic laparoscopy, open distal gastrectomy with Billroth II anastomosis and placement of a jejunostomy tube on 3/5 due to adenocarcinoma of the stomach-subsequent hospital course complicated by confusion, and subsequently developed hypotension and respiratory failure due to right lower lobe infiltrate/collapse due to mucous plugging requiring intubation.  Patient was provided supportive care, and subsequently improved and transferred back to a medical floor.  Hospitalist service was consulted on 3/14 for assistant and ongoing intermittent confusion.    Assessment & Plan:   Active Problems:   Gastric cancer ([place])   Acute respiratory failure ([place])   HCAP (healthcare-associated pneumonia)  Acute metabolic encephalopathy/delirium: Looks like resolved.  Patient is alert and oriented during my evaluation this morning.  CT head unremarkable.  Continue supportive care.  Minimize narcotics/benzodiazepines.  Exposure to sunlight.encourage sitting up in chair and monitor.  He certainly remains at risk for delirium due to his age in hospital setting  Hypernatremia: We will check sodium level tomorrow.  Continue free water  Combined chronic systolic/diastolic heart failure: Currently dehydrated.  Diuretics on hold  Aspiration pneumonia: Completed antibiotics course.  Respiratory status stable.  Gastric cancer status post diagnostic laparoscopy with open distal gastrectomy with Billroth II-placement of a jejunostomy tube: Postop issues-deferred to the primary service.  Perioperative blood loss related anemia: Currently H&H stable.  Status post 2 units of PRBC transfusion on 01/13/18.  No signs of ongoing GI bleed    DVT prophylaxis:SCD Code Status: Full Family Communication: None present at the bedside Disposition Plan:  As per surgery   Procedures:iagnostic laparoscopy with open distal gastrectomy with Billroth II-placement of a jejunostomy tube  Antimicrobials:None  Subjective:  Patient seen and examined at bedside this morning.  Remains comfortable.  Denies any complaints.  Currently alert and oriented.  Continues to have generalized weakness. Objective: Vitals:   01/13/18 2144 01/14/18 0607 01/14/18 0710 01/14/18 0729  BP: (!) 120/52 (!) 121/59  (!) 116/57  Pulse: 96 94  (!) 102  Resp: 17 16  16   Temp: 98.2 F (36.8 C) 98.7 F (37.1 C)    TempSrc: Oral Oral    SpO2: 100% 98%  99%  Weight:   82.7 kg (182 lb 5.1 oz)   Height:        Intake/Output Summary (Last 24 hours) at 01/14/2018 1337 Last data filed at 01/14/2018 1000 Gross per 24 hour  Intake 2401 ml  Output -  Net 2401 ml   Filed Weights   01/12/18 0418 01/13/18 0408 01/14/18 0710  Weight: 80.1 kg (176 lb 9.4 oz) 80.8 kg (178 lb 2.1 oz) 82.7 kg (182 lb 5.1 oz)    Examination:  General exam: Appears calm and comfortable ,Not in distress,average built HEENT:PERRL,Oral mucosa moist, Ear/Nose normal on gross exam Respiratory system: Bilateral equal air entry, normal vesicular breath sounds, no wheezes or crackles  Cardiovascular system: S1 & S2 heard, RRR. No JVD, murmurs, rubs, gallops or clicks. No pedal edema. Gastrointestinal system: Abdomen is nondistended, soft and nontender. No organomegaly or masses felt. Normal bowel sounds heard. J tube, clean midline surgical wound Central nervous system: Alert and oriented. No focal neurological deficits. Extremities: No edema, no clubbing ,no cyanosis, distal peripheral pulses palpable. Skin: No rashes, lesions or ulcers,no icterus ,no pallor MSK: Normal muscle bulk,tone ,power Psychiatry: Judgement and insight appear normal. Mood & affect  appropriate.     Data Reviewed: I have personally reviewed following labs and imaging studies  CBC: Recent Labs  Lab 01/08/18 0502  01/09/18 0606 01/10/18 0508 01/11/18 0428 01/13/18 0829 01/13/18 1037 01/13/18 2042  WBC 4.8 4.1 6.6 5.8 6.2  --   --   HGB 9.1* 8.8* 8.6* 8.2* 5.6* 6.0* 9.5*  HCT 28.9* 28.6* 28.7* 27.6* 18.8* 20.1* 31.0*  MCV 95.7 96.6 96.6 98.9 100.5*  --   --   PLT 170 181 211 210 234  --   --    Basic Metabolic Panel: Recent Labs  Lab 01/09/18 0606 01/10/18 0508 01/11/18 0428 01/12/18 0445 01/13/18 0829  NA 154* 152* 148* 144 146*  K 3.4* 3.6 3.9 4.4 4.6  CL 114* 114* 114* 113* 113*  CO2 28 25 24 24 24   GLUCOSE 113* 123* 116* 107* 116*  BUN 25* 43* 37* 38* 51*  CREATININE 1.21 1.16 1.12 1.05 1.07  CALCIUM 9.2 9.1 8.6* 8.5* 8.4*  MG  --   --   --  2.3  --    GFR: Estimated Creatinine Clearance: 58.4 mL/min (by C-G formula based on SCr of 1.07 mg/dL). Liver Function Tests: Recent Labs  Lab 01/13/18 0829  AST 22  ALT 20  ALKPHOS 46  BILITOT 0.4  PROT 5.1*  ALBUMIN 3.2*   No results for input(s): LIPASE, AMYLASE in the last 168 hours. No results for input(s): AMMONIA in the last 168 hours. Coagulation Profile: No results for input(s): INR, PROTIME in the last 168 hours. Cardiac Enzymes: No results for input(s): CKTOTAL, CKMB, CKMBINDEX, TROPONINI in the last 168 hours. BNP (last 3 results) No results for input(s): PROBNP in the last 8760 hours. HbA1C: No results for input(s): HGBA1C in the last 72 hours. CBG: Recent Labs  Lab 01/13/18 1939 01/14/18 0004 01/14/18 0426 01/14/18 0726 01/14/18 1208  GLUCAP 144* 114* 108* 121* 88   Lipid Profile: No results for input(s): CHOL, HDL, LDLCALC, TRIG, CHOLHDL, LDLDIRECT in the last 72 hours. Thyroid Function Tests: No results for input(s): TSH, T4TOTAL, FREET4, T3FREE, THYROIDAB in the last 72 hours. Anemia Panel: No results for input(s): VITAMINB12, FOLATE, FERRITIN, TIBC, IRON, RETICCTPCT in the last 72 hours. Sepsis Labs: No results for input(s): PROCALCITON, LATICACIDVEN in the last 168 hours.  No results found for  this or any previous visit (from the past 240 hour(s)).       Radiology Studies: No results found.      Scheduled Meds: . carvedilol  6.25 mg Per Tube BID WC  . chlorhexidine  15 mL Mouth Rinse BID  . Chlorhexidine Gluconate Cloth  6 each Topical Daily  . feeding supplement (ENSURE ENLIVE)  237 mL Oral BID BM  . free water  250 mL Per Tube Q6H  . mouth rinse  15 mL Mouth Rinse q12n4p  . pantoprazole  40 mg Oral BID  . potassium chloride  40 mEq Per Tube Daily   Continuous Infusions: . sodium chloride    . feeding supplement (OSMOLITE 1.2 CAL) 1,000 mL (01/13/18 2236)     LOS: 15 days    Time spent: More than 50% of that time was spent in counseling and/or coordination of care.      Shelly Coss, MD [facility] Pager [phone]  If 7PM-7AM, please contact night-coverage [email or web address] Password [facility] 01/14/2018, 1:37 PM

## 2018-01-14 NOTE — Progress Notes (Signed)
Calorie Count Note  48 hour calorie count ordered. Day 1 results below.  Diet: Soft diet now. Was on FLD 3/19. Supplements:  -Ensure Enlive po BID, each supplement provides 350 kcal and 20 grams of protein  3/19:  Breakfast: 0% Lunch: 0%  Dinner: 70 kcal Supplements: 350 kcal, 20g protein  Total intake: 420 kcal (18% of minimum estimated needs)  20g protein (16% of minimum estimated needs)  Nutrition Dx:  Increased nutrient needs related to catabolic illness, cancer and cancer related treatments, wound healing as evidenced by estimated needs.  Goal: Pt to meet >/= 90% of their estimated nutrition needs   Intervention:  -Continue Calorie Count now that pt is on a soft diet -Continue TF via J-tube  Clayton Bibles, MS, RD, LDN [place] Dietitian Pager: [phone] After Hours Pager: [phone]

## 2018-01-14 NOTE — Progress Notes (Signed)
Physical Therapy Treatment Patient Details Name: [name]. MRN: [id number] DOB: October 30, 1932 Today's Date: 01/14/2018    History of Present Illness 84 yo male admitted 12/30/17 with recurrent gastric cancer. S/P DIAGNOSTIC LAPAROSCOPY WITH OPEN DISTAL GASTRECTOMY AND PLACEMENT JEJUNOSTOMY FEEDING TUBE (N/A) - EPIDURAL Required Intubation for ;ow BP, increased confusion over weekend.    PT Comments    Assisted OOB to [facility] required increased time pt c/o MAX fatigue and weakness.  Pt still present with loose, dark stools and poor po intake.  Amb a limited distance.  amb past the standing scale so assisted on/off weight was 170.4 Assisted back to bed per pt request.   Follow Up Recommendations  SNF     Equipment Recommendations  None recommended by PT    Recommendations for Other Services       Precautions / Restrictions Precautions Precautions: Fall Precaution Comments: PEG Restrictions Weight Bearing Restrictions: No    Mobility  Bed Mobility Overal bed mobility: Needs Assistance Bed Mobility: Supine to Sit;Sit to Supine     Supine to sit: Supervision Sit to supine: Min guard   General bed mobility comments: increased assist back to bed   Transfers Overall transfer level: Needs assistance Equipment used: Rolling walker (2 wheeled) Transfers: Sit to/from [facility] Sit to Stand: Min guard Stand pivot transfers: Min guard       General transfer comment: close guard for safety. VCs hand placement  assisted on/off BSC  Ambulation/Gait Ambulation/Gait assistance: Min guard Ambulation Distance (Feet): 85 Feet Assistive device: Rolling walker (2 wheeled) Gait Pattern/deviations: Step-through pattern Gait velocity: decreased   General Gait Details: mildly unsteady during ambulation-still requires walker.    Stairs            Wheelchair Mobility    Modified Rankin (Stroke Patients Only)       Balance                                             Cognition Arousal/Alertness: Awake/alert Behavior During Therapy: WFL for tasks assessed/performed Overall Cognitive Status: Within Functional Limits for tasks assessed                                 General Comments: c/o feeling tired and discouraged about his slow progress       Exercises      General Comments        Pertinent Vitals/Pain Pain Assessment: Faces Faces Pain Scale: Hurts a little bit Pain Location: abdomen "just got my staples out"  Pain Descriptors / Indicators: Discomfort;Grimacing Pain Intervention(s): Monitored during session    Home Living                      Prior Function            PT Goals (current goals can now be found in the care plan section) Progress towards PT goals: Progressing toward goals    Frequency    Min 2X/week      PT Plan Current plan remains appropriate    Co-evaluation              AM-PAC PT "6 Clicks" Daily Activity  Outcome Measure  Difficulty turning over in bed (including adjusting bedclothes, sheets and blankets)?: A Little Difficulty moving from lying  on back to sitting on the side of the bed? : A Little Difficulty sitting down on and standing up from a chair with arms (e.g., wheelchair, bedside commode, etc,.)?: A Little Help needed moving to and from a bed to chair (including a wheelchair)?: A Little Help needed walking in hospital room?: A Little Help needed climbing 3-5 steps with a railing? : A Lot 6 Click Score: 17    End of Session Equipment Utilized During Treatment: Gait belt Activity Tolerance: Patient tolerated treatment well Patient left: in bed Nurse Communication: Mobility status PT Visit Diagnosis: Unsteadiness on feet (R26.81);Muscle weakness (generalized) (M62.81)     Time: 4680-3212 PT Time Calculation (min) (ACUTE ONLY): 24 min  Charges:  $Gait Training: 8-22 mins $Therapeutic Activity: 8-22 mins                     G Codes:       Rica Koyanagi  PTA [facility]  Acute  Rehab Pager      [phone]

## 2018-01-14 NOTE — Progress Notes (Signed)
Physical Therapy Treatment Patient Details Name: [name]. MRN: [id number] DOB: June 16, 1933 Today's Date: 01/14/2018    History of Present Illness 84 yo male admitted 12/30/17 with recurrent gastric cancer. S/P DIAGNOSTIC LAPAROSCOPY WITH OPEN DISTAL GASTRECTOMY AND PLACEMENT JEJUNOSTOMY FEEDING TUBE (N/A) - EPIDURAL Required Intubation for ;ow BP, increased confusion over weekend.    PT Comments    Returned as requested by pt to "do more".  Assisted OOB to [facility] present with more loose, black stools.  Assisted with amb in hallway a little further then back to bed.  Talked a lot about need to increase po intake for physical strength.  Pt stated he is "just not interested in eating".    Follow Up Recommendations  SNF     Equipment Recommendations  None recommended by PT    Recommendations for Other Services       Precautions / Restrictions Precautions Precautions: Fall Precaution Comments: PEG Restrictions Weight Bearing Restrictions: No    Mobility  Bed Mobility Overal bed mobility: Needs Assistance Bed Mobility: Supine to Sit;Sit to Supine     Supine to sit: Supervision Sit to supine: Min guard   General bed mobility comments: increased assist back to bed   Transfers Overall transfer level: Needs assistance Equipment used: Rolling walker (2 wheeled) Transfers: Sit to/from [facility] Sit to Stand: Min guard Stand pivot transfers: Min guard       General transfer comment: close guard for safety. VCs hand placement  assisted on/off BSC  Ambulation/Gait Ambulation/Gait assistance: Min guard Ambulation Distance (Feet): 110 Feet Assistive device: Rolling walker (2 wheeled) Gait Pattern/deviations: Step-through pattern Gait velocity: decreased   General Gait Details: mildly unsteady during ambulation-still requires walker.    Stairs            Wheelchair Mobility    Modified Rankin (Stroke Patients Only)       Balance                                             Cognition Arousal/Alertness: Awake/alert Behavior During Therapy: WFL for tasks assessed/performed Overall Cognitive Status: Within Functional Limits for tasks assessed                                 General Comments: c/o feeling tired and discouraged about his slow progress       Exercises      General Comments        Pertinent Vitals/Pain Pain Assessment: Faces Faces Pain Scale: Hurts a little bit Pain Location: abdomen "just got my staples out"  Pain Descriptors / Indicators: Discomfort;Grimacing Pain Intervention(s): Monitored during session    Home Living                      Prior Function            PT Goals (current goals can now be found in the care plan section) Progress towards PT goals: Progressing toward goals    Frequency    Min 2X/week      PT Plan Current plan remains appropriate    Co-evaluation              AM-PAC PT "6 Clicks" Daily Activity  Outcome Measure  Difficulty turning over in bed (including adjusting bedclothes, sheets and  blankets)?: A Little Difficulty moving from lying on back to sitting on the side of the bed? : A Little Difficulty sitting down on and standing up from a chair with arms (e.g., wheelchair, bedside commode, etc,.)?: A Little Help needed moving to and from a bed to chair (including a wheelchair)?: A Little Help needed walking in hospital room?: A Little Help needed climbing 3-5 steps with a railing? : A Lot 6 Click Score: 17    End of Session Equipment Utilized During Treatment: Gait belt Activity Tolerance: Patient tolerated treatment well Patient left: in bed Nurse Communication: Mobility status PT Visit Diagnosis: Unsteadiness on feet (R26.81);Muscle weakness (generalized) (M62.81)     Time: 8251-8984 PT Time Calculation (min) (ACUTE ONLY): 24 min  Charges:  $Gait Training: 8-22 mins $Therapeutic Activity: 8-22  mins                    G Codes:       Rica Koyanagi  PTA [facility]  Acute  Rehab Pager      [phone]

## 2018-01-14 NOTE — Progress Notes (Signed)
Patient ID: [name]., male   DOB: 1933-08-18, 84 y.o.   MRN: [id number] 15 Days Post-Op   Subjective: In better spirits today.  Tolerated full liquids without much nausea and no vomiting.  States bowel movements have slowed and are not as dark.  Denies abdominal pain.  Objective: Vital signs in last 24 hours: Temp:  [97.5 F (36.4 C)-98.7 F (37.1 C)] 98.7 F (37.1 C) (03/20 0607) Pulse Rate:  [81-104] 102 (03/20 0729) Resp:  [15-18] 16 (03/20 0729) BP: (98-123)/(50-59) 116/57 (03/20 0729) SpO2:  [98 %-100 %] 99 % (03/20 0729) Weight:  [82.7 kg (182 lb 5.1 oz)] 82.7 kg (182 lb 5.1 oz) (03/20 0710) Last BM Date: 01/13/18  Intake/Output from previous day: 03/19 0701 - 03/20 0700 In: 1721 [P.O.:240; Blood:315; NG/GT:1166] Out: -  Intake/Output this shift: No intake/output data recorded.  General appearance: alert, cooperative, fatigued and no distress GI: normal findings: soft, non-tender and Nondistended Incision/Wound: Staples removed.  No erythema or drainage.  Lab Results:  Recent Labs    01/13/18 0829 01/13/18 1037 01/13/18 2042  WBC 6.2  --   --   HGB 5.6* 6.0* 9.5*  HCT 18.8* 20.1* 31.0*  PLT 234  --   --    BMET Recent Labs    01/12/18 0445 01/13/18 0829  NA 144 146*  K 4.4 4.6  CL 113* 113*  CO2 24 24  GLUCOSE 107* 116*  BUN 38* 51*  CREATININE 1.05 1.07  CALCIUM 8.5* 8.4*     Studies/Results: No results found.  Anti-infectives: Anti-infectives (From admission, onward)   Start     Dose/Rate Route Frequency Ordered Stop   01/05/18 1000  vancomycin (VANCOCIN) IVPB 1000 mg/200 mL premix  Status:  Discontinued     1,000 mg 200 mL/hr over 60 Minutes Intravenous Every 24 hours 01/04/18 0956 01/05/18 1319   01/04/18 1030  vancomycin (VANCOCIN) 1,500 mg in sodium chloride 0.9 % 500 mL IVPB     1,500 mg 250 mL/hr over 120 Minutes Intravenous  Once 01/04/18 0956 01/04/18 1314   01/03/18 0200  piperacillin-tazobactam (ZOSYN) IVPB 3.375 g   Status:  Discontinued     3.375 g 12.5 mL/hr over 240 Minutes Intravenous Every 8 hours 01/03/18 0112 01/11/18 1002   12/30/17 1714  ceFAZolin (ANCEF) 2-4 GM/100ML-% IVPB    Comments:  Otelia Sergeant   : cabinet override      12/30/17 1714 12/31/17 0529   12/30/17 1600  ceFAZolin (ANCEF) IVPB 2g/100 mL premix     2 g 200 mL/hr over 30 Minutes Intravenous Every 8 hours 12/30/17 1356 12/30/17 1747   12/30/17 0545  ceFAZolin (ANCEF) 2-4 GM/100ML-% IVPB    Comments:  Waldron Session   : cabinet override      12/30/17 0545 12/30/17 0756   12/30/17 0542  ceFAZolin (ANCEF) IVPB 2g/100 mL premix     2 g 200 mL/hr over 30 Minutes Intravenous On call to O.R. 12/30/17 0542 12/30/17 0826      Assessment/Plan: s/p Procedure(s): DIAGNOSTIC LAPAROSCOPY WITH OPEN DISTAL GASTRECTOMY AND PLACEMENT JEJUNOSTOMY FEEDING TUBE Postoperative pneumonia and VDR F, resolved Postoperative nausea and vomiting which appears to have resolved.  Advance to soft diet Delayed postoperative bleeding apparent yesterday.  Hemoglobin increased appropriately after 2 unit PRBC transfusion.  Lovenox and aspirin are held.  Likely staple line bleed. Overall looks better today.   LOS: 15 days    Edward Jolly 01/14/2018

## 2018-01-14 NOTE — Clinical Social Work Placement (Addendum)
   CLINICAL SOCIAL WORK PLACEMENT  NOTE  Date:  01/14/2018  Patient Details  Name: [name]. MRN: [id number] Date of Birth: 10-27-1933  Clinical Social Work is seeking post-discharge placement for this patient at the [place] level of care (*CSW will initial, date and re-position this form in  chart as items are completed):  Yes   Patient/family provided with [place] Work Department's list of facilities offering this level of care within the geographic area requested by the patient (or if unable, by the patient's family).  Yes   Patient/family informed of their freedom to choose among providers that offer the needed level of care, that participate in Medicare, Medicaid or managed care program needed by the patient, have an available bed and are willing to accept the patient.  Yes   Patient/family informed of [place]'s ownership interest in [facility] and [facility], as well as of the fact that they are under no obligation to receive care at these facilities.  PASRR submitted to EDS on    3/14/ 2019  PASRR number received on     01/08/2018  Existing PASRR number confirmed on       FL2 transmitted to all facilities in geographic area requested by pt/family on       FL2 transmitted to all facilities within larger geographic area on       Patient informed that his/her managed care company has contracts with or will negotiate with certain facilities, including the following:  [employer] informed of bed offers received.  Patient chooses bed at [facility]     Physician recommends and patient chooses bed at      Patient to be transferred to [facility] on  .  Patient to be transferred to facility by       Patient family notified on   of transfer.  Name of family member notified:        PHYSICIAN       Additional Comment:     _______________________________________________ Lia Hopping, LCSW 01/14/2018, 10:24 AM

## 2018-01-15 LAB — GLUCOSE, CAPILLARY
GLUCOSE-CAPILLARY: 106 mg/dL — AB (ref 65–99)
GLUCOSE-CAPILLARY: 112 mg/dL — AB (ref 65–99)
GLUCOSE-CAPILLARY: 112 mg/dL — AB (ref 65–99)
GLUCOSE-CAPILLARY: 115 mg/dL — AB (ref 65–99)
Glucose-Capillary: 119 mg/dL — ABNORMAL HIGH (ref 65–99)
Glucose-Capillary: 97 mg/dL (ref 65–99)

## 2018-01-15 NOTE — Progress Notes (Signed)
Pt has agreed to let lab draw blood from him "one more time, as long as its not 430 in the morning." Pt said after 600 would be great.

## 2018-01-15 NOTE — Progress Notes (Signed)
Calorie Count Note  48 hour calorie count ordered. Day 2 results below.  Diet: Soft diet Supplements:  -Ensure Enlive po BID, each supplement provides 350 kcal and 20 grams of protein  3/20: Breakfast: 0% Lunch: 0% Dinner: 80 kcal, 3g protein Supplements: 350 kcal, 20g protein  Total intake: 430 kcal (18% of minimum estimated needs)  23g protein (18% of minimum estimated needs)  Nutrition Dx:  Increased nutrient needsrelated to catabolic illness, cancer and cancer related treatments, wound healingas evidenced by estimated needs.  Goal: Pt to meet >/= 90% of their estimated nutrition needs   Intervention:  -Continue TF via J-tube -Continue Ensure Enlive po BID, each supplement provides 350 kcal and 20 grams of protein  Clayton Bibles, MS, RD, LDN [place] Dietitian Pager: [phone] After Hours Pager: [phone]

## 2018-01-15 NOTE — Progress Notes (Signed)
Patient ID: [name]., male   DOB: [birth year], 84 y.o.   MRN: [id number] 16 Days Post-Op   Subjective: Feels "bad" this morning.  Nausea and reports vomiting earlier this morning.  Denies abdominal pain.  Objective: Vital signs in last 24 hours: Temp:  [98.2 F (36.8 C)-98.8 F (37.1 C)] 98.8 F (37.1 C) (03/21 0532) Pulse Rate:  [88-100] 88 (03/21 0532) Resp:  [16-17] 16 (03/21 0532) BP: (113-126)/(40-64) 113/55 (03/21 0532) SpO2:  [98 %-100 %] 99 % (03/21 0532) Weight:  [77.2 kg (170 lb 4 oz)-78.2 kg (172 lb 6.4 oz)] 78.2 kg (172 lb 6.4 oz) (03/21 0500) Last BM Date: 01/15/18  Intake/Output from previous day: 03/20 0701 - 03/21 0700 In: 2360 [P.O.:670; NG/GT:1440] Out: 551 [Urine:551] Intake/Output this shift: Total I/O In: 500 [P.O.:250; Other:250] Out: -   General appearance: alert, cooperative, fatigued and no distress GI: Soft and nontender and nondistended Incision/Wound: Clean and dry  Lab Results:  Recent Labs    01/13/18 0829 01/13/18 1037 01/13/18 2042  WBC 6.2  --   --   HGB 5.6* 6.0* 9.5*  HCT 18.8* 20.1* 31.0*  PLT 234  --   --    BMET Recent Labs    01/13/18 0829  NA 146*  K 4.6  CL 113*  CO2 24  GLUCOSE 116*  BUN 51*  CREATININE 1.07  CALCIUM 8.4*     Studies/Results: No results found.  Anti-infectives: Anti-infectives (From admission, onward)   Start     Dose/Rate Route Frequency Ordered Stop   01/05/18 1000  vancomycin (VANCOCIN) IVPB 1000 mg/200 mL premix  Status:  Discontinued     1,000 mg 200 mL/hr over 60 Minutes Intravenous Every 24 hours 01/04/18 0956 01/05/18 1319   01/04/18 1030  vancomycin (VANCOCIN) 1,500 mg in sodium chloride 0.9 % 500 mL IVPB     1,500 mg 250 mL/hr over 120 Minutes Intravenous  Once 01/04/18 0956 01/04/18 1314   01/03/18 0200  piperacillin-tazobactam (ZOSYN) IVPB 3.375 g  Status:  Discontinued     3.375 g 12.5 mL/hr over 240 Minutes Intravenous Every 8 hours 01/03/18 0112 01/11/18 1002    12/30/17 1714  ceFAZolin (ANCEF) 2-4 GM/100ML-% IVPB    Note to Pharmacy:  Otelia Sergeant   : cabinet override      12/30/17 1714 12/31/17 0529   12/30/17 1600  ceFAZolin (ANCEF) IVPB 2g/100 mL premix     2 g 200 mL/hr over 30 Minutes Intravenous Every 8 hours 12/30/17 1356 12/30/17 1747   12/30/17 0545  ceFAZolin (ANCEF) 2-4 GM/100ML-% IVPB    Note to Pharmacy:  Waldron Session   : cabinet override      12/30/17 0545 12/30/17 0756   12/30/17 0542  ceFAZolin (ANCEF) IVPB 2g/100 mL premix     2 g 200 mL/hr over 30 Minutes Intravenous On call to O.R. 12/30/17 0542 12/30/17 0826      Assessment/Plan: s/p Procedure(s): DIAGNOSTIC LAPAROSCOPY WITH OPEN DISTAL GASTRECTOMY AND PLACEMENT JEJUNOSTOMY FEEDING TUBE Postoperative ventilator dependent respiratory failure pneumonia, resolved Delayed GI bleed, apparently resolved, hemoglobin improved with transfusion and Lovenox held Persistent nausea.  Had been doing better with his diet for the last couple of days but this has recurred.  Probably delayed gastric emptying.  Continue tube feeding.  May need to be discharged on tube feeding.   LOS: 16 days    Edward Jolly 01/15/2018

## 2018-01-15 NOTE — Progress Notes (Signed)
PROGRESS NOTE    [name].  QQI:[id number] DOB: 07-Sep-1933 DOA: 12/30/2017 PCP: Eulas Post, MD   Brief Narrative: Patient is a 84 y.o. male who underwent diagnostic laparoscopy, open distal gastrectomy with Billroth II anastomosis and placement of a jejunostomy tube on 3/5 due to adenocarcinoma of the stomach-subsequent hospital course complicated by confusion, and subsequently developed hypotension and respiratory failure due to right lower lobe infiltrate/collapse due to mucous plugging requiring intubation.  Patient was provided supportive care, and subsequently improved and transferred back to the medical floor.  Hospitalist service was consulted on 3/14 for assistant and ongoing intermittent confusion.    Assessment & Plan:   Active Problems:   Gastric cancer ([place])   Acute respiratory failure ([place])   HCAP (healthcare-associated pneumonia)  Acute metabolic encephalopathy/delirium: Looks like resolved.  Patient is alert and oriented during my evaluation this morning.  CT head unremarkable.  Continue supportive care.  Minimize narcotics/benzodiazepines.  Exposure to sunlight.encourage sitting up in chair and monitor.  He certainly remains at risk for delirium due to his age in hospital setting  Hypernatremia: We will check sodium level.  Continue free water.BMP hasnot been drawn today.  Combined chronic systolic/diastolic heart failure: Currently dehydrated.  Diuretics on hold  Aspiration pneumonia: Completed antibiotics course.  Respiratory status stable.  Gastric cancer status post diagnostic laparoscopy with open distal gastrectomy with Billroth II-placement of a jejunostomy tube: Postop issues-deferred to the primary service.  Perioperative blood loss related anemia: Currently H&H stable.  Status post 2 units of PRBC transfusion on 01/13/18.  No signs of ongoing GI bleed    DVT prophylaxis:SCD Code Status: Full Family Communication: None present at the  bedside Disposition Plan: As per surgery   Procedures:iagnostic laparoscopy with open distal gastrectomy with Billroth II-placement of a jejunostomy tube  Antimicrobials:None  Subjective:  Patient seen and examined the bedside this morning.  He was having issues with nausea and vomiting this morning and says he feels terrible.  Objective: Vitals:   01/14/18 2213 01/15/18 0500 01/15/18 0532 01/15/18 1334  BP: 126/61  (!) 113/55 120/60  Pulse:   88 76  Resp:   16 16  Temp:   98.8 F (37.1 C) 98.6 F (37 C)  TempSrc:   Oral Oral  SpO2:   99% 98%  Weight:  78.2 kg (172 lb 6.4 oz)    Height:        Intake/Output Summary (Last 24 hours) at 01/15/2018 1340 Last data filed at 01/15/2018 1306 Gross per 24 hour  Intake 2360 ml  Output 1050 ml  Net 1310 ml   Filed Weights   01/14/18 0710 01/14/18 1438 01/15/18 0500  Weight: 82.7 kg (182 lb 5.1 oz) 77.2 kg (170 lb 4 oz) 78.2 kg (172 lb 6.4 oz)    Examination:  General exam: Not in obvious distress,average built, generalized weakness HEENT:PERRL,Oral mucosa moist, Ear/Nose normal on gross exam Respiratory system: Bilateral equal air entry, normal vesicular breath sounds, no wheezes or crackles  Cardiovascular system: S1 & S2 heard, RRR. No JVD, murmurs, rubs, gallops or clicks. Gastrointestinal system: Abdomen is nondistended, soft and nontender. No organomegaly or masses felt. Normal bowel sounds heard. J tube, clean midline surgical wound Central nervous system: Alert and oriented. No focal neurological deficits. Extremities: No edema, no clubbing ,no cyanosis, distal peripheral pulses palpable. Skin: No rashes, lesions or ulcers,no icterus ,no pallor MSK: Normal muscle bulk,tone ,power Psychiatry: Judgement and insight appear normal. Mood & affect appropriate.  Data Reviewed: I have personally reviewed following labs and imaging studies  CBC: Recent Labs  Lab 01/09/18 0606 01/10/18 0508 01/11/18 0428  01/13/18 0829 01/13/18 1037 01/13/18 2042  WBC 4.1 6.6 5.8 6.2  --   --   HGB 8.8* 8.6* 8.2* 5.6* 6.0* 9.5*  HCT 28.6* 28.7* 27.6* 18.8* 20.1* 31.0*  MCV 96.6 96.6 98.9 100.5*  --   --   PLT 181 211 210 234  --   --    Basic Metabolic Panel: Recent Labs  Lab 01/09/18 0606 01/10/18 0508 01/11/18 0428 01/12/18 0445 01/13/18 0829  NA 154* 152* 148* 144 146*  K 3.4* 3.6 3.9 4.4 4.6  CL 114* 114* 114* 113* 113*  CO2 28 25 24 24 24   GLUCOSE 113* 123* 116* 107* 116*  BUN 25* 43* 37* 38* 51*  CREATININE 1.21 1.16 1.12 1.05 1.07  CALCIUM 9.2 9.1 8.6* 8.5* 8.4*  MG  --   --   --  2.3  --    GFR: Estimated Creatinine Clearance: 58.4 mL/min (by C-G formula based on SCr of 1.07 mg/dL). Liver Function Tests: Recent Labs  Lab 01/13/18 0829  AST 22  ALT 20  ALKPHOS 46  BILITOT 0.4  PROT 5.1*  ALBUMIN 3.2*   No results for input(s): LIPASE, AMYLASE in the last 168 hours. No results for input(s): AMMONIA in the last 168 hours. Coagulation Profile: No results for input(s): INR, PROTIME in the last 168 hours. Cardiac Enzymes: No results for input(s): CKTOTAL, CKMB, CKMBINDEX, TROPONINI in the last 168 hours. BNP (last 3 results) No results for input(s): PROBNP in the last 8760 hours. HbA1C: No results for input(s): HGBA1C in the last 72 hours. CBG: Recent Labs  Lab 01/14/18 2009 01/15/18 0011 01/15/18 0424 01/15/18 0721 01/15/18 1116  GLUCAP 110* 112* 112* 119* 97   Lipid Profile: No results for input(s): CHOL, HDL, LDLCALC, TRIG, CHOLHDL, LDLDIRECT in the last 72 hours. Thyroid Function Tests: No results for input(s): TSH, T4TOTAL, FREET4, T3FREE, THYROIDAB in the last 72 hours. Anemia Panel: No results for input(s): VITAMINB12, FOLATE, FERRITIN, TIBC, IRON, RETICCTPCT in the last 72 hours. Sepsis Labs: No results for input(s): PROCALCITON, LATICACIDVEN in the last 168 hours.  No results found for this or any previous visit (from the past 240 hour(s)).        Radiology Studies: No results found.      Scheduled Meds: . carvedilol  6.25 mg Per Tube BID WC  . chlorhexidine  15 mL Mouth Rinse BID  . Chlorhexidine Gluconate Cloth  6 each Topical Daily  . feeding supplement (ENSURE ENLIVE)  237 mL Oral BID BM  . free water  250 mL Per Tube Q6H  . mouth rinse  15 mL Mouth Rinse q12n4p  . pantoprazole  40 mg Oral BID  . potassium chloride  40 mEq Per Tube Daily   Continuous Infusions: . sodium chloride    . feeding supplement (OSMOLITE 1.2 CAL) 1,000 mL (01/15/18 0210)     LOS: 16 days    Time spent: More than 50% of that time was spent in counseling and/or coordination of care.      Shelly Coss, MD [facility] Pager [phone]  If 7PM-7AM, please contact night-coverage [email or web address] Password [email or web address] 01/15/2018, 1:40 PM

## 2018-01-15 NOTE — Care Management Important Message (Signed)
Important Message  Patient Details  Name: [name]. MRN: [id number] Date of Birth: 01-26-1933   Medicare Important Message Given:  Yes    Kerin Salen 01/15/2018, 10:18 AMImportant Message  Patient Details  Name: [name]. MRN: [id number] Date of Birth: 1933/08/22   Medicare Important Message Given:  Yes    Kerin Salen 01/15/2018, 10:18 AM

## 2018-01-15 NOTE — Progress Notes (Signed)
Pt refusing blood draws from lab, PT says he "has been stuck enough".

## 2018-01-16 LAB — CBC
HEMATOCRIT: 21.5 % — AB (ref 39.0–52.0)
HEMOGLOBIN: 6.8 g/dL — AB (ref 13.0–17.0)
MCH: 32.4 pg (ref 26.0–34.0)
MCHC: 31.6 g/dL (ref 30.0–36.0)
MCV: 102.4 fL — ABNORMAL HIGH (ref 78.0–100.0)
Platelets: 267 10*3/uL (ref 150–400)
RBC: 2.1 MIL/uL — AB (ref 4.22–5.81)
RDW: 20.3 % — ABNORMAL HIGH (ref 11.5–15.5)
WBC: 4.8 10*3/uL (ref 4.0–10.5)

## 2018-01-16 LAB — GLUCOSE, CAPILLARY
GLUCOSE-CAPILLARY: 103 mg/dL — AB (ref 65–99)
GLUCOSE-CAPILLARY: 110 mg/dL — AB (ref 65–99)
GLUCOSE-CAPILLARY: 119 mg/dL — AB (ref 65–99)
Glucose-Capillary: 126 mg/dL — ABNORMAL HIGH (ref 65–99)
Glucose-Capillary: 105 mg/dL — ABNORMAL HIGH (ref 65–99)
Glucose-Capillary: 92 mg/dL (ref 65–99)
Glucose-Capillary: 118 mg/dL — ABNORMAL HIGH (ref 65–99)

## 2018-01-16 LAB — BASIC METABOLIC PANEL
ANION GAP: 8 (ref 5–15)
BUN: 24 mg/dL — ABNORMAL HIGH (ref 6–20)
CHLORIDE: 109 mmol/L (ref 101–111)
CO2: 25 mmol/L (ref 22–32)
CREATININE: 1.01 mg/dL (ref 0.61–1.24)
Calcium: 8.4 mg/dL — ABNORMAL LOW (ref 8.9–10.3)
GFR calc non Af Amer: >60 mL/min (ref >60)
Glucose, Bld: 112 mg/dL — ABNORMAL HIGH (ref 65–99)
Potassium: 4 mmol/L (ref 3.5–5.1)
SODIUM: 142 mmol/L (ref 135–145)

## 2018-01-16 LAB — PREPARE RBC (CROSSMATCH)

## 2018-01-16 MED ORDER — OSMOLITE 1.2 CAL PO LIQD
1000.0000 mL | ORAL | Status: DC
  Administered 2018-01-16 – 2018-01-18 (×3): 1000 mL
  Filled 2018-01-16 (×5): qty 1000

## 2018-01-16 MED ORDER — SODIUM CHLORIDE 0.9 % IV SOLN
Freq: Once | INTRAVENOUS | Status: AC
Start: 2018-01-16 — End: 2018-01-16
  Administered 2018-01-16: 07:00:00 via INTRAVENOUS

## 2018-01-16 NOTE — Progress Notes (Signed)
Patient ID: Jacob Bowens., male   DOB: [birth year], 84 y.o.   MRN: [id number] 17 Days Post-Op   Subjective: "Not doing good" this morning.  Has trouble being more specific.  Does not like blood draws and having his IV changed.  Had nausea yesterday and still some nausea this morning.  Vomited once yesterday.  Had a bowel movement yesterday reported not as black as previous per nursing.  Not able to get much in by mouth.  Denies pain.  Objective: Vital signs in last 24 hours: Temp:  [98.2 F (36.8 C)-98.9 F (37.2 C)] 98.6 F (37 C) (03/22 0646) Pulse Rate:  [76-90] 85 (03/22 0646) Resp:  [16-18] 16 (03/22 0646) BP: (118-126)/(44-61) 122/61 (03/22 0646) SpO2:  [98 %-99 %] 99 % (03/22 0646) Weight:  [82.8 kg (182 lb 8.7 oz)] 82.8 kg (182 lb 8.7 oz) (03/22 0427) Last BM Date: 01/15/18  Intake/Output from previous day: 03/21 0701 - 03/22 0700 In: 1470 [P.O.:490; NG/GT:730] Out: 825 [Urine:825] Intake/Output this shift: Total I/O In: -  Out: 325 [Urine:325]  General appearance: alert, cooperative, fatigued and Mildly confused, knows he is in the hospital but does not know which one. Resp: clear to auscultation bilaterally GI: Soft nontender and nondistended Incision/Wound: No erythema or drainage.  J-tube site clean.  Lab Results:  Recent Labs    01/13/18 0829  01/13/18 2042 01/16/18 0428  WBC 6.2  --   --  4.8  HGB 5.6*   < > 9.5* 6.8*  HCT 18.8*   < > 31.0* 21.5*  PLT 234  --   --  267   < > = values in this interval not displayed.   BMET Recent Labs    01/13/18 0829 01/16/18 0428  NA 146* 142  K 4.6 4.0  CL 113* 109  CO2 24 25  GLUCOSE 116* 112*  BUN 51* 24*  CREATININE 1.07 1.01  CALCIUM 8.4* 8.4*     Studies/Results: No results found.  Anti-infectives: Anti-infectives (From admission, onward)   Start     Dose/Rate Route Frequency Ordered Stop   01/05/18 1000  vancomycin (VANCOCIN) IVPB 1000 mg/200 mL premix  Status:  Discontinued     1,000  mg 200 mL/hr over 60 Minutes Intravenous Every 24 hours 01/04/18 0956 01/05/18 1319   01/04/18 1030  vancomycin (VANCOCIN) 1,500 mg in sodium chloride 0.9 % 500 mL IVPB     1,500 mg 250 mL/hr over 120 Minutes Intravenous  Once 01/04/18 0956 01/04/18 1314   01/03/18 0200  piperacillin-tazobactam (ZOSYN) IVPB 3.375 g  Status:  Discontinued     3.375 g 12.5 mL/hr over 240 Minutes Intravenous Every 8 hours 01/03/18 0112 01/11/18 1002   12/30/17 1714  ceFAZolin (ANCEF) 2-4 GM/100ML-% IVPB    Note to Pharmacy:  Otelia Sergeant   : cabinet override      12/30/17 1714 12/31/17 0529   12/30/17 1600  ceFAZolin (ANCEF) IVPB 2g/100 mL premix     2 g 200 mL/hr over 30 Minutes Intravenous Every 8 hours 12/30/17 1356 12/30/17 1747   12/30/17 0545  ceFAZolin (ANCEF) 2-4 GM/100ML-% IVPB    Note to Pharmacy:  Waldron Session   : cabinet override      12/30/17 0545 12/30/17 0756   12/30/17 0542  ceFAZolin (ANCEF) IVPB 2g/100 mL premix     2 g 200 mL/hr over 30 Minutes Intravenous On call to O.R. 12/30/17 0542 12/30/17 0826      Assessment/Plan: s/p Procedure(s): DIAGNOSTIC LAPAROSCOPY WITH  OPEN DISTAL GASTRECTOMY AND PLACEMENT JEJUNOSTOMY FEEDING TUBE Postop pneumonia and VDR F, resolved Poor gastric emptying preventing p.o. intake, on tube feedings.  Still with nausea.  I will increase tube feeding to 70 cc/h toward goal of 80 cc/h. GI bleed, likely oozing from staple line.  Anticoagulation held.  Hemoglobin has drifted back down again but not as severely and melena apparently improved.  2 units PRBCs have been ordered this morning. Continue PT   LOS: 17 days    Edward Jolly 01/16/2018

## 2018-01-16 NOTE — Progress Notes (Signed)
PROGRESS NOTE    [name].  DGL:[id number] DOB: [birth year] DOA: 12/30/2017 PCP: Eulas Post, MD   Brief Narrative: Patient is a 84 y.o. male who underwent diagnostic laparoscopy, open distal gastrectomy with Billroth II anastomosis and placement of a jejunostomy tube on 3/5 due to adenocarcinoma of the stomach-subsequent hospital course complicated by confusion, and subsequently developed hypotension and respiratory failure due to right lower lobe infiltrate/collapse due to mucous plugging requiring intubation.  Patient was provided supportive care, and subsequently improved and transferred back to the medical floor.  Hospitalist service was consulted on 3/14 for assistant and ongoing intermittent confusion.    Assessment & Plan:   Active Problems:   Gastric cancer ([place])   Acute respiratory failure ([place])   HCAP (healthcare-associated pneumonia)  Acute metabolic encephalopathy/delirium: Resolved.  Patient is alert and oriented during my evaluation this morning.  CT head unremarkable.  Continue supportive care.  Minimize narcotics/benzodiazepines.  Exposure to sunlight.encourage sitting up in chair and monitor.  He certainly remains at risk for delirium due to his age in hospital setting  Hypernatremia: Resolved.  Combined chronic systolic/diastolic heart failure:  Diuretics on hold  Aspiration pneumonia: Completed antibiotics course.  Respiratory status stable.  Gastric cancer status post diagnostic laparoscopy with open distal gastrectomy with Billroth II-placement of a jejunostomy tube: Postop issues-deferred to the primary service.  Perioperative blood loss related anemia: H and H dropped today to 6.8.Being transfused with 2 units of PRBC today.  Status post 2 units of PRBC transfusion on 01/13/18.  No report of any bloody bowel movement  Medicine will sign off.  Please recall if needed.  DVT prophylaxis:SCD Code Status: Full Family Communication: None present at  the bedside Disposition Plan: As per surgery   Procedures:iagnostic laparoscopy with open distal gastrectomy with Billroth II-placement of a jejunostomy tube  Antimicrobials:None  Subjective:  Patient seen and examined the bedside this morning.  He is being transfused 2 units of PRBC.Complains of nausea.  He did not report any bloody bowel movement.  Objective: Vitals:   01/16/18 1011 01/16/18 1020 01/16/18 1045 01/16/18 1255  BP: 133/60 130/62 128/60 135/61  Pulse: 84 80 74 79  Resp: 18 18 16 18   Temp: 98.4 F (36.9 C) 98.6 F (37 C) 98.6 F (37 C) 98.3 F (36.8 C)  TempSrc: Oral Oral Oral Oral  SpO2: 99% 98% 98% 100%  Weight:      Height:        Intake/Output Summary (Last 24 hours) at 01/16/2018 1336 Last data filed at 01/16/2018 1255 Gross per 24 hour  Intake 1388 ml  Output 325 ml  Net 1063 ml   Filed Weights   01/14/18 1438 01/15/18 0500 01/16/18 0427  Weight: 77.2 kg (170 lb 4 oz) 78.2 kg (172 lb 6.4 oz) 82.8 kg (182 lb 8.7 oz)    Examination:  General exam: Not in obvious distress,average built, generalized weakness HEENT:PERRL,Oral mucosa moist, Ear/Nose normal on gross exam Respiratory system: Bilateral equal air entry, normal vesicular breath sounds, no wheezes or crackles  Cardiovascular system: S1 & S2 heard, RRR. No JVD, murmurs, rubs, gallops or clicks. Gastrointestinal system: Abdomen is nondistended, soft and nontender. No organomegaly or masses felt. Normal bowel sounds heard. J tube, clean midline surgical wound Central nervous system: Alert and oriented. No focal neurological deficits. Extremities: No edema, no clubbing ,no cyanosis, distal peripheral pulses palpable. Skin: No rashes, lesions or ulcers,no icterus ,no pallor MSK: Normal muscle bulk,tone ,power Psychiatry: Judgement and insight appear  normal. Mood & affect appropriate.     Data Reviewed: I have personally reviewed following labs and imaging studies  CBC: Recent Labs  Lab  01/10/18 0508 01/11/18 0428 01/13/18 0829 01/13/18 1037 01/13/18 2042 01/16/18 0428  WBC 6.6 5.8 6.2  --   --  4.8  HGB 8.6* 8.2* 5.6* 6.0* 9.5* 6.8*  HCT 28.7* 27.6* 18.8* 20.1* 31.0* 21.5*  MCV 96.6 98.9 100.5*  --   --  102.4*  PLT 211 210 234  --   --  789   Basic Metabolic Panel: Recent Labs  Lab 01/10/18 0508 01/11/18 0428 01/12/18 0445 01/13/18 0829 01/16/18 0428  NA 152* 148* 144 146* 142  K 3.6 3.9 4.4 4.6 4.0  CL 114* 114* 113* 113* 109  CO2 25 24 24 24 25   GLUCOSE 123* 116* 107* 116* 112*  BUN 43* 37* 38* 51* 24*  CREATININE 1.16 1.12 1.05 1.07 1.01  CALCIUM 9.1 8.6* 8.5* 8.4* 8.4*  MG  --   --  2.3  --   --    GFR: Estimated Creatinine Clearance: 61.9 mL/min (by C-G formula based on SCr of 1.01 mg/dL). Liver Function Tests: Recent Labs  Lab 01/13/18 0829  AST 22  ALT 20  ALKPHOS 46  BILITOT 0.4  PROT 5.1*  ALBUMIN 3.2*   No results for input(s): LIPASE, AMYLASE in the last 168 hours. No results for input(s): AMMONIA in the last 168 hours. Coagulation Profile: No results for input(s): INR, PROTIME in the last 168 hours. Cardiac Enzymes: No results for input(s): CKTOTAL, CKMB, CKMBINDEX, TROPONINI in the last 168 hours. BNP (last 3 results) No results for input(s): PROBNP in the last 8760 hours. HbA1C: No results for input(s): HGBA1C in the last 72 hours. CBG: Recent Labs  Lab 01/15/18 2002 01/15/18 2317 01/16/18 0422 01/16/18 0743 01/16/18 1150  GLUCAP 126* 106* 103* 110* 105*   Lipid Profile: No results for input(s): CHOL, HDL, LDLCALC, TRIG, CHOLHDL, LDLDIRECT in the last 72 hours. Thyroid Function Tests: No results for input(s): TSH, T4TOTAL, FREET4, T3FREE, THYROIDAB in the last 72 hours. Anemia Panel: No results for input(s): VITAMINB12, FOLATE, FERRITIN, TIBC, IRON, RETICCTPCT in the last 72 hours. Sepsis Labs: No results for input(s): PROCALCITON, LATICACIDVEN in the last 168 hours.  No results found for this or any previous  visit (from the past 240 hour(s)).       Radiology Studies: No results found.      Scheduled Meds: . carvedilol  6.25 mg Per Tube BID WC  . chlorhexidine  15 mL Mouth Rinse BID  . Chlorhexidine Gluconate Cloth  6 each Topical Daily  . feeding supplement (ENSURE ENLIVE)  237 mL Oral BID BM  . free water  250 mL Per Tube Q6H  . mouth rinse  15 mL Mouth Rinse q12n4p  . pantoprazole  40 mg Oral BID  . potassium chloride  40 mEq Per Tube Daily   Continuous Infusions: . sodium chloride    . feeding supplement (OSMOLITE 1.2 CAL) 1,000 mL (01/16/18 0805)     LOS: 17 days    Time spent: More than 50% of that time was spent in counseling and/or coordination of care.      Shelly Coss, MD [facility] Pager [phone]  If 7PM-7AM, please contact night-coverage [email or web address] Password [facility] 01/16/2018, 1:36 PM

## 2018-01-16 NOTE — Progress Notes (Signed)
PT Cancellation Note  Patient Details Name: [name]. MRN: [id number] DOB: [birth year]   Cancelled Treatment:    Reason Eval/Treat Not Completed: Medical issues which prohibited therapy--low hgb. Pt to receive transfusions(s) today. Will hold PT.    Weston Anna, MPT Pager: [phone]

## 2018-01-16 NOTE — Progress Notes (Signed)
PT Cancellation Note  Patient Details Name: [name]. MRN: [id number] DOB: September 08, 1933   Cancelled Treatment:    Reason Eval/Treat Not Completed: Patient declined, no reason specified. Will check back another day.    Weston Anna, MPT Pager: [phone]

## 2018-01-16 NOTE — Progress Notes (Signed)
CRITICAL VALUE ALERT  Critical Value:  Hgb 6.8  Date & Time Notied:  01/16/18 0524  Provider Notified: Silas [place]  Orders Received/Actions taken: Waiting on orders

## 2018-01-17 LAB — TYPE AND SCREEN
ABO/RH(D): A NEG
Antibody Screen: NEGATIVE
UNIT DIVISION: 0
Unit division: 0
Unit division: 0
Unit division: 0

## 2018-01-17 LAB — BPAM RBC
BLOOD PRODUCT EXPIRATION DATE: 201904052359
BLOOD PRODUCT EXPIRATION DATE: 201904122359
Blood Product Expiration Date: 201904022359
Blood Product Expiration Date: 201904112359
ISSUE DATE / TIME: 201903221017
ISSUE DATE / TIME: 201903191135
ISSUE DATE / TIME: 201903191438
ISSUE DATE / TIME: 201903220652
UNIT TYPE AND RH: 600
UNIT TYPE AND RH: 600
Unit Type and Rh: 600
Unit Type and Rh: 600

## 2018-01-17 LAB — GLUCOSE, CAPILLARY
GLUCOSE-CAPILLARY: 106 mg/dL — AB (ref 65–99)
GLUCOSE-CAPILLARY: 124 mg/dL — AB (ref 65–99)
GLUCOSE-CAPILLARY: 97 mg/dL (ref 65–99)
GLUCOSE-CAPILLARY: 95 mg/dL (ref 65–99)
Glucose-Capillary: 99 mg/dL (ref 65–99)
Glucose-Capillary: 102 mg/dL — ABNORMAL HIGH (ref 65–99)

## 2018-01-17 NOTE — Progress Notes (Signed)
Pt is feeling nauseous and does not feel like he could tolerate an increase in his tube feeding. Will keep at 70/hr for now.

## 2018-01-17 NOTE — Progress Notes (Signed)
Patient ID: Jacob Bowens., male   DOB: [birth year], 84 y.o.   MRN: [id number] 18 Days Post-Op   Subjective: Doesn't feel well.  Knows he is confused.  Had nausea yesterday and still some nausea this morning.  Vomited once yesterday.  No melena  Not able to get much in by mouth.  Denies pain.  Objective: Vital signs in last 24 hours: Temp:  [98.1 F (36.7 C)-98.6 F (37 C)] 98.5 F (36.9 C) (03/23 0534) Pulse Rate:  [73-84] 76 (03/23 0534) Resp:  [16-19] 19 (03/23 0534) BP: (124-137)/(48-71) 137/71 (03/23 0534) SpO2:  [98 %-100 %] 100 % (03/23 0534) Weight:  [81 kg (178 lb 9.2 oz)] 81 kg (178 lb 9.2 oz) (03/23 0534) Last BM Date: 01/16/18  Intake/Output from previous day: 03/22 0701 - 03/23 0700 In: 3307.2 [P.O.:30; I.V.:30; Blood:628; NG/GT:2619.2] Out: 300 [Urine:300] Intake/Output this shift: No intake/output data recorded.  General appearance: alert, cooperative, fatigued and Mildly confused, knows he is in the hospital but does not know which one. Resp: clear to auscultation bilaterally GI: Soft nontender and nondistended Incision/Wound: No erythema or drainage.  J-tube site clean.  Lab Results:  Recent Labs    01/16/18 0428  WBC 4.8  HGB 6.8*  HCT 21.5*  PLT 267   BMET Recent Labs    01/16/18 0428  NA 142  K 4.0  CL 109  CO2 25  GLUCOSE 112*  BUN 24*  CREATININE 1.01  CALCIUM 8.4*     Studies/Results: No results found.  Anti-infectives: Anti-infectives (From admission, onward)   Start     Dose/Rate Route Frequency Ordered Stop   01/05/18 1000  vancomycin (VANCOCIN) IVPB 1000 mg/200 mL premix  Status:  Discontinued     1,000 mg 200 mL/hr over 60 Minutes Intravenous Every 24 hours 01/04/18 0956 01/05/18 1319   01/04/18 1030  vancomycin (VANCOCIN) 1,500 mg in sodium chloride 0.9 % 500 mL IVPB     1,500 mg 250 mL/hr over 120 Minutes Intravenous  Once 01/04/18 0956 01/04/18 1314   01/03/18 0200  piperacillin-tazobactam (ZOSYN) IVPB 3.375 g   Status:  Discontinued     3.375 g 12.5 mL/hr over 240 Minutes Intravenous Every 8 hours 01/03/18 0112 01/11/18 1002   12/30/17 1714  ceFAZolin (ANCEF) 2-4 GM/100ML-% IVPB    Note to Pharmacy:  Otelia Sergeant   : cabinet override      12/30/17 1714 12/31/17 0529   12/30/17 1600  ceFAZolin (ANCEF) IVPB 2g/100 mL premix     2 g 200 mL/hr over 30 Minutes Intravenous Every 8 hours 12/30/17 1356 12/30/17 1747   12/30/17 0545  ceFAZolin (ANCEF) 2-4 GM/100ML-% IVPB    Note to Pharmacy:  Waldron Session   : cabinet override      12/30/17 0545 12/30/17 0756   12/30/17 0542  ceFAZolin (ANCEF) IVPB 2g/100 mL premix     2 g 200 mL/hr over 30 Minutes Intravenous On call to O.R. 12/30/17 0542 12/30/17 0826      Assessment/Plan: s/p Procedure(s): DIAGNOSTIC LAPAROSCOPY WITH OPEN DISTAL GASTRECTOMY AND PLACEMENT JEJUNOSTOMY FEEDING TUBE Postop pneumonia and VDR F, resolved Poor gastric emptying preventing p.o. intake, on tube feedings.  Still with nausea.  I will increase tube feeding  toward goal of 85 cc/h.  Back off to CL only for comfort. GI bleed, likely oozing from staple line.  Anticoagulation held.  Hemoglobin has drifted back down again yesterday but not as severely and melena apparently improved.  2 units PRBCs given yesterday,  repeat lab pending. Continue PT   LOS: 18 days    [name] 3/23/2019Patient ID: Jacob Bowens., male   DOB: 14-Jul-1933, 84 y.o.   MRN: [id number]

## 2018-01-18 LAB — BASIC METABOLIC PANEL
Anion gap: 8 (ref 5–15)
BUN: 22 mg/dL — AB (ref 6–20)
CALCIUM: 8.3 mg/dL — AB (ref 8.9–10.3)
CHLORIDE: 104 mmol/L (ref 101–111)
CO2: 26 mmol/L (ref 22–32)
CREATININE: 0.9 mg/dL (ref 0.61–1.24)
GFR calc Af Amer: >60 mL/min (ref >60)
GFR calc non Af Amer: >60 mL/min (ref >60)
GLUCOSE: 117 mg/dL — AB (ref 65–99)
Potassium: 4.3 mmol/L (ref 3.5–5.1)
Sodium: 138 mmol/L (ref 135–145)

## 2018-01-18 LAB — CBC
HEMATOCRIT: 31.3 % — AB (ref 39.0–52.0)
HEMOGLOBIN: 9.8 g/dL — AB (ref 13.0–17.0)
MCH: 31.5 pg (ref 26.0–34.0)
MCHC: 31.3 g/dL (ref 30.0–36.0)
MCV: 100.6 fL — AB (ref 78.0–100.0)
Platelets: 309 10*3/uL (ref 150–400)
RBC: 3.11 MIL/uL — ABNORMAL LOW (ref 4.22–5.81)
RDW: 20.6 % — AB (ref 11.5–15.5)
WBC: 5 10*3/uL (ref 4.0–10.5)

## 2018-01-18 LAB — GLUCOSE, CAPILLARY
Glucose-Capillary: 124 mg/dL — ABNORMAL HIGH (ref 65–99)
Glucose-Capillary: 103 mg/dL — ABNORMAL HIGH (ref 65–99)
Glucose-Capillary: 99 mg/dL (ref 65–99)
Glucose-Capillary: 116 mg/dL — ABNORMAL HIGH (ref 65–99)
Glucose-Capillary: 99 mg/dL (ref 65–99)

## 2018-01-18 MED ORDER — OSMOLITE 1.2 CAL PO LIQD
1000.0000 mL | ORAL | Status: DC
  Administered 2018-01-19 – 2018-01-22 (×6): 1000 mL
  Filled 2018-01-18 (×11): qty 1000

## 2018-01-18 NOTE — Progress Notes (Signed)
Patient ID: [name]., male   DOB: 12-31-1932, 84 y.o.   MRN: [id number] 19 Days Post-Op   Subjective: No new complaints but was nauseated again last night and says he vomited once.  No nausea this morning.  Tube feeding is at goal.  He says overall he feels a little stronger.  Objective: Vital signs in last 24 hours: Temp:  [98.6 F (37 C)-99.2 F (37.3 C)] 99.2 F (37.3 C) (03/24 0446) Pulse Rate:  [79-88] 88 (03/24 0446) Resp:  [16-18] 16 (03/24 0446) BP: (118)/(55-71) 118/61 (03/24 0446) SpO2:  [97 %-100 %] 97 % (03/24 0446) Weight:  [81.3 kg (179 lb 3.7 oz)] 81.3 kg (179 lb 3.7 oz) (03/24 0446) Last BM Date: 01/17/18  Intake/Output from previous day: 03/23 0701 - 03/24 0700 In: 1426.6 [P.O.:60; NG/GT:1366.6] Out: 400 [Urine:400] Intake/Output this shift: No intake/output data recorded.  General appearance: alert, cooperative, no distress and Overall appears brighter GI: normal findings: soft, non-tender and Nondistended.  J-tube site unremarkable.  Lab Results:  Recent Labs    01/16/18 0428  WBC 4.8  HGB 6.8*  HCT 21.5*  PLT 267   BMET Recent Labs    01/16/18 0428  NA 142  K 4.0  CL 109  CO2 25  GLUCOSE 112*  BUN 24*  CREATININE 1.01  CALCIUM 8.4*     Studies/Results: No results found.  Anti-infectives: Anti-infectives (From admission, onward)   Start     Dose/Rate Route Frequency Ordered Stop   01/05/18 1000  vancomycin (VANCOCIN) IVPB 1000 mg/200 mL premix  Status:  Discontinued     1,000 mg 200 mL/hr over 60 Minutes Intravenous Every 24 hours 01/04/18 0956 01/05/18 1319   01/04/18 1030  vancomycin (VANCOCIN) 1,500 mg in sodium chloride 0.9 % 500 mL IVPB     1,500 mg 250 mL/hr over 120 Minutes Intravenous  Once 01/04/18 0956 01/04/18 1314   01/03/18 0200  piperacillin-tazobactam (ZOSYN) IVPB 3.375 g  Status:  Discontinued     3.375 g 12.5 mL/hr over 240 Minutes Intravenous Every 8 hours 01/03/18 0112 01/11/18 1002   12/30/17 1714   ceFAZolin (ANCEF) 2-4 GM/100ML-% IVPB    Note to Pharmacy:  Otelia Sergeant   : cabinet override      12/30/17 1714 12/31/17 0529   12/30/17 1600  ceFAZolin (ANCEF) IVPB 2g/100 mL premix     2 g 200 mL/hr over 30 Minutes Intravenous Every 8 hours 12/30/17 1356 12/30/17 1747   12/30/17 0545  ceFAZolin (ANCEF) 2-4 GM/100ML-% IVPB    Note to Pharmacy:  Waldron Session   : cabinet override      12/30/17 0545 12/30/17 0756   12/30/17 0542  ceFAZolin (ANCEF) IVPB 2g/100 mL premix     2 g 200 mL/hr over 30 Minutes Intravenous On call to O.R. 12/30/17 0542 12/30/17 0826      Assessment/Plan: s/p Procedure(s): DIAGNOSTIC LAPAROSCOPY WITH OPEN DISTAL GASTRECTOMY AND PLACEMENT JEJUNOSTOMY FEEDING TUBE Postoperative pneumonia and VDR F, resolved Persistent nausea vomiting-poor gastric emptying.  Patient on sips of clear liquids only.  Jejunal feeding at goal at 85 cc/h. Postop GI bleed.  Likely staple line bleed.  Anticoagulation held.  Received 2 units PRBCs 2 days ago.  Recheck CBC and be met today.   LOS: 19 days    Edward Jolly 01/18/2018

## 2018-01-19 ENCOUNTER — Other Ambulatory Visit: Payer: Self-pay | Admitting: General Surgery

## 2018-01-19 LAB — GLUCOSE, CAPILLARY
GLUCOSE-CAPILLARY: 111 mg/dL — AB (ref 65–99)
GLUCOSE-CAPILLARY: 115 mg/dL — AB (ref 65–99)
GLUCOSE-CAPILLARY: 99 mg/dL (ref 65–99)
Glucose-Capillary: 112 mg/dL — ABNORMAL HIGH (ref 65–99)
Glucose-Capillary: 102 mg/dL — ABNORMAL HIGH (ref 65–99)
Glucose-Capillary: 107 mg/dL — ABNORMAL HIGH (ref 65–99)

## 2018-01-19 MED ORDER — ONDANSETRON HCL 4 MG PO TABS
4.0000 mg | ORAL_TABLET | Freq: Three times a day (TID) | ORAL | Status: DC
  Administered 2018-01-19 – 2018-01-22 (×11): 4 mg via ORAL
  Filled 2018-01-19 (×11): qty 1

## 2018-01-19 MED ORDER — ONDANSETRON 4 MG PO TBDP
4.0000 mg | ORAL_TABLET | Freq: Three times a day (TID) | ORAL | Status: DC | PRN
  Administered 2018-01-20: 4 mg via ORAL
  Filled 2018-01-19: qty 1

## 2018-01-19 MED ORDER — ALUM & MAG HYDROXIDE-SIMETH 200-200-20 MG/5ML PO SUSP
15.0000 mL | Freq: Once | ORAL | Status: AC
  Administered 2018-01-19: 15 mL via ORAL
  Filled 2018-01-19: qty 30

## 2018-01-19 MED ORDER — SUCRALFATE 1 GM/10ML PO SUSP
1.0000 g | Freq: Three times a day (TID) | ORAL | Status: DC
  Administered 2018-01-20 – 2018-01-22 (×9): 1 g via ORAL
  Filled 2018-01-19 (×11): qty 10

## 2018-01-19 MED ORDER — ONDANSETRON HCL 4 MG/2ML IJ SOLN
4.0000 mg | INTRAMUSCULAR | Status: DC | PRN
  Administered 2018-01-22: 4 mg via INTRAVENOUS
  Filled 2018-01-19: qty 2

## 2018-01-19 NOTE — Care Management Important Message (Signed)
Important Message  Patient Details  Name: [name]. MRN: [id number] Date of Birth: 1933-01-29   Medicare Important Message Given:  Yes    Kerin Salen 01/19/2018, 10:53 AMImportant Message  Patient Details  Name: [name]. MRN: [id number] Date of Birth: [birth year]   Medicare Important Message Given:  Yes    Kerin Salen 01/19/2018, 10:53 AM

## 2018-01-19 NOTE — Progress Notes (Signed)
Pt complaining of chest pain. BP 117/55, pulse 78. MD notified. Order for Mylanta given. If not better after medication, Dr Excell Seltzer wants pt to receive an EKG and troponin lab

## 2018-01-19 NOTE — Progress Notes (Signed)
Patient ID: [name]., male   DOB: September 19, 1933, 84 y.o.   MRN: [id number] 20 Days Post-Op   Subjective: C/o heartburn and pain at J tube site.    Objective: Vital signs in last 24 hours: Temp:  [97.9 F (36.6 C)-98.8 F (37.1 C)] 98.8 F (37.1 C) (03/25 1353) Pulse Rate:  [74-88] 74 (03/25 1353) Resp:  [16-17] 16 (03/25 1353) BP: (93-130)/(44-70) 93/44 (03/25 1353) SpO2:  [99 %-100 %] 100 % (03/25 1353) Weight:  [81.6 kg (179 lb 14.3 oz)] 81.6 kg (179 lb 14.3 oz) (03/25 0915) Last BM Date: 01/17/18  Intake/Output from previous day: 03/24 0701 - 03/25 0700 In: 964 [P.O.:320; NG/GT:644] Out: 550 [Urine:550] Intake/Output this shift: Total I/O In: -  Out: 30 [Emesis/NG output:30]  General appearance: alert, cooperative, no distress and Overall appears brighter GI: normal findings: soft, non-tender and Nondistended.  J-tube site unremarkable. Tube is not taped up.    Lab Results:  Recent Labs    01/18/18 0910  WBC 5.0  HGB 9.8*  HCT 31.3*  PLT 309   BMET Recent Labs    01/18/18 0910  NA 138  K 4.3  CL 104  CO2 26  GLUCOSE 117*  BUN 22*  CREATININE 0.90  CALCIUM 8.3*     Studies/Results: No results found.  Anti-infectives: Anti-infectives (From admission, onward)   Start     Dose/Rate Route Frequency Ordered Stop   01/05/18 1000  vancomycin (VANCOCIN) IVPB 1000 mg/200 mL premix  Status:  Discontinued     1,000 mg 200 mL/hr over 60 Minutes Intravenous Every 24 hours 01/04/18 0956 01/05/18 1319   01/04/18 1030  vancomycin (VANCOCIN) 1,500 mg in sodium chloride 0.9 % 500 mL IVPB     1,500 mg 250 mL/hr over 120 Minutes Intravenous  Once 01/04/18 0956 01/04/18 1314   01/03/18 0200  piperacillin-tazobactam (ZOSYN) IVPB 3.375 g  Status:  Discontinued     3.375 g 12.5 mL/hr over 240 Minutes Intravenous Every 8 hours 01/03/18 0112 01/11/18 1002   12/30/17 1714  ceFAZolin (ANCEF) 2-4 GM/100ML-% IVPB    Note to Pharmacy:  Otelia Sergeant   : cabinet  override      12/30/17 1714 12/31/17 0529   12/30/17 1600  ceFAZolin (ANCEF) IVPB 2g/100 mL premix     2 g 200 mL/hr over 30 Minutes Intravenous Every 8 hours 12/30/17 1356 12/30/17 1747   12/30/17 0545  ceFAZolin (ANCEF) 2-4 GM/100ML-% IVPB    Note to Pharmacy:  Waldron Session   : cabinet override      12/30/17 0545 12/30/17 0756   12/30/17 0542  ceFAZolin (ANCEF) IVPB 2g/100 mL premix     2 g 200 mL/hr over 30 Minutes Intravenous On call to O.R. 12/30/17 0542 12/30/17 0826      Assessment/Plan: s/p Procedure(s): DIAGNOSTIC LAPAROSCOPY WITH OPEN DISTAL GASTRECTOMY AND PLACEMENT JEJUNOSTOMY FEEDING TUBE Postoperative pneumonia and VDR F, resolved Goal feeds, delayed gastric emptying.  Bile reflux Add carafate and d/c maalox.   Postop GI bleed.  Likely staple line bleed.  Anticoagulation held.  Stable since transfusion.   Recheck labs tomorrow. D/w wife and daughter.  Tentative d/c plan Wednesday to SNF.     LOS: 20 days    Stark Klein 01/19/2018

## 2018-01-19 NOTE — Progress Notes (Signed)
Patient has bed at [facility]. Patient can discharge to SNF when medically ready.  Kathrin Greathouse, Latanya Presser, MSW Clinical Social Worker  [phone] 01/19/2018  1:42 PM

## 2018-01-19 NOTE — Progress Notes (Signed)
Nutrition Follow-up  INTERVENTION:   Continue Osmolite 1.2 @ 85 ml/hr via J-tube. Provides 2448 kcal, 113g protein and 1672 ml H2O.  NUTRITION DIAGNOSIS:   Increased nutrient needs related to catabolic illness, cancer and cancer related treatments, wound healing as evidenced by estimated needs.  Ongoing.  GOAL:   Patient will meet greater than or equal to 90% of their needs  Meeting with TF.  MONITOR:   PO intake, Supplement acceptance, Labs, Weight trends, Skin, I & O's, TF tolerance  ASSESSMENT:   84 year old male with gastric cancer who presented with an abnormal PET scan on follow-up for lymphoma (Stage IV, dx 08/2016).  He had a gastric mass seen on the PET scan and upper endoscopy confirmed gastric cancer in this location. The patient denies any issues in appetite.  He has not had any weight loss. His energy level has improved significantly since his treatment for lymphoma and is s/p chemo therapy for lymphoma. He has also seen radiation oncology. His bone marrow bx was negative for marrow involvement with lymphoma.   Pt continues to have occasional N/V. Diet is back to clear liquids.Tube feeding currently Osmolite 1.2 @ 85 ml/hr. Vomit has been green in color. Order placed by MD to not have TF stopped unless emesis is TF or stomach is distended.  Medications: KCl solution daily, Zofran -ODT PRN Labs reviewed: CBGs: 111-115  Diet Order:  Diet clear liquid Room service appropriate? Yes; Fluid consistency: Thin  EDUCATION NEEDS:   Not appropriate for education at this time  Skin:  Skin Assessment: Skin Integrity Issues: Skin Integrity Issues:: Incisions Incisions: abdominal (3/5)  Last BM:  3/23  Height:   Ht Readings from Last 1 Encounters:  12/30/17 6' (1.829 m)    Weight:   Wt Readings from Last 1 Encounters:  01/19/18 179 lb 14.3 oz (81.6 kg)    Ideal Body Weight:  80.91 kg  BMI:  Body mass index is 24.4 kg/m.  Estimated Nutritional Needs:    Kcal:  2330-2500 (28-30 kcal/kg)  Protein:  125-140 grams (1.5-1.7 grams/kg)  Fluid:  >/= 2.3 L/day  Clayton Bibles, MS, RD, LDN [place] Diet[facility] Pager: [phone] After Hours Pager: [phone]

## 2018-01-19 NOTE — Progress Notes (Signed)
Spoon fed patient more of the Mylanta; he did not like the taste but stated it did help his pain. Donne Hazel, RN

## 2018-01-19 NOTE — Progress Notes (Signed)
PT Cancellation Note  Patient Details Name: [name]. MRN: [id number] DOB: 01-Aug-1933   Cancelled Treatment:    Reason Eval/Treat Not Completed: Medical issues which prohibited therapy(pt is having nausea and vomiting, per RN he's had Zofran. Pt reports he ambulated in halls several times yesterday. Will follow. )   Philomena Doheny 01/19/2018, 10:02 AM [phone]

## 2018-01-20 LAB — GLUCOSE, CAPILLARY
GLUCOSE-CAPILLARY: 100 mg/dL — AB (ref 65–99)
GLUCOSE-CAPILLARY: 108 mg/dL — AB (ref 65–99)
GLUCOSE-CAPILLARY: 114 mg/dL — AB (ref 65–99)
Glucose-Capillary: 100 mg/dL — ABNORMAL HIGH (ref 65–99)
Glucose-Capillary: 100 mg/dL — ABNORMAL HIGH (ref 65–99)
Glucose-Capillary: 120 mg/dL — ABNORMAL HIGH (ref 65–99)
Glucose-Capillary: 97 mg/dL (ref 65–99)

## 2018-01-20 LAB — BASIC METABOLIC PANEL
ANION GAP: 8 (ref 5–15)
BUN: 23 mg/dL — ABNORMAL HIGH (ref 6–20)
CALCIUM: 8.5 mg/dL — AB (ref 8.9–10.3)
CO2: 24 mmol/L (ref 22–32)
Chloride: 108 mmol/L (ref 101–111)
Creatinine, Ser: 0.86 mg/dL (ref 0.61–1.24)
GFR calc Af Amer: >60 mL/min (ref >60)
Glucose, Bld: 109 mg/dL — ABNORMAL HIGH (ref 65–99)
POTASSIUM: 4.6 mmol/L (ref 3.5–5.1)
SODIUM: 140 mmol/L (ref 135–145)

## 2018-01-20 LAB — CBC
HCT: 33.3 % — ABNORMAL LOW (ref 39.0–52.0)
Hemoglobin: 10.4 g/dL — ABNORMAL LOW (ref 13.0–17.0)
MCH: 31 pg (ref 26.0–34.0)
MCHC: 31.2 g/dL (ref 30.0–36.0)
MCV: 99.1 fL (ref 78.0–100.0)
PLATELETS: 308 10*3/uL (ref 150–400)
RBC: 3.36 MIL/uL — AB (ref 4.22–5.81)
RDW: 19.7 % — AB (ref 11.5–15.5)
WBC: 5.5 10*3/uL (ref 4.0–10.5)

## 2018-01-20 MED ORDER — LIP MEDEX EX OINT
TOPICAL_OINTMENT | CUTANEOUS | Status: AC
  Administered 2018-01-20: 17:00:00
  Filled 2018-01-20: qty 7

## 2018-01-20 MED ORDER — ONDANSETRON 4 MG PO TBDP: 4.0000 mg | ORAL_TABLET | Freq: Three times a day (TID) | ORAL | 2 refills | Status: DC | PRN

## 2018-01-20 MED ORDER — SUCRALFATE 1 GM/10ML PO SUSP: 1.0000 g | Freq: Three times a day (TID) | ORAL | 0 refills | Status: DC

## 2018-01-20 MED ORDER — OSMOLITE 1.2 CAL PO LIQD
1000.0000 mL | ORAL | 3 refills | Status: DC
Start: 2018-01-20 — End: 2018-01-23

## 2018-01-20 MED ORDER — POTASSIUM CHLORIDE 20 MEQ/15ML (10%) PO SOLN: 40.0000 meq | Freq: Every day | ORAL | 0 refills | Status: DC

## 2018-01-20 MED ORDER — ENSURE ENLIVE PO LIQD: 237.0000 mL | Freq: Two times a day (BID) | ORAL | 12 refills | Status: DC

## 2018-01-20 NOTE — Discharge Instructions (Signed)
Care of a Feeding Tube People who have trouble swallowing or cannot take food or medicine by mouth are sometimes given feeding tubes. A feeding tube can go into the nose and down to the stomach or through the skin in the abdomen and into the stomach or small bowel. Some of the names of these feeding tubes are gastrostomy tubes, PEG lines, nasogastric tubes, and gastrojejunostomy tubes. Supplies needed to care for the tube site:  Clean gloves.  Clean wash cloth, gauze pads, or soft paper towel.  Cotton swabs.  Skin barrier ointment or cream.  Soap and water.  Pre-cut foam pads or gauze (that go around the tube).  Tube tape. Tube site care 1. Have all supplies ready and available. 2. Wash hands well. 3. Put on clean gloves. 4. Remove the soiled foam pad or gauze, if present, that is found under the tube stabilizer. Change the foam pad or gauze daily or when soiled or moist. 5. Check the skin around the tube site for redness, rash, swelling, drainage, or extra tissue growth. If you notice any of these, call your caregiver. 6. Moisten gauze and cotton swabs with water and soap. 7. Wipe the area closest to the tube (right near the stoma) with cotton swabs. Wipe the surrounding skin with moistened gauze. Rinse with water. 8. Dry the skin and stoma site with a dry gauze pad or soft paper towel. Do not use antibiotic ointments at the tube site. 9. If the skin is red, apply a skin barrier cream or ointment (such as petroleum jelly) in a circular motion, using a cotton swab. The cream or ointment will provide a moisture barrier for the skin and helps with wound healing. 10. Apply a new pre-cut foam pad or gauze around the tube. Secure it with tape around the edges. If no drainage is present, foam pads or gauze may be left off. 11. Use tape or an anchoring device to fasten the feeding tube to the skin for comfort or as directed. Rotate where you tape the tube to avoid skin damage from the  adhesive. 12. Position the person in a semi-upright position (30-45 degree angle). 13. Throw away used supplies. 14. Remove gloves. 15. Wash hands. Supplies needed to flush a feeding tube:  Clean gloves.  60 mL syringe (that connects to the feeding tube).  Towel.  Water. Flushing a feeding tube 1. Have all supplies ready and available. 2. Wash hands well. 3. Put on clean gloves. 4. Draw up 30 mL of water in the syringe. 5. Kink the feeding tube while disconnecting it from the feeding-bag tubing or while removing the plug at the end of the tube. Kinking closes the tube and prevents secretions in the tube from spilling out. 6. Insert the tip of the syringe into the end of the feeding tube. Release the kink. Slowly inject the water. 7. If unable to inject the water, the person with the feeding tube should lay on his or her left side. The tip of the tube may be against the stomach wall, blocking fluid flow. Changing positions may move the tip away from the stomach wall. After repositioning, try injecting the water again. ? Do not use a syringe smaller than 60 mL to flush the tubing. ? Do not use excessive force to overcome resistance because this could cause the tube to rupture. 8. After injecting the water, remove the syringe. 9. Always flush before giving the first medicine, between medicines, and after the final medicine before starting  a feeding. This prevents medicines from clogging the tube. ? Do not mix medicines with formula or with other medicines before giving medicines. ? Thoroughly flush medicines through the tube so they do not mix with formula. 10. Throw away used supplies. 11. Remove gloves. 12. Wash hands. This information is not intended to replace advice given to you by your health care provider. Make sure you discuss any questions you have with your health care provider. Document Released: 10/14/2005 Document Revised: 03/27/2016 Document Reviewed: 05/28/2012 Elsevier  Interactive Patient Education  2017 [facility], [place] [phone]  ABDOMINAL SURGERY: POST OP INSTRUCTIONS  Always review your discharge instruction sheet given to you by the facility where your surgery was performed.  IF YOU HAVE DISABILITY OR FAMILY LEAVE FORMS, YOU MUST BRING THEM TO THE OFFICE FOR PROCESSING.  PLEASE DO NOT GIVE THEM TO YOUR DOCTOR.  1. A prescription for pain medication may be given to you upon discharge.  Take your pain medication as prescribed, if needed.  If narcotic pain medicine is not needed, then you may take acetaminophen (Tylenol) or ibuprofen (Advil) as needed. 2. Take your usually prescribed medications unless otherwise directed. 3. If you need a refill on your pain medication, please contact your pharmacy. They will contact our office to request authorization.  Prescriptions will not be filled after 5pm or on week-ends. 4. You should follow a light diet the first few days after arrival home, such as soup and crackers, pudding, etc.unless your doctor has advised otherwise. A high-fiber, low fat diet can be resumed as tolerated.   Be sure to include lots of fluids daily. Most patients will experience some swelling and bruising on the chest and neck area.  Ice packs will help.  Swelling and bruising can take several days to resolve 5. Most patients will experience some swelling and bruising in the area of the incision. Ice pack will help. Swelling and bruising can take several days to resolve..  6. It is common to experience some constipation if taking pain medication after surgery.  Increasing fluid intake and taking a stool softener will usually help or prevent this problem from occurring.  A mild laxative (Milk of Magnesia or Miralax) should be taken according to package directions if there are no bowel movements after 48 hours. 7.  You may have steri-strips (small skin tapes) in place directly over the incision.  These strips  should be left on the skin for 10-14 days.  If your surgeon used skin glue on the incision, you may shower in 48 hours.  The glue will flake off over the next 2-3 weeks.  Any sutures or staples will be removed at the office during your follow-up visit. You may find that a light gauze bandage over your incision may keep your staples from being rubbed or pulled. You may shower and replace the bandage daily. 8. ACTIVITIES:  You may resume regular (light) daily activities beginning the next day--such as daily self-care, walking, climbing stairs--gradually increasing activities as tolerated.  You may have sexual intercourse when it is comfortable.  Refrain from any heavy lifting or straining until approved by your doctor. a. You may drive when you no longer are taking prescription pain medication, you can comfortably wear a seatbelt, and you can safely maneuver your car and apply brakes b. Return to Work: __________8 weeks if applicable_________________________ 12. You should see your doctor in the office for a follow-up appointment approximately two  weeks after your surgery.  Make sure that you call for this appointment within a day or two after you arrive home to insure a convenient appointment time. OTHER INSTRUCTIONS:  _____________________________________________________________ _____________________________________________________________  WHEN TO CALL YOUR DOCTOR: 1. Fever over 101.0 2. Inability to urinate 3. Nausea and/or vomiting 4. Extreme swelling or bruising 5. Continued bleeding from incision. 6. Increased pain, redness, or drainage from the incision. 7. Difficulty swallowing or breathing 8. Muscle cramping or spasms. 9. Numbness or tingling in hands or feet or around lips.  The clinic staff is available to answer your questions during regular business hours.  Please dont hesitate to call and ask to speak to one of the nurses if you have concerns.  For further questions, please visit  [email or web address]

## 2018-01-20 NOTE — Discharge Summary (Signed)
Physician Discharge Summary  Patient ID: [name]. MRN: [id number] DOB/AGE: 1933/08/30 84 y.o.  Admit date: 12/30/2017 Discharge date: 01/22/2018  Admission Diagnoses:  Discharge Diagnoses:  Active Problems:   Gastric cancer (HCC)   Acute respiratory failure (HCC)   HCAP (healthcare-associated pneumonia)   Nausea and vomiting   Staple line bleed (GI bleed, resolved) Esophagitis Retching.   Severe protein calorie malnutrition Dependence on enteral feeds HTN Gout PMR CAD Non Hodgkin's lymphoma Sepsis from pneumonia  Discharged Condition: stable  Hospital Course: Pt was admitted to the stepdown unit following a dx laparoscopy and a distal gastrectomy with billroth II anastamosis.  He had confusion on POD 1 and narcotics were stopped.  He had an epidural with local anesthetic.  His confusion worsened and PCCM was consulted.  Over the next 12 hours, he worsened and became hypoxic .  He required intubation and pressors for presumed aspiration pneumonia based on CXR.  He was able to be extubated over the next 2 days and weaned of pressors over 3 days.  His tube feeds were started.  Due to periodic nausea and emesis, his tube feeds were periodically held despite no distention or emesis of the feeds.  This was eventually able to be advanced to goal.  He was unsurprisingly weak from his preexisting weakness and the episode of sepsis.   PT was consulted.  SNF was recommended. An upper GI was ordered which did not show any outflow obstruction or leakage.  The gastric remnant was not dilated.     He was tolerating clears, but developed heartburn and reflux.  He had a day or two of low grade fevers.  A CT was done to rule out any abscesses since his h/o lymphoma and neutropenia made his WBCs a bit unreliable as an indicator of infection.  This was negative for obstruction or leak.  He got to where anything to drink would "come right back up.  He declined maalox.  Carafate was started, but  he refused that too.  GI was consulted and diflucan/EGD recommended.  He had this prior to d/c which showed severe esophagitis, confirming clinical diagnosis.  In summary, he is weak and not quite ready for home discharge.  He needs continued therapy, full nutritional support via tube feeds, and encouragement.  He also appears to have situational depression and is quite down regarding the current events.  I suspect the occasional retching and spitting up will continue for another week or two.  He can have clears as tolerated and diet can be advanced every 2-3 days if tolerates.  As his esophagitis improves, his tolerance of PO should improve as well.    Consults: pulmonary/intensive care, GI and internal medicine  Significant Diagnostic Studies:  Labs prior to d/c. 01/20/18 BMET with Cr 0.86, o/w nl.  CBC HCt 33.3  EGD 01/22/18 Ulcerative esophagitis (LA Grade D), with what appears to be sites of regenerating/healing mucosa. Normal appearing gastro-jejunostomy s/p recent distal gastrectomy for gastric adenocarcinoma. Findings: - Severe acid, GERD releated damage to the esophagus. - No specimens collected.  CT abd/pelvis 01/21/18 IMPRESSION: 1. Postoperative changes compatible with distal gastrectomy and jejunostomy tube placement. No complicating features identified. 2. No fluid collections identified to suggest abscess. 3.  Aortic Atherosclerosis (ICD10-I70.0).  UGI 01/08/18 IMPRESSION: Postop partial gastrectomy with gastrojejunostomy. No leak or obstruction identified  Decreased esophageal peristalsis.  Mild gastroesophageal reflux.  Treatments: surgery: see above, tube feeds, intubation with bronchoscopy, EGD, tx for esophagitis.    Discharge  Exam: Blood pressure 109/64, pulse 72, temperature 98 F (36.7 C), temperature source Oral, resp. rate 16, height 6' (1.829 m), weight 78.5 kg (173 lb), SpO2 100 %. General appearance: alert, cooperative and mild distress Resp: breathing  comfortably Cardio: regular rate and rhythm GI: soft, non distended, J tube in place.  staples out, no erythema or drainage.   Extremities: extremities normal, atraumatic, no cyanosis or edema  Disposition: Discharge disposition: [place]   Discharge Instructions    Call MD for:  redness, tenderness, or signs of infection (pain, swelling, redness, odor or green/yellow discharge around incision site)   Complete by:  As directed    Call MD for:  severe uncontrolled pain   Complete by:  As directed    Call MD for:  temperature >100.4   Complete by:  As directed    Change dressing (specify)   Complete by:  As directed    J tube care   Increase activity slowly   Complete by:  As directed      Allergies as of 01/22/2018      Reactions   Niacin Hives      Medication List    STOP taking these medications   acetaminophen 500 MG tablet Commonly known as:  TYLENOL Replaced by:  acetaminophen 160 MG/5ML solution   potassium chloride SA 20 MEQ tablet Commonly known as:  K-DUR,KLOR-CON     TAKE these medications   acetaminophen 160 MG/5ML solution Commonly known as:  TYLENOL Place 20.3 mLs (650 mg total) into feeding tube every 4 (four) hours as needed for moderate pain. Replaces:  acetaminophen 500 MG tablet   b complex vitamins capsule Take 1 capsule by mouth daily.   carvedilol 6.25 MG tablet Commonly known as:  COREG Take 1 tablet (6.25 mg total) by mouth 2 (two) times daily.   feeding supplement (ENSURE ENLIVE) Liqd Take 237 mLs by mouth 2 (two) times daily between meals.   feeding supplement (OSMOLITE 1.2 CAL) Liqd Place 1,000 mLs into feeding tube continuous.   fluconazole 40 MG/ML suspension Commonly known as:  DIFLUCAN Place 2.5 mLs (100 mg total) into feeding tube daily. Start taking on:  01/23/2018   furosemide 40 MG tablet Commonly known as:  LASIX TAKE ONE TABLET BY MOUTH 1-2 TIMES PER WEEK What changed:    how much to take  how to  take this  when to take this  additional instructions   LORazepam 0.5 MG tablet Commonly known as:  ATIVAN Take one to two tablets one hour prior to procedure. What changed:    how much to take  how to take this  when to take this  additional instructions   NITROSTAT 0.4 MG SL tablet Generic drug:  nitroGLYCERIN DISSOLVE 1 TABLET UNDER THE TONGUE EVERY 5 MINUTES AS NEEDED What changed:  See the new instructions.   ondansetron 4 MG disintegrating tablet Commonly known as:  ZOFRAN-ODT Take 1 tablet (4 mg total) by mouth every 8 (eight) hours as needed for nausea or refractory nausea / vomiting.   pantoprazole 40 MG tablet Commonly known as:  PROTONIX Take 1 tablet (40 mg total) by mouth daily.   potassium chloride 20 MEQ/15ML (10%) Soln Place 30 mLs (40 mEq total) into feeding tube daily.   ranitidine 150 MG/10ML syrup Commonly known as:  ZANTAC Take 10 mLs (150 mg total) by mouth 2 (two) times daily.   rosuvastatin 10 MG tablet Commonly known as:  CRESTOR TAKE 1 TABLET DAILY PRIOR  TO BEDTIME What changed:    how much to take  how to take this  when to take this  additional instructions   sucralfate 1 GM/10ML suspension Commonly known as:  CARAFATE Take 10 mLs (1 g total) by mouth 4 (four) times daily -  with meals and at bedtime.   traMADol 50 MG tablet Commonly known as:  ULTRAM TAKE 1 TABLET BY MOUTH EVERY 6 HOURS AS NEEDED What changed:    how much to take  how to take this  when to take this            Discharge Care Instructions  (From admission, onward)        Start     Ordered   01/22/18 0000  Change dressing (specify)    Comments:  J tube care   01/22/18 1449      Contact information for follow-up providers    Stark Klein, MD Follow up in 3 week(s).   Specialty:  General Surgery Contact information: [address]  [place] [address] [phone]            Contact information for after-discharge  care    Destination    [facility] .   Service:  Skilled Nursing Contact information: [place] [phone]                  Signed: Stark Klein 01/22/2018, 2:49 PM

## 2018-01-20 NOTE — Progress Notes (Signed)
Patient ID: [name]., male   DOB: 12/01/1932, 84 y.o.   MRN: [id number] 21 Days Post-Op   Subjective: Pt continues to have some nausea.  He states heartburn is less.  Carafate ordered yesterday, but he did not get any doses, noted as "pt/family refused."  He had one episode with 30 mL emesis.   Pt desires to go home.    Objective: Vital signs in last 24 hours: Temp:  [98.3 F (36.8 C)-99.7 F (37.6 C)] 98.3 F (36.8 C) (03/26 0421) Pulse Rate:  [74-95] 95 (03/26 0421) Resp:  [16-18] 18 (03/26 0421) BP: (93-128)/(44-69) 128/69 (03/26 0421) SpO2:  [100 %] 100 % (03/26 0421) Weight:  [81.6 kg (179 lb 14.3 oz)-82.1 kg (181 lb)] 82.1 kg (181 lb) (03/26 0421) Last BM Date: 01/19/18  Intake/Output from previous day: 03/25 0701 - 03/26 0700 In: 1020 [NG/GT:1020] Out: 230 [Urine:200; Emesis/NG output:30] Intake/Output this shift: Total I/O In: 1020 [NG/GT:1020] Out: 200 [Urine:200]  General appearance: alert, cooperative, no distress.  Sleeping, but awakens easily.   GI: normal findings: soft, non-tender and Nondistended.  J-tube site unremarkable. Tube is not taped up.    Lab Results:  Recent Labs    01/18/18 0910 01/20/18 0541  WBC 5.0 5.5  HGB 9.8* 10.4*  HCT 31.3* 33.3*  PLT 309 308   BMET Recent Labs    01/18/18 0910 01/20/18 0541  NA 138 140  K 4.3 4.6  CL 104 108  CO2 26 24  GLUCOSE 117* 109*  BUN 22* 23*  CREATININE 0.90 0.86  CALCIUM 8.3* 8.5*     Studies/Results: No results found.  Anti-infectives: Anti-infectives (From admission, onward)   Start     Dose/Rate Route Frequency Ordered Stop   01/05/18 1000  vancomycin (VANCOCIN) IVPB 1000 mg/200 mL premix  Status:  Discontinued     1,000 mg 200 mL/hr over 60 Minutes Intravenous Every 24 hours 01/04/18 0956 01/05/18 1319   01/04/18 1030  vancomycin (VANCOCIN) 1,500 mg in sodium chloride 0.9 % 500 mL IVPB     1,500 mg 250 mL/hr over 120 Minutes Intravenous  Once 01/04/18 0956 01/04/18 1314    01/03/18 0200  piperacillin-tazobactam (ZOSYN) IVPB 3.375 g  Status:  Discontinued     3.375 g 12.5 mL/hr over 240 Minutes Intravenous Every 8 hours 01/03/18 0112 01/11/18 1002   12/30/17 1714  ceFAZolin (ANCEF) 2-4 GM/100ML-% IVPB    Note to Pharmacy:  Otelia Sergeant   : cabinet override      12/30/17 1714 12/31/17 0529   12/30/17 1600  ceFAZolin (ANCEF) IVPB 2g/100 mL premix     2 g 200 mL/hr over 30 Minutes Intravenous Every 8 hours 12/30/17 1356 12/30/17 1747   12/30/17 0545  ceFAZolin (ANCEF) 2-4 GM/100ML-% IVPB    Note to Pharmacy:  Waldron Session   : cabinet override      12/30/17 0545 12/30/17 0756   12/30/17 0542  ceFAZolin (ANCEF) IVPB 2g/100 mL premix     2 g 200 mL/hr over 30 Minutes Intravenous On call to O.R. 12/30/17 0542 12/30/17 0826      Assessment/Plan: s/p Procedure(s): DIAGNOSTIC LAPAROSCOPY WITH OPEN DISTAL GASTRECTOMY AND PLACEMENT JEJUNOSTOMY FEEDING TUBE Postoperative pneumonia and VDR F, resolved Goal feeds, delayed gastric emptying.  Bile reflux Try carafate today.   Postop GI bleed.  Likely staple line bleed.  Anticoagulation held.  Stable since transfusion.  Labs stable today in terms of electrolytes and CBC.    Tentative d/c plan Wednesday to  SNF.     LOS: 21 days    Stark Klein 01/20/2018

## 2018-01-20 NOTE — Progress Notes (Signed)
Physical Therapy Treatment Patient Details Name: [name]. MRN: [id number] DOB: 1932-12-29 Today's Date: 01/20/2018    History of Present Illness 84 yo male admitted 12/30/17 with recurrent gastric cancer. S/P DIAGNOSTIC LAPAROSCOPY WITH OPEN DISTAL GASTRECTOMY AND PLACEMENT JEJUNOSTOMY FEEDING TUBE (N/A) - EPIDURAL Required Intubation for ;ow BP, increased confusion over weekend.    PT Comments    Pt appeared somewhat down on today. He wants to go home- "there's no place like home, you know". He walked ~175 feet, slowly and with close guarding for safety. He was mildly fatigued after walk on today. He also c/o some abd pain and ongoing nausea issues.     Follow Up Recommendations  SNF     Equipment Recommendations  None recommended by PT    Recommendations for Other Services       Precautions / Restrictions Precautions Precautions: Fall Precaution Comments: PEG Restrictions Weight Bearing Restrictions: No    Mobility  Bed Mobility Overal bed mobility: Needs Assistance Bed Mobility: Supine to Sit;Sit to Supine     Supine to sit: Supervision Sit to supine: Supervision   General bed mobility comments: for lines, safety  Transfers Overall transfer level: Needs assistance Equipment used: Rolling walker (2 wheeled) Transfers: Sit to/from Stand Sit to Stand: Supervision         General transfer comment: for safety. VCs hand placement.   Ambulation/Gait Ambulation/Gait assistance: Min guard Ambulation Distance (Feet): 175 Feet Assistive device: Rolling walker (2 wheeled) Gait Pattern/deviations: Step-through pattern;Trunk flexed;Decreased stride length     General Gait Details: mildly unsteady during ambulation-still requires walker. slow gait speed.    Stairs            Wheelchair Mobility    Modified Rankin (Stroke Patients Only)       Balance Overall balance assessment: Needs assistance         Standing balance support:  Bilateral upper extremity supported Standing balance-Leahy Scale: Poor                              Cognition Arousal/Alertness: Awake/alert Behavior During Therapy: WFL for tasks assessed/performed Overall Cognitive Status: Within Functional Limits for tasks assessed                                 General Comments: c/o feeling tired and discouraged about his slow progress       Exercises      General Comments        Pertinent Vitals/Pain Pain Assessment: Faces Faces Pain Scale: Hurts a little bit Pain Location: abdomen  Pain Descriptors / Indicators: Discomfort Pain Intervention(s): Monitored during session    Home Living                      Prior Function            PT Goals (current goals can now be found in the care plan section) Progress towards PT goals: Progressing toward goals    Frequency    Min 2X/week      PT Plan Current plan remains appropriate    Co-evaluation              AM-PAC PT "6 Clicks" Daily Activity  Outcome Measure  Difficulty turning over in bed (including adjusting bedclothes, sheets and blankets)?: A Little Difficulty moving from lying on back to sitting on  the side of the bed? : A Little Difficulty sitting down on and standing up from a chair with arms (e.g., wheelchair, bedside commode, etc,.)?: A Little Help needed moving to and from a bed to chair (including a wheelchair)?: A Little Help needed walking in hospital room?: A Little Help needed climbing 3-5 steps with a railing? : A Lot 6 Click Score: 17    End of Session   Activity Tolerance: Patient tolerated treatment well Patient left: in bed;with call bell/phone within reach;with bed alarm set   PT Visit Diagnosis: Unsteadiness on feet (R26.81);Muscle weakness (generalized) (M62.81)     Time: 1572-6203 PT Time Calculation (min) (ACUTE ONLY): 8 min  Charges:  $Gait Training: 8-22 mins                    G Codes:           Weston Anna, MPT Pager: [phone]

## 2018-01-21 ENCOUNTER — Inpatient Hospital Stay (HOSPITAL_COMMUNITY): Payer: Medicare Other

## 2018-01-21 ENCOUNTER — Encounter (HOSPITAL_COMMUNITY): Payer: Self-pay | Admitting: Radiology

## 2018-01-21 DIAGNOSIS — R1310 Dysphagia, unspecified: Secondary | ICD-10-CM

## 2018-01-21 LAB — GLUCOSE, CAPILLARY
GLUCOSE-CAPILLARY: 109 mg/dL — AB (ref 65–99)
GLUCOSE-CAPILLARY: 94 mg/dL (ref 65–99)
Glucose-Capillary: 110 mg/dL — ABNORMAL HIGH (ref 65–99)
Glucose-Capillary: 101 mg/dL — ABNORMAL HIGH (ref 65–99)
Glucose-Capillary: 98 mg/dL (ref 65–99)

## 2018-01-21 MED ORDER — FLUCONAZOLE 40 MG/ML PO SUSR
100.0000 mg | Freq: Every day | ORAL | Status: DC
  Administered 2018-01-21 – 2018-01-22 (×2): 100 mg
  Filled 2018-01-21 (×2): qty 2.5

## 2018-01-21 MED ORDER — IOPAMIDOL (ISOVUE-300) INJECTION 61%
INTRAVENOUS | Status: AC
  Administered 2018-01-21: 15 mL
  Filled 2018-01-21: qty 30

## 2018-01-21 MED ORDER — IOPAMIDOL (ISOVUE-300) INJECTION 61%
100.0000 mL | Freq: Once | INTRAVENOUS | Status: AC | PRN
  Administered 2018-01-21: 100 mL via INTRAVENOUS

## 2018-01-21 MED ORDER — IOPAMIDOL (ISOVUE-300) INJECTION 61%
INTRAVENOUS | Status: AC
  Filled 2018-01-21: qty 100

## 2018-01-21 MED ORDER — IOPAMIDOL (ISOVUE-300) INJECTION 61%: 15.0000 mL | Freq: Once | INTRAVENOUS | Status: AC | PRN

## 2018-01-21 NOTE — Progress Notes (Signed)
Patient ID: [name]., male   DOB: 09/24/1933, 84 y.o.   MRN: [id number] 22 Days Post-Op   Subjective: Pt refusing carafate because he said "any liquid will come right back up."  He also continues to complain of some pain at the site of the feeding tube.    Objective: Vital signs in last 24 hours: Temp:  [97.9 F (36.6 C)-98.5 F (36.9 C)] 97.9 F (36.6 C) (03/27 0410) Pulse Rate:  [80-94] 94 (03/27 0410) Resp:  [16-20] 16 (03/27 0410) BP: (109-124)/(56-68) 109/68 (03/27 0410) SpO2:  [100 %] 100 % (03/27 0410) Last BM Date: 01/19/18  Intake/Output from previous day: 03/26 0701 - 03/27 0700 In: 1200 [P.O.:180; NG/GT:1020] Out: 651 [Urine:651] Intake/Output this shift: Total I/O In: 2440 [P.O.:30; Other:1000; NG/GT:1410] Out: 1000 [Urine:1000]  General appearance: alert, cooperative, looks uncomfortable.   GI: normal findings: soft, non-tender and Nondistended.  J-tube site unremarkable. Tube is not taped up.    Lab Results:  Recent Labs    01/20/18 0541  WBC 5.5  HGB 10.4*  HCT 33.3*  PLT 308   BMET Recent Labs    01/20/18 0541  NA 140  K 4.6  CL 108  CO2 24  GLUCOSE 109*  BUN 23*  CREATININE 0.86  CALCIUM 8.5*     Studies/Results: Ct Abdomen Pelvis W Contrast  Result Date: 01/21/2018 CLINICAL DATA:  Evaluate for abdominal infection. History of lymphoma. Status post chemotherapy. EXAM: CT ABDOMEN AND PELVIS WITH CONTRAST TECHNIQUE: Multidetector CT imaging of the abdomen and pelvis was performed using the standard protocol following bolus administration of intravenous contrast. CONTRAST:  18mL ISOVUE-300 IOPAMIDOL (ISOVUE-300) INJECTION 61% COMPARISON:  12/16/2017. FINDINGS: Lower chest: Scar versus subsegmental atelectasis identified within the lung bases. Bilateral areas of bronchiectasis also noted. Hepatobiliary: No focal liver abnormality is seen. No gallstones, gallbladder wall thickening, or biliary dilatation. Pancreas: Unremarkable. No  pancreatic ductal dilatation or surrounding inflammatory changes. Spleen: Normal in size without focal abnormality. Adrenals/Urinary Tract: The adrenal glands appear normal. No kidney mass or hydronephrosis. Urinary bladder is normal. Stomach/Bowel: Status post partial gastrectomy and gastrojejunostomy. There is no abnormal dilatation of the bowel loops. Enteric contrast material is identified within the mid and distal small bowel loops. No extravasation of contrast material identified. A percutaneous jejunostomy tube is identified. No complications associated with the catheter. Vascular/Lymphatic: Aortic atherosclerosis. No aneurysm. No upper abdominal adenopathy. No pelvic or inguinal adenopathy. Reproductive: Prostate is unremarkable. Other: There is no free fluid or fluid collections identified. Musculoskeletal: There is degenerative disc disease identified within the lumbar spine. No suspicious bone lesions. IMPRESSION: 1. Postoperative changes compatible with distal gastrectomy and jejunostomy tube placement. No complicating features identified. 2. No fluid collections identified to suggest abscess. 3.  Aortic Atherosclerosis (ICD10-I70.0). Electronically Signed   By: Kerby Moors M.D.   On: 01/21/2018 12:10    Anti-infectives: Anti-infectives (From admission, onward)   Start     Dose/Rate Route Frequency Ordered Stop   01/05/18 1000  vancomycin (VANCOCIN) IVPB 1000 mg/200 mL premix  Status:  Discontinued     1,000 mg 200 mL/hr over 60 Minutes Intravenous Every 24 hours 01/04/18 0956 01/05/18 1319   01/04/18 1030  vancomycin (VANCOCIN) 1,500 mg in sodium chloride 0.9 % 500 mL IVPB     1,500 mg 250 mL/hr over 120 Minutes Intravenous  Once 01/04/18 0956 01/04/18 1314   01/03/18 0200  piperacillin-tazobactam (ZOSYN) IVPB 3.375 g  Status:  Discontinued     3.375 g 12.5 mL/hr over  240 Minutes Intravenous Every 8 hours 01/03/18 0112 01/11/18 1002   12/30/17 1714  ceFAZolin (ANCEF) 2-4 GM/100ML-%  IVPB    Note to Pharmacy:  Otelia Sergeant   : cabinet override      12/30/17 1714 12/31/17 0529   12/30/17 1600  ceFAZolin (ANCEF) IVPB 2g/100 mL premix     2 g 200 mL/hr over 30 Minutes Intravenous Every 8 hours 12/30/17 1356 12/30/17 1747   12/30/17 0545  ceFAZolin (ANCEF) 2-4 GM/100ML-% IVPB    Note to Pharmacy:  Waldron Session   : cabinet override      12/30/17 0545 12/30/17 0756   12/30/17 0542  ceFAZolin (ANCEF) IVPB 2g/100 mL premix     2 g 200 mL/hr over 30 Minutes Intravenous On call to O.R. 12/30/17 0542 12/30/17 0826      Assessment/Plan: s/p Procedure(s): DIAGNOSTIC LAPAROSCOPY WITH OPEN DISTAL GASTRECTOMY AND PLACEMENT JEJUNOSTOMY FEEDING TUBE Postoperative pneumonia and VDRF, resolved Goal feeds, delayed gastric emptying.  Bile reflux Encouraged pt to try carafate, as I think this is most likely cause for refu  Postop GI bleed.  Likely staple line bleed.  Anticoagulation held.  Stable since transfusion.  Getting CT to eval abdominal pain prior to d/c.   Will get Dr. Ardis Hughs to eval and consider EGD prior to d/c.   Will move discharge to tomorrow/friday.   LOS: 22 days    Stark Klein 01/21/2018

## 2018-01-21 NOTE — Progress Notes (Signed)
rm 1529, [name], 84, SP Lap open Gtube and placement of JTube, POD#22. Vomiting again. c/o Indigestion, but Protonix is EC. IV Protonix.

## 2018-01-21 NOTE — Progress Notes (Signed)
Pt's daughter is requesting pill forms of carafate to be hidden in applesauce if possible. Also she is requesting that [name] be discharged to the facility today 3/27 because he is becoming increasingly depressed.

## 2018-01-21 NOTE — Clinical Social Work Placement (Addendum)
D/C summary sent.  4:30pm [facility] called to transport the patient.    CLINICAL SOCIAL WORK PLACEMENT  NOTE  Date:  01/21/2018  Patient Details  Name: [name]. MRN: [id number] Date of Birth: Oct 04, 1933  Clinical Social Work is seeking post-discharge placement for this patient at the [place] level of care (*CSW will initial, date and re-position this form in  chart as items are completed):  Yes   Patient/family provided with [facility]'s list of facilities offering this level of care within the geographic area requested by the patient (or if unable, by the patient's family).  Yes   Patient/family informed of their freedom to choose among providers that offer the needed level of care, that participate in Medicare, Medicaid or managed care program needed by the patient, have an available bed and are willing to accept the patient.  Yes   Patient/family informed of [facility]'s ownership interest in [facility] and [facility], as well as of the fact that they are under no obligation to receive care at these facilities.  PASRR submitted to EDS on    3/14/ 2019  PASRR number received on     01/08/2018  Existing PASRR number confirmed on       FL2 transmitted to all facilities in geographic area requested by pt/family on       FL2 transmitted to all facilities within larger geographic area on       Patient informed that his/her managed care company has contracts with or will negotiate with certain facilities, including the following:  [employer] informed of bed offers received.  Patient chooses bed at [facility]     Physician recommends and patient chooses bed at      Patient to be transferred to [facility] on  .  Patient to be transferred to facility by    [facility]    Patient family notified on   of transfer. 3/28/209  Name of family member notified:       Spouse at bedside.   PHYSICIAN       Additional Comment:    _______________________________________________ Lia Hopping, LCSW 01/21/2018, 9:02 AM
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
## 2018-01-21 NOTE — Progress Notes (Signed)
Notified physician, Dr. Marcello Moores who  said to give patient Maalox,, Place order But patient is refusing Maalox as he stated he will vomit if he takes.

## 2018-01-22 ENCOUNTER — Encounter (HOSPITAL_COMMUNITY): Payer: Self-pay | Admitting: *Deleted

## 2018-01-22 ENCOUNTER — Inpatient Hospital Stay (HOSPITAL_COMMUNITY): Payer: Medicare Other | Admitting: Anesthesiology

## 2018-01-22 ENCOUNTER — Encounter (HOSPITAL_COMMUNITY)
Admission: RE | Disposition: A | Discharge status: Skilled Nursing Facility | Payer: Self-pay | Source: Ambulatory Visit | Attending: General Surgery

## 2018-01-22 DIAGNOSIS — R1310 Dysphagia, unspecified: Secondary | ICD-10-CM | POA: Diagnosis not present

## 2018-01-22 DIAGNOSIS — Z8572 Personal history of non-Hodgkin lymphomas: Secondary | ICD-10-CM | POA: Diagnosis not present

## 2018-01-22 DIAGNOSIS — E440 Moderate protein-calorie malnutrition: Secondary | ICD-10-CM | POA: Diagnosis not present

## 2018-01-22 DIAGNOSIS — R12 Heartburn: Secondary | ICD-10-CM

## 2018-01-22 DIAGNOSIS — Z978 Presence of other specified devices: Secondary | ICD-10-CM | POA: Diagnosis not present

## 2018-01-22 DIAGNOSIS — C162 Malignant neoplasm of body of stomach: Secondary | ICD-10-CM | POA: Diagnosis not present

## 2018-01-22 DIAGNOSIS — Z85828 Personal history of other malignant neoplasm of skin: Secondary | ICD-10-CM | POA: Diagnosis not present

## 2018-01-22 DIAGNOSIS — I110 Hypertensive heart disease with heart failure: Secondary | ICD-10-CM | POA: Diagnosis not present

## 2018-01-22 DIAGNOSIS — I25119 Atherosclerotic heart disease of native coronary artery with unspecified angina pectoris: Secondary | ICD-10-CM | POA: Diagnosis not present

## 2018-01-22 DIAGNOSIS — C801 Malignant (primary) neoplasm, unspecified: Secondary | ICD-10-CM | POA: Diagnosis not present

## 2018-01-22 DIAGNOSIS — F322 Major depressive disorder, single episode, severe without psychotic features: Secondary | ICD-10-CM | POA: Diagnosis not present

## 2018-01-22 DIAGNOSIS — D649 Anemia, unspecified: Secondary | ICD-10-CM | POA: Diagnosis not present

## 2018-01-22 DIAGNOSIS — C169 Malignant neoplasm of stomach, unspecified: Secondary | ICD-10-CM | POA: Diagnosis not present

## 2018-01-22 DIAGNOSIS — E876 Hypokalemia: Secondary | ICD-10-CM | POA: Diagnosis not present

## 2018-01-22 DIAGNOSIS — M6281 Muscle weakness (generalized): Secondary | ICD-10-CM | POA: Diagnosis not present

## 2018-01-22 DIAGNOSIS — R100 Acute abdomen: Secondary | ICD-10-CM | POA: Diagnosis not present

## 2018-01-22 DIAGNOSIS — R0603 Acute respiratory distress: Secondary | ICD-10-CM | POA: Diagnosis not present

## 2018-01-22 DIAGNOSIS — R531 Weakness: Secondary | ICD-10-CM | POA: Diagnosis not present

## 2018-01-22 DIAGNOSIS — J9600 Acute respiratory failure, unspecified whether with hypoxia or hypercapnia: Secondary | ICD-10-CM | POA: Diagnosis not present

## 2018-01-22 DIAGNOSIS — K210 Gastro-esophageal reflux disease with esophagitis: Secondary | ICD-10-CM | POA: Diagnosis not present

## 2018-01-22 DIAGNOSIS — I2510 Atherosclerotic heart disease of native coronary artery without angina pectoris: Secondary | ICD-10-CM | POA: Diagnosis not present

## 2018-01-22 DIAGNOSIS — R112 Nausea with vomiting, unspecified: Secondary | ICD-10-CM

## 2018-01-22 DIAGNOSIS — K209 Esophagitis, unspecified: Secondary | ICD-10-CM | POA: Diagnosis not present

## 2018-01-22 DIAGNOSIS — K2970 Gastritis, unspecified, without bleeding: Secondary | ICD-10-CM | POA: Diagnosis not present

## 2018-01-22 DIAGNOSIS — R1012 Left upper quadrant pain: Secondary | ICD-10-CM | POA: Diagnosis present

## 2018-01-22 DIAGNOSIS — E782 Mixed hyperlipidemia: Secondary | ICD-10-CM | POA: Diagnosis not present

## 2018-01-22 DIAGNOSIS — R634 Abnormal weight loss: Secondary | ICD-10-CM | POA: Diagnosis not present

## 2018-01-22 DIAGNOSIS — I509 Heart failure, unspecified: Secondary | ICD-10-CM | POA: Diagnosis not present

## 2018-01-22 DIAGNOSIS — Z934 Other artificial openings of gastrointestinal tract status: Secondary | ICD-10-CM | POA: Diagnosis not present

## 2018-01-22 DIAGNOSIS — K219 Gastro-esophageal reflux disease without esophagitis: Secondary | ICD-10-CM | POA: Diagnosis not present

## 2018-01-22 DIAGNOSIS — R410 Disorientation, unspecified: Secondary | ICD-10-CM | POA: Diagnosis not present

## 2018-01-22 DIAGNOSIS — A419 Sepsis, unspecified organism: Secondary | ICD-10-CM | POA: Diagnosis not present

## 2018-01-22 DIAGNOSIS — F4321 Adjustment disorder with depressed mood: Secondary | ICD-10-CM | POA: Diagnosis not present

## 2018-01-22 DIAGNOSIS — C8318 Mantle cell lymphoma, lymph nodes of multiple sites: Secondary | ICD-10-CM | POA: Diagnosis not present

## 2018-01-22 DIAGNOSIS — R1312 Dysphagia, oropharyngeal phase: Secondary | ICD-10-CM | POA: Diagnosis not present

## 2018-01-22 DIAGNOSIS — R2681 Unsteadiness on feet: Secondary | ICD-10-CM | POA: Diagnosis not present

## 2018-01-22 DIAGNOSIS — G9341 Metabolic encephalopathy: Secondary | ICD-10-CM | POA: Diagnosis not present

## 2018-01-22 DIAGNOSIS — R278 Other lack of coordination: Secondary | ICD-10-CM | POA: Diagnosis not present

## 2018-01-22 DIAGNOSIS — R5383 Other fatigue: Secondary | ICD-10-CM | POA: Diagnosis not present

## 2018-01-22 DIAGNOSIS — D509 Iron deficiency anemia, unspecified: Secondary | ICD-10-CM | POA: Diagnosis not present

## 2018-01-22 DIAGNOSIS — R109 Unspecified abdominal pain: Secondary | ICD-10-CM | POA: Diagnosis not present

## 2018-01-22 DIAGNOSIS — Z7389 Other problems related to life management difficulty: Secondary | ICD-10-CM | POA: Diagnosis not present

## 2018-01-22 DIAGNOSIS — R6521 Severe sepsis with septic shock: Secondary | ICD-10-CM | POA: Diagnosis not present

## 2018-01-22 DIAGNOSIS — Z79899 Other long term (current) drug therapy: Secondary | ICD-10-CM | POA: Diagnosis not present

## 2018-01-22 DIAGNOSIS — K254 Chronic or unspecified gastric ulcer with hemorrhage: Secondary | ICD-10-CM | POA: Diagnosis not present

## 2018-01-22 DIAGNOSIS — E785 Hyperlipidemia, unspecified: Secondary | ICD-10-CM | POA: Diagnosis not present

## 2018-01-22 DIAGNOSIS — I10 Essential (primary) hypertension: Secondary | ICD-10-CM | POA: Diagnosis not present

## 2018-01-22 DIAGNOSIS — J189 Pneumonia, unspecified organism: Secondary | ICD-10-CM | POA: Diagnosis not present

## 2018-01-22 DIAGNOSIS — R1084 Generalized abdominal pain: Secondary | ICD-10-CM | POA: Diagnosis not present

## 2018-01-22 DIAGNOSIS — R10812 Left upper quadrant abdominal tenderness: Secondary | ICD-10-CM | POA: Diagnosis not present

## 2018-01-22 DIAGNOSIS — E43 Unspecified severe protein-calorie malnutrition: Secondary | ICD-10-CM | POA: Diagnosis not present

## 2018-01-22 DIAGNOSIS — R5381 Other malaise: Secondary | ICD-10-CM | POA: Diagnosis not present

## 2018-01-22 HISTORY — PX: ESOPHAGOGASTRODUODENOSCOPY (EGD) WITH PROPOFOL: SHX5813

## 2018-01-22 LAB — GLUCOSE, CAPILLARY
GLUCOSE-CAPILLARY: 124 mg/dL — AB (ref 65–99)
GLUCOSE-CAPILLARY: 125 mg/dL — AB (ref 65–99)
Glucose-Capillary: 104 mg/dL — ABNORMAL HIGH (ref 65–99)
Glucose-Capillary: 98 mg/dL (ref 65–99)

## 2018-01-22 SURGERY — ESOPHAGOGASTRODUODENOSCOPY (EGD) WITH PROPOFOL
Anesthesia: Monitor Anesthesia Care

## 2018-01-22 MED ORDER — FAMOTIDINE 20 MG PO TABS: 20.0000 mg | ORAL_TABLET | Freq: Every day | ORAL | Status: DC

## 2018-01-22 MED ORDER — LIDOCAINE HCL (CARDIAC) 20 MG/ML IV SOLN
INTRAVENOUS | Status: DC | PRN
  Administered 2018-01-22: 50 mg via INTRAVENOUS

## 2018-01-22 MED ORDER — FLUCONAZOLE 40 MG/ML PO SUSR: 100.0000 mg | Freq: Every day | ORAL | 0 refills | Status: DC

## 2018-01-22 MED ORDER — PROPOFOL 10 MG/ML IV BOLUS
INTRAVENOUS | Status: AC
  Filled 2018-01-22: qty 20

## 2018-01-22 MED ORDER — LACTATED RINGERS IV SOLN
INTRAVENOUS | Status: DC
Start: 2018-01-22 — End: 2018-01-22
  Administered 2018-01-22: 1000 mL via INTRAVENOUS

## 2018-01-22 MED ORDER — PROPOFOL 500 MG/50ML IV EMUL
INTRAVENOUS | Status: DC | PRN
  Administered 2018-01-22: 300 ug/kg/min via INTRAVENOUS

## 2018-01-22 MED ORDER — PHENYLEPHRINE HCL 10 MG/ML IJ SOLN
INTRAMUSCULAR | Status: DC | PRN
  Administered 2018-01-22: 100 ug via INTRAVENOUS

## 2018-01-22 MED ORDER — ACETAMINOPHEN 160 MG/5ML PO SOLN: 650.0000 mg | ORAL | 0 refills | Status: DC | PRN

## 2018-01-22 MED ORDER — RANITIDINE HCL 150 MG/10ML PO SYRP: 150.0000 mg | ORAL_SOLUTION | Freq: Two times a day (BID) | ORAL | 0 refills | Status: DC

## 2018-01-22 SURGICAL SUPPLY — 15 items

## 2018-01-22 NOTE — Transfer of Care (Signed)
Immediate Anesthesia Transfer of Care Note  Patient: [name].  Procedure(s) Performed: ESOPHAGOGASTRODUODENOSCOPY (EGD) WITH PROPOFOL (N/A )  Patient Location: PACU  Anesthesia Type:MAC  Level of Consciousness: sedated, patient cooperative and responds to stimulation  Airway & Oxygen Therapy: Patient Spontanous Breathing and Patient connected to nasal cannula oxygen  Post-op Assessment: Report given to RN and Post -op Vital signs reviewed and stable  Post vital signs: Reviewed and stable  Last Vitals:  Vitals Value Taken Time  BP 96/48 01/22/2018 12:28 PM  Temp    Pulse 70 01/22/2018 12:29 PM  Resp 15 01/22/2018 12:29 PM  SpO2 100 % 01/22/2018 12:29 PM  Vitals shown include unvalidated device data.  Last Pain:  Vitals:   01/22/18 1130  TempSrc: Oral  PainSc: 0-No pain      Patients Stated Pain Goal: 3 (54/62/70 3500)  Complications: No apparent anesthesia complications

## 2018-01-22 NOTE — Anesthesia Preprocedure Evaluation (Addendum)
Anesthesia Evaluation    Airway Mallampati: II       Dental  (+) Edentulous Upper, Edentulous Lower   Pulmonary    Pulmonary exam normal breath sounds clear to auscultation       Cardiovascular hypertension, Normal cardiovascular exam Rhythm:Regular Rate:Normal     Neuro/Psych    GI/Hepatic GERD  ,  Endo/Other    Renal/GU      Musculoskeletal   Abdominal Normal abdominal exam  (+)   Peds  Hematology  (+) Blood dyscrasia, anemia ,   Anesthesia Other Findings [name].  ECHO COMPLETE WO IMAGING ENHANCING AGENT  Order# [id number]  Reading physician: Josue Hector, MD Ordering physician: Fay Records, MD Study date: 12/25/17 Result Notes for ECHOCARDIOGRAM COMPLETE   Notes recorded by Mady Haagensen, CMA on 01/02/2018 at 5:15 PM EST Called and spoke with patients wife [name], ok per patient DPR. [name] is aware of ECHO results. She states that patient is in the hospital now and she will tell him results. They will follow up after patient is out of the hospital. ------  Notes recorded by Lynann Bologna, RN on 01/01/2018 at 10:30 AM EST Left pt a message to call back. ------  Notes recorded by Fay Records, MD on 12/26/2017 at 1:53 PM EST Difficult acoustic windows I have reviewed images  Pumping function of the heart I moderately down Somewhat less than previous echo For now I would keep on same meds  REcomm watching fluids (ins/outs) closely in periop period. Cardiology service will be availble s needed.' Please mak sure pt hs f/u in cardiology  Will forward echo to surgery for review   Study Result   Result status: Final result                          [facility]                        [address], [place] [address]                            [phone]  ------------------------------------------------------------------- Transthoracic  Echocardiography [name].  GATED SPECT MYO PERF [facility] STRESS 1D  Order# [id number]  Reading physician: Dorothy Spark, MD Ordering physician: Eileen Stanford, PA-C Study date: 10/16/16 Patient Information   Name MRN Description [name] [id number] 84 y.o. Male Result Notes for Myocardial Perfusion Imaging   Notes Recorded by Jeanann Lewandowsky, RMA on 10/17/2016 at 11:33 AM EST Pt aware of his stress test results and he verbalized understanding. ------  Notes Recorded by Jeanann Lewandowsky, RMA on 10/17/2016 at 9:59 AM EST Lmptcb jw 10/17/16 ------  Notes Recorded by Eileen Stanford, PA-C on 10/16/2016 at 8:25 PM EST Stress test shows evidence of previous heart attack but no new blockages. His heart function is mildly reduced similar to prior echocardiogram. No changes in plan. Follow up with Dr. Harrington Challenger as previously scheduled.   Vitals   Height Weight BMI (Calculated) 6' (1.829 m) 207 lb (93.9 kg) 28.1 Study Highlights     Nuclear stress EF: 48%.  There was no ST segment deviation noted during stress.  Defect 1: There is a medium defect  of moderate severity present in the basal inferoseptal, basal inferior, mid inferoseptal, mid inferior and apical inferior location.  Findings consistent with prior myocardial infarction.  This is a low risk study.  The left ventricular ejection fraction is mildly decreased (45-54%).   There is a prior infarct in the basal and mid inferior and inferoseptal walls with no associated ischemia.  There is hypokinesis of the basal and mid inferior and inferoseptal walls, overall LVEF is mildly decreased.     Patient:    [name], [name] MR #:       [id number] Study Date: 12/25/2017 Gender:     M Age:        29 Height:     182.9 cm Weight:     85 kg BSA:        2.09 m^2 Pt. Status: Room:   ATTENDING    Jenkins Rouge, M.D.  ORDERING     Dorris Carnes, M.D.  REFERRING    Dorris Carnes, M.D.   SONOGRAPHER  Diamond Nickel  PERFORMING   [facility] Outpatient  cc:  ------------------------------------------------------------------- LV EF: 35% -   40%  ------------------------------------------------------------------- Indications:      Z01.810 Preoperative cardiovascular clearance.   ------------------------------------------------------------------- History:   PMH:  PVCs. Lymphoma. Lower extremity edema.  Coronary artery disease.  Risk factors:  Hypertension. Dyslipidemia.  ------------------------------------------------------------------- Study Conclusions  - Left ventricle: Diffuse hypokinesis worse in the septum , apex   and mid-basal inferior wall. Wall thickness was increased in a   pattern of mild LVH. Systolic function was moderately reduced.   The estimated ejection fraction was in the range of 35% to 40%.   Doppler parameters are consistent with abnormal left ventricular   relaxation (grade 1 diastolic dysfunction). - Aortic valve: There was mild regurgitation. - Left atrium: The atrium was mildly dilated. - Atrial septum: No defect or patent foramen ovale was identified.  ------------------------------------------------------------------- Study data:  Comparison was made to the study of 03/18/2017.  Study status:  Routine.  Procedure:  The patient reported no pain pre or post test. Transthoracic echocardiography. Image quality was adequate.  Study completion:  There were no complications. Transthoracic echocardiography.  M-mode, complete 2D, spectral Doppler, and color Doppler.  Birthdate:  Patient birthdate: 1932-12-28.  Age:  Patient is 84 yr old.  Sex:  Gender: male. BMI: 25.4 kg/m^2.  Blood pressure:     168/88  Patient status: Outpatient.  Study date:  Study date: 12/25/2017. Study time: 12:49 PM.  Location:  [facility]  -------------------------------------------------------------------  ------------------------------------------------------------------- Left ventricle:  Diffuse hypokinesis worse in the septum , apex and mid-basal inferior wall.  Wall thickness was increased in a pattern of mild LVH.   Systolic function was moderately reduced. The estimated ejection fraction was in the range of 35% to 40%. Doppler parameters are consistent with abnormal left ventricular relaxation (grade 1 diastolic dysfunction).  ------------------------------------------------------------------- Aortic valve:   Trileaflet.  Doppler:   There was no stenosis. There was mild regurgitation.  ------------------------------------------------------------------- Aorta:  The aorta was normal, not dilated, and non-diseased.  ------------------------------------------------------------------- Mitral valve:   Mildly thickened leaflets .  Doppler:  There was trivial regurgitation.  ------------------------------------------------------------------- Left atrium:  The atrium was mildly dilated.  ------------------------------------------------------------------- Atrial septum:  No defect or patent foramen ovale was identified.   ------------------------------------------------------------------- Right ventricle:  The cavity size was normal. Wall thickness was normal. Systolic function was normal.  ------------------------------------------------------------------- Pulmonic valve:    Doppler:  There was mild regurgitation.  -------------------------------------------------------------------  Tricuspid valve:   Doppler:  There was mild regurgitation.  ------------------------------------------------------------------- Right atrium:  The atrium was normal in size.  ------------------------------------------------------------------- Pericardium:  The pericardium was normal in  appearance.  ------------------------------------------------------------------- Systemic veins: Inferior vena cava: The vessel was normal in size. The respirophasic diameter changes were in the normal range (>= 50%), consistent with normal central venous pressure.  ------------------------------------------------------------------- Post procedure conclusions Ascending Aorta:  - The aorta was normal, not dilated, and non-diseased.  ------------------------------------------------------------------- Measurements   Left ventricle                             Value        Reference  LV ID, ED, PLAX chordal          (H)       53.9  mm     43 - 52  LV ID, ES, PLAX chordal          (H)       39.3  mm     23 - 38  LV fx shortening, PLAX chordal   (L)       27    %      >=29  LV PW thickness, ED                        13.8  mm     ---------  IVS/LV PW ratio, ED                        1            <=1.3  LV e&', lateral                             4.28  cm/s   ---------  LV E/e&', lateral                           13.83        ---------  LV e&', medial                              4.93  cm/s   ---------  LV E/e&', medial                            12.01        ---------  LV e&', average                             4.61  cm/s   ---------  LV E/e&', average                           12.86        ---------    Ventricular septum                         Value        Reference  IVS thickness, ED                          13.8  mm     ---------  LVOT                                       Value        Reference  LVOT ID, S                                 21    mm     ---------  LVOT area                                  3.46  cm^2   ---------    Aortic valve                               Value        Reference  Aortic regurg peak velocity                206   cm/s   ---------  Aortic regurg pressure half-time           468   ms     ---------  Aortic regurg peak gradient                17     mm Hg  ---------    Aorta                                      Value        Reference  Aortic root ID, ED                         35    mm     ---------    Left atrium                                Value        Reference  LA ID, A-P, ES                             42    mm     ---------  LA ID/bsa, A-P                             2.01  cm/m^2 <=2.2  LA volume, S                               77    ml     ---------  LA volume/bsa, S                           36.9  ml/m^2 ---------  LA volume, ES, 1-p A4C                     62    ml     ---------  LA volume/bsa, ES, 1-p A4C  29.7  ml/m^2 ---------  LA volume, ES, 1-p A2C                     80    ml     ---------  LA volume/bsa, ES, 1-p A2C                 38.3  ml/m^2 ---------    Mitral valve                               Value        Reference  Mitral E-wave peak velocity                59.2  cm/s   ---------  Mitral A-wave peak velocity                96.3  cm/s   ---------  Mitral deceleration time         (H)       384   ms     150 - 230  Mitral E/A ratio, peak                     0.6          ---------    Right ventricle                            Value        Reference  RV s&', lateral, S                          9.1   cm/s   ---------  Legend: (L)  and  (H)  mark values outside specified reference range.  ------------------------------------------------------------------- Prepared and Electronically Authenticated by  Jenkins Rouge, M.D. 2019-02-28T16:20:52 MERGE Images   Show images for ECHOCARDIOGRAM COMPLETE Patient Information   Patient Name [name], [name]. Sex Male DOB 15-May-1933 SSN [id number] Reason for Exam  Priority: Routine  Dx: Pre-operative cardiovascular examination (Z01.810 (ICD-10-CM)) Surgical History   Surgical History    No past medical history on file.  Other Surgical History     Procedure Laterality Date Comment Source APPENDECTOMY    Provider COLONOSCOPY    Provider CYST EXCISION Right X 2 shoulder Provider DIAGNOSTIC LAPAROSCOPY   epidural vs. subtotal gastrectomy Dr. Laroy Apple 12-30-17 Provider ELECTROLYSIS OF MISDIRECTED LASHES Bilateral  eyelashes are misdirected and grow inwards  Provider EYE SURGERY    Provider LAPAROSCOPIC GASTRECTOMY N/A 12/30/2017 Procedure: DIAGNOSTIC LAPAROSCOPY WITH OPEN DISTAL GASTRECTOMY AND PLACEMENT JEJUNOSTOMY FEEDING TUBE; Surgeon: Stark Klein, MD; Location: [facility]; Service: General; Laterality: N/A; EPIDURAL Provider MASS EXCISION Right 01/2005 proximal thigh soft tissue mass Provider POLYPECTOMY    Provider TEAR DUCT PROBING Left   Provider TYMPANOPLASTY Bilateral  "for hearing loss; put tubes in also, 2X on right, 1X on the left; tubes worked" Provider VASECTOMY    Provider  Performing Technologist/Nurse   Performing Technologist/Nurse: Valinda Hoar, RCS EF and Strain Measurements    Ejection Fraction TDI e' lateral  4.28   TDI e' medial  4.93      2D Measurements    LV PW d  13.8 mm (Range: 0.6 - 1.1)  FS  27 % (Range: 28 - 44)  LA ID, A-P, ES  42 mm  LA diam end sys  42 mm  LA vol  77 mL  LA vol index  36.9 mL/m2  IVS/LV PW RATIO, ED  1   FS  27 % (Range: 28 - 44)    Right Ventricle Measurements    Lateral S' vel  9.1 cm/sec    Aortic Valve Measurements    Stenosis LVOT diameter  21 mm  LVOT area  3.46 cm2    Regurgitation P 1/2 time  468 ms       Left Atrium Measurements    LA ID, A-P, ES  42 mm  LA vol  77 mL  LA vol index  36.9 mL/m2  LA vol A4C  62 ml    Mitral Valve Measurements    Stenosis MV pk A vel  96.3 m/s    Regurgitation MV Dec  384   E decel time  384 msec    PISA-MS/PISA-MR MV pk E vel  59.2 m/s       Implants    No active implants to display in this view. Order-Level Documents:   There are no  order-level documents.  Encounter-Level Documents - 12/25/2017:   Electronic signature on 12/25/2017 12:14 PM: CSN: [id number] - Signed  Electronic signature on 12/25/2017 12:13 PM - Signed    Signed   Electronically signed by Josue Hector, MD on 12/25/17 at 1620 EST Printable Result Report    Result Report  External Result Report    External Result Report     Reproductive/Obstetrics                           Anesthesia Physical Anesthesia Plan  ASA: III  Anesthesia Plan: MAC   Post-op Pain Management:    Induction:   PONV Risk Score and Plan: 1  Airway Management Planned: Mask and Natural Airway  Additional Equipment:   Intra-op Plan:   Post-operative Plan:   Informed Consent: I have reviewed the patients History and Physical, chart, labs and discussed the procedure including the risks, benefits and alternatives for the proposed anesthesia with the patient or authorized representative who has indicated his/her understanding and acceptance.     Plan Discussed with: CRNA  Anesthesia Plan Comments:         Anesthesia Quick Evaluation                                  Anesthesia Evaluation  Patient identified by MRN, date of birth, ID band Patient awake    Reviewed: Allergy & Precautions, NPO status , Patient's Chart, lab work & pertinent test results  History of Anesthesia Complications Negative for: history of anesthetic complications  Airway Mallampati: II  TM Distance: >3 FB     Dental  (+) Edentulous Upper, Edentulous Lower, Dental Advisory Given   Pulmonary neg pulmonary ROS,    Pulmonary exam normal        Cardiovascular hypertension, Pt. on home beta blockers + CAD and + Cardiac Stents  Normal cardiovascular exam  Study Conclusions  - Left ventricle: Diffuse hypokinesis worse in the septum , apex   and mid-basal inferior wall. Wall thickness was increased in a   pattern of mild LVH. Systolic  function was moderately reduced.   The estimated ejection fraction was in the range of 35% to 40%.   Doppler parameters are consistent with abnormal left ventricular   relaxation (grade 1 diastolic dysfunction). - Aortic valve: There was  mild regurgitation. - Left atrium: The atrium was mildly dilated.   Neuro/Psych negative neurological ROS  negative psych ROS   GI/Hepatic Neg liver ROS, GERD  ,  Endo/Other  negative endocrine ROS  Renal/GU negative Renal ROS     Musculoskeletal negative musculoskeletal ROS (+)   Abdominal   Peds  Hematology negative hematology ROS (+)   Anesthesia Other Findings Day of surgery medications reviewed with the patient.  Reproductive/Obstetrics                            Anesthesia Physical Anesthesia Plan  ASA: III  Anesthesia Plan: General and Epidural   Post-op Pain Management: GA combined w/ [facility] for post-op pain   Induction:   PONV Risk Score and Plan: 3 and Ondansetron, Dexamethasone and Scopolamine patch - Pre-op  Airway Management Planned: Oral ETT  Additional Equipment:   Intra-op Plan:   Post-operative Plan: Possible Post-op intubation/ventilation  Informed Consent: I have reviewed the patients History and Physical, chart, labs and discussed the procedure including the risks, benefits and alternatives for the proposed anesthesia with the patient or authorized representative who has indicated his/her understanding and acceptance.   Dental advisory given  Plan Discussed with: Anesthesiologist, CRNA and Surgeon  Anesthesia Plan Comments: (ERAS protocol)      Anesthesia Quick Evaluation                                   Anesthesia Evaluation  Patient identified by MRN, date of birth, ID band Patient awake    Reviewed: Allergy & Precautions, NPO status , Patient's Chart, lab work & pertinent test results  History of Anesthesia Complications Negative for: history of anesthetic  complications  Airway Mallampati: II  TM Distance: >3 FB     Dental  (+) Edentulous Upper, Edentulous Lower, Dental Advisory Given   Pulmonary neg pulmonary ROS,    Pulmonary exam normal        Cardiovascular hypertension, Pt. on home beta blockers + CAD and + Cardiac Stents  Normal cardiovascular exam  Study Conclusions  - Left ventricle: Diffuse hypokinesis worse in the septum , apex   and mid-basal inferior wall. Wall thickness was increased in a   pattern of mild LVH. Systolic function was moderately reduced.   The estimated ejection fraction was in the range of 35% to 40%.   Doppler parameters are consistent with abnormal left ventricular   relaxation (grade 1 diastolic dysfunction). - Aortic valve: There was mild regurgitation. - Left atrium: The atrium was mildly dilated.   Neuro/Psych negative neurological ROS  negative psych ROS   GI/Hepatic Neg liver ROS, GERD  ,  Endo/Other  negative endocrine ROS  Renal/GU negative Renal ROS     Musculoskeletal negative musculoskeletal ROS (+)   Abdominal   Peds  Hematology negative hematology ROS (+)   Anesthesia Other Findings Day of surgery medications reviewed with the patient.  Reproductive/Obstetrics                            Anesthesia Physical Anesthesia Plan  ASA: III  Anesthesia Plan: General and Epidural   Post-op Pain Management: GA combined w/ [facility] for post-op pain   Induction:   PONV Risk Score and Plan: 3 and Ondansetron, Dexamethasone and Scopolamine patch - Pre-op  Airway Management Planned: Oral ETT  Additional  Equipment:   Intra-op Plan:   Post-operative Plan: Possible Post-op intubation/ventilation  Informed Consent: I have reviewed the patients History and Physical, chart, labs and discussed the procedure including the risks, benefits and alternatives for the proposed anesthesia with the patient or authorized representative who has  indicated his/her understanding and acceptance.   Dental advisory given  Plan Discussed with: Anesthesiologist, CRNA and Surgeon  Anesthesia Plan Comments: (ERAS protocol)      Anesthesia Quick Evaluation

## 2018-01-22 NOTE — Op Note (Signed)
[facility] Patient Name: [name] Procedure Date: 01/22/2018 MRN: [id number] Attending MD: Milus Banister , MD Date of Birth: [birth year] CSN: [id number] Age: 84 Admit Type: Outpatient Procedure:                Upper GI endoscopy Indications:              Dysphagia, Odynophagia, Heartburn Providers:                Milus Banister, MD, Zenon Mayo, RN, Charolette Child, Technician, Enrigue Catena, CRNA Referring MD:             Stark Klein, MD Medicines:                Monitored Anesthesia Care Complications:            No immediate complications. Estimated blood loss:                            None. Estimated Blood Loss:     Estimated blood loss: none. Procedure:                Pre-Anesthesia Assessment:                           - Prior to the procedure, a History and Physical                            was performed, and patient medications and                            allergies were reviewed. The patient's tolerance of                            previous anesthesia was also reviewed. The risks                            and benefits of the procedure and the sedation                            options and risks were discussed with the patient.                            All questions were answered, and informed consent                            was obtained. Prior Anticoagulants: The patient has                            taken no previous anticoagulant or antiplatelet                            agents. ASA Grade Assessment: II - A patient with  mild systemic disease. After reviewing the risks                            and benefits, the patient was deemed in                            satisfactory condition to undergo the procedure.                           After obtaining informed consent, the endoscope was                            passed under direct vision. Throughout the    procedure, the patient's blood pressure, pulse, and                            oxygen saturations were monitored continuously. The                            [id number]-[id number] ([id number]) scope was introduced through the                            mouth, and advanced to jejunum via                            gastrojejunostomy. The upper GI endoscopy was                            accomplished without difficulty. The patient                            tolerated the procedure well. Scope In: Scope Out: Findings:      Ulcerative esophagitis (LA Grade D), with what appears to be sites of       regenerating/healing mucosa.      Normal appearing gastro-jejunostomy s/p recent distal gastrectomy for       gastric adenocarcinoma. Impression:               - Severe acid, GERD releated damage to the                            esophagus.                           - No specimens collected. Moderate Sedation:      N/A- Per Anesthesia Care Recommendation:           - Return to hospital room for possible d/c today.                           - Continue twice daily PPI                           - Start H2 blocker (such as ranitidine 150mg  pill)  at bedtime nightly.                           - Reflux precautions (sitting upright whenever                            awake). Procedure Code(s):        --- Professional ---                           [phone], Esophagogastroduodenoscopy, flexible,                            transoral; diagnostic, including collection of                            specimen(s) by brushing or washing, when performed                            (separate procedure) Diagnosis Code(s):        --- Professional ---                           K21.0, Gastro-esophageal reflux disease with                            esophagitis                           R13.10, Dysphagia, unspecified                           R12, Heartburn CPT copyright 2016 [employer].  All rights reserved. The codes documented in this report are preliminary and upon coder review may  be revised to meet current compliance requirements. Milus Banister, MD 01/22/2018 12:31:20 PM This report has been signed electronically. Number of Addenda: 0

## 2018-01-22 NOTE — Progress Notes (Signed)
Assessment unchanged. [facility] arrived to transport pt to [facility]. Discharged via stretcher accompanied by EMS paramedics x 2. Packet, prepared by SW, given to paramedics to present at facility upon arrival. Wife gathered all personal things and carried them to [place] earlier today.

## 2018-01-22 NOTE — Progress Notes (Signed)
Patient ID: [name]., male   DOB: 1933-04-01, 84 y.o.   MRN: [id number] 23 Days Post-Op   Subjective: Pt was refusing EGD, but is now accepting.  He continues to tolerate tube feeds and continues to have occasional spit up episodes.  He continues to refuse anything orally, although he says he is thirsty.    Objective: Vital signs in last 24 hours: Temp:  [97.9 F (36.6 C)-98.7 F (37.1 C)] 97.9 F (36.6 C) (03/28 0420) Pulse Rate:  [76-87] 80 (03/28 0420) Resp:  [17-18] 18 (03/28 0420) BP: (119-128)/(56-74) 119/56 (03/28 0420) SpO2:  [99 %-100 %] 99 % (03/28 0420) Weight:  [78.9 kg (173 lb 15.1 oz)] 78.9 kg (173 lb 15.1 oz) (03/28 0420) Last BM Date: 01/19/18  Intake/Output from previous day: 03/27 0701 - 03/28 0700 In: 3200 [P.O.:90; NG/GT:1750] Out: 2100 [Urine:2100] Intake/Output this shift: No intake/output data recorded.  General appearance: Sleeping, arousable.  GI: normal findings: soft, non-tender and Nondistended.  J-tube site unremarkable. Tube is not taped up.    Lab Results:  Recent Labs    01/20/18 0541  WBC 5.5  HGB 10.4*  HCT 33.3*  PLT 308   BMET Recent Labs    01/20/18 0541  NA 140  K 4.6  CL 108  CO2 24  GLUCOSE 109*  BUN 23*  CREATININE 0.86  CALCIUM 8.5*     Studies/Results: Ct Abdomen Pelvis W Contrast  Result Date: 01/21/2018 CLINICAL DATA:  Evaluate for abdominal infection. History of lymphoma. Status post chemotherapy. EXAM: CT ABDOMEN AND PELVIS WITH CONTRAST TECHNIQUE: Multidetector CT imaging of the abdomen and pelvis was performed using the standard protocol following bolus administration of intravenous contrast. CONTRAST:  171mL ISOVUE-300 IOPAMIDOL (ISOVUE-300) INJECTION 61% COMPARISON:  12/16/2017. FINDINGS: Lower chest: Scar versus subsegmental atelectasis identified within the lung bases. Bilateral areas of bronchiectasis also noted. Hepatobiliary: No focal liver abnormality is seen. No gallstones, gallbladder wall  thickening, or biliary dilatation. Pancreas: Unremarkable. No pancreatic ductal dilatation or surrounding inflammatory changes. Spleen: Normal in size without focal abnormality. Adrenals/Urinary Tract: The adrenal glands appear normal. No kidney mass or hydronephrosis. Urinary bladder is normal. Stomach/Bowel: Status post partial gastrectomy and gastrojejunostomy. There is no abnormal dilatation of the bowel loops. Enteric contrast material is identified within the mid and distal small bowel loops. No extravasation of contrast material identified. A percutaneous jejunostomy tube is identified. No complications associated with the catheter. Vascular/Lymphatic: Aortic atherosclerosis. No aneurysm. No upper abdominal adenopathy. No pelvic or inguinal adenopathy. Reproductive: Prostate is unremarkable. Other: There is no free fluid or fluid collections identified. Musculoskeletal: There is degenerative disc disease identified within the lumbar spine. No suspicious bone lesions. IMPRESSION: 1. Postoperative changes compatible with distal gastrectomy and jejunostomy tube placement. No complicating features identified. 2. No fluid collections identified to suggest abscess. 3.  Aortic Atherosclerosis (ICD10-I70.0). Electronically Signed   By: Kerby Moors M.D.   On: 01/21/2018 12:10    Anti-infectives: Anti-infectives (From admission, onward)   Start     Dose/Rate Route Frequency Ordered Stop   01/21/18 1800  fluconazole (DIFLUCAN) 40 MG/ML suspension 100 mg     100 mg Per Tube Daily 01/21/18 1457 01/31/18 0959   01/05/18 1000  vancomycin (VANCOCIN) IVPB 1000 mg/200 mL premix  Status:  Discontinued     1,000 mg 200 mL/hr over 60 Minutes Intravenous Every 24 hours 01/04/18 0956 01/05/18 1319   01/04/18 1030  vancomycin (VANCOCIN) 1,500 mg in sodium chloride 0.9 % 500 mL IVPB  1,500 mg 250 mL/hr over 120 Minutes Intravenous  Once 01/04/18 0956 01/04/18 1314   01/03/18 0200  piperacillin-tazobactam (ZOSYN)  IVPB 3.375 g  Status:  Discontinued     3.375 g 12.5 mL/hr over 240 Minutes Intravenous Every 8 hours 01/03/18 0112 01/11/18 1002   12/30/17 1714  ceFAZolin (ANCEF) 2-4 GM/100ML-% IVPB    Note to Pharmacy:  Otelia Sergeant   : cabinet override      12/30/17 1714 12/31/17 0529   12/30/17 1600  ceFAZolin (ANCEF) IVPB 2g/100 mL premix     2 g 200 mL/hr over 30 Minutes Intravenous Every 8 hours 12/30/17 1356 12/30/17 1747   12/30/17 0545  ceFAZolin (ANCEF) 2-4 GM/100ML-% IVPB    Note to Pharmacy:  Waldron Session   : cabinet override      12/30/17 0545 12/30/17 0756   12/30/17 0542  ceFAZolin (ANCEF) IVPB 2g/100 mL premix     2 g 200 mL/hr over 30 Minutes Intravenous On call to O.R. 12/30/17 0542 12/30/17 0826      Assessment/Plan: s/p Procedure(s): DIAGNOSTIC LAPAROSCOPY WITH OPEN DISTAL GASTRECTOMY AND PLACEMENT JEJUNOSTOMY FEEDING TUBE Postoperative pneumonia and VDRF, resolved Goal feeds, delayed gastric emptying.  Bile reflux esophagitis vs candidal esophagitis   Postop GI bleed last week.  Likely staple line bleed.  Anticoagulation held.  Stable since transfusion x 1 week.  CT negative.      EGD today to evaluate esophagus.    Discharge later today or tomorrow depending on findings.     LOS: 23 days    Stark Klein 01/22/2018

## 2018-01-22 NOTE — H&P (View-Only) (Signed)
Patient ID: Jacob Bowens., male   DOB: 03-21-1933, 84 y.o.   MRN: [id number] 23 Days Post-Op   Subjective: Pt was refusing EGD, but is now accepting.  He continues to tolerate tube feeds and continues to have occasional spit up episodes.  He continues to refuse anything orally, although he says he is thirsty.    Objective: Vital signs in last 24 hours: Temp:  [97.9 F (36.6 C)-98.7 F (37.1 C)] 97.9 F (36.6 C) (03/28 0420) Pulse Rate:  [76-87] 80 (03/28 0420) Resp:  [17-18] 18 (03/28 0420) BP: (119-128)/(56-74) 119/56 (03/28 0420) SpO2:  [99 %-100 %] 99 % (03/28 0420) Weight:  [78.9 kg (173 lb 15.1 oz)] 78.9 kg (173 lb 15.1 oz) (03/28 0420) Last BM Date: 01/19/18  Intake/Output from previous day: 03/27 0701 - 03/28 0700 In: 3200 [P.O.:90; NG/GT:1750] Out: 2100 [Urine:2100] Intake/Output this shift: No intake/output data recorded.  General appearance: Sleeping, arousable.  GI: normal findings: soft, non-tender and Nondistended.  J-tube site unremarkable. Tube is not taped up.    Lab Results:  Recent Labs    01/20/18 0541  WBC 5.5  HGB 10.4*  HCT 33.3*  PLT 308   BMET Recent Labs    01/20/18 0541  NA 140  K 4.6  CL 108  CO2 24  GLUCOSE 109*  BUN 23*  CREATININE 0.86  CALCIUM 8.5*     Studies/Results: Ct Abdomen Pelvis W Contrast  Result Date: 01/21/2018 CLINICAL DATA:  Evaluate for abdominal infection. History of lymphoma. Status post chemotherapy. EXAM: CT ABDOMEN AND PELVIS WITH CONTRAST TECHNIQUE: Multidetector CT imaging of the abdomen and pelvis was performed using the standard protocol following bolus administration of intravenous contrast. CONTRAST:  151mL ISOVUE-300 IOPAMIDOL (ISOVUE-300) INJECTION 61% COMPARISON:  12/16/2017. FINDINGS: Lower chest: Scar versus subsegmental atelectasis identified within the lung bases. Bilateral areas of bronchiectasis also noted. Hepatobiliary: No focal liver abnormality is seen. No gallstones, gallbladder wall  thickening, or biliary dilatation. Pancreas: Unremarkable. No pancreatic ductal dilatation or surrounding inflammatory changes. Spleen: Normal in size without focal abnormality. Adrenals/Urinary Tract: The adrenal glands appear normal. No kidney mass or hydronephrosis. Urinary bladder is normal. Stomach/Bowel: Status post partial gastrectomy and gastrojejunostomy. There is no abnormal dilatation of the bowel loops. Enteric contrast material is identified within the mid and distal small bowel loops. No extravasation of contrast material identified. A percutaneous jejunostomy tube is identified. No complications associated with the catheter. Vascular/Lymphatic: Aortic atherosclerosis. No aneurysm. No upper abdominal adenopathy. No pelvic or inguinal adenopathy. Reproductive: Prostate is unremarkable. Other: There is no free fluid or fluid collections identified. Musculoskeletal: There is degenerative disc disease identified within the lumbar spine. No suspicious bone lesions. IMPRESSION: 1. Postoperative changes compatible with distal gastrectomy and jejunostomy tube placement. No complicating features identified. 2. No fluid collections identified to suggest abscess. 3.  Aortic Atherosclerosis (ICD10-I70.0). Electronically Signed   By: Kerby Moors M.D.   On: 01/21/2018 12:10    Anti-infectives: Anti-infectives (From admission, onward)   Start     Dose/Rate Route Frequency Ordered Stop   01/21/18 1800  fluconazole (DIFLUCAN) 40 MG/ML suspension 100 mg     100 mg Per Tube Daily 01/21/18 1457 01/31/18 0959   01/05/18 1000  vancomycin (VANCOCIN) IVPB 1000 mg/200 mL premix  Status:  Discontinued     1,000 mg 200 mL/hr over 60 Minutes Intravenous Every 24 hours 01/04/18 0956 01/05/18 1319   01/04/18 1030  vancomycin (VANCOCIN) 1,500 mg in sodium chloride 0.9 % 500 mL IVPB  1,500 mg 250 mL/hr over 120 Minutes Intravenous  Once 01/04/18 0956 01/04/18 1314   01/03/18 0200  piperacillin-tazobactam (ZOSYN)  IVPB 3.375 g  Status:  Discontinued     3.375 g 12.5 mL/hr over 240 Minutes Intravenous Every 8 hours 01/03/18 0112 01/11/18 1002   12/30/17 1714  ceFAZolin (ANCEF) 2-4 GM/100ML-% IVPB    Note to Pharmacy:  Otelia Sergeant   : cabinet override      12/30/17 1714 12/31/17 0529   12/30/17 1600  ceFAZolin (ANCEF) IVPB 2g/100 mL premix     2 g 200 mL/hr over 30 Minutes Intravenous Every 8 hours 12/30/17 1356 12/30/17 1747   12/30/17 0545  ceFAZolin (ANCEF) 2-4 GM/100ML-% IVPB    Note to Pharmacy:  Waldron Session   : cabinet override      12/30/17 0545 12/30/17 0756   12/30/17 0542  ceFAZolin (ANCEF) IVPB 2g/100 mL premix     2 g 200 mL/hr over 30 Minutes Intravenous On call to O.R. 12/30/17 0542 12/30/17 0826      Assessment/Plan: s/p Procedure(s): DIAGNOSTIC LAPAROSCOPY WITH OPEN DISTAL GASTRECTOMY AND PLACEMENT JEJUNOSTOMY FEEDING TUBE Postoperative pneumonia and VDRF, resolved Goal feeds, delayed gastric emptying.  Bile reflux esophagitis vs candidal esophagitis   Postop GI bleed last week.  Likely staple line bleed.  Anticoagulation held.  Stable since transfusion x 1 week.  CT negative.      EGD today to evaluate esophagus.    Discharge later today or tomorrow depending on findings.     LOS: 23 days    Stark Klein 01/22/2018

## 2018-01-22 NOTE — Anesthesia Procedure Notes (Signed)
Procedure Name: MAC Date/Time: 01/22/2018 12:07 PM Performed by: Lissa Morales, CRNA Pre-anesthesia Checklist: Patient identified, Emergency Drugs available, Suction available, Patient being monitored and Timeout performed Patient Re-evaluated:Patient Re-evaluated prior to induction Oxygen Delivery Method: Nasal cannula Placement Confirmation: positive ETCO2

## 2018-01-22 NOTE — Interval H&P Note (Signed)
History and Physical Interval Note:  01/22/2018 11:30 AM  [name].  has presented today for surgery, with the diagnosis of Gastric Cancer  The various methods of treatment have been discussed with the patient and family. After consideration of risks, benefits and other options for treatment, the patient has consented to  Procedure(s): ESOPHAGOGASTRODUODENOSCOPY (EGD) WITH PROPOFOL (N/A) as a surgical intervention .  The patient's history has been reviewed, patient examined, no change in status, stable for surgery.  I have reviewed the patient's chart and labs.  Questions were answered to the patient's satisfaction.     Milus Banister

## 2018-01-22 NOTE — Progress Notes (Signed)
Elmyra Ricks, SW, calling for EMS to transport pt to [facility]. Wife aware. Pt dressed and ready. Report called to facility and given to [facility], [place], regarding pt status, recent VS, and medications given.

## 2018-01-23 ENCOUNTER — Non-Acute Institutional Stay (SKILLED_NURSING_FACILITY): Payer: Medicare Other | Admitting: Internal Medicine

## 2018-01-23 ENCOUNTER — Encounter: Payer: Self-pay | Admitting: Internal Medicine

## 2018-01-23 DIAGNOSIS — E876 Hypokalemia: Secondary | ICD-10-CM | POA: Diagnosis not present

## 2018-01-23 DIAGNOSIS — E440 Moderate protein-calorie malnutrition: Secondary | ICD-10-CM

## 2018-01-23 DIAGNOSIS — I25119 Atherosclerotic heart disease of native coronary artery with unspecified angina pectoris: Secondary | ICD-10-CM

## 2018-01-23 DIAGNOSIS — J189 Pneumonia, unspecified organism: Secondary | ICD-10-CM | POA: Diagnosis not present

## 2018-01-23 DIAGNOSIS — R5381 Other malaise: Secondary | ICD-10-CM | POA: Diagnosis not present

## 2018-01-23 DIAGNOSIS — D649 Anemia, unspecified: Secondary | ICD-10-CM | POA: Diagnosis not present

## 2018-01-23 DIAGNOSIS — C169 Malignant neoplasm of stomach, unspecified: Secondary | ICD-10-CM

## 2018-01-23 DIAGNOSIS — K209 Esophagitis, unspecified without bleeding: Secondary | ICD-10-CM

## 2018-01-23 DIAGNOSIS — E782 Mixed hyperlipidemia: Secondary | ICD-10-CM
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
## 2018-01-26 ENCOUNTER — Encounter: Payer: Self-pay | Admitting: *Deleted

## 2018-01-26 NOTE — Anesthesia Postprocedure Evaluation (Addendum)
Anesthesia Post Note  Patient: [name].  Procedure(s) Performed: ESOPHAGOGASTRODUODENOSCOPY (EGD) WITH PROPOFOL (N/A )     Patient location during evaluation: Endoscopy Anesthesia Type: MAC Level of consciousness: awake Pain management: pain level controlled Vital Signs Assessment: post-procedure vital signs reviewed and stable Cardiovascular status: stable Postop Assessment: no apparent nausea or vomiting Anesthetic complications: no    Last Vitals:  Vitals:   01/22/18 1355 01/22/18 1536  BP: 109/64 128/64  Pulse: 72 73  Resp: 16 18  Temp: 36.7 C   SpO2: 100%     Last Pain:  Vitals:   01/22/18 1355  TempSrc: Oral  PainSc:    Pain Goal: Patients Stated Pain Goal: 3 (01/22/18 0850)               Lennette Fader JR,[name]

## 2018-01-27 LAB — HEPATIC FUNCTION PANEL
ALT: 16 (ref 10–40)
AST: 32 (ref 14–40)
Alkaline Phosphatase: 68 (ref 25–125)
BILIRUBIN, TOTAL: 0.4

## 2018-01-27 LAB — CBC AND DIFFERENTIAL
HCT: 30 — AB (ref 41–53)
Hemoglobin: 9.9 — AB (ref 13.5–17.5)
Platelets: 187 (ref 150–399)
WBC: 5.2

## 2018-01-27 LAB — BASIC METABOLIC PANEL
BUN: 22 — AB (ref 4–21)
Creatinine: 1 (ref <=1.3)
GLUCOSE: 94
POTASSIUM: 5 (ref 3.4–5.3)
Sodium: 137 (ref 137–147)

## 2018-01-28 ENCOUNTER — Other Ambulatory Visit: Payer: Self-pay | Admitting: *Deleted

## 2018-02-02 ENCOUNTER — Emergency Department (HOSPITAL_COMMUNITY)
Admission: EM | Admit: 2018-02-02 | Discharge: 2018-02-02 | Disposition: A | Discharge status: Home / Self Care | Payer: Medicare Other | Attending: Emergency Medicine | Admitting: Emergency Medicine

## 2018-02-02 ENCOUNTER — Encounter (HOSPITAL_COMMUNITY): Payer: Self-pay | Admitting: Emergency Medicine

## 2018-02-02 ENCOUNTER — Emergency Department (HOSPITAL_COMMUNITY): Payer: Medicare Other

## 2018-02-02 ENCOUNTER — Other Ambulatory Visit: Payer: Self-pay

## 2018-02-02 DIAGNOSIS — Z85828 Personal history of other malignant neoplasm of skin: Secondary | ICD-10-CM | POA: Diagnosis not present

## 2018-02-02 DIAGNOSIS — Z79899 Other long term (current) drug therapy: Secondary | ICD-10-CM | POA: Diagnosis not present

## 2018-02-02 DIAGNOSIS — Z8572 Personal history of non-Hodgkin lymphomas: Secondary | ICD-10-CM | POA: Insufficient documentation

## 2018-02-02 DIAGNOSIS — I509 Heart failure, unspecified: Secondary | ICD-10-CM | POA: Insufficient documentation

## 2018-02-02 DIAGNOSIS — R1084 Generalized abdominal pain: Secondary | ICD-10-CM | POA: Insufficient documentation

## 2018-02-02 DIAGNOSIS — I110 Hypertensive heart disease with heart failure: Secondary | ICD-10-CM | POA: Insufficient documentation

## 2018-02-02 DIAGNOSIS — I2510 Atherosclerotic heart disease of native coronary artery without angina pectoris: Secondary | ICD-10-CM | POA: Diagnosis not present

## 2018-02-02 DIAGNOSIS — R109 Unspecified abdominal pain: Secondary | ICD-10-CM | POA: Diagnosis not present

## 2018-02-02 LAB — CBC WITH DIFFERENTIAL/PLATELET
BASOS ABS: 0 10*3/uL (ref 0.0–0.1)
BASOS PCT: 0 %
EOS ABS: 0.4 10*3/uL (ref 0.0–0.7)
EOS PCT: 7 %
HCT: 35.9 % — ABNORMAL LOW (ref 39.0–52.0)
Hemoglobin: 11.4 g/dL — ABNORMAL LOW (ref 13.0–17.0)
Lymphocytes Relative: 44 %
Lymphs Abs: 2.7 10*3/uL (ref 0.7–4.0)
MCH: 31.3 pg (ref 26.0–34.0)
MCHC: 31.8 g/dL (ref 30.0–36.0)
MCV: 98.6 fL (ref 78.0–100.0)
MONO ABS: 1 10*3/uL (ref 0.1–1.0)
MONOS PCT: 15 %
NEUTROS ABS: 2.1 10*3/uL (ref 1.7–7.7)
Neutrophils Relative %: 34 %
PLATELETS: 211 10*3/uL (ref 150–400)
RBC: 3.64 MIL/uL — ABNORMAL LOW (ref 4.22–5.81)
RDW: 19 % — AB (ref 11.5–15.5)
WBC: 6.2 10*3/uL (ref 4.0–10.5)

## 2018-02-02 LAB — COMPREHENSIVE METABOLIC PANEL
ALBUMIN: 3.2 g/dL — AB (ref 3.5–5.0)
ALK PHOS: 80 U/L (ref 38–126)
ALT: 16 U/L — AB (ref 17–63)
ANION GAP: 11 (ref 5–15)
AST: 19 U/L (ref 15–41)
BILIRUBIN TOTAL: 0.7 mg/dL (ref 0.3–1.2)
BUN: 20 mg/dL (ref 6–20)
CALCIUM: 8.8 mg/dL — AB (ref 8.9–10.3)
CO2: 23 mmol/L (ref 22–32)
CREATININE: 0.98 mg/dL (ref 0.61–1.24)
Chloride: 104 mmol/L (ref 101–111)
GFR calc Af Amer: >60 mL/min (ref >60)
GFR calc non Af Amer: >60 mL/min (ref >60)
GLUCOSE: 95 mg/dL (ref 65–99)
Potassium: 4 mmol/L (ref 3.5–5.1)
Sodium: 138 mmol/L (ref 135–145)
TOTAL PROTEIN: 5.6 g/dL — AB (ref 6.5–8.1)

## 2018-02-02 LAB — LIPASE, BLOOD: LIPASE: 29 U/L (ref 11–51)

## 2018-02-02 MED ORDER — IOPAMIDOL (ISOVUE-300) INJECTION 61%
INTRAVENOUS | Status: DC
Start: 2018-02-02 — End: 2018-02-02
  Filled 2018-02-02: qty 100

## 2018-02-02 MED ORDER — IOPAMIDOL (ISOVUE-300) INJECTION 61%
100.0000 mL | Freq: Once | INTRAVENOUS | Status: AC | PRN
  Administered 2018-02-02: 100 mL via INTRAVENOUS

## 2018-02-02 NOTE — ED Triage Notes (Signed)
Pt from [facility] with left sided abdominal pain around J-tube that was placed on March 5th. Patient is guarded and experiencing 10/10 pain. Pt is also diaphoretic on arrival, AO at baseline.
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
## 2018-02-02 NOTE — ED Notes (Signed)
[facility] has been called  

## 2018-02-02 NOTE — ED Notes (Signed)
Bed: WC13 Expected date:  Expected time:  Means of arrival:  Comments: EMS-pain from peg tube

## 2018-02-02 NOTE — Discharge Instructions (Addendum)
Please return for any problem. Followup with your regular providers as instructed.

## 2018-02-03 ENCOUNTER — Encounter: Payer: Self-pay | Admitting: Nurse Practitioner

## 2018-02-03 ENCOUNTER — Non-Acute Institutional Stay (SKILLED_NURSING_FACILITY): Payer: Medicare Other | Admitting: Nurse Practitioner

## 2018-02-03 DIAGNOSIS — I10 Essential (primary) hypertension: Secondary | ICD-10-CM | POA: Diagnosis not present

## 2018-02-03 DIAGNOSIS — I2510 Atherosclerotic heart disease of native coronary artery without angina pectoris: Secondary | ICD-10-CM | POA: Diagnosis not present

## 2018-02-03 DIAGNOSIS — F4321 Adjustment disorder with depressed mood: Secondary | ICD-10-CM

## 2018-02-03 DIAGNOSIS — R531 Weakness: Secondary | ICD-10-CM | POA: Insufficient documentation

## 2018-02-03 DIAGNOSIS — K2100 Gastro-esophageal reflux disease with esophagitis, without bleeding: Secondary | ICD-10-CM

## 2018-02-03 DIAGNOSIS — R112 Nausea with vomiting, unspecified: Secondary | ICD-10-CM

## 2018-02-03 DIAGNOSIS — R5383 Other fatigue: Secondary | ICD-10-CM | POA: Diagnosis not present

## 2018-02-03 NOTE — Assessment & Plan Note (Signed)
Periodic post OP nausea and vomiting required feeds to be held, will request dietitian to evaluate tube feeding, schedule Zofran 4mg  tid in addition to 8hr prn x 3 days, observe.

## 2018-02-03 NOTE — Assessment & Plan Note (Signed)
Prn NTG available, continue Crestor.

## 2018-02-03 NOTE — Assessment & Plan Note (Signed)
Multiple factorials, will request PT/OT/ST to re-evaluate the patient and treat as indicated/tolerated

## 2018-02-03 NOTE — Assessment & Plan Note (Signed)
HTN, controlled blood pressure, continue  Carvedilol 6.25mg  bid.

## 2018-02-03 NOTE — Assessment & Plan Note (Signed)
EGD showed severe esophagitis, continue Ranitidine, carafate, Omeprazole, Diflucan,

## 2018-02-03 NOTE — Assessment & Plan Note (Addendum)
fatigue, feeling exhausted all the time, complaining too much therapy activities, not sleeping well at night,  nausea/vomiting up tube feeding content,  lost about #6Ibs in the past 3 weeks. His PHQ9 score was 20. The patient stated he crying sometimes, difficulty of coping current G-tube feeding and weakness, but he stated his goal is to getting better. He denied headache, cough, chest congestion, chest pain, palpitation,, he is afebrile, no O2 desaturation. Will refer to psycho therapy for evaluation and treatment, adding Mirtazapine 7.5mg  qd for poor appetite, weight loss, depressive mood for now. Observe.
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
## 2018-02-04 LAB — FUNGUS CULTURE RESULT

## 2018-02-04 LAB — FUNGUS CULTURE WITH STAIN

## 2018-02-04 LAB — FUNGAL ORGANISM REFLEX

## 2018-02-11 ENCOUNTER — Encounter: Payer: Self-pay | Admitting: Nurse Practitioner

## 2018-02-11 ENCOUNTER — Non-Acute Institutional Stay (SKILLED_NURSING_FACILITY): Payer: Medicare Other | Admitting: Nurse Practitioner

## 2018-02-11 DIAGNOSIS — F4321 Adjustment disorder with depressed mood: Secondary | ICD-10-CM

## 2018-02-11 DIAGNOSIS — R634 Abnormal weight loss: Secondary | ICD-10-CM

## 2018-02-11 DIAGNOSIS — K219 Gastro-esophageal reflux disease without esophagitis: Secondary | ICD-10-CM

## 2018-02-11 DIAGNOSIS — R112 Nausea with vomiting, unspecified: Secondary | ICD-10-CM

## 2018-02-11 NOTE — Assessment & Plan Note (Signed)
Continue Carafate and Ranitidine.

## 2018-02-11 NOTE — Assessment & Plan Note (Signed)
persisted nausea/vomiting, s/p gastrectomy and J tube placement, Zofran 4mg  tid on 02/03/18 x 3 days with a decrease in vomiting, prn Tramadol used x11 4/3-4/15. He receives 2.0cal feeds 70cc/hr x 20hrs/day, flush 245ml water 5x/day. Mechanical soft food and thickened fluids as tolerated. Weights doen gradually from #170Ibs on admission 3 weeks ago to # 164Ib 02/10/18. Will schedule Zofran 4mg  q8h x 3 days, Reglan 5mg  ac and hs x 5 days, Folorator bid x 2 weeks. Observe.

## 2018-02-11 NOTE — Assessment & Plan Note (Addendum)
Continue to monitor weight, continue Mirtazapine.
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
## 2018-02-11 NOTE — Assessment & Plan Note (Addendum)
Positive responses, continue Mirtazapine 7.5mg  qd, will have 6:30am to 10:30am off J tube feeding, encourage the patient to participate activities. 02/12/18 psychiatric clinical nurse specialist: recommend to add Lexapro or Sertraline for depression. Will observe for s/s of depression while managing nausea, vomiting, physical strength, may consider SSRI if no better.

## 2018-02-17 LAB — BASIC METABOLIC PANEL
BUN: 29 — AB (ref 4–21)
CREATININE: 1 (ref <=1.3)
GLUCOSE: 97
Potassium: 4.7 (ref 3.4–5.3)
SODIUM: 137 (ref 137–147)

## 2018-02-17 LAB — LIPID PANEL
Cholesterol: 84 (ref 0–200)
HDL: 27 — AB (ref 35–70)
LDL CALC: 36
LDL/HDL RATIO: 3.1
Triglycerides: 131 (ref 40–160)

## 2018-02-18 LAB — ACID FAST CULTURE WITH REFLEXED SENSITIVITIES: ACID FAST CULTURE - AFSCU3: NEGATIVE

## 2018-02-19 ENCOUNTER — Other Ambulatory Visit: Payer: Self-pay | Admitting: *Deleted

## 2018-02-19 LAB — CHG ASSAY BLOOD CARBON DIOXIDE: CO2: 23

## 2018-02-19 LAB — MAGNESIUM: Magnesium: 2.2

## 2018-02-19 LAB — CHLORIDE: CHLORIDE: 104

## 2018-02-19 LAB — CALCIUM: Calcium: 8.9

## 2018-02-26 ENCOUNTER — Encounter: Payer: Self-pay | Admitting: Nurse Practitioner

## 2018-02-26 ENCOUNTER — Non-Acute Institutional Stay (SKILLED_NURSING_FACILITY): Payer: Medicare Other | Admitting: Nurse Practitioner

## 2018-02-26 ENCOUNTER — Encounter: Payer: Self-pay | Admitting: *Deleted

## 2018-02-26 DIAGNOSIS — F4321 Adjustment disorder with depressed mood: Secondary | ICD-10-CM

## 2018-02-26 DIAGNOSIS — R112 Nausea with vomiting, unspecified: Secondary | ICD-10-CM | POA: Diagnosis not present

## 2018-02-26 DIAGNOSIS — D649 Anemia, unspecified: Secondary | ICD-10-CM | POA: Insufficient documentation

## 2018-02-26 DIAGNOSIS — K219 Gastro-esophageal reflux disease without esophagitis: Secondary | ICD-10-CM | POA: Diagnosis not present

## 2018-02-26 DIAGNOSIS — I10 Essential (primary) hypertension: Secondary | ICD-10-CM

## 2018-02-26 NOTE — Assessment & Plan Note (Signed)
HTN blood pressure is controlled, continue  Carvedilol 6.25mg  bid.

## 2018-02-26 NOTE — Assessment & Plan Note (Signed)
Post op nausea/vomiting, improved after scheduled Reglan and Zofran. J tube feeds tolerated.

## 2018-02-26 NOTE — Progress Notes (Signed)
This encounter was created in error - please disregard.

## 2018-02-26 NOTE — Assessment & Plan Note (Signed)
GERD: Dr. Stark Klein 02/24/18 Protonix 40mg  bid po, Zantac 150mg  qhs po/J tube, continue Carafate.

## 2018-02-26 NOTE — Assessment & Plan Note (Signed)
situational depression, improved, continue  Mirtazapine 7.5mg .
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
## 2018-02-26 NOTE — Assessment & Plan Note (Addendum)
Anemia, last Hgb 9.9 01/27/18, update CBC, Iron, Vit B12, Folate, Ferritin. 03/04/18 wbc 4.8, Hgb 10.6, plt 211, Iron 32, ferritin 9, Vit B12 303, Folate 23.8, adding Fe 325mg  po daily, monitor CBC in one month.

## 2018-03-03 LAB — CBC AND DIFFERENTIAL
HCT: 32 — AB (ref 41–53)
Hemoglobin: 10.6 — AB (ref 13.5–17.5)
PLATELETS: 211 (ref 150–399)
WBC: 4.8

## 2018-03-03 LAB — IRON,TIBC AND FERRITIN PANEL
Ferritin: 9
Iron: 32

## 2018-03-03 LAB — VITAMIN B12: VITAMIN B 12: 303

## 2018-03-04 ENCOUNTER — Other Ambulatory Visit: Payer: Self-pay | Admitting: *Deleted

## 2018-03-04 LAB — CBC: FOLATE: 23.8

## 2018-03-05 NOTE — Progress Notes (Signed)
ERROR

## 2018-03-09 ENCOUNTER — Encounter: Payer: Self-pay | Admitting: *Deleted

## 2018-03-16 ENCOUNTER — Encounter: Payer: Self-pay | Admitting: Nurse Practitioner

## 2018-03-16 ENCOUNTER — Non-Acute Institutional Stay (SKILLED_NURSING_FACILITY): Payer: Medicare Other | Admitting: Nurse Practitioner

## 2018-03-16 DIAGNOSIS — F4321 Adjustment disorder with depressed mood: Secondary | ICD-10-CM | POA: Diagnosis not present

## 2018-03-16 DIAGNOSIS — Z934 Other artificial openings of gastrointestinal tract status: Secondary | ICD-10-CM | POA: Diagnosis not present

## 2018-03-16 DIAGNOSIS — C8318 Mantle cell lymphoma, lymph nodes of multiple sites: Secondary | ICD-10-CM

## 2018-03-16 DIAGNOSIS — K219 Gastro-esophageal reflux disease without esophagitis: Secondary | ICD-10-CM

## 2018-03-16 DIAGNOSIS — R531 Weakness: Secondary | ICD-10-CM

## 2018-03-16 DIAGNOSIS — I10 Essential (primary) hypertension: Secondary | ICD-10-CM
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
## 2018-03-17 ENCOUNTER — Encounter: Payer: Self-pay | Admitting: Nurse Practitioner

## 2018-03-17 NOTE — Assessment & Plan Note (Addendum)
His mood is stable, continue Mirtazapine 7.5mg  po qhs, continue SW support.

## 2018-03-17 NOTE — Assessment & Plan Note (Signed)
Controlled blood pressure, continue Carvedilol 6.25mg  bid, update CMP

## 2018-03-17 NOTE — Assessment & Plan Note (Signed)
Continue PT for strengthening, rolling walker for unsteady feet, expected to improve.

## 2018-03-17 NOTE — Assessment & Plan Note (Addendum)
03/13/18 surgeon: feedings 102ml/hr for 2 weeks, may stop if eating well. OT for continue ADL retraining. Home health nurse to assist and educate the patient with tube feeding and medication management.

## 2018-03-17 NOTE — Assessment & Plan Note (Addendum)
Stable, continue Protonix 40mg  bid, Ranitidine 150mg  qd. Dc Carafate-the patient desires to discontinue it, no white or yellow coating in oral cavity and tongue. Observe.

## 2018-03-17 NOTE — Assessment & Plan Note (Signed)
Stable, update CBC

## 2018-03-18 ENCOUNTER — Other Ambulatory Visit: Payer: Self-pay | Admitting: *Deleted

## 2018-03-18 LAB — COMPLETE METABOLIC PANEL WITH GFR
ALBUMIN: 3.1
Calcium: 8.5
Carbon Dioxide, Total: 28
Chloride: 107
GFR CALC NON AF AMER: 80
GLOBULIN: 1.6
PROTEIN: 4.7

## 2018-03-18 LAB — BASIC METABOLIC PANEL
BUN: 20 (ref 4–21)
Creatinine: 0.9 (ref <=1.3)
Glucose: 119
Potassium: 4.2 (ref 3.4–5.3)
SODIUM: 140 (ref 137–147)

## 2018-03-18 LAB — CBC AND DIFFERENTIAL
HEMATOCRIT: 32 — AB (ref 41–53)
Hemoglobin: 10.7 — AB (ref 13.5–17.5)
PLATELETS: 186 (ref 150–399)
WBC: 4.2

## 2018-03-18 LAB — HEPATIC FUNCTION PANEL
ALT: 10 (ref 10–40)
AST: 14 (ref 14–40)
Alkaline Phosphatase: 61 (ref 25–125)
Bilirubin, Total: 0.3

## 2018-03-20 ENCOUNTER — Telehealth: Payer: Self-pay | Admitting: Family Medicine

## 2018-03-20 DIAGNOSIS — C8590 Non-Hodgkin lymphoma, unspecified, unspecified site: Secondary | ICD-10-CM | POA: Diagnosis not present

## 2018-03-20 DIAGNOSIS — I10 Essential (primary) hypertension: Secondary | ICD-10-CM | POA: Diagnosis not present

## 2018-03-20 DIAGNOSIS — Z903 Acquired absence of stomach [part of]: Secondary | ICD-10-CM | POA: Diagnosis not present

## 2018-03-20 DIAGNOSIS — C162 Malignant neoplasm of body of stomach: Secondary | ICD-10-CM | POA: Diagnosis not present

## 2018-03-20 DIAGNOSIS — I700 Atherosclerosis of aorta: Secondary | ICD-10-CM | POA: Diagnosis not present

## 2018-03-20 DIAGNOSIS — E440 Moderate protein-calorie malnutrition: Secondary | ICD-10-CM | POA: Diagnosis not present

## 2018-03-20 DIAGNOSIS — Z434 Encounter for attention to other artificial openings of digestive tract: Secondary | ICD-10-CM | POA: Diagnosis not present

## 2018-03-20 DIAGNOSIS — I2510 Atherosclerotic heart disease of native coronary artery without angina pectoris: Secondary | ICD-10-CM | POA: Diagnosis not present

## 2018-03-20 DIAGNOSIS — M109 Gout, unspecified: Secondary | ICD-10-CM | POA: Diagnosis not present

## 2018-03-20 DIAGNOSIS — K219 Gastro-esophageal reflux disease without esophagitis: Secondary | ICD-10-CM | POA: Diagnosis not present

## 2018-03-20 NOTE — Telephone Encounter (Signed)
Copied from [place] [phone]. Topic: Quick Communication - See Telephone Encounter >> Mar 20, 2018  2:36 PM Hewitt Shorts wrote: Kathlee Nations [facility] -is calling stating that pt came home from the nursing home yesterday and [facility] is needing a verbal order that Dr. Elease Hashimoto will need to be the signing provider for skilled nursing   Best number liz [phone]

## 2018-03-20 NOTE — Telephone Encounter (Signed)
[name] is aware

## 2018-03-20 NOTE — Telephone Encounter (Signed)
Yes.  We will sign.

## 2018-03-23 DIAGNOSIS — C162 Malignant neoplasm of body of stomach: Secondary | ICD-10-CM | POA: Diagnosis not present

## 2018-03-23 DIAGNOSIS — K219 Gastro-esophageal reflux disease without esophagitis: Secondary | ICD-10-CM | POA: Diagnosis not present

## 2018-03-23 DIAGNOSIS — Z434 Encounter for attention to other artificial openings of digestive tract: Secondary | ICD-10-CM | POA: Diagnosis not present

## 2018-03-23 DIAGNOSIS — I2510 Atherosclerotic heart disease of native coronary artery without angina pectoris: Secondary | ICD-10-CM | POA: Diagnosis not present

## 2018-03-23 DIAGNOSIS — C8590 Non-Hodgkin lymphoma, unspecified, unspecified site: Secondary | ICD-10-CM | POA: Diagnosis not present

## 2018-03-23 DIAGNOSIS — E440 Moderate protein-calorie malnutrition: Secondary | ICD-10-CM | POA: Diagnosis not present

## 2018-03-25 ENCOUNTER — Telehealth: Payer: Self-pay | Admitting: Family Medicine

## 2018-03-25 DIAGNOSIS — K219 Gastro-esophageal reflux disease without esophagitis: Secondary | ICD-10-CM | POA: Diagnosis not present

## 2018-03-25 DIAGNOSIS — C8590 Non-Hodgkin lymphoma, unspecified, unspecified site: Secondary | ICD-10-CM | POA: Diagnosis not present

## 2018-03-25 DIAGNOSIS — E440 Moderate protein-calorie malnutrition: Secondary | ICD-10-CM | POA: Diagnosis not present

## 2018-03-25 DIAGNOSIS — I2510 Atherosclerotic heart disease of native coronary artery without angina pectoris: Secondary | ICD-10-CM | POA: Diagnosis not present

## 2018-03-25 DIAGNOSIS — C162 Malignant neoplasm of body of stomach: Secondary | ICD-10-CM | POA: Diagnosis not present

## 2018-03-25 DIAGNOSIS — Z434 Encounter for attention to other artificial openings of digestive tract: Secondary | ICD-10-CM | POA: Diagnosis not present

## 2018-03-25 NOTE — Telephone Encounter (Signed)
Copied from [place] [phone]. Topic: Quick Communication - See Telephone Encounter >> Mar 25, 2018 10:33 AM Cleaster Corin, NT wrote: CRM for notification. See Telephone encounter for: 03/25/18.  [facility] from [facility] calling to receive verbal orders for PT [phone] (can leave vm)  1 week 1 2 week 4

## 2018-03-25 NOTE — Telephone Encounter (Signed)
Copied from [place] [id number]. Topic: Quick Communication - See Telephone Encounter >> Mar 25, 2018  3:07 PM Bea Graff, NT wrote: CRM for notification. See Telephone encounter for: 03/25/18. Kat with OT at [facility] completed an 1 time eval for this pt and states he is doing ok and will not need OT. CB#: [phone]

## 2018-03-25 NOTE — Telephone Encounter (Signed)
Noted  

## 2018-03-25 NOTE — Telephone Encounter (Signed)
OK 

## 2018-03-25 NOTE — Telephone Encounter (Signed)
See phone note

## 2018-03-26 DIAGNOSIS — I2510 Atherosclerotic heart disease of native coronary artery without angina pectoris: Secondary | ICD-10-CM | POA: Diagnosis not present

## 2018-03-26 DIAGNOSIS — E440 Moderate protein-calorie malnutrition: Secondary | ICD-10-CM | POA: Diagnosis not present

## 2018-03-26 DIAGNOSIS — C8590 Non-Hodgkin lymphoma, unspecified, unspecified site: Secondary | ICD-10-CM | POA: Diagnosis not present

## 2018-03-26 DIAGNOSIS — Z434 Encounter for attention to other artificial openings of digestive tract: Secondary | ICD-10-CM | POA: Diagnosis not present

## 2018-03-26 DIAGNOSIS — C162 Malignant neoplasm of body of stomach: Secondary | ICD-10-CM | POA: Diagnosis not present

## 2018-03-26 DIAGNOSIS — K219 Gastro-esophageal reflux disease without esophagitis: Secondary | ICD-10-CM | POA: Diagnosis not present

## 2018-03-27 NOTE — Telephone Encounter (Signed)
I spoke with [name] and gave the verbal okay.

## 2018-03-30 DIAGNOSIS — E440 Moderate protein-calorie malnutrition: Secondary | ICD-10-CM | POA: Diagnosis not present

## 2018-03-30 DIAGNOSIS — C8590 Non-Hodgkin lymphoma, unspecified, unspecified site: Secondary | ICD-10-CM | POA: Diagnosis not present

## 2018-03-30 DIAGNOSIS — C162 Malignant neoplasm of body of stomach: Secondary | ICD-10-CM | POA: Diagnosis not present

## 2018-03-30 DIAGNOSIS — Z434 Encounter for attention to other artificial openings of digestive tract: Secondary | ICD-10-CM | POA: Diagnosis not present

## 2018-03-30 DIAGNOSIS — I2510 Atherosclerotic heart disease of native coronary artery without angina pectoris: Secondary | ICD-10-CM | POA: Diagnosis not present

## 2018-03-30 DIAGNOSIS — K219 Gastro-esophageal reflux disease without esophagitis: Secondary | ICD-10-CM | POA: Diagnosis not present

## 2018-04-01 DIAGNOSIS — I2510 Atherosclerotic heart disease of native coronary artery without angina pectoris: Secondary | ICD-10-CM | POA: Diagnosis not present

## 2018-04-01 DIAGNOSIS — K219 Gastro-esophageal reflux disease without esophagitis: Secondary | ICD-10-CM | POA: Diagnosis not present

## 2018-04-01 DIAGNOSIS — C8590 Non-Hodgkin lymphoma, unspecified, unspecified site: Secondary | ICD-10-CM | POA: Diagnosis not present

## 2018-04-01 DIAGNOSIS — E440 Moderate protein-calorie malnutrition: Secondary | ICD-10-CM | POA: Diagnosis not present

## 2018-04-01 DIAGNOSIS — C162 Malignant neoplasm of body of stomach: Secondary | ICD-10-CM | POA: Diagnosis not present

## 2018-04-01 DIAGNOSIS — Z434 Encounter for attention to other artificial openings of digestive tract: Secondary | ICD-10-CM | POA: Diagnosis not present

## 2018-04-02 DIAGNOSIS — C162 Malignant neoplasm of body of stomach: Secondary | ICD-10-CM | POA: Diagnosis not present

## 2018-04-02 DIAGNOSIS — Z434 Encounter for attention to other artificial openings of digestive tract: Secondary | ICD-10-CM | POA: Diagnosis not present

## 2018-04-02 DIAGNOSIS — E440 Moderate protein-calorie malnutrition: Secondary | ICD-10-CM | POA: Diagnosis not present

## 2018-04-02 DIAGNOSIS — I2510 Atherosclerotic heart disease of native coronary artery without angina pectoris: Secondary | ICD-10-CM | POA: Diagnosis not present

## 2018-04-02 DIAGNOSIS — K219 Gastro-esophageal reflux disease without esophagitis: Secondary | ICD-10-CM | POA: Diagnosis not present

## 2018-04-02 DIAGNOSIS — C8590 Non-Hodgkin lymphoma, unspecified, unspecified site: Secondary | ICD-10-CM | POA: Diagnosis not present

## 2018-04-03 ENCOUNTER — Ambulatory Visit (INDEPENDENT_AMBULATORY_CARE_PROVIDER_SITE_OTHER): Payer: Medicare Other | Admitting: Family Medicine

## 2018-04-03 ENCOUNTER — Encounter: Payer: Self-pay | Admitting: Family Medicine

## 2018-04-03 VITALS — BP 110/68 | HR 70 | Temp 98.3°F | Wt 176.3 lb

## 2018-04-03 DIAGNOSIS — D649 Anemia, unspecified: Secondary | ICD-10-CM | POA: Diagnosis not present

## 2018-04-03 DIAGNOSIS — I25119 Atherosclerotic heart disease of native coronary artery with unspecified angina pectoris: Secondary | ICD-10-CM

## 2018-04-03 DIAGNOSIS — Z862 Personal history of diseases of the blood and blood-forming organs and certain disorders involving the immune mechanism: Secondary | ICD-10-CM

## 2018-04-03 DIAGNOSIS — K219 Gastro-esophageal reflux disease without esophagitis: Secondary | ICD-10-CM | POA: Diagnosis not present

## 2018-04-03 DIAGNOSIS — C169 Malignant neoplasm of stomach, unspecified: Secondary | ICD-10-CM

## 2018-04-03 DIAGNOSIS — C162 Malignant neoplasm of body of stomach: Secondary | ICD-10-CM

## 2018-04-03 LAB — CBC WITH DIFFERENTIAL/PLATELET
Basophils Absolute: 0 10*3/uL (ref 0.0–0.1)
Basophils Relative: 1 % (ref 0.0–3.0)
EOS PCT: 6.7 % — AB (ref 0.0–5.0)
Eosinophils Absolute: 0.3 10*3/uL (ref 0.0–0.7)
HEMATOCRIT: 38 % — AB (ref 39.0–52.0)
Hemoglobin: 12.7 g/dL — ABNORMAL LOW (ref 13.0–17.0)
LYMPHS ABS: 2 10*3/uL (ref 0.7–4.0)
LYMPHS PCT: 43.6 % (ref 12.0–46.0)
MCHC: 33.4 g/dL (ref 30.0–36.0)
MCV: 101.6 fl — ABNORMAL HIGH (ref 78.0–100.0)
MONOS PCT: 9.1 % (ref 3.0–12.0)
Monocytes Absolute: 0.4 10*3/uL (ref 0.1–1.0)
NEUTROS ABS: 1.9 10*3/uL (ref 1.4–7.7)
NEUTROS PCT: 39.6 % — AB (ref 43.0–77.0)
Platelets: 194 10*3/uL (ref 150.0–400.0)
RBC: 3.74 Mil/uL — AB (ref 4.22–5.81)
RDW: 17.9 % — ABNORMAL HIGH (ref 11.5–15.5)
WBC: 4.7 10*3/uL (ref 4.0–10.5)

## 2018-04-03 LAB — COMPREHENSIVE METABOLIC PANEL
ALBUMIN: 3.8 g/dL (ref 3.5–5.2)
ALT: 11 U/L (ref 0–53)
AST: 17 U/L (ref 0–37)
Alkaline Phosphatase: 64 U/L (ref 39–117)
BUN: 11 mg/dL (ref 6–23)
CO2: 29 mEq/L (ref 19–32)
CREATININE: 1.01 mg/dL (ref 0.40–1.50)
Calcium: 8.9 mg/dL (ref 8.4–10.5)
Chloride: 106 mEq/L (ref 96–112)
GFR: 74.97 mL/min (ref >60.00)
GLUCOSE: 79 mg/dL (ref 70–99)
POTASSIUM: 4 meq/L (ref 3.5–5.1)
Sodium: 142 mEq/L (ref 135–145)
TOTAL PROTEIN: 5.5 g/dL — AB (ref 6.0–8.3)
Total Bilirubin: 0.5 mg/dL (ref 0.2–1.2)

## 2018-04-03 MED ORDER — MIRTAZAPINE 7.5 MG PO TABS: 7.5000 mg | ORAL_TABLET | Freq: Every day | ORAL | 1 refills | Status: DC
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
## 2018-04-06 ENCOUNTER — Encounter: Payer: Self-pay | Admitting: Hematology

## 2018-04-06 DIAGNOSIS — I2510 Atherosclerotic heart disease of native coronary artery without angina pectoris: Secondary | ICD-10-CM | POA: Diagnosis not present

## 2018-04-06 DIAGNOSIS — E440 Moderate protein-calorie malnutrition: Secondary | ICD-10-CM | POA: Diagnosis not present

## 2018-04-06 DIAGNOSIS — K219 Gastro-esophageal reflux disease without esophagitis: Secondary | ICD-10-CM | POA: Diagnosis not present

## 2018-04-06 DIAGNOSIS — C162 Malignant neoplasm of body of stomach: Secondary | ICD-10-CM | POA: Diagnosis not present

## 2018-04-06 DIAGNOSIS — C8590 Non-Hodgkin lymphoma, unspecified, unspecified site: Secondary | ICD-10-CM | POA: Diagnosis not present

## 2018-04-06 DIAGNOSIS — Z434 Encounter for attention to other artificial openings of digestive tract: Secondary | ICD-10-CM | POA: Diagnosis not present

## 2018-04-08 DIAGNOSIS — C162 Malignant neoplasm of body of stomach: Secondary | ICD-10-CM | POA: Diagnosis not present

## 2018-04-08 DIAGNOSIS — I2510 Atherosclerotic heart disease of native coronary artery without angina pectoris: Secondary | ICD-10-CM | POA: Diagnosis not present

## 2018-04-08 DIAGNOSIS — C8590 Non-Hodgkin lymphoma, unspecified, unspecified site: Secondary | ICD-10-CM | POA: Diagnosis not present

## 2018-04-08 DIAGNOSIS — Z434 Encounter for attention to other artificial openings of digestive tract: Secondary | ICD-10-CM | POA: Diagnosis not present

## 2018-04-08 DIAGNOSIS — K219 Gastro-esophageal reflux disease without esophagitis: Secondary | ICD-10-CM | POA: Diagnosis not present

## 2018-04-08 DIAGNOSIS — E440 Moderate protein-calorie malnutrition: Secondary | ICD-10-CM | POA: Diagnosis not present

## 2018-04-11 ENCOUNTER — Other Ambulatory Visit: Payer: Self-pay | Admitting: Nurse Practitioner

## 2018-04-13 ENCOUNTER — Encounter: Payer: Self-pay | Admitting: Hematology

## 2018-04-13 DIAGNOSIS — Z434 Encounter for attention to other artificial openings of digestive tract: Secondary | ICD-10-CM | POA: Diagnosis not present

## 2018-04-13 DIAGNOSIS — K219 Gastro-esophageal reflux disease without esophagitis: Secondary | ICD-10-CM | POA: Diagnosis not present

## 2018-04-13 DIAGNOSIS — C162 Malignant neoplasm of body of stomach: Secondary | ICD-10-CM | POA: Diagnosis not present

## 2018-04-13 DIAGNOSIS — I2510 Atherosclerotic heart disease of native coronary artery without angina pectoris: Secondary | ICD-10-CM | POA: Diagnosis not present

## 2018-04-13 DIAGNOSIS — E440 Moderate protein-calorie malnutrition: Secondary | ICD-10-CM | POA: Diagnosis not present

## 2018-04-13 DIAGNOSIS — C8590 Non-Hodgkin lymphoma, unspecified, unspecified site: Secondary | ICD-10-CM | POA: Diagnosis not present

## 2018-04-15 ENCOUNTER — Telehealth: Payer: Self-pay

## 2018-04-15 ENCOUNTER — Other Ambulatory Visit: Payer: Self-pay | Admitting: Nurse Practitioner

## 2018-04-15 DIAGNOSIS — E440 Moderate protein-calorie malnutrition: Secondary | ICD-10-CM | POA: Diagnosis not present

## 2018-04-15 DIAGNOSIS — C162 Malignant neoplasm of body of stomach: Secondary | ICD-10-CM | POA: Diagnosis not present

## 2018-04-15 DIAGNOSIS — C8590 Non-Hodgkin lymphoma, unspecified, unspecified site: Secondary | ICD-10-CM | POA: Diagnosis not present

## 2018-04-15 DIAGNOSIS — I2510 Atherosclerotic heart disease of native coronary artery without angina pectoris: Secondary | ICD-10-CM | POA: Diagnosis not present

## 2018-04-15 DIAGNOSIS — K219 Gastro-esophageal reflux disease without esophagitis: Secondary | ICD-10-CM | POA: Diagnosis not present

## 2018-04-15 DIAGNOSIS — Z434 Encounter for attention to other artificial openings of digestive tract: Secondary | ICD-10-CM | POA: Diagnosis not present

## 2018-04-15 NOTE — Telephone Encounter (Signed)
In response to [employer] email sent by patient's daughter, per Dr. Irene Limbo, patient to follow up in clinic with labs in 2 weeks. Scheduling message sent to get patient scheduled. Spoke to patient's daughter [name] and she verbalized understanding. Schedulers will call patient's daughter when appointment is scheduled.

## 2018-04-16 ENCOUNTER — Other Ambulatory Visit: Payer: Self-pay | Admitting: Family Medicine

## 2018-04-16 ENCOUNTER — Telehealth: Payer: Self-pay

## 2018-04-16 NOTE — Telephone Encounter (Signed)
Called patient to schedule a appointment for July . Per 6/19 sch msg. Date was okayed by [place] due avail.

## 2018-04-21 ENCOUNTER — Other Ambulatory Visit: Payer: Self-pay

## 2018-04-21 ENCOUNTER — Other Ambulatory Visit: Payer: Self-pay | Admitting: Family Medicine

## 2018-04-21 MED ORDER — CARVEDILOL 6.25 MG PO TABS: 6.2500 mg | ORAL_TABLET | Freq: Two times a day (BID) | ORAL | 1 refills | Status: DC
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
## 2018-05-04 DIAGNOSIS — C162 Malignant neoplasm of body of stomach: Secondary | ICD-10-CM | POA: Diagnosis not present

## 2018-05-04 DIAGNOSIS — E440 Moderate protein-calorie malnutrition: Secondary | ICD-10-CM | POA: Diagnosis not present

## 2018-05-05 ENCOUNTER — Inpatient Hospital Stay: Payer: Medicare Other | Attending: Hematology | Admitting: Hematology

## 2018-05-05 ENCOUNTER — Encounter: Payer: Self-pay | Admitting: Hematology

## 2018-05-05 ENCOUNTER — Inpatient Hospital Stay: Payer: Medicare Other

## 2018-05-05 ENCOUNTER — Telehealth: Payer: Self-pay

## 2018-05-05 VITALS — BP 162/73 | HR 75 | Temp 98.2°F | Resp 18 | Ht 72.0 in | Wt 179.1 lb

## 2018-05-05 DIAGNOSIS — E43 Unspecified severe protein-calorie malnutrition: Secondary | ICD-10-CM | POA: Diagnosis not present

## 2018-05-05 DIAGNOSIS — R630 Anorexia: Secondary | ICD-10-CM | POA: Insufficient documentation

## 2018-05-05 DIAGNOSIS — D649 Anemia, unspecified: Secondary | ICD-10-CM

## 2018-05-05 DIAGNOSIS — C169 Malignant neoplasm of stomach, unspecified: Secondary | ICD-10-CM | POA: Diagnosis not present

## 2018-05-05 DIAGNOSIS — K6289 Other specified diseases of anus and rectum: Secondary | ICD-10-CM | POA: Diagnosis not present

## 2018-05-05 DIAGNOSIS — H61891 Other specified disorders of right external ear: Secondary | ICD-10-CM | POA: Diagnosis not present

## 2018-05-05 DIAGNOSIS — Z931 Gastrostomy status: Secondary | ICD-10-CM | POA: Diagnosis not present

## 2018-05-05 DIAGNOSIS — C8318 Mantle cell lymphoma, lymph nodes of multiple sites: Secondary | ICD-10-CM | POA: Diagnosis not present

## 2018-05-05 LAB — CMP (CANCER CENTER ONLY)
ALBUMIN: 3.4 g/dL — AB (ref 3.5–5.0)
ALT: 13 U/L (ref 0–44)
AST: 16 U/L (ref 15–41)
Alkaline Phosphatase: 59 U/L (ref 38–126)
Anion gap: 6 (ref 5–15)
BUN: 10 mg/dL (ref 8–23)
CHLORIDE: 109 mmol/L (ref 98–111)
CO2: 26 mmol/L (ref 22–32)
CREATININE: 0.91 mg/dL (ref 0.61–1.24)
Calcium: 9 mg/dL (ref 8.9–10.3)
GFR, Est AFR Am: >60 mL/min (ref >60)
GFR, Estimated: >60 mL/min (ref >60)
GLUCOSE: 90 mg/dL (ref 70–99)
Potassium: 3.6 mmol/L (ref 3.5–5.1)
SODIUM: 141 mmol/L (ref 135–145)
Total Bilirubin: 0.7 mg/dL (ref 0.3–1.2)
Total Protein: 5.4 g/dL — ABNORMAL LOW (ref 6.5–8.1)

## 2018-05-05 LAB — CBC WITH DIFFERENTIAL/PLATELET
Basophils Absolute: 0.1 10*3/uL (ref 0.0–0.1)
Basophils Relative: 2 %
EOS ABS: 0.2 10*3/uL (ref 0.0–0.5)
EOS PCT: 5 %
HCT: 37.1 % — ABNORMAL LOW (ref 38.4–49.9)
HEMOGLOBIN: 12.2 g/dL — AB (ref 13.0–17.1)
LYMPHS ABS: 1.8 10*3/uL (ref 0.9–3.3)
Lymphocytes Relative: 40 %
MCH: 33.8 pg — ABNORMAL HIGH (ref 27.2–33.4)
MCHC: 32.9 g/dL (ref 32.0–36.0)
MCV: 102.8 fL — ABNORMAL HIGH (ref 79.3–98.0)
Monocytes Absolute: 0.7 10*3/uL (ref 0.1–0.9)
Monocytes Relative: 16 %
NEUTROS PCT: 37 %
Neutro Abs: 1.7 10*3/uL (ref 1.5–6.5)
Platelets: 147 10*3/uL (ref 140–400)
RBC: 3.61 MIL/uL — ABNORMAL LOW (ref 4.20–5.82)
RDW: 15.9 % — ABNORMAL HIGH (ref 11.0–14.6)
WBC: 4.5 10*3/uL (ref 4.0–10.3)

## 2018-05-05 LAB — RETICULOCYTES
RBC.: 3.61 MIL/uL — ABNORMAL LOW (ref 4.20–5.82)
RETIC CT PCT: 1.4 % (ref 0.8–1.8)
Retic Count, Absolute: 50.5 10*3/uL (ref 34.8–93.9)

## 2018-05-05 NOTE — Telephone Encounter (Signed)
Per 7/9 no los. F/u based on patient decision regarding Adjuvant treatment for gastric cancer

## 2018-05-07 ENCOUNTER — Encounter: Payer: Self-pay | Admitting: Hematology

## 2018-05-11 ENCOUNTER — Encounter: Payer: Self-pay | Admitting: Family Medicine

## 2018-05-11 ENCOUNTER — Ambulatory Visit (INDEPENDENT_AMBULATORY_CARE_PROVIDER_SITE_OTHER): Payer: Medicare Other | Admitting: Family Medicine

## 2018-05-11 VITALS — BP 132/86 | HR 81 | Temp 98.7°F | Resp 16 | Ht 72.0 in | Wt 174.4 lb

## 2018-05-11 DIAGNOSIS — I25119 Atherosclerotic heart disease of native coronary artery with unspecified angina pectoris: Secondary | ICD-10-CM

## 2018-05-11 DIAGNOSIS — K219 Gastro-esophageal reflux disease without esophagitis: Secondary | ICD-10-CM

## 2018-05-11 DIAGNOSIS — J309 Allergic rhinitis, unspecified: Secondary | ICD-10-CM | POA: Diagnosis not present

## 2018-05-11 DIAGNOSIS — J988 Other specified respiratory disorders: Secondary | ICD-10-CM | POA: Diagnosis not present

## 2018-05-11 DIAGNOSIS — R05 Cough: Secondary | ICD-10-CM | POA: Diagnosis not present

## 2018-05-11 DIAGNOSIS — R059 Cough, unspecified: Secondary | ICD-10-CM

## 2018-05-11 MED ORDER — FLUTICASONE PROPIONATE 50 MCG/ACT NA SUSP: 1.0000 | Freq: Two times a day (BID) | NASAL | 0 refills | Status: DC | PRN

## 2018-05-11 MED ORDER — DOXYCYCLINE HYCLATE 100 MG PO TABS: 100.0000 mg | ORAL_TABLET | Freq: Two times a day (BID) | ORAL | 0 refills | Status: DC

## 2018-05-11 MED ORDER — BENZONATATE 100 MG PO CAPS: 200.0000 mg | ORAL_CAPSULE | Freq: Two times a day (BID) | ORAL | 0 refills | Status: AC | PRN

## 2018-05-11 NOTE — Patient Instructions (Addendum)
A few things to remember from today's visit:   Cough - Plan: benzonatate (TESSALON) 100 MG capsule  Respiratory tract infection - Plan: doxycycline (VIBRA-TABS) 100 MG tablet  Allergic rhinitis, unspecified seasonality, unspecified trigger - Plan: fluticasone (FLONASE) 50 MCG/ACT nasal spray  Gastroesophageal reflux disease, esophagitis presence not specified  Plain Mucinex and nasal irrigation with saline may help with cough. No changes seen GERD medications.  GERD:  Avoid foods that make your symptoms worse, for example coffee, chocolate,pepermeint,alcohol, and greasy food. Raising the head of your bed about 6 inches may help with nocturnal symptoms.   Avoid lying down for 3 hours after eating.  Instead 3 large meals daily try small and more frequent meals during the day.   You should be evaluated immediately if bloody vomiting, bloody stools, black stools (like tar), difficulty swallowing, food gets stuck on the way down or choking when eating. Severe abdominal pain.
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
## 2018-05-16 ENCOUNTER — Other Ambulatory Visit: Payer: Self-pay | Admitting: Nurse Practitioner

## 2018-05-18 ENCOUNTER — Ambulatory Visit (INDEPENDENT_AMBULATORY_CARE_PROVIDER_SITE_OTHER): Payer: Medicare Other

## 2018-05-18 ENCOUNTER — Ambulatory Visit (INDEPENDENT_AMBULATORY_CARE_PROVIDER_SITE_OTHER): Payer: Medicare Other | Admitting: Family Medicine

## 2018-05-18 ENCOUNTER — Encounter: Payer: Self-pay | Admitting: Family Medicine

## 2018-05-18 VITALS — BP 140/70 | HR 67 | Temp 98.6°F | Wt 176.5 lb

## 2018-05-18 DIAGNOSIS — R059 Cough, unspecified: Secondary | ICD-10-CM

## 2018-05-18 DIAGNOSIS — I25119 Atherosclerotic heart disease of native coronary artery with unspecified angina pectoris: Secondary | ICD-10-CM

## 2018-05-18 DIAGNOSIS — R05 Cough: Secondary | ICD-10-CM

## 2018-05-18 NOTE — Patient Instructions (Addendum)
Follow up for any fever or increasing shortness of breath.  I would like for you you to schedule a Medicare Annual Wellness Visit (AWV).   This is a yearly appointment with our Health Coach Wynetta Fines, RN) and is designed to develop a personalized prevention plan. This is not a head to toe physical, but rather an opportunity to prevent illness based on your current health and risk factors for disease.   Visits usually last 30-60 minutes and include various screenings for hearing, vision, depression, and dementia, falls, and safety concerns. The visit also includes diet and exercise counseling and information about advance directives.   This is also an opportunity to discuss appropriate health maintenance testing such as mammography, colonoscopy, lung cancer screening, and hepatitis C testing.   The AWV is fully covered by Medicare Part B if:  . You have had Part B for over 12 months, AND . You have not had an AWV in the past 12 months .  Please don't miss out on this opportunity! Set up your appointment today!
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
## 2018-05-21 DIAGNOSIS — Z85828 Personal history of other malignant neoplasm of skin: Secondary | ICD-10-CM | POA: Diagnosis not present

## 2018-05-21 DIAGNOSIS — L570 Actinic keratosis: Secondary | ICD-10-CM | POA: Diagnosis not present

## 2018-05-21 DIAGNOSIS — C44222 Squamous cell carcinoma of skin of right ear and external auricular canal: Secondary | ICD-10-CM | POA: Diagnosis not present

## 2018-05-21 DIAGNOSIS — D485 Neoplasm of uncertain behavior of skin: Secondary | ICD-10-CM | POA: Diagnosis not present

## 2018-05-26 ENCOUNTER — Ambulatory Visit: Payer: Medicare Other | Attending: Hematology | Admitting: Physical Therapy

## 2018-05-26 DIAGNOSIS — M6281 Muscle weakness (generalized): Secondary | ICD-10-CM

## 2018-05-26 DIAGNOSIS — R293 Abnormal posture: Secondary | ICD-10-CM | POA: Diagnosis not present
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
## 2018-05-28 ENCOUNTER — Ambulatory Visit
Admission: RE | Admit: 2018-05-28 | Discharge: 2018-05-28 | Disposition: A | Discharge status: Home / Self Care | Payer: Medicare Other | Source: Ambulatory Visit | Attending: General Surgery | Admitting: General Surgery

## 2018-05-28 ENCOUNTER — Other Ambulatory Visit: Payer: Self-pay | Admitting: General Surgery

## 2018-05-28 DIAGNOSIS — Z85028 Personal history of other malignant neoplasm of stomach: Secondary | ICD-10-CM | POA: Diagnosis not present

## 2018-05-28 DIAGNOSIS — C162 Malignant neoplasm of body of stomach: Secondary | ICD-10-CM

## 2018-05-28 MED ORDER — IOHEXOL 300 MG/ML  SOLN
30.0000 mL | Freq: Once | INTRAMUSCULAR | Status: AC | PRN
  Administered 2018-05-28: 30 mL via ORAL

## 2018-05-28 NOTE — Progress Notes (Signed)
No evidence of fistula from tube site, but does look inflamed.  Keep dressing.  Add desitin to abdominal wall to protect.  Make sure he has an appointment with me in around 1 week or so.

## 2018-06-02 ENCOUNTER — Other Ambulatory Visit: Payer: Self-pay | Admitting: Family Medicine

## 2018-06-02 DIAGNOSIS — J309 Allergic rhinitis, unspecified: Secondary | ICD-10-CM

## 2018-06-04 ENCOUNTER — Telehealth: Payer: Self-pay | Admitting: Hematology

## 2018-06-04 NOTE — Telephone Encounter (Signed)
Patients daughter called today regarding order for PET scan.  No order was placed.  In basket message was sent to Dr Irene Limbo and Nurse regarding this request. I advised patient of this also and gave her the number for Central Scheduling.

## 2018-06-08 DIAGNOSIS — C162 Malignant neoplasm of body of stomach: Secondary | ICD-10-CM | POA: Diagnosis not present

## 2018-06-11 ENCOUNTER — Telehealth: Payer: Self-pay | Admitting: Hematology

## 2018-06-11 ENCOUNTER — Telehealth: Payer: Self-pay

## 2018-06-11 NOTE — Telephone Encounter (Signed)
Spoke with patient concerning appointments that was scheduled for him per 8/13 sch msg. Mailed a letterwwith a calender enclosed.

## 2018-06-11 NOTE — Telephone Encounter (Signed)
Called and spoke with patient regarding 8/8 & 8/13 staff messages from Dr Irene Limbo.  He will wait till he sees Dr Irene Limbo on 9/12 regarding PET/CT

## 2018-06-22 ENCOUNTER — Telehealth: Payer: Self-pay

## 2018-06-22 NOTE — Telephone Encounter (Signed)
Patients daughter called asking if Dr. Irene Limbo was going to order a PET scan for her father.  She said that he told our schedulers that he wanted to wait until his appointment on 9/12, however, she states that he is becoming a little more forgetful and they would like to go ahead and schedule the PET scan if that's what Dr. Irene Limbo wants.  A high priority scheduling message was sent to Dr. Irene Limbo asking if he wants the patient to have a PET.

## 2018-06-23 ENCOUNTER — Other Ambulatory Visit: Payer: Self-pay | Admitting: Hematology

## 2018-06-23 DIAGNOSIS — C162 Malignant neoplasm of body of stomach: Secondary | ICD-10-CM

## 2018-06-23 DIAGNOSIS — C8318 Mantle cell lymphoma, lymph nodes of multiple sites: Secondary | ICD-10-CM

## 2018-06-24 ENCOUNTER — Telehealth: Payer: Self-pay

## 2018-06-24 NOTE — Telephone Encounter (Signed)
-----   Message from [name], MD sent at 06/23/2018  1:29 AM EDT ----- Regarding: RE: PET SCAN As I have mentioned before .[name] We were only going to get a PET/CT if the patient/family are deciding to proceed with adjuvant chemotherapy for his gastric cancer that is going to be very difficult to tolerate. It they are seriously considering chemotherapy for gastric cancer then we will proceed with PET/CT. I shall order it. (order placed) --will need to schedule prior to clinic visit on 9/12 thx [place]  ----- Message ----- From: Purcell Nails, RN Sent: 06/22/2018   2:12 PM EDT To: Tami Lin, RN, Gautam Juleen China, MD Subject: PET SCAN                                       Dr. Irene Limbo- Do you want this patient to get a PET scan?  The daughter called again asking.  No order has been put in    -[place]

## 2018-06-24 NOTE — Telephone Encounter (Signed)
Spoke to patient's daughter [name] and made her aware that per Dr. Irene Limbo a PET/CT will only be performed if the patient/family decide to proceed with adjuvant chemotherapy for gastric cancer that is going to be very difficult to tolerate. Informed [name] that if the patient/famiy are seriously considering chemotherapy then a PET/CT can take place. Informed [name] that an order has been placed for PET/CT. [name] stated she will discuss with her father and her family and call the office back to inform Dr. Irene Limbo of what decision has been made.

## 2018-07-02 DIAGNOSIS — H2513 Age-related nuclear cataract, bilateral: Secondary | ICD-10-CM | POA: Diagnosis not present

## 2018-07-07 ENCOUNTER — Ambulatory Visit (HOSPITAL_COMMUNITY)
Admission: RE | Admit: 2018-07-07 | Discharge: 2018-07-07 | Disposition: A | Discharge status: Home / Self Care | Payer: Medicare Other | Source: Ambulatory Visit | Attending: Hematology | Admitting: Hematology

## 2018-07-07 DIAGNOSIS — C8318 Mantle cell lymphoma, lymph nodes of multiple sites: Secondary | ICD-10-CM | POA: Diagnosis not present

## 2018-07-07 DIAGNOSIS — C162 Malignant neoplasm of body of stomach: Secondary | ICD-10-CM | POA: Diagnosis not present

## 2018-07-07 DIAGNOSIS — C169 Malignant neoplasm of stomach, unspecified: Secondary | ICD-10-CM | POA: Diagnosis not present

## 2018-07-07 LAB — GLUCOSE, CAPILLARY: Glucose-Capillary: 78 mg/dL (ref 70–99)

## 2018-07-07 MED ORDER — FLUDEOXYGLUCOSE F - 18 (FDG) INJECTION
8.7800 | Freq: Once | INTRAVENOUS | Status: AC
  Administered 2018-07-07: 8.78 via INTRAVENOUS

## 2018-07-08 ENCOUNTER — Other Ambulatory Visit: Payer: Self-pay

## 2018-07-08 DIAGNOSIS — C162 Malignant neoplasm of body of stomach: Secondary | ICD-10-CM

## 2018-07-09 ENCOUNTER — Inpatient Hospital Stay: Payer: Medicare Other | Attending: Hematology | Admitting: Hematology

## 2018-07-09 ENCOUNTER — Inpatient Hospital Stay: Payer: Medicare Other

## 2018-07-09 VITALS — BP 157/78 | HR 61 | Temp 98.4°F | Resp 18 | Ht 72.0 in | Wt 185.1 lb

## 2018-07-09 DIAGNOSIS — D649 Anemia, unspecified: Secondary | ICD-10-CM | POA: Diagnosis not present

## 2018-07-09 DIAGNOSIS — E43 Unspecified severe protein-calorie malnutrition: Secondary | ICD-10-CM | POA: Diagnosis not present

## 2018-07-09 DIAGNOSIS — C169 Malignant neoplasm of stomach, unspecified: Secondary | ICD-10-CM | POA: Diagnosis not present

## 2018-07-09 DIAGNOSIS — C162 Malignant neoplasm of body of stomach: Secondary | ICD-10-CM

## 2018-07-09 DIAGNOSIS — C8318 Mantle cell lymphoma, lymph nodes of multiple sites: Secondary | ICD-10-CM | POA: Insufficient documentation

## 2018-07-09 DIAGNOSIS — R634 Abnormal weight loss: Secondary | ICD-10-CM

## 2018-07-09 LAB — CBC WITH DIFFERENTIAL (CANCER CENTER ONLY)
BASOS ABS: 0 10*3/uL (ref 0.0–0.1)
Basophils Relative: 1 %
EOS PCT: 4 %
Eosinophils Absolute: 0.2 10*3/uL (ref 0.0–0.5)
HCT: 35.7 % — ABNORMAL LOW (ref 38.4–49.9)
Hemoglobin: 11.7 g/dL — ABNORMAL LOW (ref 13.0–17.1)
LYMPHS PCT: 37 %
Lymphs Abs: 2 10*3/uL (ref 0.9–3.3)
MCH: 34.5 pg — ABNORMAL HIGH (ref 27.2–33.4)
MCHC: 32.8 g/dL (ref 32.0–36.0)
MCV: 105.3 fL — AB (ref 79.3–98.0)
MONO ABS: 0.5 10*3/uL (ref 0.1–0.9)
Monocytes Relative: 9 %
Neutro Abs: 2.7 10*3/uL (ref 1.5–6.5)
Neutrophils Relative %: 49 %
PLATELETS: 147 10*3/uL (ref 140–400)
RBC: 3.39 MIL/uL — ABNORMAL LOW (ref 4.20–5.82)
RDW: 15.8 % — AB (ref 11.0–14.6)
WBC Count: 5.5 10*3/uL (ref 4.0–10.3)

## 2018-07-09 LAB — CMP (CANCER CENTER ONLY)
ALBUMIN: 3.4 g/dL — AB (ref 3.5–5.0)
ALT: 10 U/L (ref 0–44)
AST: 15 U/L (ref 15–41)
Alkaline Phosphatase: 78 U/L (ref 38–126)
Anion gap: 8 (ref 5–15)
BILIRUBIN TOTAL: 0.6 mg/dL (ref 0.3–1.2)
BUN: 13 mg/dL (ref 8–23)
CO2: 26 mmol/L (ref 22–32)
Calcium: 8.6 mg/dL — ABNORMAL LOW (ref 8.9–10.3)
Chloride: 111 mmol/L (ref 98–111)
Creatinine: 0.96 mg/dL (ref 0.61–1.24)
GFR, Est AFR Am: >60 mL/min (ref >60)
GFR, Estimated: >60 mL/min (ref >60)
GLUCOSE: 99 mg/dL (ref 70–99)
Potassium: 3.5 mmol/L (ref 3.5–5.1)
Sodium: 145 mmol/L (ref 135–145)
TOTAL PROTEIN: 5.7 g/dL — AB (ref 6.5–8.1)

## 2018-07-09 LAB — RETICULOCYTES
RBC.: 3.39 MIL/uL — AB (ref 4.20–5.82)
Retic Count, Absolute: 47.5 10*3/uL (ref 34.8–93.9)
Retic Ct Pct: 1.4 % (ref 0.8–1.8)
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
## 2018-07-10 ENCOUNTER — Telehealth: Payer: Self-pay

## 2018-07-10 NOTE — Telephone Encounter (Signed)
Spoke with patient concerning his upcoming appointment. Per 9/12 los. Mailed a letter4 with a calender enclosed.

## 2018-07-13 ENCOUNTER — Telehealth: Payer: Self-pay

## 2018-07-13 NOTE — Telephone Encounter (Signed)
Called patient and scheduled him for a nurse visit for his flu shot on 07/16/18.  Copied from [place] [phone]. Topic: General - Other >> Jul 13, 2018  2:02 PM Alfredia Ferguson R wrote: Pt is calling in requesting a flu shot

## 2018-07-16 ENCOUNTER — Ambulatory Visit (INDEPENDENT_AMBULATORY_CARE_PROVIDER_SITE_OTHER): Payer: Medicare Other

## 2018-07-16 DIAGNOSIS — Z23 Encounter for immunization: Secondary | ICD-10-CM

## 2018-07-19 ENCOUNTER — Other Ambulatory Visit: Payer: Self-pay | Admitting: Nurse Practitioner

## 2018-07-24 ENCOUNTER — Other Ambulatory Visit: Payer: Self-pay | Admitting: Family Medicine

## 2018-07-24 NOTE — Telephone Encounter (Signed)
This is OTC- but can send in rx if needed.  One daily

## 2018-07-24 NOTE — Telephone Encounter (Signed)
Last OV 05/18/18, No future OV  Last filled by historical provider.   OK to refill? Dosage if okay?

## 2018-08-12 DIAGNOSIS — D3132 Benign neoplasm of left choroid: Secondary | ICD-10-CM | POA: Diagnosis not present

## 2018-08-12 DIAGNOSIS — H43813 Vitreous degeneration, bilateral: Secondary | ICD-10-CM | POA: Diagnosis not present

## 2018-08-12 DIAGNOSIS — H02052 Trichiasis without entropian right lower eyelid: Secondary | ICD-10-CM | POA: Diagnosis not present

## 2018-08-12 DIAGNOSIS — H2513 Age-related nuclear cataract, bilateral: Secondary | ICD-10-CM | POA: Diagnosis not present

## 2018-08-12 DIAGNOSIS — H02055 Trichiasis without entropian left lower eyelid: Secondary | ICD-10-CM | POA: Diagnosis not present

## 2018-08-12 DIAGNOSIS — H04123 Dry eye syndrome of bilateral lacrimal glands: Secondary | ICD-10-CM | POA: Diagnosis not present

## 2018-08-24 DIAGNOSIS — Z85828 Personal history of other malignant neoplasm of skin: Secondary | ICD-10-CM | POA: Diagnosis not present

## 2018-08-24 DIAGNOSIS — L570 Actinic keratosis: Secondary | ICD-10-CM | POA: Diagnosis not present

## 2018-08-24 DIAGNOSIS — L300 Nummular dermatitis: Secondary | ICD-10-CM | POA: Diagnosis not present

## 2018-08-24 DIAGNOSIS — C44329 Squamous cell carcinoma of skin of other parts of face: Secondary | ICD-10-CM | POA: Diagnosis not present

## 2018-08-24 DIAGNOSIS — D485 Neoplasm of uncertain behavior of skin: Secondary | ICD-10-CM | POA: Diagnosis not present

## 2018-08-28 HISTORY — PX: OTHER SURGICAL HISTORY: SHX169

## 2018-09-04 DIAGNOSIS — Z85828 Personal history of other malignant neoplasm of skin: Secondary | ICD-10-CM | POA: Diagnosis not present

## 2018-09-04 DIAGNOSIS — C44329 Squamous cell carcinoma of skin of other parts of face: Secondary | ICD-10-CM | POA: Diagnosis not present

## 2018-09-04 DIAGNOSIS — L7682 Other postprocedural complications of skin and subcutaneous tissue: Secondary | ICD-10-CM | POA: Diagnosis not present

## 2018-09-24 ENCOUNTER — Other Ambulatory Visit: Payer: Self-pay | Admitting: Family Medicine

## 2018-10-05 ENCOUNTER — Ambulatory Visit (INDEPENDENT_AMBULATORY_CARE_PROVIDER_SITE_OTHER): Payer: Medicare Other | Admitting: Family Medicine

## 2018-10-05 ENCOUNTER — Other Ambulatory Visit: Payer: Self-pay

## 2018-10-05 ENCOUNTER — Encounter: Payer: Self-pay | Admitting: Family Medicine

## 2018-10-05 VITALS — BP 150/88 | HR 62 | Temp 97.9°F | Ht 72.0 in | Wt 194.7 lb

## 2018-10-05 DIAGNOSIS — I25119 Atherosclerotic heart disease of native coronary artery with unspecified angina pectoris: Secondary | ICD-10-CM

## 2018-10-05 DIAGNOSIS — R350 Frequency of micturition: Secondary | ICD-10-CM

## 2018-10-05 DIAGNOSIS — R635 Abnormal weight gain: Secondary | ICD-10-CM | POA: Diagnosis not present

## 2018-10-05 DIAGNOSIS — I10 Essential (primary) hypertension: Secondary | ICD-10-CM

## 2018-10-05 LAB — GLUCOSE, POCT (MANUAL RESULT ENTRY): POC Glucose: 81 mg/dl (ref 70–99)

## 2018-10-05 MED ORDER — LISINOPRIL 5 MG PO TABS: 5.0000 mg | ORAL_TABLET | Freq: Every day | ORAL | 3 refills | Status: DC

## 2018-10-05 NOTE — Patient Instructions (Signed)
STOP THE MIRTAZEPINE  START LISINOPRIL 5 MG ONCE DAILY
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
## 2018-10-09 ENCOUNTER — Other Ambulatory Visit: Payer: Self-pay | Admitting: Family Medicine

## 2018-10-12 ENCOUNTER — Other Ambulatory Visit: Payer: Self-pay | Admitting: Internal Medicine

## 2018-10-19 ENCOUNTER — Other Ambulatory Visit: Payer: Self-pay | Admitting: Internal Medicine

## 2018-10-19 MED ORDER — ROSUVASTATIN CALCIUM 10 MG PO TABS: 10.0000 mg | ORAL_TABLET | Freq: Every day | ORAL | 0 refills | Status: DC

## 2018-10-19 NOTE — Telephone Encounter (Signed)
New Message    Medication: Crestor  Pharmacy: [facility] [address] in [place]  90 day supply   (Dot phrases not loaded)

## 2018-10-19 NOTE — Telephone Encounter (Signed)
Pt's medication was sent to pt's pharmacy as requested. Confirmation received.  °

## 2018-11-04 ENCOUNTER — Encounter: Payer: Self-pay | Admitting: Family Medicine

## 2018-11-04 ENCOUNTER — Ambulatory Visit (INDEPENDENT_AMBULATORY_CARE_PROVIDER_SITE_OTHER): Payer: Medicare Other | Admitting: Family Medicine

## 2018-11-04 ENCOUNTER — Other Ambulatory Visit: Payer: Self-pay

## 2018-11-04 VITALS — BP 140/70 | HR 55 | Temp 98.3°F | Ht 72.0 in | Wt 192.2 lb

## 2018-11-04 DIAGNOSIS — R229 Localized swelling, mass and lump, unspecified: Secondary | ICD-10-CM | POA: Diagnosis not present

## 2018-11-04 DIAGNOSIS — I10 Essential (primary) hypertension: Secondary | ICD-10-CM

## 2018-11-04 DIAGNOSIS — R1013 Epigastric pain: Secondary | ICD-10-CM

## 2018-11-04 DIAGNOSIS — E782 Mixed hyperlipidemia: Secondary | ICD-10-CM | POA: Diagnosis not present

## 2018-11-04 MED ORDER — ROSUVASTATIN CALCIUM 10 MG PO TABS: 10.0000 mg | ORAL_TABLET | Freq: Every day | ORAL | 3 refills | Status: DC
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
## 2018-11-04 NOTE — Patient Instructions (Signed)
Food Choices for Gastroesophageal Reflux Disease, Adult When you have gastroesophageal reflux disease (GERD), the foods you eat and your eating habits are very important. Choosing the right foods can help ease the discomfort of GERD. Consider working with a diet and nutrition specialist (dietitian) to help you make healthy food choices. What general guidelines should I follow?  Eating plan  Choose healthy foods low in fat, such as fruits, vegetables, whole grains, low-fat dairy products, and lean meat, fish, and poultry.  Eat frequent, small meals instead of three large meals each day. Eat your meals slowly, in a relaxed setting. Avoid bending over or lying down until 2-3 hours after eating.  Limit high-fat foods such as fatty meats or fried foods.  Limit your intake of oils, butter, and shortening to less than 8 teaspoons each day.  Avoid the following: ? Foods that cause symptoms. These may be different for different people. Keep a food diary to keep track of foods that cause symptoms. ? Alcohol. ? Drinking large amounts of liquid with meals. ? Eating meals during the 2-3 hours before bed.  Cook foods using methods other than frying. This may include baking, grilling, or broiling. Lifestyle  Maintain a healthy weight. Ask your health care provider what weight is healthy for you. If you need to lose weight, work with your health care provider to do so safely.  Exercise for at least 30 minutes on 5 or more days each week, or as told by your health care provider.  Avoid wearing clothes that fit tightly around your waist and chest.  Do not use any products that contain nicotine or tobacco, such as cigarettes and e-cigarettes. If you need help quitting, ask your health care provider.  Sleep with the head of your bed raised. Use a wedge under the mattress or blocks under the bed frame to raise the head of the bed. What foods are not recommended? The items listed may not be a complete  list. Talk with your dietitian about what dietary choices are best for you. Grains Pastries or quick breads with added fat. French toast. Vegetables Deep fried vegetables. French fries. Any vegetables prepared with added fat. Any vegetables that cause symptoms. For some people this may include tomatoes and tomato products, chili peppers, onions and garlic, and horseradish. Fruits Any fruits prepared with added fat. Any fruits that cause symptoms. For some people this may include citrus fruits, such as oranges, grapefruit, pineapple, and lemons. Meats and other protein foods High-fat meats, such as fatty beef or pork, hot dogs, ribs, ham, sausage, salami and bacon. Fried meat or protein, including fried fish and fried chicken. Nuts and nut butters. Dairy Whole milk and chocolate milk. Sour cream. Cream. Ice cream. Cream cheese. Milk shakes. Beverages Coffee and tea, with or without caffeine. Carbonated beverages. Sodas. Energy drinks. Fruit juice made with acidic fruits (such as orange or grapefruit). Tomato juice. Alcoholic drinks. Fats and oils Butter. Margarine. Shortening. Ghee. Sweets and desserts Chocolate and cocoa. Donuts. Seasoning and other foods Pepper. Peppermint and spearmint. Any condiments, herbs, or seasonings that cause symptoms. For some people, this may include curry, hot sauce, or vinegar-based salad dressings. Summary  When you have gastroesophageal reflux disease (GERD), food and lifestyle choices are very important to help ease the discomfort of GERD.  Eat frequent, small meals instead of three large meals each day. Eat your meals slowly, in a relaxed setting. Avoid bending over or lying down until 2-3 hours after eating.  Limit high-fat   foods such as fatty meat or fried foods. This information is not intended to replace advice given to you by your health care provider. Make sure you discuss any questions you have with your health care provider. Document Released:  10/14/2005 Document Revised: 10/15/2016 Document Reviewed: 10/15/2016 Elsevier Interactive Patient Education  2019 [employer].  Consider over the counter Pepcid 20 mg twice daily as needed for reflux.
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
## 2018-11-06 ENCOUNTER — Inpatient Hospital Stay: Payer: Medicare Other

## 2018-11-06 ENCOUNTER — Inpatient Hospital Stay: Payer: Medicare Other | Attending: Hematology | Admitting: Hematology

## 2018-11-06 VITALS — BP 166/71 | HR 57 | Temp 98.2°F | Resp 18 | Ht 72.0 in | Wt 192.5 lb

## 2018-11-06 DIAGNOSIS — C8318 Mantle cell lymphoma, lymph nodes of multiple sites: Secondary | ICD-10-CM | POA: Diagnosis not present

## 2018-11-06 DIAGNOSIS — I10 Essential (primary) hypertension: Secondary | ICD-10-CM

## 2018-11-06 DIAGNOSIS — C169 Malignant neoplasm of stomach, unspecified: Secondary | ICD-10-CM

## 2018-11-06 DIAGNOSIS — C162 Malignant neoplasm of body of stomach: Secondary | ICD-10-CM

## 2018-11-06 DIAGNOSIS — K219 Gastro-esophageal reflux disease without esophagitis: Secondary | ICD-10-CM | POA: Diagnosis not present

## 2018-11-06 DIAGNOSIS — D509 Iron deficiency anemia, unspecified: Secondary | ICD-10-CM

## 2018-11-06 DIAGNOSIS — E538 Deficiency of other specified B group vitamins: Secondary | ICD-10-CM

## 2018-11-06 LAB — CBC WITH DIFFERENTIAL/PLATELET
Abs Immature Granulocytes: 0.02 10*3/uL (ref 0.00–0.07)
Basophils Absolute: 0 10*3/uL (ref 0.0–0.1)
Basophils Relative: 1 %
Eosinophils Absolute: 0.3 10*3/uL (ref 0.0–0.5)
Eosinophils Relative: 4 %
HCT: 40 % (ref 39.0–52.0)
Hemoglobin: 13.5 g/dL (ref 13.0–17.0)
IMMATURE GRANULOCYTES: 0 %
Lymphocytes Relative: 43 %
Lymphs Abs: 2.7 10*3/uL (ref 0.7–4.0)
MCH: 35.2 pg — ABNORMAL HIGH (ref 26.0–34.0)
MCHC: 33.8 g/dL (ref 30.0–36.0)
MCV: 104.2 fL — AB (ref 80.0–100.0)
MONOS PCT: 10 %
Monocytes Absolute: 0.6 10*3/uL (ref 0.1–1.0)
Neutro Abs: 2.6 10*3/uL (ref 1.7–7.7)
Neutrophils Relative %: 42 %
Platelets: 140 10*3/uL — ABNORMAL LOW (ref 150–400)
RBC: 3.84 MIL/uL — ABNORMAL LOW (ref 4.22–5.81)
RDW: 13.6 % (ref 11.5–15.5)
WBC: 6.3 10*3/uL (ref 4.0–10.5)
nRBC: 0 % (ref 0.0–0.2)

## 2018-11-06 LAB — CMP (CANCER CENTER ONLY)
ALBUMIN: 3.9 g/dL (ref 3.5–5.0)
ALK PHOS: 110 U/L (ref 38–126)
ALT: 16 U/L (ref 0–44)
AST: 19 U/L (ref 15–41)
Anion gap: 7 (ref 5–15)
BILIRUBIN TOTAL: 0.8 mg/dL (ref 0.3–1.2)
BUN: 18 mg/dL (ref 8–23)
CO2: 27 mmol/L (ref 22–32)
Calcium: 9 mg/dL (ref 8.9–10.3)
Chloride: 108 mmol/L (ref 98–111)
Creatinine: 1.03 mg/dL (ref 0.61–1.24)
GFR, Est AFR Am: >60 mL/min (ref >60)
GFR, Estimated: >60 mL/min (ref >60)
GLUCOSE: 97 mg/dL (ref 70–99)
Potassium: 4.1 mmol/L (ref 3.5–5.1)
Sodium: 142 mmol/L (ref 135–145)
TOTAL PROTEIN: 6.3 g/dL — AB (ref 6.5–8.1)

## 2018-11-06 LAB — LACTATE DEHYDROGENASE: LDH: 185 U/L (ref 98–192)

## 2018-11-06 MED ORDER — ESOMEPRAZOLE MAGNESIUM 40 MG PO CPDR: 40.0000 mg | DELAYED_RELEASE_CAPSULE | Freq: Every day | ORAL | 1 refills | Status: DC

## 2018-11-06 NOTE — Patient Instructions (Signed)
Thank you for choosing [facility] to provide your oncology and hematology care.  To afford each patient quality time with our providers, please arrive 30 minutes before your scheduled appointment time.  If you arrive late for your appointment, you may be asked to reschedule.  We strive to give you quality time with our providers, and arriving late affects you and other patients whose appointments are after yours.    If you are a no show for multiple scheduled visits, you may be dismissed from the clinic at the providers discretion.     Again, thank you for choosing [facility], our hope is that these requests will decrease the amount of time that you wait before being seen by our physicians.  ______________________________________________________________________   Should you have questions after your visit to the [facility], please contact our office at [phone] between the hours of 8:30 and 4:30 p.m.    Voicemails left after 4:30p.m will not be returned until the following business day.     For prescription refill requests, please have your pharmacy contact us directly.  Please also try to allow 48 hours for prescription requests.     Please contact the scheduling department for questions regarding scheduling.  For scheduling of procedures such as PET scans, CT scans, MRI, Ultrasound, etc please contact central scheduling at [phone].     Resources For Cancer Patients and Caregivers:    Oncolink.org:  A wonderful resource for patients and healthcare providers for information regarding your disease, ways to tract your treatment, what to expect, etc.      American Cancer Society:  800-227-2345  Can help patients locate various types of support and financial assistance   Cancer Care: 1-800-813-HOPE (4673) Provides financial assistance, online support groups, medication/co-pay assistance.     [place] DSS:  336-641-3447 Where to apply  for food stamps, Medicaid, and utility assistance   Medicare Rights Center: 800-333-4114 Helps people with Medicare understand their rights and benefits, navigate the Medicare system, and secure the quality healthcare they deserve   SCAT: 336-333-6589 [place] [employer]'s shared-ride transportation service for eligible riders who have a disability that prevents them from riding the fixed route bus.     For additional information on assistance programs please contact our social worker:   Abigail Elmore:  [phone]  

## 2018-11-20 ENCOUNTER — Other Ambulatory Visit: Payer: Self-pay | Admitting: General Surgery

## 2018-11-20 ENCOUNTER — Telehealth: Payer: Self-pay | Admitting: Internal Medicine

## 2018-11-20 DIAGNOSIS — C162 Malignant neoplasm of body of stomach: Secondary | ICD-10-CM | POA: Diagnosis not present

## 2018-11-20 DIAGNOSIS — C8310 Mantle cell lymphoma, unspecified site: Secondary | ICD-10-CM | POA: Diagnosis not present

## 2018-11-20 DIAGNOSIS — R12 Heartburn: Secondary | ICD-10-CM

## 2018-11-20 MED ORDER — LISINOPRIL 10 MG PO TABS: 10.0000 mg | ORAL_TABLET | Freq: Every day | ORAL | 3 refills | Status: DC

## 2018-11-20 NOTE — Addendum Note (Signed)
Addended by: Rodman Key on: 11/20/2018 03:57 PM   Modules accepted: Orders

## 2018-11-20 NOTE — Telephone Encounter (Signed)
Msg sent from [facility]e /ON[facility]   Pt spoke to them   COncerned about hgh BP   He is on Lisinopril 5 mg   I think it is OK for him to increase lisinopril to 10 mg daily   Keep track of BP

## 2018-11-20 NOTE — Telephone Encounter (Signed)
Spoke with patient. He will increase lisinopril to 10 mg once a day and start to record BP readings at home. Will keep track and call back to our office with any concerns.  Aware that I am sending 10 mg tablet prescription to [facility] in [place].

## 2018-11-23 ENCOUNTER — Other Ambulatory Visit: Payer: Self-pay | Admitting: Family Medicine

## 2018-11-23 ENCOUNTER — Other Ambulatory Visit: Payer: Self-pay | Admitting: General Surgery

## 2018-11-24 DIAGNOSIS — C44622 Squamous cell carcinoma of skin of right upper limb, including shoulder: Secondary | ICD-10-CM | POA: Diagnosis not present

## 2018-11-24 DIAGNOSIS — L570 Actinic keratosis: Secondary | ICD-10-CM | POA: Diagnosis not present

## 2018-11-24 DIAGNOSIS — Z85828 Personal history of other malignant neoplasm of skin: Secondary | ICD-10-CM | POA: Diagnosis not present

## 2018-11-24 DIAGNOSIS — D485 Neoplasm of uncertain behavior of skin: Secondary | ICD-10-CM | POA: Diagnosis not present

## 2018-11-25 ENCOUNTER — Ambulatory Visit
Admission: RE | Admit: 2018-11-25 | Discharge: 2018-11-25 | Disposition: A | Discharge status: Home / Self Care | Payer: Medicare Other | Source: Ambulatory Visit | Attending: General Surgery | Admitting: General Surgery

## 2018-11-25 DIAGNOSIS — K219 Gastro-esophageal reflux disease without esophagitis: Secondary | ICD-10-CM | POA: Diagnosis not present

## 2018-11-25 DIAGNOSIS — R12 Heartburn: Secondary | ICD-10-CM

## 2018-11-26 NOTE — Progress Notes (Signed)
Please let patient know only one episode of reflux seen and no evidence of ulcer on this test.  I still recommend endoscopy, but we can see how he does.

## 2018-12-01 ENCOUNTER — Other Ambulatory Visit: Payer: Self-pay | Admitting: Hematology

## 2018-12-21 ENCOUNTER — Other Ambulatory Visit: Payer: Self-pay | Admitting: Hematology

## 2018-12-21 DIAGNOSIS — C162 Malignant neoplasm of body of stomach: Secondary | ICD-10-CM

## 2018-12-21 DIAGNOSIS — C8318 Mantle cell lymphoma, lymph nodes of multiple sites: Secondary | ICD-10-CM

## 2019-01-01 ENCOUNTER — Other Ambulatory Visit: Payer: Self-pay | Admitting: *Deleted

## 2019-01-01 MED ORDER — PANTOPRAZOLE SODIUM 40 MG PO TBEC: 40.0000 mg | DELAYED_RELEASE_TABLET | Freq: Two times a day (BID) | ORAL | 3 refills | Status: DC

## 2019-01-01 NOTE — Telephone Encounter (Signed)
Refill per Dr Elease Hashimoto.

## 2019-01-14 ENCOUNTER — Encounter: Payer: Self-pay | Admitting: Internal Medicine

## 2019-01-17 ENCOUNTER — Other Ambulatory Visit: Payer: Self-pay | Admitting: Family Medicine

## 2019-01-20 ENCOUNTER — Telehealth: Payer: Self-pay | Admitting: Internal Medicine

## 2019-01-20 NOTE — Telephone Encounter (Signed)
BP is not as good as it has been been in past     Otherwise feels OK  140s to 160s/  I would keep on same regimen for now.  Will follow late spring (end of may, June) if things improved from an infection standpoint

## 2019-01-21 NOTE — Telephone Encounter (Signed)
Cancelled upcoming appointment, will send to C19 r/s pool to plan follow up 

## 2019-01-25 ENCOUNTER — Ambulatory Visit: Payer: Medicare Other | Admitting: Internal Medicine

## 2019-02-15 ENCOUNTER — Telehealth: Payer: Self-pay | Admitting: Physician Assistant

## 2019-02-15 NOTE — Telephone Encounter (Signed)
Spoke with patient who confirmed all demographics.  Does not have smart phone. Will have vitals ready for visit. Patient's daughter handles all of the My Chart activity.  I asked the patient to let his daughter know that the virtual consent will be in his My Chart message center. Patient gave verbal consent.

## 2019-02-15 NOTE — Telephone Encounter (Signed)
Virtual Visit Pre-Appointment Phone Call  Steps For Call:  1. Confirm consent - "In the setting of the current Covid19 crisis, you are scheduled for a (phone or video) visit with your provider on (date) at (time).  Just as we do with many in-office visits, in order for you to participate in this visit, we must obtain consent.  If you'd like, I can send this to your mychart (if signed up) or email for you to review.  Otherwise, I can obtain your verbal consent now.  All virtual visits are billed to your insurance company just like a normal visit would be.  By agreeing to a virtual visit, we'd like you to understand that the technology does not allow for your provider to perform an examination, and thus may limit your provider's ability to fully assess your condition. If your provider identifies any concerns that need to be evaluated in person, we will make arrangements to do so.  Finally, though the technology is pretty good, we cannot assure that it will always work on either your or our end, and in the setting of a video visit, we may have to convert it to a phone-only visit.  In either situation, we cannot ensure that we have a secure connection.  Are you willing to proceed?" STAFF: Did the patient verbally acknowledge consent to telehealth visit? Document YES/NO here: YES  2. Confirm the BEST phone number to call the day of the visit by including in appointment notes  3. Give patient instructions for WebEx/MyChart download to smartphone as below or Doximity/Doxy.me if video visit (depending on what platform provider is using)  4. Advise patient to be prepared with their blood pressure, heart rate, weight, any heart rhythm information, their current medicines, and a piece of paper and pen handy for any instructions they may receive the day of their visit  5. Inform patient they will receive a phone call 15 minutes prior to their appointment time (may be from unknown caller ID) so they should be  prepared to answer  6. Confirm that appointment type is correct in Epic appointment notes (VIDEO vs PHONE)     TELEPHONE CALL NOTE  Jacob Redwine. has been deemed a candidate for a follow-up tele-health visit to limit community exposure during the Covid-19 pandemic. I spoke with the patient via phone to ensure availability of phone/video source, confirm preferred email & phone number, and discuss instructions and expectations.  I reminded Jacob Archuletta. to be prepared with any vital sign and/or heart rhythm information that could potentially be obtained via home monitoring, at the time of his visit. I reminded Jacob Vicencio. to expect a phone call at the time of his visit if his visit.  Jeanann Lewandowsky, [place] 02/15/2019 10:52 AM   INSTRUCTIONS FOR DOWNLOADING THE WEBEX APP TO SMARTPHONE  - If Apple, ask patient to go to [employer] and type in WebEx in the search bar. [employer], the blue/green circle. If Android, go to [employer] and type in [employer] in the search bar. The app is free but as with any other app downloads, their phone may require them to verify saved payment information or Apple/Android password.  - The patient does NOT have to create an account. - On the day of the visit, the assist will walk the patient through joining the meeting with the meeting number/password.  INSTRUCTIONS FOR DOWNLOADING THE MYCHART APP TO SMARTPHONE  - The patient  must first make sure to have activated MyChart and know their login information - If Apple, go to [employer] and type in MyChart in the search bar and download the app. If Android, ask patient to go to [employer] and type in [place] in the search bar and download the app. The app is free but as with any other app downloads, their phone may require them to verify saved payment information or Apple/Android password.  - The patient will need to then log into the app with their MyChart username  and password, and select [facility] as their healthcare provider to link the account. When it is time for your visit, go to the MyChart app, find appointments, and click Begin Video Visit. Be sure to Select Allow for your device to access the Microphone and Camera for your visit. You will then be connected, and your provider will be with you shortly.  **If they have any issues connecting, or need assistance please contact MyChart service desk [phone])**  **If using a computer, in order to ensure the best quality for their visit they will need to use either of the following Internet Browsers: [employer], or Google Chrome**  IF USING DOXIMITY or DOXY.ME - The patient will receive a link just prior to their visit, either by text or email (to be determined day of appointment depending on if it's doxy.me or Doximity).     FULL LENGTH CONSENT FOR TELE-HEALTH VISIT   I hereby voluntarily request, consent and authorize [facility] and its employed or contracted physicians, physician assistants, nurse practitioners or other licensed health care professionals (the Practitioner), to provide me with telemedicine health care services (the "Services") as deemed necessary by the treating Practitioner. I acknowledge and consent to receive the Services by the Practitioner via telemedicine. I understand that the telemedicine visit will involve communicating with the Practitioner through live audiovisual communication technology and the disclosure of certain medical information by electronic transmission. I acknowledge that I have been given the opportunity to request an in-person assessment or other available alternative prior to the telemedicine visit and am voluntarily participating in the telemedicine visit.  I understand that I have the right to withhold or withdraw my consent to the use of telemedicine in the course of my care at any time, without affecting my right to future care or  treatment, and that the Practitioner or I may terminate the telemedicine visit at any time. I understand that I have the right to inspect all information obtained and/or recorded in the course of the telemedicine visit and may receive copies of available information for a reasonable fee.  I understand that some of the potential risks of receiving the Services via telemedicine include:  [name] Delay or interruption in medical evaluation due to technological equipment failure or disruption; . Information transmitted may not be sufficient (e.g. poor resolution of images) to allow for appropriate medical decision making by the Practitioner; and/or  . In rare instances, security protocols could fail, causing a breach of personal health information.  Furthermore, I acknowledge that it is my responsibility to provide information about my medical history, conditions and care that is complete and accurate to the best of my ability. I acknowledge that Practitioner's advice, recommendations, and/or decision may be based on factors not within their control, such as incomplete or inaccurate data provided by me or distortions of diagnostic images or specimens that may result from electronic transmissions. I understand that the practice of medicine is  not an Chief Strategy Officer and that Practitioner makes no warranties or guarantees regarding treatment outcomes. I acknowledge that I will receive a copy of this consent concurrently upon execution via email to the email address I last provided but may also request a printed copy by calling the office of [facility].    I understand that my insurance will be billed for this visit.   I have read or had this consent read to me. . I understand the contents of this consent, which adequately explains the benefits and risks of the Services being provided via telemedicine.  . I have been provided ample opportunity to ask questions regarding this consent and the Services and have had my  questions answered to my satisfaction. . I give my informed consent for the services to be provided through the use of telemedicine in my medical care  By participating in this telemedicine visit I agree to the above.YES

## 2019-02-17 ENCOUNTER — Other Ambulatory Visit: Payer: Self-pay

## 2019-02-17 ENCOUNTER — Telehealth (INDEPENDENT_AMBULATORY_CARE_PROVIDER_SITE_OTHER): Payer: Medicare Other | Admitting: Physician Assistant

## 2019-02-17 ENCOUNTER — Encounter: Payer: Self-pay | Admitting: Physician Assistant

## 2019-02-17 VITALS — BP 164/81 | HR 62 | Ht 72.0 in | Wt 190.0 lb

## 2019-02-17 DIAGNOSIS — Z7189 Other specified counseling: Secondary | ICD-10-CM

## 2019-02-17 DIAGNOSIS — I10 Essential (primary) hypertension: Secondary | ICD-10-CM

## 2019-02-17 DIAGNOSIS — I2510 Atherosclerotic heart disease of native coronary artery without angina pectoris: Secondary | ICD-10-CM

## 2019-02-17 DIAGNOSIS — I5042 Chronic combined systolic (congestive) and diastolic (congestive) heart failure: Secondary | ICD-10-CM

## 2019-02-17 DIAGNOSIS — E782 Mixed hyperlipidemia: Secondary | ICD-10-CM

## 2019-02-17 MED ORDER — LISINOPRIL 20 MG PO TABS: 20.0000 mg | ORAL_TABLET | Freq: Every day | ORAL | 3 refills | Status: DC

## 2019-02-17 NOTE — Patient Instructions (Addendum)
Medication Instructions:  Your physician has recommended you make the following change in your medication:  1.  INCREASE the Lisinopril to 20 mg take 1 tablet daily.  You may take 2 of the Lisinopril 10 mg tablets to use them up.   If you need a refill on your cardiac medications before your next appointment, please call your pharmacy.   Lab work: None ordered  If you have labs (blood work) drawn today and your tests are completely normal, you will receive your results only by: [name] MyChart Message (if you have MyChart) OR . A paper copy in the mail If you have any lab test that is abnormal or we need to change your treatment, we will call you to review the results.  Testing/Procedures: None ordered   Follow-Up: At [facility], you and your health needs are our priority.  As part of our continuing mission to provide you with exceptional heart care, we have created designated Provider Care Teams.  These Care Teams include your primary Cardiologist (physician) and Advanced Practice Providers (APPs -  Physician Assistants and Nurse Practitioners) who all work together to provide you with the care you need, when you need it. You will need a follow up appointment in:  6 months.  Please call our office 2 months in advance to schedule this appointment.  You may see Dorris Carnes, MD or one of the following Advanced Practice Providers on your designated Care Team: Richardson Dopp, PA-C [place], [place] . Daune Perch, NP  Any Other Special Instructions Will Be Listed Below (If Applicable).  DASH Eating Plan DASH stands for "Dietary Approaches to Stop Hypertension." The DASH eating plan is a healthy eating plan that has been shown to reduce high blood pressure (hypertension). It may also reduce your risk for type 2 diabetes, heart disease, and stroke. The DASH eating plan may also help with weight loss. What are tips for following this plan?  General guidelines  Avoid eating more than 2,300 mg  (milligrams) of salt (sodium) a day. If you have hypertension, you may need to reduce your sodium intake to 2,000 mg a day.  Limit alcohol intake to no more than 1 drink a day for nonpregnant women and 2 drinks a day for men. One drink equals 12 oz of beer, 5 oz of wine, or 1 oz of hard liquor.  Work with your health care provider to maintain a healthy body weight or to lose weight. Ask what an ideal weight is for you.  Get at least 30 minutes of exercise that causes your heart to beat faster (aerobic exercise) most days of the week. Activities may include walking, swimming, or biking.  Work with your health care provider or diet and nutrition specialist (dietitian) to adjust your eating plan to your individual calorie needs. Reading food labels   Check food labels for the amount of sodium per serving. Choose foods with less than 5 percent of the Daily Value of sodium. Generally, foods with less than 300 mg of sodium per serving fit into this eating plan.  To find whole grains, look for the word "whole" as the first word in the ingredient list. Shopping  Buy products labeled as "low-sodium" or "no salt added."  Buy fresh foods. Avoid canned foods and premade or frozen meals. Cooking  Avoid adding salt when cooking. Use salt-free seasonings or herbs instead of table salt or sea salt. Check with your health care provider or pharmacist before using salt substitutes.  Do  not fry foods. Cook foods using healthy methods such as baking, boiling, grilling, and broiling instead.  Cook with heart-healthy oils, such as olive, canola, soybean, or sunflower oil. Meal planning  Eat a balanced diet that includes: ? 5 or more servings of fruits and vegetables each day. At each meal, try to fill half of your plate with fruits and vegetables. ? Up to 6-8 servings of whole grains each day. ? Less than 6 oz of lean meat, poultry, or fish each day. A 3-oz serving of meat is about the same size as a deck  of cards. One egg equals 1 oz. ? 2 servings of low-fat dairy each day. ? A serving of nuts, seeds, or beans 5 times each week. ? Heart-healthy fats. Healthy fats called Omega-3 fatty acids are found in foods such as flaxseeds and coldwater fish, like sardines, salmon, and mackerel.  Limit how much you eat of the following: ? Canned or prepackaged foods. ? Food that is high in trans fat, such as fried foods. ? Food that is high in saturated fat, such as fatty meat. ? Sweets, desserts, sugary drinks, and other foods with added sugar. ? Full-fat dairy products.  Do not salt foods before eating.  Try to eat at least 2 vegetarian meals each week.  Eat more home-cooked food and less restaurant, buffet, and fast food.  When eating at a restaurant, ask that your food be prepared with less salt or no salt, if possible. What foods are recommended? The items listed may not be a complete list. Talk with your dietitian about what dietary choices are best for you. Grains Whole-grain or whole-wheat bread. Whole-grain or whole-wheat pasta. Brown rice. [place]. Bulgur. Whole-grain and low-sodium cereals. Pita bread. Low-fat, low-sodium crackers. Whole-wheat flour tortillas. Vegetables Fresh or frozen vegetables (raw, steamed, roasted, or grilled). Low-sodium or reduced-sodium tomato and vegetable juice. Low-sodium or reduced-sodium tomato sauce and tomato paste. Low-sodium or reduced-sodium canned vegetables. Fruits All fresh, dried, or frozen fruit. Canned fruit in natural juice (without added sugar). Meat and other protein foods Skinless chicken or [place]. Ground chicken or [place]. Pork with fat trimmed off. Fish and seafood. Egg whites. Dried beans, peas, or lentils. Unsalted nuts, nut butters, and seeds. Unsalted canned beans. Lean cuts of beef with fat trimmed off. Low-sodium, lean deli meat. Dairy Low-fat (1%) or fat-free (skim) milk. Fat-free, low-fat, or reduced-fat cheeses. Nonfat,  low-sodium ricotta or cottage cheese. Low-fat or nonfat yogurt. Low-fat, low-sodium cheese. Fats and oils Soft margarine without trans fats. Vegetable oil. Low-fat, reduced-fat, or light mayonnaise and salad dressings (reduced-sodium). Canola, safflower, olive, soybean, and sunflower oils. Avocado. Seasoning and other foods Herbs. Spices. Seasoning mixes without salt. Unsalted popcorn and pretzels. Fat-free sweets. What foods are not recommended? The items listed may not be a complete list. Talk with your dietitian about what dietary choices are best for you. Grains Baked goods made with fat, such as croissants, muffins, or some breads. Dry pasta or rice meal packs. Vegetables Creamed or fried vegetables. Vegetables in a cheese sauce. Regular canned vegetables (not low-sodium or reduced-sodium). Regular canned tomato sauce and paste (not low-sodium or reduced-sodium). Regular tomato and vegetable juice (not low-sodium or reduced-sodium). Angie Fava. Olives. Fruits Canned fruit in a light or heavy syrup. Fried fruit. Fruit in cream or butter sauce. Meat and other protein foods Fatty cuts of meat. Ribs. Fried meat. Berniece Salines. Sausage. Bologna and other processed lunch meats. Salami. Fatback. Hotdogs. Bratwurst. Salted nuts and seeds. Canned beans with added  salt. Canned or smoked fish. Whole eggs or egg yolks. Chicken or [place] with skin. Dairy Whole or 2% milk, cream, and half-and-half. Whole or full-fat cream cheese. Whole-fat or sweetened yogurt. Full-fat cheese. Nondairy creamers. Whipped toppings. Processed cheese and cheese spreads. Fats and oils Butter. Stick margarine. Lard. Shortening. Ghee. Bacon fat. Tropical oils, such as coconut, palm kernel, or palm oil. Seasoning and other foods Salted popcorn and pretzels. Onion salt, garlic salt, seasoned salt, table salt, and sea salt. Worcestershire sauce. Tartar sauce. Barbecue sauce. Teriyaki sauce. Soy sauce, including reduced-sodium. Steak sauce.  Canned and packaged gravies. Fish sauce. Oyster sauce. Cocktail sauce. Horseradish that you find on the shelf. Ketchup. Mustard. Meat flavorings and tenderizers. Bouillon cubes. Hot sauce and Tabasco sauce. Premade or packaged marinades. Premade or packaged taco seasonings. Relishes. Regular salad dressings. Where to find more information:  National Heart, Lung, and [place]: https://wilson-eaton.com/  American Heart Association: www.heart.org Summary  The DASH eating plan is a healthy eating plan that has been shown to reduce high blood pressure (hypertension). It may also reduce your risk for type 2 diabetes, heart disease, and stroke.  With the DASH eating plan, you should limit salt (sodium) intake to 2,300 mg a day. If you have hypertension, you may need to reduce your sodium intake to 1,500 mg a day.  When on the DASH eating plan, aim to eat more fresh fruits and vegetables, whole grains, lean proteins, low-fat dairy, and heart-healthy fats.  Work with your health care provider or diet and nutrition specialist (dietitian) to adjust your eating plan to your individual calorie needs. This information is not intended to replace advice given to you by your health care provider. Make sure you discuss any questions you have with your health care provider. Document Released: 10/03/2011 Document Revised: 10/07/2016 Document Reviewed: 10/07/2016 Elsevier Interactive Patient Education  2019 [employer].
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
## 2019-02-22 NOTE — Progress Notes (Signed)
Am anxious about starting aspirin.   GI malignancy in past that was removed.   Do we know if he has any more chance from bleeding from this or elswhere in GI tract?

## 2019-03-01 ENCOUNTER — Ambulatory Visit (HOSPITAL_COMMUNITY)
Admission: RE | Admit: 2019-03-01 | Discharge: 2019-03-01 | Disposition: A | Discharge status: Home / Self Care | Payer: Medicare Other | Source: Ambulatory Visit | Attending: Hematology | Admitting: Hematology

## 2019-03-01 ENCOUNTER — Encounter (HOSPITAL_COMMUNITY): Payer: Self-pay

## 2019-03-01 ENCOUNTER — Inpatient Hospital Stay: Payer: Medicare Other | Attending: Hematology

## 2019-03-01 ENCOUNTER — Ambulatory Visit (HOSPITAL_COMMUNITY): Admission: RE | Admit: 2019-03-01 | Payer: Medicare Other | Source: Ambulatory Visit

## 2019-03-01 ENCOUNTER — Ambulatory Visit (HOSPITAL_COMMUNITY): Payer: Medicare Other

## 2019-03-01 ENCOUNTER — Other Ambulatory Visit: Payer: Self-pay

## 2019-03-01 ENCOUNTER — Other Ambulatory Visit: Payer: Medicare Other

## 2019-03-01 DIAGNOSIS — E440 Moderate protein-calorie malnutrition: Secondary | ICD-10-CM | POA: Diagnosis not present

## 2019-03-01 DIAGNOSIS — C8318 Mantle cell lymphoma, lymph nodes of multiple sites: Secondary | ICD-10-CM | POA: Insufficient documentation

## 2019-03-01 DIAGNOSIS — C162 Malignant neoplasm of body of stomach: Secondary | ICD-10-CM | POA: Insufficient documentation

## 2019-03-01 DIAGNOSIS — E538 Deficiency of other specified B group vitamins: Secondary | ICD-10-CM

## 2019-03-01 DIAGNOSIS — C169 Malignant neoplasm of stomach, unspecified: Secondary | ICD-10-CM | POA: Insufficient documentation

## 2019-03-01 DIAGNOSIS — D509 Iron deficiency anemia, unspecified: Secondary | ICD-10-CM

## 2019-03-01 LAB — CMP (CANCER CENTER ONLY)
ALT: 14 U/L (ref 0–44)
AST: 16 U/L (ref 15–41)
Albumin: 3.7 g/dL (ref 3.5–5.0)
Alkaline Phosphatase: 98 U/L (ref 38–126)
Anion gap: 8 (ref 5–15)
BUN: 14 mg/dL (ref 8–23)
CO2: 26 mmol/L (ref 22–32)
Calcium: 8.5 mg/dL — ABNORMAL LOW (ref 8.9–10.3)
Chloride: 109 mmol/L (ref 98–111)
Creatinine: 1.14 mg/dL (ref 0.61–1.24)
GFR, Est AFR Am: >60 mL/min (ref >60)
GFR, Estimated: 59 mL/min — ABNORMAL LOW (ref >60)
Glucose, Bld: 89 mg/dL (ref 70–99)
Potassium: 4 mmol/L (ref 3.5–5.1)
Sodium: 143 mmol/L (ref 135–145)
Total Bilirubin: 0.7 mg/dL (ref 0.3–1.2)
Total Protein: 6.2 g/dL — ABNORMAL LOW (ref 6.5–8.1)

## 2019-03-01 LAB — CBC WITH DIFFERENTIAL/PLATELET
Abs Immature Granulocytes: 0 10*3/uL (ref 0.00–0.07)
Basophils Absolute: 0.1 10*3/uL (ref 0.0–0.1)
Basophils Relative: 1 %
Eosinophils Absolute: 0.2 10*3/uL (ref 0.0–0.5)
Eosinophils Relative: 4 %
HCT: 41.1 % (ref 39.0–52.0)
Hemoglobin: 13.6 g/dL (ref 13.0–17.0)
Immature Granulocytes: 0 %
Lymphocytes Relative: 51 %
Lymphs Abs: 3 10*3/uL (ref 0.7–4.0)
MCH: 34.3 pg — ABNORMAL HIGH (ref 26.0–34.0)
MCHC: 33.1 g/dL (ref 30.0–36.0)
MCV: 103.5 fL — ABNORMAL HIGH (ref 80.0–100.0)
Monocytes Absolute: 0.5 10*3/uL (ref 0.1–1.0)
Monocytes Relative: 8 %
Neutro Abs: 2.1 10*3/uL (ref 1.7–7.7)
Neutrophils Relative %: 36 %
Platelets: 143 10*3/uL — ABNORMAL LOW (ref 150–400)
RBC: 3.97 MIL/uL — ABNORMAL LOW (ref 4.22–5.81)
RDW: 14 % (ref 11.5–15.5)
WBC: 5.8 10*3/uL (ref 4.0–10.5)
nRBC: 0 % (ref 0.0–0.2)

## 2019-03-01 LAB — IRON AND TIBC
Iron: 126 ug/dL (ref 42–163)
Saturation Ratios: 53 % (ref 20–55)
TIBC: 239 ug/dL (ref 202–409)
UIBC: 113 ug/dL — ABNORMAL LOW (ref 117–376)

## 2019-03-01 LAB — LACTATE DEHYDROGENASE: LDH: 187 U/L (ref 98–192)

## 2019-03-01 LAB — VITAMIN B12: Vitamin B-12: 761 pg/mL (ref 180–914)

## 2019-03-01 LAB — FERRITIN: Ferritin: 51 ng/mL (ref 24–336)

## 2019-03-01 MED ORDER — IOHEXOL 300 MG/ML  SOLN
100.0000 mL | Freq: Once | INTRAMUSCULAR | Status: AC | PRN
  Administered 2019-03-01: 100 mL via INTRAVENOUS

## 2019-03-01 MED ORDER — SODIUM CHLORIDE (PF) 0.9 % IJ SOLN
INTRAMUSCULAR | Status: AC
  Filled 2019-03-01: qty 50

## 2019-03-04 ENCOUNTER — Telehealth: Payer: Self-pay | Admitting: *Deleted

## 2019-03-04 ENCOUNTER — Encounter: Payer: Self-pay | Admitting: Hematology

## 2019-03-04 NOTE — Telephone Encounter (Signed)
Patient's daughter [name] sent msg on MyChart regarding current restrictions on visitors.  Attempted to contact her [phone]. LVM stating that [facility] currently limiting admission to patients and staff to reduce exposure at this time due to [place] 19. Informed that father can be given summary of visit as requested. Also informed her that wife and/or daughter can be called during patient appt so as to be present virtually and participate in appt as appropriate. Asked that she contact Dr. Grier Mitts office for further questions or concerns @ [phone].
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
## 2019-03-08 ENCOUNTER — Inpatient Hospital Stay (HOSPITAL_BASED_OUTPATIENT_CLINIC_OR_DEPARTMENT_OTHER): Payer: Medicare Other | Admitting: Hematology

## 2019-03-08 ENCOUNTER — Telehealth: Payer: Self-pay | Admitting: Hematology

## 2019-03-08 ENCOUNTER — Other Ambulatory Visit: Payer: Medicare Other

## 2019-03-08 ENCOUNTER — Other Ambulatory Visit: Payer: Self-pay

## 2019-03-08 ENCOUNTER — Encounter: Payer: Self-pay | Admitting: Hematology

## 2019-03-08 VITALS — BP 173/71 | HR 68 | Temp 99.7°F | Resp 17 | Ht 72.0 in | Wt 193.4 lb

## 2019-03-08 DIAGNOSIS — I2510 Atherosclerotic heart disease of native coronary artery without angina pectoris: Secondary | ICD-10-CM

## 2019-03-08 DIAGNOSIS — C8318 Mantle cell lymphoma, lymph nodes of multiple sites: Secondary | ICD-10-CM | POA: Diagnosis not present

## 2019-03-08 DIAGNOSIS — C169 Malignant neoplasm of stomach, unspecified: Secondary | ICD-10-CM

## 2019-03-08 DIAGNOSIS — C162 Malignant neoplasm of body of stomach: Secondary | ICD-10-CM

## 2019-03-08 DIAGNOSIS — E440 Moderate protein-calorie malnutrition: Secondary | ICD-10-CM

## 2019-03-08 NOTE — Telephone Encounter (Signed)
Scheduled appt per 5/11 los. ° °A calendar will be mailed out. °

## 2019-03-09 IMAGING — CT CT HEAD W/O CM
3 series · 16 of 47 positions shown, 19 images · non-contrast
Comparison: 09/10/2017

CLINICAL DATA: Altered level of consciousness and confusion.

EXAM:
CT HEAD WITHOUT CONTRAST
TECHNIQUE: Contiguous axial images were obtained from the base of the skull
through the vertex without intravenous contrast.

[Series 2: head wo · axial · 0.48mm/px · z∈[-168,-28]mm · 10 of 34 slices shown, 13 images]
[im 3/34  brain]
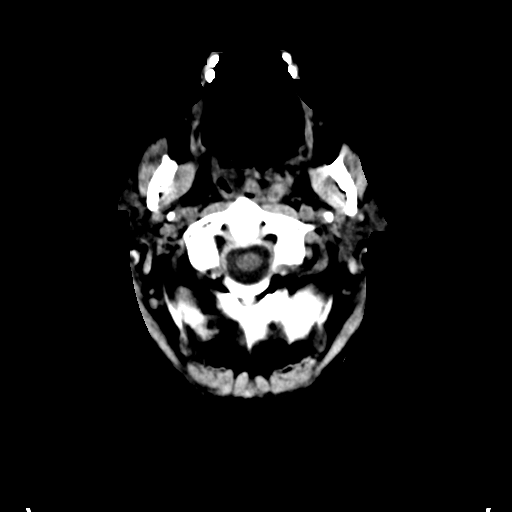
[im 3/34  bone]
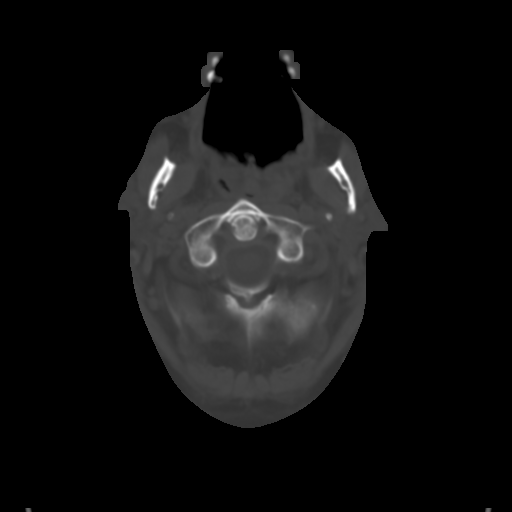
[im 6/34  brain]
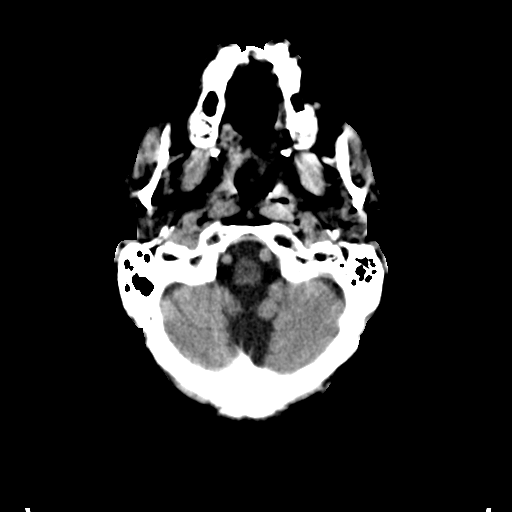
[im 10/34  brain]
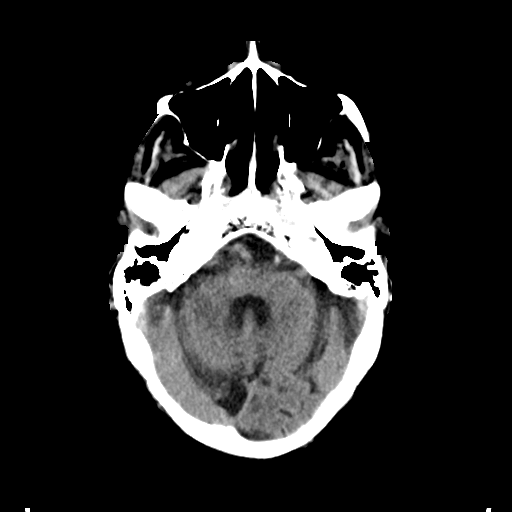
[im 12/34  brain]
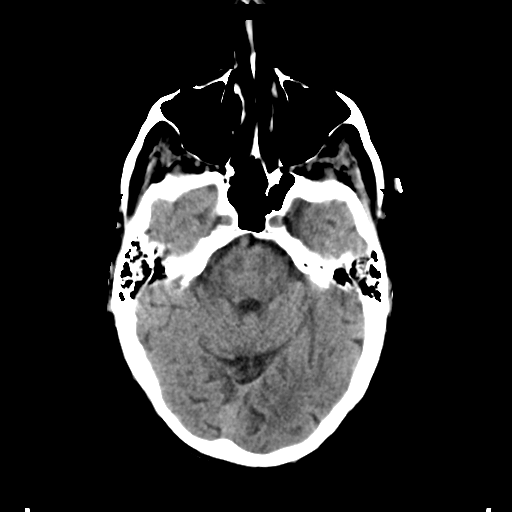
[im 15/34  brain]
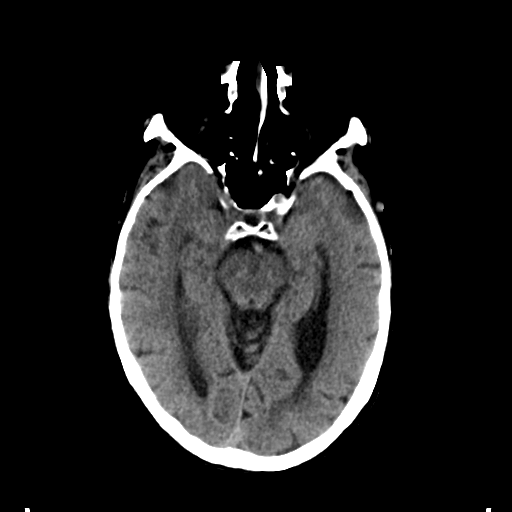
[im 15/34  bone]
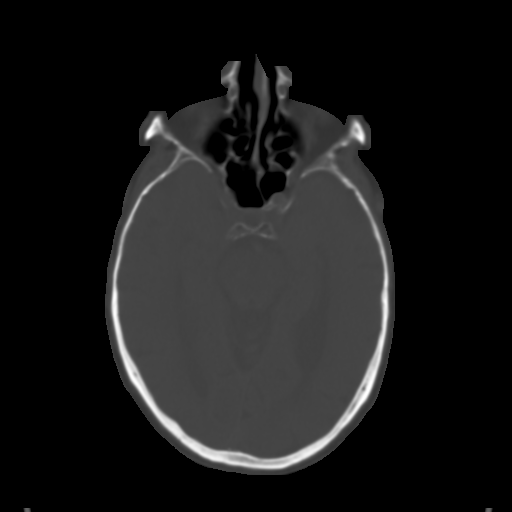
[im 19/34  brain]
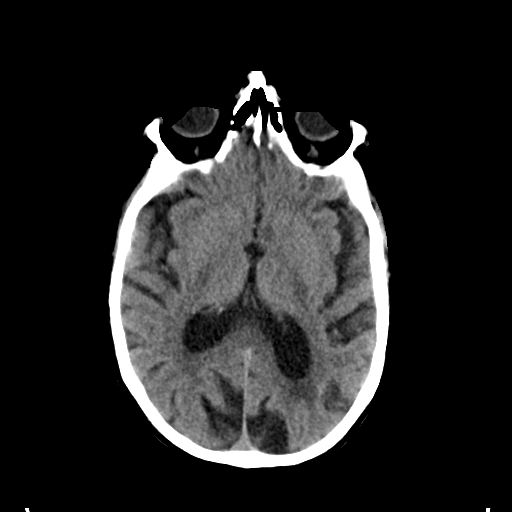
[im 22/34  brain]
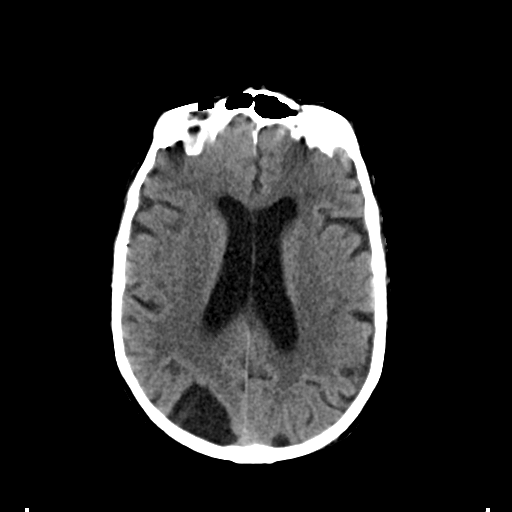
[im 26/34  brain]
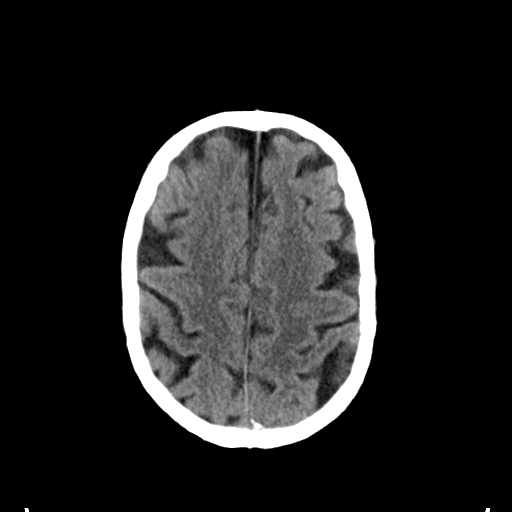
[im 28/34  brain]
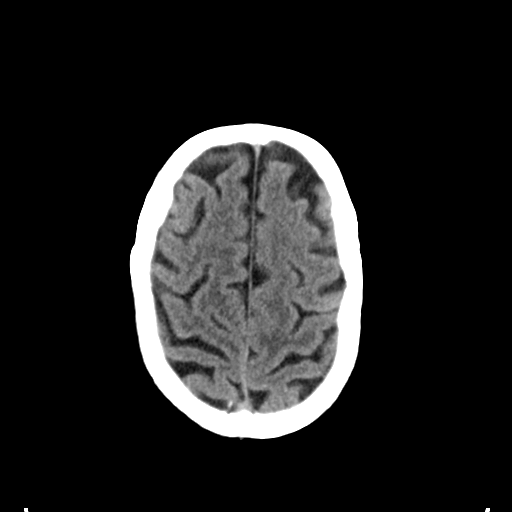
[im 28/34  bone]
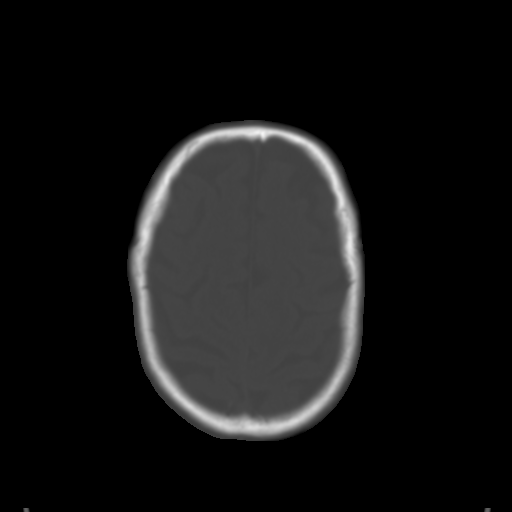
[im 31/34  brain]
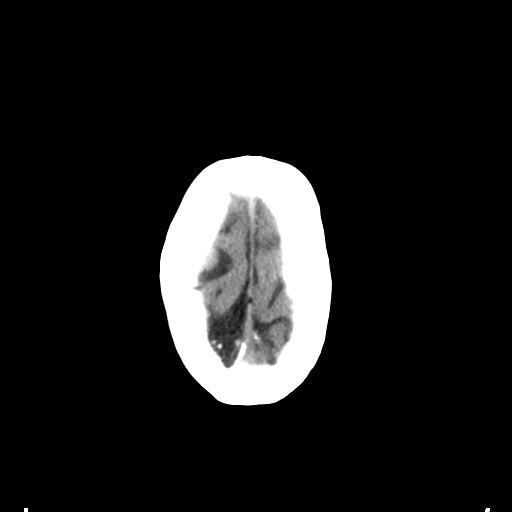

[Series 4: coronal soft tissue · coronal · 0.36mm/px · 3 of 85 slices shown]
[im 29/85  brain]
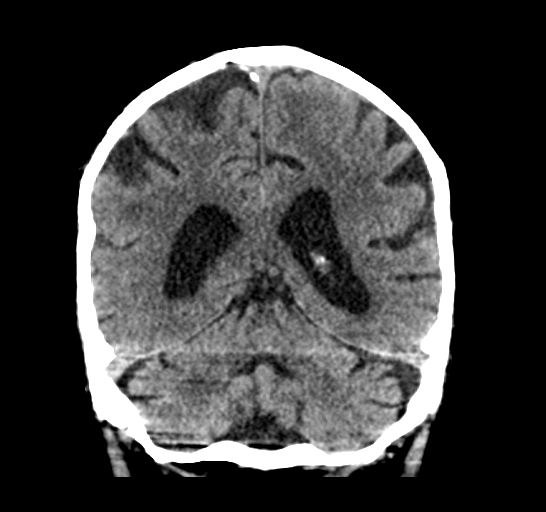
[im 38/85  brain]
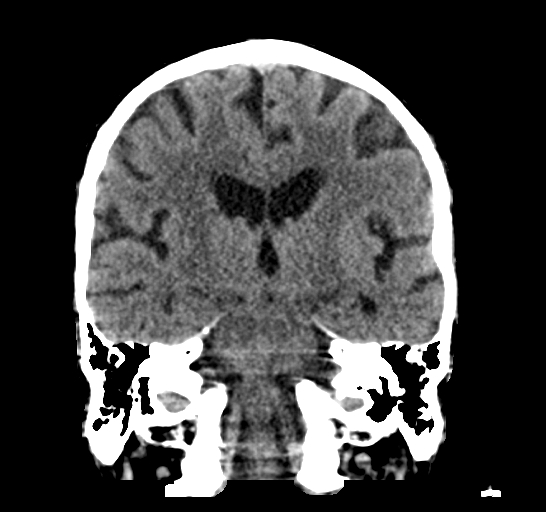
[im 47/85  brain]
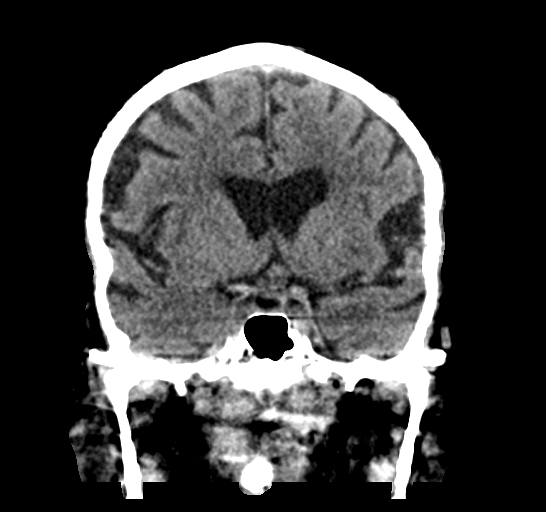

[Series 5: sagittal soft tissue · sagittal · 0.39mm/px · 3 of 64 slices shown]
[im 22/64  brain]
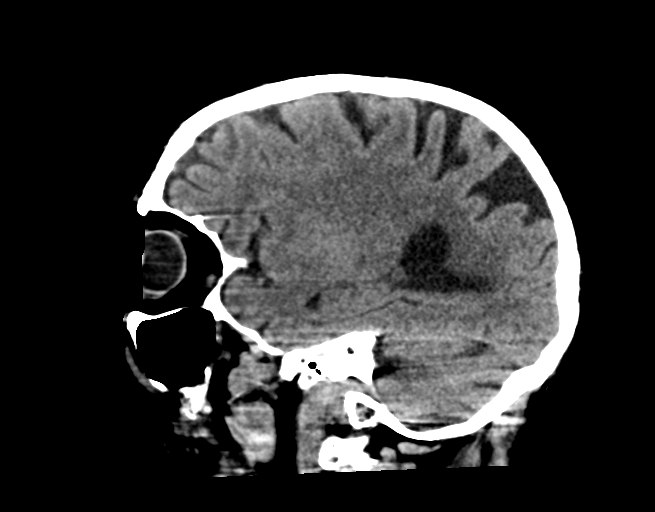
[im 32/64  brain]
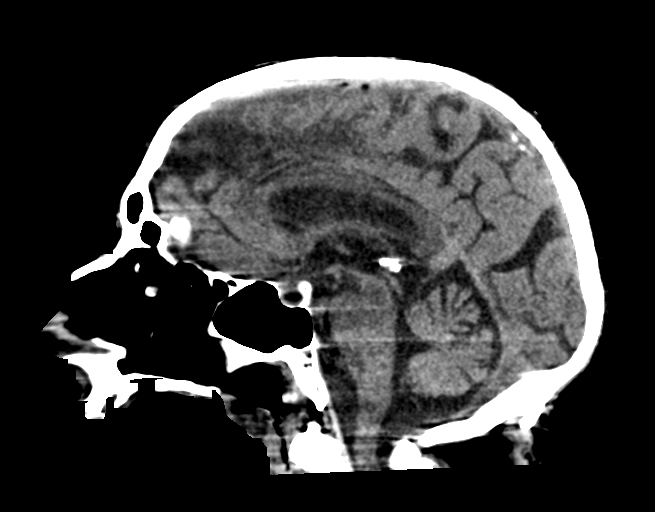
[im 43/64  brain]
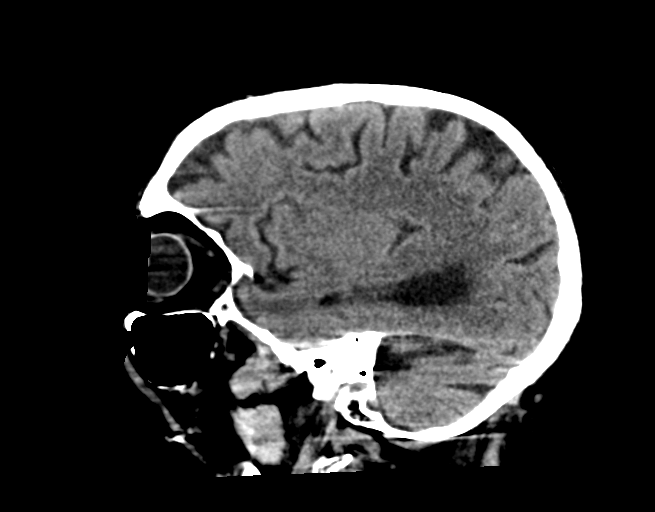

[16 of 47 positions shown; findings below may reference images not displayed]

FINDINGS: Brain: Generalized atrophy. Chronic small-vessel ischemic changes
throughout the white matter. No sign of acute infarction, mass
lesion, hemorrhage, hydrocephalus or extra-axial collection.

Vascular: No abnormal vascular finding.

Skull: Normal

Sinuses/Orbits: Clear/normal

Other: None
IMPRESSION: No acute finding. Chronic atrophy and small-vessel ischemic changes
of the brain.

## 2019-03-10 IMAGING — DX DG CHEST 1V PORT
1 series · 1 of 1 positions shown · non-contrast
Comparison: Chest radiograph performed 01/02/2018

CLINICAL DATA: Central line placement.

EXAM:
PORTABLE CHEST 1 VIEW

[chest ap]
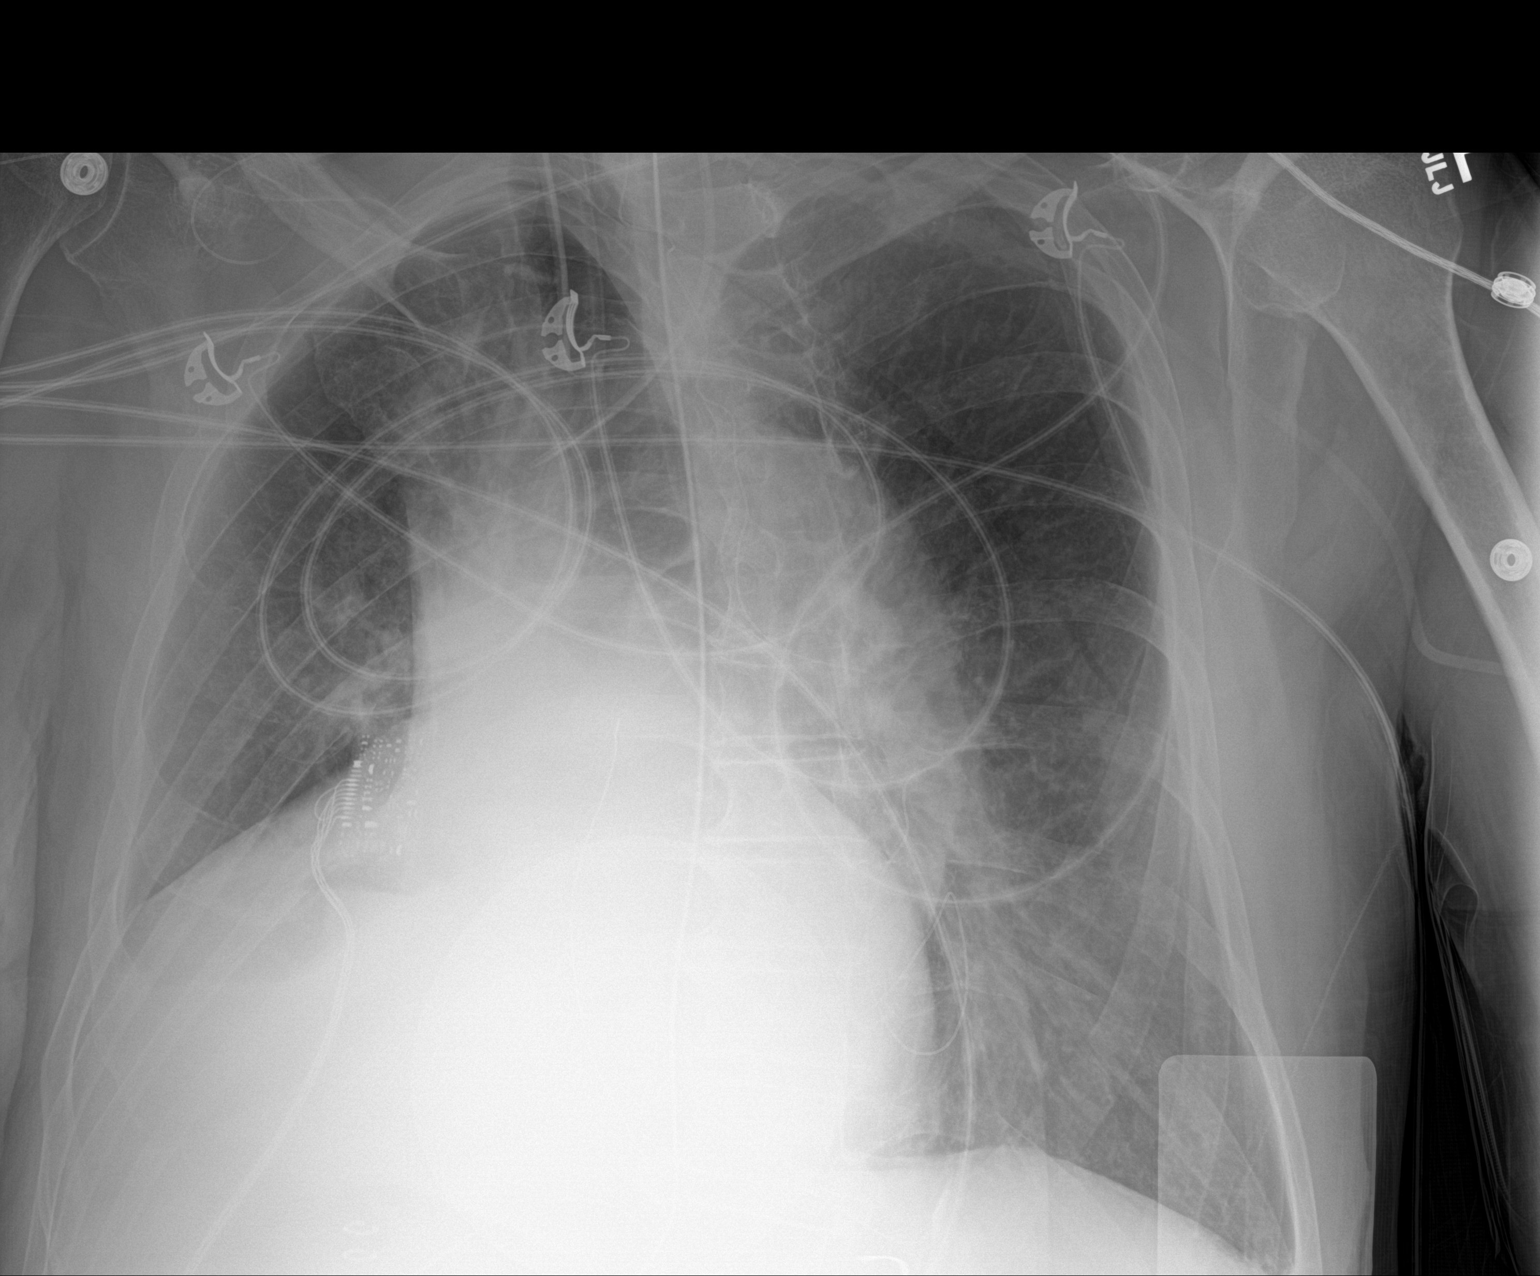

[1 of 1 positions shown; findings below may reference images not displayed]

FINDINGS: The patient's endotracheal tube is seen ending 4-5 cm above the
carina. The balloon of the endotracheal tube may be slightly
overinflated.

The left IJ line is noted ending about the proximal SVC. An enteric
tube is noted extending below the diaphragm.

There is right-sided volume loss, with apparent rightward shift of
the mediastinum. A small right pleural effusion is suspected. The
left lung appears clear. No pneumothorax is seen.

The cardiomediastinal silhouette is borderline normal in size.
External pacing pads are seen. No acute osseous abnormalities are
identified.
IMPRESSION: 1. Left IJ line noted ending about the proximal SVC.
2. Endotracheal tube seen ending 4-5 cm above the carina. The
balloon of the endotracheal tube may be slightly overinflated.
3. Right-sided volume loss, with apparent rightward shift of the
mediastinum. Small right pleural effusion noted.

## 2019-03-10 IMAGING — DX DG CHEST 1V PORT
1 series · 1 of 1 positions shown · non-contrast
Comparison: Chest x-ray from earlier same day. Chest x-ray dated
01/02/2018.

CLINICAL DATA: S/p right lobe bronchoscopy

EXAM:
PORTABLE CHEST 1 VIEW

[chest ap]
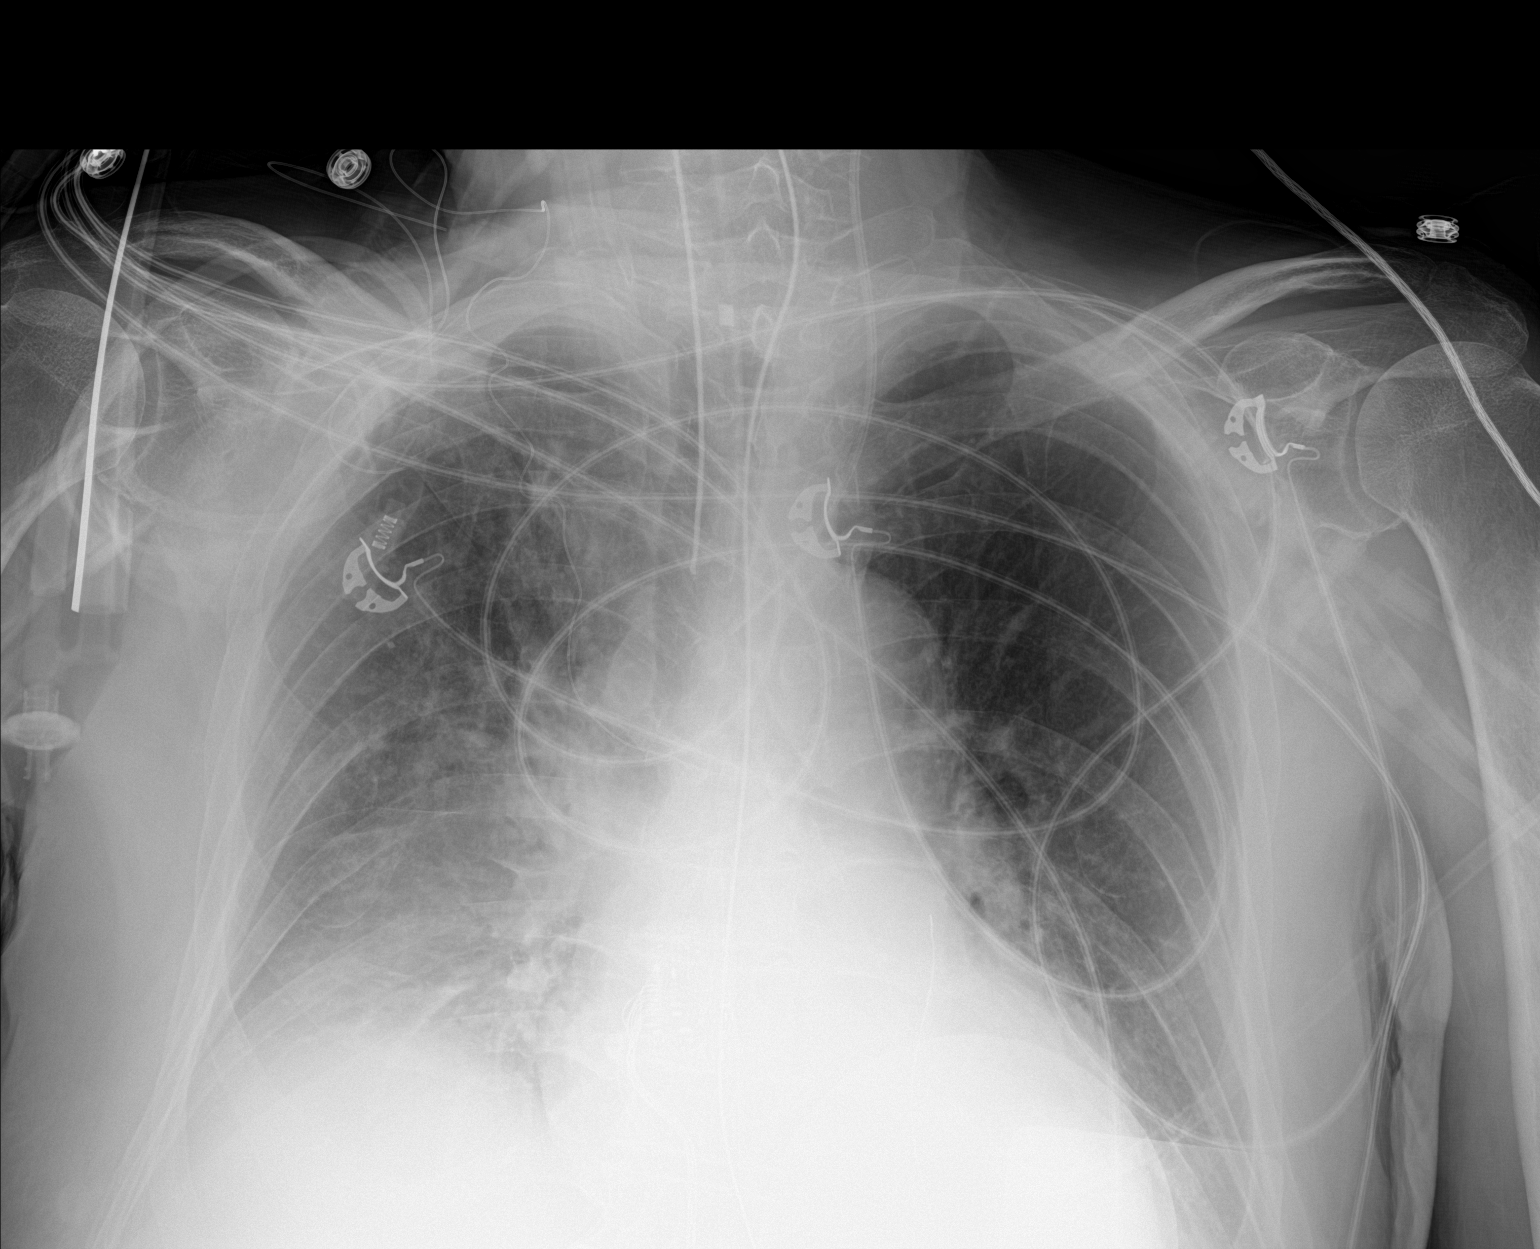

[1 of 1 positions shown; findings below may reference images not displayed]

FINDINGS: Improved aeration at the right lung base, presumably resolving
airspace collapse. Commensurate reduction of the rightward shift of
the mediastinal structures.

Endotracheal tube remains well positioned with tip approximately
cm above the carina. Enteric tube passes below the diaphragm. Left
IJ central line is stable in position with tip at the level of the
upper SVC.

Heart size is stable.  No pneumothorax seen.
IMPRESSION: 1. Improved aeration at the right lung base compared to the chest
x-ray from earlier today, presumably resolving airspace collapse
status post bronchoscopy. Commensurate reduction of the rightward
shift of the mediastinal structures.
2. Support apparatus appears stable in position.

## 2019-04-13 ENCOUNTER — Other Ambulatory Visit: Payer: Self-pay | Admitting: Family Medicine

## 2019-04-19 ENCOUNTER — Other Ambulatory Visit: Payer: Self-pay | Admitting: Family Medicine

## 2019-05-05 ENCOUNTER — Other Ambulatory Visit: Payer: Self-pay

## 2019-05-05 ENCOUNTER — Ambulatory Visit (INDEPENDENT_AMBULATORY_CARE_PROVIDER_SITE_OTHER): Payer: Medicare Other | Admitting: Family Medicine

## 2019-05-05 DIAGNOSIS — I10 Essential (primary) hypertension: Secondary | ICD-10-CM | POA: Diagnosis not present

## 2019-05-05 MED ORDER — AMLODIPINE BESYLATE 2.5 MG PO TABS: 2.5000 mg | ORAL_TABLET | Freq: Every day | ORAL | 3 refills | Status: DC

## 2019-05-05 NOTE — Progress Notes (Signed)
Patient ID: [name]., male   DOB: 08-Dec-1932, 85 y.o.   MRN: [id number]  This visit type was conducted due to national recommendations for restrictions regarding the COVID-19 pandemic in an effort to limit this patient's exposure and mitigate transmission in our community.   Virtual Visit via Telephone Note  I connected with [name] on 05/05/19 at 10:00 AM EDT by telephone and verified that I am speaking with the correct person using two identifiers.   I discussed the limitations, risks, security and privacy concerns of performing an evaluation and management service by telephone and the availability of in person appointments. I also discussed with the patient that there may be a patient responsible charge related to this service. The patient expressed understanding and agreed to proceed.  Location patient: home Location provider: work or home office Participants present for the call: patient, provider Patient did not have a visit in the prior 7 days to address this/these issue(s).   History of Present Illness:  Patient called with blood pressure concerns.  His blood pressures have been running up lately.  He gives several readings including 159/80, 144/85, 152/82, 160/73.  Currently takes lisinopril 20 mg daily and Coreg 6.25 mg twice daily.  No headaches or dizziness.  No chest pains.  No peripheral edema.  Generally feels well.  Had previously been on low-dose amlodipine but apparently had some hypotension when he had his cancer treatments.  His chronic problems include history of CAD, GERD, lymphoma.  Appetite and weight are stable   Observations/Objective: Patient sounds cheerful and well on the phone. I do not appreciate any SOB. Speech and thought processing are grossly intact. Patient reported vitals:  Assessment and Plan:  Hypertension poorly controlled with several elevated readings as above  -Add back low-dose amlodipine 2.5 mg daily -Recommend  follow-up in about 4 weeks to reassess.  Titrate further at that time if necessary  Follow Up Instructions:  -4 weeks   99441 5-10 [id number] 11-20 [id number] 21-30 I did not refer this patient for an OV in the next 24 hours for this/these issue(s).  I discussed the assessment and treatment plan with the patient. The patient was provided an opportunity to ask questions and all were answered. The patient agreed with the plan and demonstrated an understanding of the instructions.   The patient was advised to call back or seek an in-person evaluation if the symptoms worsen or if the condition fails to improve as anticipated.  I provided 16 minutes of non-face-to-face time during this encounter.   Carolann Littler, MD

## 2019-05-23 ENCOUNTER — Other Ambulatory Visit: Payer: Self-pay | Admitting: Family Medicine

## 2019-05-25 DIAGNOSIS — D0462 Carcinoma in situ of skin of left upper limb, including shoulder: Secondary | ICD-10-CM | POA: Diagnosis not present

## 2019-05-25 DIAGNOSIS — L821 Other seborrheic keratosis: Secondary | ICD-10-CM | POA: Diagnosis not present

## 2019-05-25 DIAGNOSIS — D485 Neoplasm of uncertain behavior of skin: Secondary | ICD-10-CM | POA: Diagnosis not present

## 2019-05-25 DIAGNOSIS — C44222 Squamous cell carcinoma of skin of right ear and external auricular canal: Secondary | ICD-10-CM | POA: Diagnosis not present

## 2019-05-25 DIAGNOSIS — L570 Actinic keratosis: Secondary | ICD-10-CM | POA: Diagnosis not present

## 2019-05-25 DIAGNOSIS — Z85828 Personal history of other malignant neoplasm of skin: Secondary | ICD-10-CM | POA: Diagnosis not present

## 2019-06-30 ENCOUNTER — Encounter: Payer: Self-pay | Admitting: Family Medicine

## 2019-06-30 ENCOUNTER — Other Ambulatory Visit: Payer: Self-pay

## 2019-06-30 ENCOUNTER — Ambulatory Visit (INDEPENDENT_AMBULATORY_CARE_PROVIDER_SITE_OTHER): Payer: Medicare Other | Admitting: Family Medicine

## 2019-06-30 VITALS — BP 132/82 | HR 65 | Temp 98.0°F | Ht 72.0 in | Wt 195.6 lb

## 2019-06-30 DIAGNOSIS — K219 Gastro-esophageal reflux disease without esophagitis: Secondary | ICD-10-CM | POA: Diagnosis not present

## 2019-06-30 DIAGNOSIS — Z23 Encounter for immunization: Secondary | ICD-10-CM | POA: Diagnosis not present

## 2019-06-30 DIAGNOSIS — I2510 Atherosclerotic heart disease of native coronary artery without angina pectoris: Secondary | ICD-10-CM | POA: Diagnosis not present

## 2019-06-30 DIAGNOSIS — C169 Malignant neoplasm of stomach, unspecified: Secondary | ICD-10-CM
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
## 2019-06-30 NOTE — Patient Instructions (Signed)
Try to gradually decrease the tea intake  Avoid eating within 2-3 hours of bedtime  Consider trial of Pepcid 20 mg one at night.

## 2019-06-30 NOTE — Addendum Note (Signed)
Addended by: Elmer Picker on: 06/30/2019 02:49 PM   Modules accepted: Orders
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
## 2019-07-09 ENCOUNTER — Telehealth: Payer: Self-pay | Admitting: Hematology

## 2019-07-09 ENCOUNTER — Other Ambulatory Visit: Payer: Self-pay

## 2019-07-09 ENCOUNTER — Inpatient Hospital Stay (HOSPITAL_BASED_OUTPATIENT_CLINIC_OR_DEPARTMENT_OTHER): Payer: Medicare Other | Admitting: Hematology

## 2019-07-09 ENCOUNTER — Inpatient Hospital Stay: Payer: Medicare Other | Attending: Hematology

## 2019-07-09 VITALS — BP 138/72 | HR 65 | Temp 99.1°F | Resp 17 | Ht 72.0 in | Wt 195.8 lb

## 2019-07-09 DIAGNOSIS — E440 Moderate protein-calorie malnutrition: Secondary | ICD-10-CM | POA: Diagnosis not present

## 2019-07-09 DIAGNOSIS — I2510 Atherosclerotic heart disease of native coronary artery without angina pectoris: Secondary | ICD-10-CM

## 2019-07-09 DIAGNOSIS — C8318 Mantle cell lymphoma, lymph nodes of multiple sites: Secondary | ICD-10-CM

## 2019-07-09 DIAGNOSIS — C169 Malignant neoplasm of stomach, unspecified: Secondary | ICD-10-CM

## 2019-07-09 DIAGNOSIS — C162 Malignant neoplasm of body of stomach: Secondary | ICD-10-CM

## 2019-07-09 LAB — CBC WITH DIFFERENTIAL/PLATELET
Abs Immature Granulocytes: 0 10*3/uL (ref 0.00–0.07)
Basophils Absolute: 0.1 10*3/uL (ref 0.0–0.1)
Basophils Relative: 1 %
Eosinophils Absolute: 0.2 10*3/uL (ref 0.0–0.5)
Eosinophils Relative: 4 %
HCT: 41.8 % (ref 39.0–52.0)
Hemoglobin: 14 g/dL (ref 13.0–17.0)
Immature Granulocytes: 0 %
Lymphocytes Relative: 45 %
Lymphs Abs: 2.6 10*3/uL (ref 0.7–4.0)
MCH: 34.2 pg — ABNORMAL HIGH (ref 26.0–34.0)
MCHC: 33.5 g/dL (ref 30.0–36.0)
MCV: 102.2 fL — ABNORMAL HIGH (ref 80.0–100.0)
Monocytes Absolute: 0.5 10*3/uL (ref 0.1–1.0)
Monocytes Relative: 8 %
Neutro Abs: 2.4 10*3/uL (ref 1.7–7.7)
Neutrophils Relative %: 42 %
Platelets: 153 10*3/uL (ref 150–400)
RBC: 4.09 MIL/uL — ABNORMAL LOW (ref 4.22–5.81)
RDW: 13.7 % (ref 11.5–15.5)
WBC: 5.8 10*3/uL (ref 4.0–10.5)
nRBC: 0 % (ref 0.0–0.2)

## 2019-07-09 LAB — CMP (CANCER CENTER ONLY)
ALT: 12 U/L (ref 0–44)
AST: 16 U/L (ref 15–41)
Albumin: 3.8 g/dL (ref 3.5–5.0)
Alkaline Phosphatase: 113 U/L (ref 38–126)
Anion gap: 6 (ref 5–15)
BUN: 14 mg/dL (ref 8–23)
CO2: 28 mmol/L (ref 22–32)
Calcium: 8.7 mg/dL — ABNORMAL LOW (ref 8.9–10.3)
Chloride: 108 mmol/L (ref 98–111)
Creatinine: 1.14 mg/dL (ref 0.61–1.24)
GFR, Est AFR Am: >60 mL/min (ref >60)
GFR, Estimated: 59 mL/min — ABNORMAL LOW (ref >60)
Glucose, Bld: 94 mg/dL (ref 70–99)
Potassium: 4 mmol/L (ref 3.5–5.1)
Sodium: 142 mmol/L (ref 135–145)
Total Bilirubin: 0.7 mg/dL (ref 0.3–1.2)
Total Protein: 6.2 g/dL — ABNORMAL LOW (ref 6.5–8.1)

## 2019-07-09 LAB — LACTATE DEHYDROGENASE: LDH: 167 U/L (ref 98–192)

## 2019-07-09 NOTE — Telephone Encounter (Signed)
Scheduled appt per 9/11 los.  Sent a message and a calendar will be mailed out.

## 2019-07-21 ENCOUNTER — Other Ambulatory Visit: Payer: Self-pay | Admitting: Family Medicine

## 2019-08-01 ENCOUNTER — Other Ambulatory Visit: Payer: Self-pay | Admitting: Family Medicine

## 2019-08-03 NOTE — Telephone Encounter (Signed)
Will need to clarify with pt if he is still taking,  If so, may refill for 6 months.

## 2019-08-03 NOTE — Telephone Encounter (Signed)
Rx not on patient's current med list? 

## 2019-08-03 NOTE — Telephone Encounter (Signed)
I called the pt and per pts request, I spoke with this wife to inform her of the message below.  She stated he is not taking this medication and a denial was sent to the pharmacy.  Also advised Mrs Minette Brine the refill for Ferrous Sulfate was sent on 9/25.

## 2019-08-05 ENCOUNTER — Telehealth: Payer: Self-pay | Admitting: Family Medicine

## 2019-08-05 NOTE — Telephone Encounter (Signed)
Copied from [place] [id number]. Topic: Medicare AWV >> Jul 15, 2019  3:59 PM Reyne Dumas L wrote: Reason for CRM:   Pt states that he just received a message to call back and schedule an annual wellness visit.   I have the patient scheduled for 11/08/2019 at 10:15 for a telephone visit.

## 2019-08-05 NOTE — Telephone Encounter (Signed)
The patient does not have a voice mail set up to leave a message

## 2019-08-29 ENCOUNTER — Other Ambulatory Visit: Payer: Self-pay | Admitting: Family Medicine

## 2019-09-01 DIAGNOSIS — H02055 Trichiasis without entropian left lower eyelid: Secondary | ICD-10-CM | POA: Diagnosis not present

## 2019-09-01 DIAGNOSIS — H43813 Vitreous degeneration, bilateral: Secondary | ICD-10-CM | POA: Diagnosis not present

## 2019-09-01 DIAGNOSIS — H02052 Trichiasis without entropian right lower eyelid: Secondary | ICD-10-CM | POA: Diagnosis not present

## 2019-09-01 DIAGNOSIS — D3132 Benign neoplasm of left choroid: Secondary | ICD-10-CM | POA: Diagnosis not present

## 2019-09-01 DIAGNOSIS — H04123 Dry eye syndrome of bilateral lacrimal glands: Secondary | ICD-10-CM | POA: Diagnosis not present

## 2019-09-01 DIAGNOSIS — H2513 Age-related nuclear cataract, bilateral: Secondary | ICD-10-CM | POA: Diagnosis not present
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
## 2019-09-17 ENCOUNTER — Encounter: Payer: Self-pay | Admitting: Internal Medicine

## 2019-09-17 ENCOUNTER — Other Ambulatory Visit: Payer: Self-pay

## 2019-09-17 ENCOUNTER — Ambulatory Visit (INDEPENDENT_AMBULATORY_CARE_PROVIDER_SITE_OTHER): Payer: Medicare Other | Admitting: Internal Medicine

## 2019-09-17 VITALS — BP 150/84 | HR 61 | Ht 72.0 in | Wt 189.0 lb

## 2019-09-17 DIAGNOSIS — I2510 Atherosclerotic heart disease of native coronary artery without angina pectoris: Secondary | ICD-10-CM

## 2019-09-17 DIAGNOSIS — I5042 Chronic combined systolic (congestive) and diastolic (congestive) heart failure: Secondary | ICD-10-CM | POA: Diagnosis not present

## 2019-09-17 MED ORDER — SACUBITRIL-VALSARTAN 24-26 MG PO TABS: 1.0000 | ORAL_TABLET | Freq: Two times a day (BID) | ORAL | 6 refills | Status: DC

## 2019-09-17 NOTE — Patient Instructions (Addendum)
Medication Instructions:  Your physician has recommended you make the following change in your medication:  1.) stop lisinopril 2.) start Entresto 24/26 mg one tablet by mouth twice a day  *If you need a refill on your cardiac medications before your next appointment, please call your pharmacy*  Lab Work: none If you have labs (blood work) drawn today and your tests are completely normal, you will receive your results only by: [name] MyChart Message (if you have MyChart) OR . A paper copy in the mail If you have any lab test that is abnormal or we need to change your treatment, we will call you to review the results.  Testing/Procedures: none  Follow-Up: At [facility], you and your health needs are our priority.  As part of our continuing mission to provide you with exceptional heart care, we have created designated Provider Care Teams.  These Care Teams include your primary Cardiologist (physician) and Advanced Practice Providers (APPs -  Physician Assistants and Nurse Practitioners) who all work together to provide you with the care you need, when you need it.  Your next appointment:   9 months (Next Summer)  The format for your next appointment:   In Person  Provider:   Dorris Carnes, MD  Other Instructions Blood Pressure check on Entresto on 10/05/19 at 11:00 am with the nurse.

## 2019-09-22 ENCOUNTER — Other Ambulatory Visit: Payer: Self-pay

## 2019-10-05 ENCOUNTER — Other Ambulatory Visit: Payer: Self-pay

## 2019-10-05 ENCOUNTER — Ambulatory Visit: Payer: Medicare Other | Admitting: *Deleted

## 2019-10-05 VITALS — BP 146/72 | HR 64 | Resp 16

## 2019-10-05 DIAGNOSIS — Z0130 Encounter for examination of blood pressure without abnormal findings: Secondary | ICD-10-CM

## 2019-10-05 NOTE — Progress Notes (Signed)
1.) Reason for visit: BP check  2.) Name of MD requesting visit: Dr. Harrington Challenger  3.) H&P: pt started Entresto 24/26 mg 09/21/19 (stopped lisinopril 11/20)  4.) ROS related to problem: not feeling any different with medication change.  HR stays in the 60s at home.  Blood pressure little lower at home in 130s/.  No dizziness, no new fatigue.  5.) Assessment and plan per MD: pt aware this information will be reviewed by Dr. Harrington Challenger.  Aware to continue same medicines.  If Dr. Harrington Challenger wants to titrate [facility], we will contact him.

## 2019-10-06 NOTE — Progress Notes (Signed)
Would incease entresto to 49/51    Keep track of BP

## 2019-10-07 DIAGNOSIS — H2512 Age-related nuclear cataract, left eye: Secondary | ICD-10-CM | POA: Diagnosis not present

## 2019-10-12 ENCOUNTER — Telehealth: Payer: Self-pay | Admitting: *Deleted

## 2019-10-12 MED ORDER — SACUBITRIL-VALSARTAN 49-51 MG PO TABS: 1.0000 | ORAL_TABLET | Freq: Two times a day (BID) | ORAL | 5 refills | Status: DC

## 2019-10-12 NOTE — Telephone Encounter (Signed)
Message from Dr. Harrington Challenger to increase Delene Loll to 49/51 mg twice a day. I called pt and left message for him to call back to notify him of recommended change in dosage.

## 2019-10-12 NOTE — Telephone Encounter (Signed)
Patient called back.  In agreement to increase Entresto to 49/51 mg twice a day.  Understands to monitor BP and call with any dizziness or significant fatigue. Confir.med pharmacy

## 2019-10-12 NOTE — Telephone Encounter (Signed)
-----   Message from Fay Records, MD sent at 10/06/2019 11:20 PM EST -----   ----- Message ----- From: Rodman Key, RN Sent: 10/05/2019  12:05 PM EST To: Fay Records, MD

## 2019-10-14 ENCOUNTER — Other Ambulatory Visit: Payer: Self-pay

## 2019-10-14 ENCOUNTER — Ambulatory Visit (INDEPENDENT_AMBULATORY_CARE_PROVIDER_SITE_OTHER): Payer: Medicare Other | Admitting: Otolaryngology

## 2019-10-14 ENCOUNTER — Encounter (INDEPENDENT_AMBULATORY_CARE_PROVIDER_SITE_OTHER): Payer: Self-pay | Admitting: Otolaryngology

## 2019-10-14 VITALS — Temp 97.9°F

## 2019-10-14 DIAGNOSIS — H7292 Unspecified perforation of tympanic membrane, left ear: Secondary | ICD-10-CM

## 2019-10-14 DIAGNOSIS — I2510 Atherosclerotic heart disease of native coronary artery without angina pectoris: Secondary | ICD-10-CM | POA: Diagnosis not present
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
## 2019-11-02 ENCOUNTER — Ambulatory Visit (HOSPITAL_COMMUNITY)
Admission: RE | Admit: 2019-11-02 | Discharge: 2019-11-02 | Disposition: A | Discharge status: Home / Self Care | Payer: Medicare Other | Source: Ambulatory Visit | Attending: Hematology | Admitting: Hematology

## 2019-11-02 ENCOUNTER — Inpatient Hospital Stay: Payer: Medicare Other | Attending: Hematology

## 2019-11-02 ENCOUNTER — Other Ambulatory Visit: Payer: Self-pay

## 2019-11-02 DIAGNOSIS — D630 Anemia in neoplastic disease: Secondary | ICD-10-CM | POA: Diagnosis not present

## 2019-11-02 DIAGNOSIS — C8318 Mantle cell lymphoma, lymph nodes of multiple sites: Secondary | ICD-10-CM

## 2019-11-02 DIAGNOSIS — C169 Malignant neoplasm of stomach, unspecified: Secondary | ICD-10-CM | POA: Diagnosis not present

## 2019-11-02 DIAGNOSIS — K5900 Constipation, unspecified: Secondary | ICD-10-CM | POA: Insufficient documentation

## 2019-11-02 LAB — CMP (CANCER CENTER ONLY)
ALT: 18 U/L (ref 0–44)
AST: 18 U/L (ref 15–41)
Albumin: 4 g/dL (ref 3.5–5.0)
Alkaline Phosphatase: 126 U/L (ref 38–126)
Anion gap: 11 (ref 5–15)
BUN: 17 mg/dL (ref 8–23)
CO2: 24 mmol/L (ref 22–32)
Calcium: 8.9 mg/dL (ref 8.9–10.3)
Chloride: 109 mmol/L (ref 98–111)
Creatinine: 1.17 mg/dL (ref 0.61–1.24)
GFR, Est AFR Am: >60 mL/min (ref >60)
GFR, Estimated: 57 mL/min — ABNORMAL LOW (ref >60)
Glucose, Bld: 93 mg/dL (ref 70–99)
Potassium: 3.9 mmol/L (ref 3.5–5.1)
Sodium: 144 mmol/L (ref 135–145)
Total Bilirubin: 0.7 mg/dL (ref 0.3–1.2)
Total Protein: 6.6 g/dL (ref 6.5–8.1)

## 2019-11-02 LAB — CBC WITH DIFFERENTIAL/PLATELET
Abs Immature Granulocytes: 0.01 10*3/uL (ref 0.00–0.07)
Basophils Absolute: 0.1 10*3/uL (ref 0.0–0.1)
Basophils Relative: 1 %
Eosinophils Absolute: 0.2 10*3/uL (ref 0.0–0.5)
Eosinophils Relative: 2 %
HCT: 44.8 % (ref 39.0–52.0)
Hemoglobin: 15.1 g/dL (ref 13.0–17.0)
Immature Granulocytes: 0 %
Lymphocytes Relative: 49 %
Lymphs Abs: 3 10*3/uL (ref 0.7–4.0)
MCH: 34.3 pg — ABNORMAL HIGH (ref 26.0–34.0)
MCHC: 33.7 g/dL (ref 30.0–36.0)
MCV: 101.8 fL — ABNORMAL HIGH (ref 80.0–100.0)
Monocytes Absolute: 0.5 10*3/uL (ref 0.1–1.0)
Monocytes Relative: 9 %
Neutro Abs: 2.4 10*3/uL (ref 1.7–7.7)
Neutrophils Relative %: 39 %
Platelets: 153 10*3/uL (ref 150–400)
RBC: 4.4 MIL/uL (ref 4.22–5.81)
RDW: 13.7 % (ref 11.5–15.5)
WBC: 6.2 10*3/uL (ref 4.0–10.5)
nRBC: 0 % (ref 0.0–0.2)

## 2019-11-02 LAB — LACTATE DEHYDROGENASE: LDH: 191 U/L (ref 98–192)

## 2019-11-02 MED ORDER — IOHEXOL 9 MG/ML PO SOLN: 500.0000 mL | ORAL | Status: AC

## 2019-11-02 MED ORDER — SODIUM CHLORIDE (PF) 0.9 % IJ SOLN
INTRAMUSCULAR | Status: AC
  Filled 2019-11-02: qty 50

## 2019-11-02 MED ORDER — IOHEXOL 9 MG/ML PO SOLN
ORAL | Status: AC
  Filled 2019-11-02: qty 1000

## 2019-11-02 MED ORDER — IOHEXOL 300 MG/ML  SOLN
100.0000 mL | Freq: Once | INTRAMUSCULAR | Status: AC | PRN
  Administered 2019-11-02: 14:00:00 100 mL via INTRAVENOUS

## 2019-11-08 ENCOUNTER — Other Ambulatory Visit: Payer: Self-pay

## 2019-11-08 ENCOUNTER — Ambulatory Visit (INDEPENDENT_AMBULATORY_CARE_PROVIDER_SITE_OTHER): Payer: Medicare Other | Admitting: Family Medicine

## 2019-11-08 DIAGNOSIS — Z0000 Encounter for general adult medical examination without abnormal findings: Secondary | ICD-10-CM

## 2019-11-08 NOTE — Progress Notes (Signed)
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 vaccine when available -Advised pt that the enlarged lymph nodes in his abdomen will likely be difficult to biopsy and could be the result of inflammation -Discussed being more aggressive and attempting a LN Bx vs watching an rpt scans in 3 months  -Pt would prefer to rpt scans -Recommended pt continue to f/u with Dermatologist for skin cancer screenings -Will get PET/CT in 9 weeks  -Will see back in 10 weeks with labs   FOLLOW UP: PET/CT in 9 weeks RTC with Dr Irene Limbo in 10 weeks  The total time spent in the appt was 20 minutes and more than 50% was on counseling and direct patient cares.  All of the patient's questions were answered with apparent satisfaction. The patient knows to call the clinic with any problems, questions or concerns.   Sullivan Lone MD [place] AAHIVMS [facility] Hematology/Oncology Physician [facility]  (Office):       [phone] (Work cell):  [phone] (Fax):           [phone]   I, Yevette Edwards, am acting as a scribe for Dr. Sullivan Lone.   .I have reviewed the  above documentation for accuracy and completeness, and I agree with the above. [name]  MD
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
## 2019-11-09 ENCOUNTER — Inpatient Hospital Stay (HOSPITAL_BASED_OUTPATIENT_CLINIC_OR_DEPARTMENT_OTHER): Payer: Medicare Other | Admitting: Hematology

## 2019-11-09 ENCOUNTER — Other Ambulatory Visit: Payer: Self-pay

## 2019-11-09 VITALS — BP 152/74 | HR 66 | Temp 99.2°F | Resp 18 | Ht 72.0 in | Wt 193.3 lb

## 2019-11-09 DIAGNOSIS — C8318 Mantle cell lymphoma, lymph nodes of multiple sites: Secondary | ICD-10-CM

## 2019-11-09 DIAGNOSIS — K5900 Constipation, unspecified: Secondary | ICD-10-CM | POA: Diagnosis not present

## 2019-11-09 DIAGNOSIS — R590 Localized enlarged lymph nodes: Secondary | ICD-10-CM

## 2019-11-09 DIAGNOSIS — C169 Malignant neoplasm of stomach, unspecified: Secondary | ICD-10-CM | POA: Diagnosis not present

## 2019-11-09 DIAGNOSIS — D630 Anemia in neoplastic disease: Secondary | ICD-10-CM | POA: Diagnosis not present

## 2019-11-10 ENCOUNTER — Telehealth: Payer: Self-pay | Admitting: Hematology

## 2019-11-10 NOTE — Telephone Encounter (Signed)
Scheduled appt per 1/12 los.  Sent a message to HIM pool to get a calendar mailed out. 

## 2019-11-17 ENCOUNTER — Telehealth: Payer: Self-pay | Admitting: *Deleted

## 2019-11-17 NOTE — Telephone Encounter (Signed)
Called patient and gave him the message. Patient verbalized an understanding and stated that he had an insurance change and that he is going to wait until his next refill and ask the pharmacy if it will be cheaper. Patient will contact Dr. Harrington Challenger at cardiology about the [facility].

## 2019-11-17 NOTE — Telephone Encounter (Signed)
Please see message. °

## 2019-11-17 NOTE — Telephone Encounter (Signed)
Copied from [place] [phone]. Topic: General - Other >> Nov 17, 2019  2:23 PM [name] A wrote: Reason for CRM: Patient called to inform Dr Elease Hashimoto that he had to pay over $200 in order to get his Rx from the pharmacy for sacubitril-valsartan (ENTRESTO) 49-51 MG and pantoprazole (PROTONIX) 40 MG tablet. Patient is asking if Dr can please contact the pharmacy and straighten his medication so that he do not have to come out of pocket for so much money and that he need 90 day supplies for them. Please contact patient with questions

## 2019-11-17 NOTE — Telephone Encounter (Signed)
[name] was prescribed by his cardiologist and they would have to be involved with making any decisions regarding that.  That is a medication used to treat patients with heart failure and there are no generic substitutions.  We could look at possible change to another medication for Protonix such as omeprazole 40 mg twice daily

## 2019-11-20 ENCOUNTER — Other Ambulatory Visit: Payer: Self-pay | Admitting: Family Medicine

## 2019-11-24 ENCOUNTER — Other Ambulatory Visit: Payer: Self-pay | Admitting: Family Medicine

## 2019-11-25 DIAGNOSIS — L570 Actinic keratosis: Secondary | ICD-10-CM | POA: Diagnosis not present

## 2019-11-25 DIAGNOSIS — L821 Other seborrheic keratosis: Secondary | ICD-10-CM | POA: Diagnosis not present

## 2019-11-25 DIAGNOSIS — D692 Other nonthrombocytopenic purpura: Secondary | ICD-10-CM | POA: Diagnosis not present

## 2019-11-25 DIAGNOSIS — C44329 Squamous cell carcinoma of skin of other parts of face: Secondary | ICD-10-CM | POA: Diagnosis not present

## 2019-11-25 DIAGNOSIS — D225 Melanocytic nevi of trunk: Secondary | ICD-10-CM | POA: Diagnosis not present

## 2019-11-25 DIAGNOSIS — D485 Neoplasm of uncertain behavior of skin: Secondary | ICD-10-CM | POA: Diagnosis not present

## 2019-11-25 DIAGNOSIS — Z85828 Personal history of other malignant neoplasm of skin: Secondary | ICD-10-CM | POA: Diagnosis not present

## 2019-12-09 ENCOUNTER — Other Ambulatory Visit: Payer: Self-pay | Admitting: Family Medicine

## 2019-12-21 ENCOUNTER — Telehealth: Payer: Self-pay | Admitting: Family Medicine

## 2019-12-21 NOTE — Telephone Encounter (Signed)
[name] is requesting a  prior authorization for pantoprazole(PROTONIX) 40 MG tablet the prescription number is [id number] and phone number is [phone]. Jacob, stated pt is out of medication and would also like a prescription for a few days sent to [facility] in [place]. Thanks   Jacob with Insurance account manager  # [phone]

## 2019-12-22 NOTE — Telephone Encounter (Signed)
Spoke with [name] from [employer]. PA approved for one year, until 12/21/2020. PA# [id number]

## 2019-12-24 ENCOUNTER — Other Ambulatory Visit: Payer: Self-pay | Admitting: Family Medicine

## 2020-01-11 ENCOUNTER — Ambulatory Visit (HOSPITAL_COMMUNITY)
Admission: RE | Admit: 2020-01-11 | Discharge: 2020-01-11 | Disposition: A | Discharge status: Home / Self Care | Payer: Medicare Other | Source: Ambulatory Visit | Attending: Hematology | Admitting: Hematology

## 2020-01-11 ENCOUNTER — Other Ambulatory Visit: Payer: Self-pay

## 2020-01-11 DIAGNOSIS — Z79899 Other long term (current) drug therapy: Secondary | ICD-10-CM | POA: Insufficient documentation

## 2020-01-11 DIAGNOSIS — C169 Malignant neoplasm of stomach, unspecified: Secondary | ICD-10-CM | POA: Insufficient documentation

## 2020-01-11 DIAGNOSIS — I700 Atherosclerosis of aorta: Secondary | ICD-10-CM | POA: Insufficient documentation

## 2020-01-11 DIAGNOSIS — C8318 Mantle cell lymphoma, lymph nodes of multiple sites: Secondary | ICD-10-CM | POA: Diagnosis not present

## 2020-01-11 DIAGNOSIS — R590 Localized enlarged lymph nodes: Secondary | ICD-10-CM | POA: Diagnosis not present

## 2020-01-11 DIAGNOSIS — C8310 Mantle cell lymphoma, unspecified site: Secondary | ICD-10-CM | POA: Diagnosis not present

## 2020-01-11 LAB — GLUCOSE, CAPILLARY: Glucose-Capillary: 87 mg/dL (ref 70–99)

## 2020-01-11 MED ORDER — FLUDEOXYGLUCOSE F - 18 (FDG) INJECTION
9.5000 | Freq: Once | INTRAVENOUS | Status: AC | PRN
  Administered 2020-01-11: 9.5 via INTRAVENOUS
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
## 2020-01-18 ENCOUNTER — Other Ambulatory Visit: Payer: Self-pay

## 2020-01-18 ENCOUNTER — Inpatient Hospital Stay: Payer: Medicare Other | Attending: Hematology | Admitting: Hematology

## 2020-01-18 ENCOUNTER — Inpatient Hospital Stay: Payer: Medicare Other

## 2020-01-18 VITALS — BP 149/72 | HR 69 | Temp 98.9°F | Resp 18 | Ht 72.0 in | Wt 191.8 lb

## 2020-01-18 DIAGNOSIS — C169 Malignant neoplasm of stomach, unspecified: Secondary | ICD-10-CM

## 2020-01-18 DIAGNOSIS — C8318 Mantle cell lymphoma, lymph nodes of multiple sites: Secondary | ICD-10-CM | POA: Insufficient documentation

## 2020-01-18 DIAGNOSIS — R590 Localized enlarged lymph nodes: Secondary | ICD-10-CM

## 2020-01-18 LAB — CBC WITH DIFFERENTIAL/PLATELET
Abs Immature Granulocytes: 0.01 10*3/uL (ref 0.00–0.07)
Basophils Absolute: 0 10*3/uL (ref 0.0–0.1)
Basophils Relative: 1 %
Eosinophils Absolute: 0.2 10*3/uL (ref 0.0–0.5)
Eosinophils Relative: 3 %
HCT: 41.4 % (ref 39.0–52.0)
Hemoglobin: 13.9 g/dL (ref 13.0–17.0)
Immature Granulocytes: 0 %
Lymphocytes Relative: 55 %
Lymphs Abs: 3.3 10*3/uL (ref 0.7–4.0)
MCH: 33.5 pg (ref 26.0–34.0)
MCHC: 33.6 g/dL (ref 30.0–36.0)
MCV: 99.8 fL (ref 80.0–100.0)
Monocytes Absolute: 0.6 10*3/uL (ref 0.1–1.0)
Monocytes Relative: 9 %
Neutro Abs: 1.9 10*3/uL (ref 1.7–7.7)
Neutrophils Relative %: 32 %
Platelets: 157 10*3/uL (ref 150–400)
RBC: 4.15 MIL/uL — ABNORMAL LOW (ref 4.22–5.81)
RDW: 14 % (ref 11.5–15.5)
WBC: 6 10*3/uL (ref 4.0–10.5)
nRBC: 0 % (ref 0.0–0.2)

## 2020-01-18 LAB — CMP (CANCER CENTER ONLY)
ALT: 14 U/L (ref 0–44)
AST: 18 U/L (ref 15–41)
Albumin: 3.3 g/dL — ABNORMAL LOW (ref 3.5–5.0)
Alkaline Phosphatase: 112 U/L (ref 38–126)
Anion gap: 6 (ref 5–15)
BUN: 13 mg/dL (ref 8–23)
CO2: 27 mmol/L (ref 22–32)
Calcium: 8.4 mg/dL — ABNORMAL LOW (ref 8.9–10.3)
Chloride: 108 mmol/L (ref 98–111)
Creatinine: 1.08 mg/dL (ref 0.61–1.24)
GFR, Est AFR Am: >60 mL/min (ref >60)
GFR, Estimated: >60 mL/min (ref >60)
Glucose, Bld: 102 mg/dL — ABNORMAL HIGH (ref 70–99)
Potassium: 3.9 mmol/L (ref 3.5–5.1)
Sodium: 141 mmol/L (ref 135–145)
Total Bilirubin: 0.6 mg/dL (ref 0.3–1.2)
Total Protein: 5.7 g/dL — ABNORMAL LOW (ref 6.5–8.1)

## 2020-01-18 LAB — LACTATE DEHYDROGENASE: LDH: 170 U/L (ref 98–192)

## 2020-01-27 ENCOUNTER — Encounter: Payer: Self-pay | Admitting: Hematology

## 2020-02-01 ENCOUNTER — Other Ambulatory Visit: Payer: Self-pay | Admitting: Family Medicine

## 2020-02-05 ENCOUNTER — Other Ambulatory Visit: Payer: Self-pay | Admitting: Internal Medicine

## 2020-02-07 ENCOUNTER — Ambulatory Visit: Payer: Medicare Other | Admitting: Family Medicine

## 2020-02-07 MED ORDER — SACUBITRIL-VALSARTAN 49-51 MG PO TABS: 1.0000 | ORAL_TABLET | Freq: Two times a day (BID) | ORAL | 2 refills | Status: DC

## 2020-02-14 ENCOUNTER — Encounter: Payer: Self-pay | Admitting: Family Medicine

## 2020-02-14 ENCOUNTER — Other Ambulatory Visit: Payer: Self-pay | Admitting: Family Medicine

## 2020-02-14 ENCOUNTER — Other Ambulatory Visit: Payer: Self-pay

## 2020-02-14 ENCOUNTER — Ambulatory Visit (INDEPENDENT_AMBULATORY_CARE_PROVIDER_SITE_OTHER): Payer: Medicare Other | Admitting: Family Medicine

## 2020-02-14 VITALS — BP 116/66 | HR 65 | Temp 98.0°F | Wt 187.7 lb

## 2020-02-14 DIAGNOSIS — E782 Mixed hyperlipidemia: Secondary | ICD-10-CM

## 2020-02-14 DIAGNOSIS — I10 Essential (primary) hypertension: Secondary | ICD-10-CM | POA: Diagnosis not present

## 2020-02-14 DIAGNOSIS — Z9181 History of falling: Secondary | ICD-10-CM | POA: Diagnosis not present

## 2020-02-14 DIAGNOSIS — K5900 Constipation, unspecified: Secondary | ICD-10-CM

## 2020-02-14 LAB — LIPID PANEL
Cholesterol: 99 mg/dL (ref 0–200)
HDL: 28 mg/dL — ABNORMAL LOW (ref >39.00)
LDL Cholesterol: 46 mg/dL (ref 0–99)
NonHDL: 71.17
Total CHOL/HDL Ratio: 4
Triglycerides: 125 mg/dL (ref 0.0–149.0)
VLDL: 25 mg/dL (ref 0.0–40.0)
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
## 2020-02-14 NOTE — Patient Instructions (Signed)
Constipation, Adult Constipation is when a person has fewer bowel movements in a week than normal, has difficulty having a bowel movement, or has stools that are dry, hard, or larger than normal. Constipation may be caused by an underlying condition. It may become worse with age if a person takes certain medicines and does not take in enough fluids. Follow these instructions at home: Eating and drinking   Eat foods that have a lot of fiber, such as fresh fruits and vegetables, whole grains, and beans.  Limit foods that are high in fat, low in fiber, or overly processed, such as french fries, hamburgers, cookies, candies, and soda.  Drink enough fluid to keep your urine clear or pale yellow. General instructions  Exercise regularly or as told by your health care provider.  Go to the restroom when you have the urge to go. Do not hold it in.  Take over-the-counter and prescription medicines only as told by your health care provider. These include any fiber supplements.  Practice pelvic floor retraining exercises, such as deep breathing while relaxing the lower abdomen and pelvic floor relaxation during bowel movements.  Watch your condition for any changes.  Keep all follow-up visits as told by your health care provider. This is important. Contact a health care provider if:  You have pain that gets worse.  You have a fever.  You do not have a bowel movement after 4 days.  You vomit.  You are not hungry.  You lose weight.  You are bleeding from the anus.  You have thin, pencil-like stools. Get help right away if:  You have a fever and your symptoms suddenly get worse.  You leak stool or have blood in your stool.  Your abdomen is bloated.  You have severe pain in your abdomen.  You feel dizzy or you faint. This information is not intended to replace advice given to you by your health care provider. Make sure you discuss any questions you have with your health care  provider. Document Revised: 09/26/2017 Document Reviewed: 04/03/2016 Elsevier Patient Education  [place].  Increase fluid intake Consider fiber supplement such as Metamucil or Fibercon. May use Miralax as needed.

## 2020-02-20 ENCOUNTER — Other Ambulatory Visit: Payer: Self-pay | Admitting: Physician Assistant

## 2020-02-21 ENCOUNTER — Encounter: Payer: Self-pay | Admitting: Hematology

## 2020-02-22 ENCOUNTER — Other Ambulatory Visit: Payer: Self-pay | Admitting: Hematology

## 2020-02-22 DIAGNOSIS — C8318 Mantle cell lymphoma, lymph nodes of multiple sites: Secondary | ICD-10-CM

## 2020-02-22 DIAGNOSIS — C169 Malignant neoplasm of stomach, unspecified: Secondary | ICD-10-CM

## 2020-02-22 DIAGNOSIS — R590 Localized enlarged lymph nodes: Secondary | ICD-10-CM

## 2020-02-22 NOTE — Progress Notes (Signed)
Discussed with Dr Anselm Pancoast Everlean Alstrom-- will order CT guided Biopsy of FDG avid retroperitoneal LN.

## 2020-02-23 ENCOUNTER — Encounter (HOSPITAL_COMMUNITY): Payer: Self-pay | Admitting: Radiology

## 2020-02-23 NOTE — Progress Notes (Signed)
[name]. [name]. Male, 86 y.o., 11/12/1932 MRN:  [id number] Phone:  [phone] (H) PCP:  Eulas Post, MD Primary Cvg:  Medicare/Medicare Part A And B Next Appt With Radiology ([facility]-CT 1) 03/10/2020 at 11:00 AM  RE: CT Biopsy Received: Yesterday Message Contents  Markus Daft, MD  Garth Bigness D  Schedule for CT guided biopsy of left retroperitoneal lymph node.   [place]       Previous Messages   ----- Message -----  From: Garth Bigness D  Sent: 02/22/2020  4:21 PM EDT  To: Markus Daft, MD  Subject: CT Biopsy                     Procedure:  CT Biopsy   Reason: Gastric adenocarcinoma, Mantle cell lymphoma of lymph nodes of multiple regions, Retroperitoneal lymphadenopathy, Patient a history of mantle cell lymphoma and gastric adenocarcinoma for CT guided biopsy of FDG avid retroperitoneal LN. (Discussed with an approved by Dr Anselm Pancoast)   History: NM PRT, CT in computer   Provider: [name]   Provider Contact: [phone]

## 2020-03-09 ENCOUNTER — Other Ambulatory Visit: Payer: Self-pay | Admitting: Student

## 2020-03-10 ENCOUNTER — Ambulatory Visit (HOSPITAL_COMMUNITY)
Admission: RE | Admit: 2020-03-10 | Discharge: 2020-03-10 | Disposition: A | Discharge status: Home / Self Care | Payer: Medicare Other | Source: Ambulatory Visit | Attending: Hematology | Admitting: Hematology

## 2020-03-10 ENCOUNTER — Encounter (HOSPITAL_COMMUNITY): Payer: Self-pay

## 2020-03-10 ENCOUNTER — Other Ambulatory Visit: Payer: Self-pay

## 2020-03-10 DIAGNOSIS — K219 Gastro-esophageal reflux disease without esophagitis: Secondary | ICD-10-CM | POA: Insufficient documentation

## 2020-03-10 DIAGNOSIS — Z79899 Other long term (current) drug therapy: Secondary | ICD-10-CM | POA: Insufficient documentation

## 2020-03-10 DIAGNOSIS — R590 Localized enlarged lymph nodes: Secondary | ICD-10-CM | POA: Diagnosis not present

## 2020-03-10 DIAGNOSIS — C772 Secondary and unspecified malignant neoplasm of intra-abdominal lymph nodes: Secondary | ICD-10-CM | POA: Diagnosis not present

## 2020-03-10 DIAGNOSIS — I509 Heart failure, unspecified: Secondary | ICD-10-CM | POA: Insufficient documentation

## 2020-03-10 DIAGNOSIS — C8318 Mantle cell lymphoma, lymph nodes of multiple sites: Secondary | ICD-10-CM | POA: Insufficient documentation

## 2020-03-10 DIAGNOSIS — Z888 Allergy status to other drugs, medicaments and biological substances status: Secondary | ICD-10-CM | POA: Insufficient documentation

## 2020-03-10 DIAGNOSIS — M109 Gout, unspecified: Secondary | ICD-10-CM | POA: Insufficient documentation

## 2020-03-10 DIAGNOSIS — I110 Hypertensive heart disease with heart failure: Secondary | ICD-10-CM | POA: Insufficient documentation

## 2020-03-10 DIAGNOSIS — E785 Hyperlipidemia, unspecified: Secondary | ICD-10-CM | POA: Diagnosis not present

## 2020-03-10 DIAGNOSIS — C169 Malignant neoplasm of stomach, unspecified: Secondary | ICD-10-CM | POA: Diagnosis not present

## 2020-03-10 DIAGNOSIS — L409 Psoriasis, unspecified: Secondary | ICD-10-CM | POA: Insufficient documentation

## 2020-03-10 DIAGNOSIS — I700 Atherosclerosis of aorta: Secondary | ICD-10-CM | POA: Insufficient documentation

## 2020-03-10 DIAGNOSIS — Z85028 Personal history of other malignant neoplasm of stomach: Secondary | ICD-10-CM | POA: Diagnosis not present

## 2020-03-10 DIAGNOSIS — D649 Anemia, unspecified: Secondary | ICD-10-CM | POA: Diagnosis not present

## 2020-03-10 DIAGNOSIS — C8313 Mantle cell lymphoma, intra-abdominal lymph nodes: Secondary | ICD-10-CM | POA: Diagnosis not present

## 2020-03-10 LAB — CBC
HCT: 46.1 % (ref 39.0–52.0)
Hemoglobin: 14.9 g/dL (ref 13.0–17.0)
MCH: 33.5 pg (ref 26.0–34.0)
MCHC: 32.3 g/dL (ref 30.0–36.0)
MCV: 103.6 fL — ABNORMAL HIGH (ref 80.0–100.0)
Platelets: 152 10*3/uL (ref 150–400)
RBC: 4.45 MIL/uL (ref 4.22–5.81)
RDW: 14.6 % (ref 11.5–15.5)
WBC: 7.5 10*3/uL (ref 4.0–10.5)
nRBC: 0 % (ref 0.0–0.2)

## 2020-03-10 LAB — PROTIME-INR
INR: 1 (ref 0.8–1.2)
Prothrombin Time: 13.2 seconds (ref 11.4–15.2)

## 2020-03-10 MED ORDER — MIDAZOLAM HCL 2 MG/2ML IJ SOLN
INTRAMUSCULAR | Status: AC
  Filled 2020-03-10: qty 4

## 2020-03-10 MED ORDER — MIDAZOLAM HCL 2 MG/2ML IJ SOLN
INTRAMUSCULAR | Status: AC | PRN
  Administered 2020-03-10 (×2): 1 mg via INTRAVENOUS

## 2020-03-10 MED ORDER — FENTANYL CITRATE (PF) 100 MCG/2ML IJ SOLN
INTRAMUSCULAR | Status: AC | PRN
  Administered 2020-03-10 (×2): 50 ug via INTRAVENOUS

## 2020-03-10 MED ORDER — FENTANYL CITRATE (PF) 100 MCG/2ML IJ SOLN
INTRAMUSCULAR | Status: AC
  Filled 2020-03-10: qty 2

## 2020-03-10 MED ORDER — SODIUM CHLORIDE 0.9 % IV SOLN: INTRAVENOUS | Status: DC

## 2020-03-10 MED ORDER — LIDOCAINE HCL (PF) 1 % IJ SOLN
INTRAMUSCULAR | Status: AC | PRN
  Administered 2020-03-10: 10 mL

## 2020-03-10 NOTE — Discharge Instructions (Signed)

## 2020-03-10 NOTE — Procedures (Signed)
Pre procedural Dx: Retroperitoneal lymph node  Post procedural Dx: Same  Technically successful CT guided biopsy of indeterminate hypermetabolic left sided RP LN.    EBL: None.   Complications: None immediate.   Ronny Bacon, MD Pager #: [phone]
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
## 2020-03-14 LAB — SURGICAL PATHOLOGY

## 2020-03-17 ENCOUNTER — Other Ambulatory Visit: Payer: Self-pay | Admitting: Family Medicine

## 2020-03-28 ENCOUNTER — Encounter: Payer: Self-pay | Admitting: Hematology

## 2020-04-13 ENCOUNTER — Telehealth: Payer: Self-pay

## 2020-04-13 ENCOUNTER — Telehealth: Payer: Self-pay | Admitting: Hematology

## 2020-04-13 NOTE — Telephone Encounter (Signed)
SURGICAL PATHOLOGY  CASE: [id number]  PATIENT: [name]  Surgical Pathology Report      Clinical History: History of gastric adenocarcinoma and mantle cell  lymphoma, post with indeterminate hypermetabolic retroperitoneal  lymphadenopathy, post CT guided by of dominate left sided RP LN ([facility])      FINAL MICROSCOPIC DIAGNOSIS:   A. LYMPH NODE, LEFT RETROPERITONEAL, NEEDLE CORE BIOPSY:  - Metastatic adenocarcinoma.  - Mantle cell lymphoma.  - See comment.    -At patient's request I called and discussed the results of his lymph node biopsy with his daughter [name].  Lymph node biopsy shows both mantle cell lymphoma and metastatic gastric adenocarcinoma. Daughter reports that he is starting to have diarrhea and some dark stools. We discussed that we could get repeat labs and CT chest abdomen pelvis to evaluate pattern of disease progression.  The mantle cell lymphoma part would be more treatable and we could consider putting him on maintenance Rituxan that might control the mantle cell palliatively.  There also could be an option to consider ibrutinib if there is no bleeding issues.  Palliative chemotherapy for metastatic gastric cancer would be more involved and more likely to affect his quality of life. Currently she notes that he is enjoying a good quality of life has been working in the yard and eating well.  [name] she will be talking to her father [name] and getting back to [place] regarding his desire for repeat scans work-up and further management.  I also offered to have a in person or phone meeting with [name] and her and the rest of the family for further goals of care discussion to follow-up from multiple previous discussions as well.  [name] shall let us know regarding patient's desire to do further work-up and treatment or if he chooses to pursue best supportive cares.  [name]

## 2020-04-13 NOTE — Telephone Encounter (Signed)
Received call from pt's. daughter [name] to know results from his biopsy and planned therapy. She stated that he is having the same problems he did when he was first diagnosed with lymphoma and would like to know the next plan of action.  Per Dr. Irene Limbo: called and discussed results with patients daughter . She shall be calling about what patient/family wants to do next. thx GK

## 2020-04-17 ENCOUNTER — Telehealth: Payer: Self-pay | Admitting: Internal Medicine

## 2020-04-17 NOTE — Telephone Encounter (Signed)
Pt c/o swelling: STAT is pt has developed SOB within 24 hours  1) How much weight have you gained and in what time span? no  2) If swelling, where is the swelling located? Feet,legs and ankles  3) Are you currently taking a fluid pill? no  4) Are you currently SOB? No- no energy, blood pressure was 97 to 104 over 65 to 66- pt wants to be seen  5) Do you have a log of your daily weights (if so, list)?no  6) Have you gained 3 pounds in a day or 5 pounds in a week?no  7) Have you traveled recently? no*

## 2020-04-17 NOTE — Telephone Encounter (Signed)
Spoke with patient. Spoke with patient he has been having:  No energy, low BPs, 90s-100s/60s, lower extremities swollen from feet to knees, pitting No SOB, just tired, occas lightheaded but not today. Already took am meds. Reviewed medications. Amlodipine 2.5 mg  taken at night - adv pt to hold tonight Coreg 6.25 mg - adv to take 1/2 unless SBP <100 Entresto 49-51 mg - adv to take 1/2 unless SBP <100  apptite is fair, trying to stay well hydrated, yesterday nausea all day, no vomiting or diarrhea, recent weight loss  HR 80s, Pox RA is mid 90s. Has a dry cough which he's had and is improving  Added to Dr. Alan Ripper schedule tomorrow at 12:20 pm. Adv if symptoms worsen he should be seen in the ER.

## 2020-04-18 ENCOUNTER — Other Ambulatory Visit: Payer: Self-pay

## 2020-04-18 ENCOUNTER — Encounter: Payer: Self-pay | Admitting: Internal Medicine

## 2020-04-18 ENCOUNTER — Ambulatory Visit (INDEPENDENT_AMBULATORY_CARE_PROVIDER_SITE_OTHER): Payer: Medicare Other | Admitting: Internal Medicine

## 2020-04-18 ENCOUNTER — Telehealth: Payer: Self-pay | Admitting: Internal Medicine

## 2020-04-18 VITALS — BP 116/68 | HR 80 | Ht 72.0 in | Wt 194.0 lb

## 2020-04-18 DIAGNOSIS — I5042 Chronic combined systolic (congestive) and diastolic (congestive) heart failure: Secondary | ICD-10-CM

## 2020-04-18 MED ORDER — ENTRESTO 24-26 MG PO TABS: 1.0000 | ORAL_TABLET | Freq: Two times a day (BID) | ORAL | 11 refills | Status: DC

## 2020-04-18 MED ORDER — CARVEDILOL 3.125 MG PO TABS
3.1250 mg | ORAL_TABLET | Freq: Two times a day (BID) | ORAL | 3 refills | Status: DC
Start: 2020-04-18 — End: 2020-05-05

## 2020-04-18 NOTE — Telephone Encounter (Signed)
New Message:   Pt saw Dr Harrington Challenger today and was supposed to get lab work afterward. Wife called and wanted Dr Harrington Challenger to know that he did not get his lab work today. They were able to get blood for his lab work at our lab. When he went downstairs the line was so long, pt did not feel like waiting.

## 2020-04-18 NOTE — Patient Instructions (Addendum)
Medication Instructions:  Your physician has recommended you make the following change in your medication:  1.) stop amlodipine 2.) decrease carvedilol in half to 3.125 mg twice a day 3.) decrease Entresto (half) to 24-26 mg twice a day  *If you need a refill on your cardiac medications before your next appointment, please call your pharmacy*   Lab Work: Today: cbc, bmet, bnp (STAT)  If you have labs (blood work) drawn today and your tests are completely normal, you will receive your results only by: [name] MyChart Message (if you have MyChart) OR . A paper copy in the mail If you have any lab test that is abnormal or we need to change your treatment, we will call you to review the results.   Testing/Procedures:  Please try to schedule before next Dr. Harrington Challenger appointment on July 9 if possible. Your physician has requested that you have an echocardiogram. Echocardiography is a painless test that uses sound waves to create images of your heart. It provides your doctor with information about the size and shape of your heart and how well your heart's chambers and valves are working. This procedure takes approximately one hour. There are no restrictions for this procedure.   Follow-Up:  as scheduled today  Other Instructions
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
## 2020-04-18 NOTE — Telephone Encounter (Signed)
Spoke with the patient's wife who states that the patient was supposed to get lab work done after he saw Dr. Harrington Challenger today. The lab was only able to collect a little bit of blood so he was sent down to [facility] to have blood work done. The wife states that the patient got too tired of waiting so they left.

## 2020-04-19 ENCOUNTER — Other Ambulatory Visit: Payer: Self-pay | Admitting: *Deleted

## 2020-04-19 ENCOUNTER — Other Ambulatory Visit: Payer: Medicare Other | Admitting: *Deleted

## 2020-04-19 DIAGNOSIS — R609 Edema, unspecified: Secondary | ICD-10-CM

## 2020-04-19 DIAGNOSIS — I10 Essential (primary) hypertension: Secondary | ICD-10-CM | POA: Diagnosis not present

## 2020-04-19 DIAGNOSIS — D649 Anemia, unspecified: Secondary | ICD-10-CM

## 2020-04-19 DIAGNOSIS — R531 Weakness: Secondary | ICD-10-CM

## 2020-04-19 DIAGNOSIS — C169 Malignant neoplasm of stomach, unspecified: Secondary | ICD-10-CM | POA: Diagnosis not present

## 2020-04-19 DIAGNOSIS — R0602 Shortness of breath: Secondary | ICD-10-CM | POA: Diagnosis not present

## 2020-04-19 LAB — BASIC METABOLIC PANEL
BUN/Creatinine Ratio: 15 (ref 10–24)
BUN: 18 mg/dL (ref 8–27)
CO2: 21 mmol/L (ref 20–29)
Calcium: 7.8 mg/dL — ABNORMAL LOW (ref 8.6–10.2)
Chloride: 107 mmol/L — ABNORMAL HIGH (ref 96–106)
Creatinine, Ser: 1.23 mg/dL (ref 0.76–1.27)
GFR calc Af Amer: 61 mL/min/{1.73_m2} (ref >59)
GFR calc non Af Amer: 53 mL/min/{1.73_m2} — ABNORMAL LOW (ref >59)
Glucose: 81 mg/dL (ref 65–99)
Potassium: 4.4 mmol/L (ref 3.5–5.2)
Sodium: 138 mmol/L (ref 134–144)

## 2020-04-19 LAB — CBC WITH DIFFERENTIAL/PLATELET
Basophils Absolute: 0.1 10*3/uL (ref 0.0–0.2)
Basos: 1 %
EOS (ABSOLUTE): 0.6 10*3/uL — ABNORMAL HIGH (ref 0.0–0.4)
Eos: 8 %
Hematocrit: 43.3 % (ref 37.5–51.0)
Hemoglobin: 14.3 g/dL (ref 13.0–17.7)
Immature Grans (Abs): 0 10*3/uL (ref 0.0–0.1)
Immature Granulocytes: 0 %
Lymphocytes Absolute: 4.1 10*3/uL — ABNORMAL HIGH (ref 0.7–3.1)
Lymphs: 51 %
MCH: 34.3 pg — ABNORMAL HIGH (ref 26.6–33.0)
MCHC: 33 g/dL (ref 31.5–35.7)
MCV: 104 fL — ABNORMAL HIGH (ref 79–97)
Monocytes Absolute: 0.6 10*3/uL (ref 0.1–0.9)
Monocytes: 8 %
Neutrophils Absolute: 2.5 10*3/uL (ref 1.4–7.0)
Neutrophils: 32 %
Platelets: 172 10*3/uL (ref 150–450)
RBC: 4.17 x10E6/uL (ref 4.14–5.80)
RDW: 13.5 % (ref 11.6–15.4)
WBC: 7.9 10*3/uL (ref 3.4–10.8)

## 2020-04-19 LAB — PRO B NATRIURETIC PEPTIDE: NT-Pro BNP: 293 pg/mL (ref 0–486)

## 2020-04-19 MED ORDER — FUROSEMIDE 40 MG PO TABS
40.0000 mg | ORAL_TABLET | Freq: Every day | ORAL | 2 refills | Status: DC
Start: 2020-04-19 — End: 2020-05-09

## 2020-04-19 NOTE — Telephone Encounter (Signed)
I am very sorry that the patient had such difficulties Unfortunately I do not want to make adjustments in meds until I see lab results (need CBC , need to know renal function) Can he make appt with [facility]?  Is there someone at [facility] that could draw?

## 2020-04-19 NOTE — Telephone Encounter (Addendum)
Dr Harrington Challenger spoke with pt and pt is willing to come back in today for lab work Also Dr Harrington Challenger is starting Furosemide 40 mg every day Pending lab results pt may require K supplement ./cy

## 2020-04-19 NOTE — Addendum Note (Signed)
Addended by: Devra Dopp E on: 04/19/2020 09:22 AM   Modules accepted: Orders

## 2020-04-21 ENCOUNTER — Telehealth: Payer: Self-pay | Admitting: *Deleted

## 2020-04-21 DIAGNOSIS — R531 Weakness: Secondary | ICD-10-CM

## 2020-04-21 DIAGNOSIS — R609 Edema, unspecified: Secondary | ICD-10-CM

## 2020-04-21 MED ORDER — POTASSIUM CHLORIDE ER 10 MEQ PO TBCR
10.0000 meq | EXTENDED_RELEASE_TABLET | Freq: Every day | ORAL | 3 refills | Status: DC
Start: 2020-04-21 — End: 2020-05-09

## 2020-04-21 NOTE — Telephone Encounter (Signed)
-----   Message from Fay Records, MD sent at 04/20/2020  5:06 PM EDT ----- CBC is OK Electrolytes and kidney function are OK Fluid number not elevated I still think he has extra fluid with edema He is trying lasix daily   Confirm with 10 mEq KCL F/U BMET and liver panel in 10 days Weigh daily if possible Limit salt (2-2.5 gram) and fluid 1200-1500 cc max

## 2020-04-21 NOTE — Telephone Encounter (Signed)
Reviewed results w patient. He has picked up and started Lasix 40 mg yesterday and today. Both days it has made him dizzy.  Wears off in afternoon. I suggested for Sat and Sun to break in 1/2 and take 20 mg in am and 20 mg about 6 hours later to see if this helps. He will pick up and begin potassium 10 meq.  Weighs daily and already limits sodium. Will limit fluids to 40-50 ounces daily.  He is fatigued, run down today.  I told him I would review with Dr. Harrington Challenger and call him Monday to see if he is still getting dizzy after splitting lasix in half.  Will schedule lab appointment when we talk on Monday.  Needs to be a day when Lanny Hurst is here.

## 2020-04-24 ENCOUNTER — Telehealth: Payer: Self-pay | Admitting: Internal Medicine

## 2020-04-24 NOTE — Telephone Encounter (Signed)
Patient continues lasix 40 mg every am.  He will discontinue Entresto for now to see if dizziness improves. His weight is the same - 188 and not much change in swelling.  Has echo tomorrow. Labs 7/7 and ov with PR on 7/9.

## 2020-04-24 NOTE — Addendum Note (Signed)
Addended by: Rodman Key on: 04/24/2020 12:55 PM   Modules accepted: Orders

## 2020-04-24 NOTE — Telephone Encounter (Signed)
See telephone encounter dated 04/24/20 for further details.

## 2020-04-24 NOTE — Telephone Encounter (Signed)
Pt said he was dizzy with lasix     REcomm: Hold / stop entresto Continue lasix 40 mg in AM   Follow response

## 2020-04-25 ENCOUNTER — Ambulatory Visit (HOSPITAL_COMMUNITY)
Admission: RE | Admit: 2020-04-25 | Discharge: 2020-04-25 | Disposition: A | Discharge status: Home / Self Care | Payer: Medicare Other | Source: Ambulatory Visit | Attending: Internal Medicine | Admitting: Internal Medicine

## 2020-04-25 ENCOUNTER — Other Ambulatory Visit: Payer: Self-pay

## 2020-04-25 DIAGNOSIS — I5042 Chronic combined systolic (congestive) and diastolic (congestive) heart failure: Secondary | ICD-10-CM

## 2020-04-25 DIAGNOSIS — E785 Hyperlipidemia, unspecified: Secondary | ICD-10-CM | POA: Insufficient documentation

## 2020-04-25 DIAGNOSIS — I351 Nonrheumatic aortic (valve) insufficiency: Secondary | ICD-10-CM | POA: Diagnosis not present

## 2020-04-25 DIAGNOSIS — Z859 Personal history of malignant neoplasm, unspecified: Secondary | ICD-10-CM | POA: Diagnosis not present

## 2020-04-25 DIAGNOSIS — K219 Gastro-esophageal reflux disease without esophagitis: Secondary | ICD-10-CM | POA: Diagnosis not present

## 2020-04-25 DIAGNOSIS — I110 Hypertensive heart disease with heart failure: Secondary | ICD-10-CM | POA: Insufficient documentation

## 2020-04-25 NOTE — Progress Notes (Signed)
  Echocardiogram 2D Echocardiogram has been performed.  [name] 04/25/2020, 3:35 PM

## 2020-04-26 ENCOUNTER — Telehealth: Payer: Self-pay | Admitting: Internal Medicine

## 2020-04-26 NOTE — Telephone Encounter (Signed)
Patient has had runny loose stools today.  He feels it could be caused by the potassium he just started.  Takes in evening.  Will hold that tonight.  Adv to hold lasix while having runny frequent stools.  Asked him to move up his lab appointment from 7/7 to this Friday, 7/2.    He will call back tomorrow with update.

## 2020-04-26 NOTE — Telephone Encounter (Signed)
Pt c/o medication issue:  1. Name of Medication:  potassium chloride (KLOR-CON) 10 MEQ tablet furosemide (LASIX) 40 MG tablet  2. How are you currently taking this medication (dosage and times per day)? 1 tablet a day  3. Are you having a reaction (difficulty breathing--STAT)? no  4. What is your medication issue? Patient states he started taking the potassium and he is having very bad diarrhea. He states he is not sure if it is caused by the medications.

## 2020-04-27 NOTE — Telephone Encounter (Signed)
Lab appointment rescheduled for 04/28/20.  Pt has follow up with Dr. Harrington Challenger on 05/05/20.

## 2020-04-27 NOTE — Telephone Encounter (Signed)
Agree with recomm to hold KCL   and also to hold lasix when having loose bowel movements Agreewith labs on 7/2

## 2020-04-28 ENCOUNTER — Other Ambulatory Visit: Payer: Self-pay

## 2020-04-28 ENCOUNTER — Other Ambulatory Visit: Payer: Medicare Other | Admitting: *Deleted

## 2020-04-28 DIAGNOSIS — R531 Weakness: Secondary | ICD-10-CM

## 2020-04-28 DIAGNOSIS — R609 Edema, unspecified: Secondary | ICD-10-CM

## 2020-04-28 LAB — BASIC METABOLIC PANEL
BUN/Creatinine Ratio: 13 (ref 10–24)
BUN: 17 mg/dL (ref 8–27)
CO2: 25 mmol/L (ref 20–29)
Calcium: 7.5 mg/dL — ABNORMAL LOW (ref 8.6–10.2)
Chloride: 103 mmol/L (ref 96–106)
Creatinine, Ser: 1.34 mg/dL — ABNORMAL HIGH (ref 0.76–1.27)
GFR calc Af Amer: 55 mL/min/{1.73_m2} — ABNORMAL LOW (ref >59)
GFR calc non Af Amer: 48 mL/min/{1.73_m2} — ABNORMAL LOW (ref >59)
Glucose: 83 mg/dL (ref 65–99)
Potassium: 3.6 mmol/L (ref 3.5–5.2)
Sodium: 137 mmol/L (ref 134–144)

## 2020-04-28 LAB — HEPATIC FUNCTION PANEL
ALT: 13 IU/L (ref 0–44)
AST: 17 IU/L (ref 0–40)
Albumin: 2.4 g/dL — ABNORMAL LOW (ref 3.6–4.6)
Alkaline Phosphatase: 83 IU/L (ref 48–121)
Bilirubin Total: 0.5 mg/dL (ref 0.0–1.2)
Bilirubin, Direct: 0.19 mg/dL (ref 0.00–0.40)
Total Protein: 3.8 g/dL — CL (ref 6.0–8.5)

## 2020-05-03 ENCOUNTER — Other Ambulatory Visit: Payer: Medicare Other

## 2020-05-05 ENCOUNTER — Telehealth: Payer: Self-pay | Admitting: Hematology

## 2020-05-05 ENCOUNTER — Telehealth: Payer: Self-pay | Admitting: Internal Medicine

## 2020-05-05 ENCOUNTER — Telehealth: Payer: Self-pay | Admitting: *Deleted

## 2020-05-05 ENCOUNTER — Other Ambulatory Visit: Payer: Self-pay

## 2020-05-05 ENCOUNTER — Other Ambulatory Visit: Payer: Self-pay | Admitting: Hematology

## 2020-05-05 ENCOUNTER — Ambulatory Visit (INDEPENDENT_AMBULATORY_CARE_PROVIDER_SITE_OTHER): Payer: Medicare Other | Admitting: Internal Medicine

## 2020-05-05 ENCOUNTER — Encounter: Payer: Self-pay | Admitting: Internal Medicine

## 2020-05-05 VITALS — BP 84/62 | HR 75 | Ht 72.0 in | Wt 182.0 lb

## 2020-05-05 DIAGNOSIS — I5042 Chronic combined systolic (congestive) and diastolic (congestive) heart failure: Secondary | ICD-10-CM | POA: Diagnosis not present

## 2020-05-05 DIAGNOSIS — R609 Edema, unspecified: Secondary | ICD-10-CM | POA: Diagnosis not present

## 2020-05-05 DIAGNOSIS — I10 Essential (primary) hypertension: Secondary | ICD-10-CM

## 2020-05-05 DIAGNOSIS — C169 Malignant neoplasm of stomach, unspecified: Secondary | ICD-10-CM

## 2020-05-05 DIAGNOSIS — C8318 Mantle cell lymphoma, lymph nodes of multiple sites: Secondary | ICD-10-CM

## 2020-05-05 MED ORDER — SPIRONOLACTONE 25 MG PO TABS
25.0000 mg | ORAL_TABLET | Freq: Every day | ORAL | 11 refills | Status: DC
Start: 2020-05-05 — End: 2020-05-09
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
## 2020-05-05 NOTE — Telephone Encounter (Signed)
Patient's daughter, [name], called to advise that during patients appointment today at 70 w/ Dr. Harrington Challenger, she is requesting Dr. Harrington Challenger address issues the patient is having with depression.

## 2020-05-05 NOTE — Patient Instructions (Signed)
Medication Instructions:  Your physician has recommended you make the following change in your medication:  STOP Carvedilol (Coreg) START Spironolactone (Aldactone) 25 mg once daily  *If you need a refill on your cardiac medications before your next appointment, please call your pharmacy*   Lab Work: TODAY - pro BNP, BMET If you have labs (blood work) drawn today and your tests are completely normal, you will receive your results only by: [name] MyChart Message (if you have MyChart) OR . A paper copy in the mail If you have any lab test that is abnormal or we need to change your treatment, we will call you to review the results.   Testing/Procedures: None Ordered   Follow-Up: At [facility], you and your health needs are our priority.  As part of our continuing mission to provide you with exceptional heart care, we have created designated Provider Care Teams.  These Care Teams include your primary Cardiologist (physician) and Advanced Practice Providers (APPs -  Physician Assistants and Nurse Practitioners) who all work together to provide you with the care you need, when you need it.    Your next appointment:    To Be Determined after lab work  The format for your next appointment:   In Person  Provider:   You may see Dorris Carnes, MD or one of the following Advanced Practice Providers on your designated Care Team:    Richardson Dopp, PA-C  [place], [place]

## 2020-05-05 NOTE — Telephone Encounter (Signed)
Daughter - [name] called: States a CT for her father was discussed last week with Dr. Irene Limbo by phone. [name] would like to proceed with CT. Dr. Irene Limbo informed. Dr. Grier Mitts response: "PET/CT ordered for 1 weeks out and clinic f/u with me in 2 weeks". PET/CT scheduled for 7/21. Schedule messages sent to scheduled MD appt 2 weeks after PET. Contacted daughter with this information - she verbalized understanding

## 2020-05-05 NOTE — Telephone Encounter (Signed)
Ok to speak with daughter, [name] per [place]. Left detailed message that I have received her message and am passing it on to Dr. Harrington Challenger. I advised her to call back if needed.

## 2020-05-05 NOTE — Telephone Encounter (Signed)
Scheduled appt per 7/9 sch msg - pt is aware of appt date and time.

## 2020-05-06 LAB — BASIC METABOLIC PANEL
BUN/Creatinine Ratio: 16 (ref 10–24)
BUN: 25 mg/dL (ref 8–27)
CO2: 23 mmol/L (ref 20–29)
Calcium: 7.7 mg/dL — ABNORMAL LOW (ref 8.6–10.2)
Chloride: 103 mmol/L (ref 96–106)
Creatinine, Ser: 1.59 mg/dL — ABNORMAL HIGH (ref 0.76–1.27)
GFR calc Af Amer: 45 mL/min/{1.73_m2} — ABNORMAL LOW (ref >59)
GFR calc non Af Amer: 39 mL/min/{1.73_m2} — ABNORMAL LOW (ref >59)
Glucose: 91 mg/dL (ref 65–99)
Potassium: 3.6 mmol/L (ref 3.5–5.2)
Sodium: 138 mmol/L (ref 134–144)

## 2020-05-06 LAB — PRO B NATRIURETIC PEPTIDE: NT-Pro BNP: 320 pg/mL (ref 0–486)

## 2020-05-09 ENCOUNTER — Telehealth: Payer: Self-pay | Admitting: *Deleted

## 2020-05-09 DIAGNOSIS — I5042 Chronic combined systolic (congestive) and diastolic (congestive) heart failure: Secondary | ICD-10-CM

## 2020-05-09 MED ORDER — SPIRONOLACTONE 50 MG PO TABS
50.0000 mg | ORAL_TABLET | Freq: Every day | ORAL | 3 refills | Status: DC
Start: 2020-05-09 — End: 2020-05-25

## 2020-05-09 MED ORDER — FUROSEMIDE 40 MG PO TABS
40.0000 mg | ORAL_TABLET | ORAL | 3 refills | Status: DC
Start: 2020-05-09 — End: 2020-05-25

## 2020-05-09 NOTE — Telephone Encounter (Signed)
-----   Message from Fay Records, MD sent at 05/08/2020  9:30 AM EDT ----- Spoke with pt and wife  REcomm: 1.  INcrease spironolactone to 50 mg daily 2  Take lasix every other day 40 mg  REcomm BMET  in 1 wk   He is not on Potassium due to stomach problesms  Spoke to wife to confirm.  Please call pt again

## 2020-05-09 NOTE — Telephone Encounter (Signed)
Called and spoke with patient.  He verbalizes understanding of medication changes that are recommended.  He is not on potassium.  He has appointment at radiology at [facility] 7/21 and will have labs drawn at [facility] at that time.

## 2020-05-16 ENCOUNTER — Other Ambulatory Visit: Payer: Medicare Other

## 2020-05-16 ENCOUNTER — Other Ambulatory Visit: Payer: Self-pay | Admitting: *Deleted

## 2020-05-16 DIAGNOSIS — C8318 Mantle cell lymphoma, lymph nodes of multiple sites: Secondary | ICD-10-CM

## 2020-05-17 ENCOUNTER — Other Ambulatory Visit: Payer: Self-pay

## 2020-05-17 ENCOUNTER — Inpatient Hospital Stay: Payer: Medicare Other | Attending: Hematology

## 2020-05-17 ENCOUNTER — Ambulatory Visit (HOSPITAL_COMMUNITY)
Admission: RE | Admit: 2020-05-17 | Discharge: 2020-05-17 | Disposition: A | Discharge status: Home / Self Care | Payer: Medicare Other | Source: Ambulatory Visit | Attending: Hematology | Admitting: Hematology

## 2020-05-17 DIAGNOSIS — C169 Malignant neoplasm of stomach, unspecified: Secondary | ICD-10-CM | POA: Diagnosis not present

## 2020-05-17 DIAGNOSIS — N400 Enlarged prostate without lower urinary tract symptoms: Secondary | ICD-10-CM | POA: Insufficient documentation

## 2020-05-17 DIAGNOSIS — C8318 Mantle cell lymphoma, lymph nodes of multiple sites: Secondary | ICD-10-CM | POA: Insufficient documentation

## 2020-05-17 DIAGNOSIS — C8310 Mantle cell lymphoma, unspecified site: Secondary | ICD-10-CM | POA: Diagnosis not present

## 2020-05-17 DIAGNOSIS — I700 Atherosclerosis of aorta: Secondary | ICD-10-CM | POA: Diagnosis not present

## 2020-05-17 DIAGNOSIS — I2510 Atherosclerotic heart disease of native coronary artery without angina pectoris: Secondary | ICD-10-CM | POA: Insufficient documentation

## 2020-05-17 LAB — BASIC METABOLIC PANEL - CANCER CENTER ONLY
Anion gap: 10 (ref 5–15)
BUN: 27 mg/dL — ABNORMAL HIGH (ref 8–23)
CO2: 19 mmol/L — ABNORMAL LOW (ref 22–32)
Calcium: 7.7 mg/dL — ABNORMAL LOW (ref 8.9–10.3)
Chloride: 107 mmol/L (ref 98–111)
Creatinine: 1.68 mg/dL — ABNORMAL HIGH (ref 0.61–1.24)
GFR, Est AFR Am: 42 mL/min — ABNORMAL LOW (ref >60)
GFR, Estimated: 36 mL/min — ABNORMAL LOW (ref >60)
Glucose, Bld: 95 mg/dL (ref 70–99)
Potassium: 4.5 mmol/L (ref 3.5–5.1)
Sodium: 136 mmol/L (ref 135–145)

## 2020-05-17 LAB — GLUCOSE, CAPILLARY: Glucose-Capillary: 93 mg/dL (ref 70–99)

## 2020-05-17 MED ORDER — FLUDEOXYGLUCOSE F - 18 (FDG) INJECTION
9.0800 | Freq: Once | INTRAVENOUS | Status: AC | PRN
  Administered 2020-05-17: 9.08 via INTRAVENOUS

## 2020-05-18 ENCOUNTER — Telehealth: Payer: Self-pay | Admitting: Internal Medicine

## 2020-05-18 NOTE — Telephone Encounter (Signed)
Will send message to Dr. Harrington Challenger to review labs drawn at [facility] yesterday.

## 2020-05-18 NOTE — Telephone Encounter (Signed)
New Message   Patients wife is calling on his behalf. She states that he had some lab work done and yesterday and was calling for the results. Please call discuss.

## 2020-05-19 NOTE — Telephone Encounter (Signed)
These were the labs that were to be done at [facility] but for ease of pt were drawn at cancer center while he was already going to be there.

## 2020-05-22 ENCOUNTER — Telehealth: Payer: Self-pay | Admitting: Internal Medicine

## 2020-05-22 NOTE — Telephone Encounter (Signed)
bp low 100/70-s80s.  Still dizzy even w improved BPs. Was just when he go up but now it is at rest also.  Weak.  SOB, no wheezing.  O2 Sats are normal.   Now also he nauseated and throwing up, not as much diarrhea.  She is trying to get him to drink fluids but not a lot.  Eating soup once a day.   She is asking if Dr. Harrington Challenger thinks any of these symptoms are being caused from his heart.  Thinks dizzyiness got worse w increasing spironolactone.   She is going to call Dr. Grier Mitts office tomorrow.  His follow up there is 2 weeks away.  She is aware I am forwarding to Dr. Harrington Challenger for further input.

## 2020-05-22 NOTE — Telephone Encounter (Signed)
  STAT if patient feels like he/she is going to faint   1) Are you dizzy now? Yes. Pt is dizzy all the time   2) Do you feel faint or have you passed out? no  3) Do you have any other symptoms? Swelling in his L hand   Have you checked your HR and BP (record if available)? Normal 111/80  Pt c/o Shortness Of Breath: STAT if SOB developed within the last 24 hours or pt is noticeably SOB on the phone  1. Are you currently SOB (can you hear that pt is SOB on the phone)? Daughter is not with   2. How long have you been experiencing SOB? 2-3 weeks   3. Are you SOB when sitting or when up moving around? All the time   4. Are you currently experiencing any other symptoms?  Swelling in his L hand and hand feels cold.   Daughter of the patient called. Daughter  wanted to know if the patent's symptoms are related to his heart issue. The patient had a scan done last week and wanted to have Dr. Harrington Challenger interpret those results. She also wanted to follow up and pursue a home health referral to have a nurse come to the house.

## 2020-05-23 NOTE — Telephone Encounter (Signed)
Pt had labs 6 days ago Can he get another BMET, BNP and CBC this week?

## 2020-05-24 ENCOUNTER — Other Ambulatory Visit: Payer: Self-pay

## 2020-05-24 ENCOUNTER — Observation Stay (HOSPITAL_COMMUNITY): Payer: Medicare Other

## 2020-05-24 ENCOUNTER — Inpatient Hospital Stay (HOSPITAL_COMMUNITY)
Admission: EM | Admit: 2020-05-24 | Discharge: 2020-05-31 | DRG: 070 | Disposition: A | Discharge status: Skilled Nursing Facility | Payer: Medicare Other | Attending: Family Medicine | Admitting: Family Medicine

## 2020-05-24 ENCOUNTER — Encounter (HOSPITAL_COMMUNITY): Payer: Self-pay

## 2020-05-24 ENCOUNTER — Emergency Department (HOSPITAL_COMMUNITY): Payer: Medicare Other

## 2020-05-24 DIAGNOSIS — G9341 Metabolic encephalopathy: Secondary | ICD-10-CM | POA: Diagnosis not present

## 2020-05-24 DIAGNOSIS — E43 Unspecified severe protein-calorie malnutrition: Secondary | ICD-10-CM | POA: Diagnosis present

## 2020-05-24 DIAGNOSIS — N179 Acute kidney failure, unspecified: Secondary | ICD-10-CM | POA: Diagnosis not present

## 2020-05-24 DIAGNOSIS — C169 Malignant neoplasm of stomach, unspecified: Secondary | ICD-10-CM

## 2020-05-24 DIAGNOSIS — I2510 Atherosclerotic heart disease of native coronary artery without angina pectoris: Secondary | ICD-10-CM | POA: Diagnosis not present

## 2020-05-24 DIAGNOSIS — G9340 Encephalopathy, unspecified: Secondary | ICD-10-CM | POA: Diagnosis present

## 2020-05-24 DIAGNOSIS — C8310 Mantle cell lymphoma, unspecified site: Secondary | ICD-10-CM | POA: Diagnosis not present

## 2020-05-24 DIAGNOSIS — E875 Hyperkalemia: Secondary | ICD-10-CM | POA: Diagnosis present

## 2020-05-24 DIAGNOSIS — C8318 Mantle cell lymphoma, lymph nodes of multiple sites: Secondary | ICD-10-CM | POA: Diagnosis present

## 2020-05-24 DIAGNOSIS — Z6824 Body mass index (BMI) 24.0-24.9, adult: Secondary | ICD-10-CM

## 2020-05-24 DIAGNOSIS — Z20822 Contact with and (suspected) exposure to covid-19: Secondary | ICD-10-CM | POA: Diagnosis present

## 2020-05-24 DIAGNOSIS — N1831 Chronic kidney disease, stage 3a: Secondary | ICD-10-CM | POA: Diagnosis not present

## 2020-05-24 DIAGNOSIS — R6889 Other general symptoms and signs: Secondary | ICD-10-CM | POA: Diagnosis not present

## 2020-05-24 DIAGNOSIS — I452 Bifascicular block: Secondary | ICD-10-CM | POA: Diagnosis present

## 2020-05-24 DIAGNOSIS — Z85828 Personal history of other malignant neoplasm of skin: Secondary | ICD-10-CM

## 2020-05-24 DIAGNOSIS — F418 Other specified anxiety disorders: Secondary | ICD-10-CM

## 2020-05-24 DIAGNOSIS — Z515 Encounter for palliative care: Secondary | ICD-10-CM | POA: Diagnosis not present

## 2020-05-24 DIAGNOSIS — H9190 Unspecified hearing loss, unspecified ear: Secondary | ICD-10-CM | POA: Diagnosis present

## 2020-05-24 DIAGNOSIS — R410 Disorientation, unspecified: Secondary | ICD-10-CM | POA: Diagnosis not present

## 2020-05-24 DIAGNOSIS — C162 Malignant neoplasm of body of stomach: Secondary | ICD-10-CM | POA: Diagnosis not present

## 2020-05-24 DIAGNOSIS — Z7189 Other specified counseling: Secondary | ICD-10-CM

## 2020-05-24 DIAGNOSIS — Z79899 Other long term (current) drug therapy: Secondary | ICD-10-CM

## 2020-05-24 DIAGNOSIS — I5042 Chronic combined systolic (congestive) and diastolic (congestive) heart failure: Secondary | ICD-10-CM | POA: Diagnosis not present

## 2020-05-24 DIAGNOSIS — Z66 Do not resuscitate: Secondary | ICD-10-CM | POA: Diagnosis present

## 2020-05-24 DIAGNOSIS — K219 Gastro-esophageal reflux disease without esophagitis: Secondary | ICD-10-CM | POA: Diagnosis present

## 2020-05-24 DIAGNOSIS — F419 Anxiety disorder, unspecified: Secondary | ICD-10-CM | POA: Diagnosis present

## 2020-05-24 DIAGNOSIS — R4182 Altered mental status, unspecified: Secondary | ICD-10-CM | POA: Diagnosis not present

## 2020-05-24 DIAGNOSIS — N261 Atrophy of kidney (terminal): Secondary | ICD-10-CM | POA: Diagnosis not present

## 2020-05-24 DIAGNOSIS — I129 Hypertensive chronic kidney disease with stage 1 through stage 4 chronic kidney disease, or unspecified chronic kidney disease: Secondary | ICD-10-CM | POA: Diagnosis not present

## 2020-05-24 DIAGNOSIS — Z9221 Personal history of antineoplastic chemotherapy: Secondary | ICD-10-CM

## 2020-05-24 DIAGNOSIS — Z820 Family history of epilepsy and other diseases of the nervous system: Secondary | ICD-10-CM

## 2020-05-24 DIAGNOSIS — I951 Orthostatic hypotension: Secondary | ICD-10-CM | POA: Diagnosis present

## 2020-05-24 DIAGNOSIS — E785 Hyperlipidemia, unspecified: Secondary | ICD-10-CM | POA: Diagnosis present

## 2020-05-24 DIAGNOSIS — M797 Fibromyalgia: Secondary | ICD-10-CM | POA: Diagnosis present

## 2020-05-24 DIAGNOSIS — Z801 Family history of malignant neoplasm of trachea, bronchus and lung: Secondary | ICD-10-CM

## 2020-05-24 DIAGNOSIS — Z8249 Family history of ischemic heart disease and other diseases of the circulatory system: Secondary | ICD-10-CM

## 2020-05-24 DIAGNOSIS — R404 Transient alteration of awareness: Secondary | ICD-10-CM | POA: Diagnosis not present

## 2020-05-24 DIAGNOSIS — R0689 Other abnormalities of breathing: Secondary | ICD-10-CM | POA: Diagnosis not present

## 2020-05-24 DIAGNOSIS — R4589 Other symptoms and signs involving emotional state: Secondary | ICD-10-CM

## 2020-05-24 DIAGNOSIS — Z743 Need for continuous supervision: Secondary | ICD-10-CM | POA: Diagnosis not present

## 2020-05-24 DIAGNOSIS — C8299 Follicular lymphoma, unspecified, extranodal and solid organ sites: Secondary | ICD-10-CM

## 2020-05-24 DIAGNOSIS — I130 Hypertensive heart and chronic kidney disease with heart failure and stage 1 through stage 4 chronic kidney disease, or unspecified chronic kidney disease: Secondary | ICD-10-CM | POA: Diagnosis present

## 2020-05-24 DIAGNOSIS — E46 Unspecified protein-calorie malnutrition: Secondary | ICD-10-CM | POA: Diagnosis present

## 2020-05-24 DIAGNOSIS — Z85028 Personal history of other malignant neoplasm of stomach: Secondary | ICD-10-CM

## 2020-05-24 DIAGNOSIS — Z888 Allergy status to other drugs, medicaments and biological substances status: Secondary | ICD-10-CM

## 2020-05-24 DIAGNOSIS — Z803 Family history of malignant neoplasm of breast: Secondary | ICD-10-CM

## 2020-05-24 LAB — URINALYSIS, ROUTINE W REFLEX MICROSCOPIC
Bilirubin Urine: NEGATIVE
Glucose, UA: NEGATIVE mg/dL
Hgb urine dipstick: NEGATIVE
Ketones, ur: 5 mg/dL — AB
Leukocytes,Ua: NEGATIVE
Nitrite: NEGATIVE
Protein, ur: NEGATIVE mg/dL
Specific Gravity, Urine: 1.016 (ref 1.005–1.030)
pH: 6 (ref 5.0–8.0)

## 2020-05-24 LAB — CBC WITH DIFFERENTIAL/PLATELET
Abs Immature Granulocytes: 0.04 10*3/uL (ref 0.00–0.07)
Basophils Absolute: 0 10*3/uL (ref 0.0–0.1)
Basophils Relative: 0 %
Eosinophils Absolute: 0.4 10*3/uL (ref 0.0–0.5)
Eosinophils Relative: 4 %
HCT: 44.5 % (ref 39.0–52.0)
Hemoglobin: 15.1 g/dL (ref 13.0–17.0)
Immature Granulocytes: 0 %
Lymphocytes Relative: 51 %
Lymphs Abs: 4.6 10*3/uL — ABNORMAL HIGH (ref 0.7–4.0)
MCH: 33.6 pg (ref 26.0–34.0)
MCHC: 33.9 g/dL (ref 30.0–36.0)
MCV: 99.1 fL (ref 80.0–100.0)
Monocytes Absolute: 0.7 10*3/uL (ref 0.1–1.0)
Monocytes Relative: 8 %
Neutro Abs: 3.3 10*3/uL (ref 1.7–7.7)
Neutrophils Relative %: 37 %
Platelets: 191 10*3/uL (ref 150–400)
RBC: 4.49 MIL/uL (ref 4.22–5.81)
RDW: 15.5 % (ref 11.5–15.5)
WBC: 9 10*3/uL (ref 4.0–10.5)
nRBC: 0.4 % — ABNORMAL HIGH (ref 0.0–0.2)

## 2020-05-24 LAB — COMPREHENSIVE METABOLIC PANEL
ALT: 18 U/L (ref 0–44)
AST: 34 U/L (ref 15–41)
Albumin: 1.5 g/dL — ABNORMAL LOW (ref 3.5–5.0)
Alkaline Phosphatase: 58 U/L (ref 38–126)
Anion gap: 7 (ref 5–15)
BUN: 26 mg/dL — ABNORMAL HIGH (ref 8–23)
CO2: 23 mmol/L (ref 22–32)
Calcium: 7.3 mg/dL — ABNORMAL LOW (ref 8.9–10.3)
Chloride: 106 mmol/L (ref 98–111)
Creatinine, Ser: 1.97 mg/dL — ABNORMAL HIGH (ref 0.61–1.24)
GFR calc Af Amer: 35 mL/min — ABNORMAL LOW (ref >60)
GFR calc non Af Amer: 30 mL/min — ABNORMAL LOW (ref >60)
Glucose, Bld: 96 mg/dL (ref 70–99)
Potassium: 5.7 mmol/L — ABNORMAL HIGH (ref 3.5–5.1)
Sodium: 136 mmol/L (ref 135–145)
Total Bilirubin: 1.5 mg/dL — ABNORMAL HIGH (ref 0.3–1.2)
Total Protein: <3 g/dL — ABNORMAL LOW (ref 6.5–8.1)

## 2020-05-24 LAB — LACTIC ACID, PLASMA: Lactic Acid, Venous: 1.6 mmol/L (ref 0.5–1.9)

## 2020-05-24 LAB — CREATININE, URINE, RANDOM: Creatinine, Urine: 144.46 mg/dL

## 2020-05-24 LAB — ETHANOL: Alcohol, Ethyl (B): <10 mg/dL (ref <10)

## 2020-05-24 LAB — SARS CORONAVIRUS 2 BY RT PCR (HOSPITAL ORDER, PERFORMED IN ~~LOC~~ HOSPITAL LAB): SARS Coronavirus 2: NEGATIVE

## 2020-05-24 LAB — TSH: TSH: 6.886 u[IU]/mL — ABNORMAL HIGH (ref 0.350–4.500)

## 2020-05-24 LAB — VITAMIN B12: Vitamin B-12: 776 pg/mL (ref 180–914)

## 2020-05-24 LAB — FOLATE: Folate: 11.2 ng/mL (ref >5.9)

## 2020-05-24 LAB — SODIUM, URINE, RANDOM: Sodium, Ur: 51 mmol/L

## 2020-05-24 LAB — AMMONIA: Ammonia: 30 umol/L (ref 9–35)

## 2020-05-24 LAB — HEMOGLOBIN A1C
Hgb A1c MFr Bld: 5.4 % (ref 4.8–5.6)
Mean Plasma Glucose: 108.28 mg/dL

## 2020-05-24 MED ORDER — LACTATED RINGERS IV BOLUS: 1000.0000 mL | Freq: Once | INTRAVENOUS | Status: DC

## 2020-05-24 MED ORDER — SODIUM CHLORIDE 0.9% FLUSH
3.0000 mL | Freq: Two times a day (BID) | INTRAVENOUS | Status: DC
  Administered 2020-05-24 – 2020-05-30 (×7): 3 mL via INTRAVENOUS

## 2020-05-24 MED ORDER — ROSUVASTATIN CALCIUM 5 MG PO TABS
10.0000 mg | ORAL_TABLET | Freq: Every day | ORAL | Status: DC
  Administered 2020-05-24: 10 mg via ORAL
  Filled 2020-05-24: qty 2

## 2020-05-24 MED ORDER — PANTOPRAZOLE SODIUM 40 MG PO TBEC
40.0000 mg | DELAYED_RELEASE_TABLET | Freq: Every day | ORAL | Status: DC
  Administered 2020-05-24 – 2020-05-31 (×7): 40 mg via ORAL
  Filled 2020-05-24 (×7): qty 1

## 2020-05-24 MED ORDER — SODIUM CHLORIDE 0.9 % IV SOLN: 250.0000 mL | INTRAVENOUS | Status: DC | PRN

## 2020-05-24 MED ORDER — SENNOSIDES-DOCUSATE SODIUM 8.6-50 MG PO TABS: 1.0000 | ORAL_TABLET | Freq: Every evening | ORAL | Status: DC | PRN

## 2020-05-24 MED ORDER — ONDANSETRON HCL 4 MG/2ML IJ SOLN
4.0000 mg | Freq: Four times a day (QID) | INTRAMUSCULAR | Status: DC | PRN
  Administered 2020-05-24 – 2020-05-29 (×6): 4 mg via INTRAVENOUS
  Filled 2020-05-24 (×7): qty 2

## 2020-05-24 MED ORDER — ACETAMINOPHEN 650 MG RE SUPP: 650.0000 mg | Freq: Four times a day (QID) | RECTAL | Status: DC | PRN

## 2020-05-24 MED ORDER — SODIUM CHLORIDE 0.9% FLUSH
3.0000 mL | Freq: Two times a day (BID) | INTRAVENOUS | Status: DC
  Administered 2020-05-24 – 2020-05-27 (×5): 3 mL via INTRAVENOUS

## 2020-05-24 MED ORDER — SODIUM CHLORIDE 0.9% FLUSH
3.0000 mL | INTRAVENOUS | Status: DC | PRN
  Administered 2020-05-24: 3 mL via INTRAVENOUS

## 2020-05-24 MED ORDER — ONDANSETRON HCL 4 MG PO TABS
4.0000 mg | ORAL_TABLET | Freq: Four times a day (QID) | ORAL | Status: DC | PRN
  Administered 2020-05-26 – 2020-05-29 (×2): 4 mg via ORAL
  Filled 2020-05-24 (×2): qty 1

## 2020-05-24 MED ORDER — ACETAMINOPHEN 325 MG PO TABS
650.0000 mg | ORAL_TABLET | Freq: Four times a day (QID) | ORAL | Status: DC | PRN
  Administered 2020-05-30: 650 mg via ORAL
  Filled 2020-05-24: qty 2

## 2020-05-24 MED ORDER — HEPARIN SODIUM (PORCINE) 5000 UNIT/ML IJ SOLN
5000.0000 [IU] | Freq: Three times a day (TID) | INTRAMUSCULAR | Status: DC
  Administered 2020-05-24 – 2020-05-30 (×18): 5000 [IU] via SUBCUTANEOUS
  Filled 2020-05-24 (×18): qty 1

## 2020-05-24 NOTE — ED Notes (Signed)
Tele   Breakfast ordered  
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
## 2020-05-24 NOTE — ED Notes (Signed)
Pt transported to US via stretcher.  

## 2020-05-24 NOTE — ED Notes (Signed)
IV access attempted multiple times, unsuccessfully. IV team consult placed

## 2020-05-24 NOTE — Progress Notes (Signed)
BP 89/65, paged Dr. Louanne Belton

## 2020-05-24 NOTE — Telephone Encounter (Signed)
Patient at [facility], by EMS overnight.
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
## 2020-05-24 NOTE — ED Notes (Signed)
IV team unable to collect blood cultures. Will ask phlebotomy to collect

## 2020-05-24 NOTE — Evaluation (Signed)
Physical Therapy Evaluation Patient Details Name: Noelle Hoogland. MRN: [id number] DOB: June 07, 1933 Today's Date: 05/24/2020   History of Present Illness  Pt is an 87 y/o male admitted secondary to acute encephalopathy, likely from AKI. CT negative for acute abnormality. PMH includes CAD, CKD, CHF, gastric cancer, and HTN.   Clinical Impression  Pt admitted secondary to problem above with deficits below. Pt requiring min guard A and HHA for mobility tasks within the room. Pt feeling very nauseous, so mobility limited. Anticipate pt will progress well once feeling better. Feel he would benefit from [place] services. Will continue to follow acutely to maximize functional mobility independence and safety.      Follow Up Recommendations Home health PT    Equipment Recommendations  None recommended by PT    Recommendations for Other Services       Precautions / Restrictions Precautions Precautions: Fall Restrictions Weight Bearing Restrictions: No      Mobility  Bed Mobility Overal bed mobility: Needs Assistance Bed Mobility: Supine to Sit;Sit to Supine     Supine to sit: Supervision Sit to supine: Supervision   General bed mobility comments: Supervision for safety.   Transfers Overall transfer level: Needs assistance Equipment used: 1 person hand held assist Transfers: Sit to/from Stand Sit to Stand: Min guard         General transfer comment: Min guard for safety.   Ambulation/Gait Ambulation/Gait assistance: Min guard Gait Distance (Feet): 15 Feet Assistive device: 1 person hand held assist Gait Pattern/deviations: Step-through pattern;Decreased stride length Gait velocity: Decreased   General Gait Details: Slower gait with mild unsteadiness. Pt not feeling well so distance limited to within the room.   Stairs            Wheelchair Mobility    Modified Rankin (Stroke Patients Only)       Balance Overall balance assessment: Needs  assistance Sitting-balance support: No upper extremity supported;Feet supported Sitting balance-Leahy Scale: Fair     Standing balance support: No upper extremity supported;Single extremity supported;During functional activity Standing balance-Leahy Scale: Fair Standing balance comment: Able to maintain static standing without UE support                              Pertinent Vitals/Pain Pain Assessment: No/denies pain    Home Living Family/patient expects to be discharged to:: Private residence Living Arrangements: Spouse/significant other Available Help at Discharge: Family;Available 24 hours/day Type of Home: House Home Access: Stairs to enter Entrance Stairs-Rails: Right;Left;Can reach both Entrance Stairs-Number of Steps: 3 Home Layout: One level Home Equipment: Toilet riser;Cane - single point;Walker - 2 wheels      Prior Function Level of Independence: Independent with assistive device(s)         Comments: Used cane for ambulation      Hand Dominance        Extremity/Trunk Assessment   Upper Extremity Assessment Upper Extremity Assessment: Generalized weakness    Lower Extremity Assessment Lower Extremity Assessment: Generalized weakness    Cervical / Trunk Assessment Cervical / Trunk Assessment: Kyphotic  Communication   Communication: No difficulties  Cognition Arousal/Alertness: Awake/alert Behavior During Therapy: WFL for tasks assessed/performed Overall Cognitive Status: Within Functional Limits for tasks assessed                                 General Comments: A and O X4 this  session.       General Comments      Exercises     Assessment/Plan    PT Assessment Patient needs continued PT services  PT Problem List Decreased strength;Decreased balance;Decreased mobility;Decreased activity tolerance;Decreased knowledge of use of DME;Decreased knowledge of precautions       PT Treatment Interventions DME  instruction;Gait training;Stair training;Functional mobility training;Therapeutic activities;Therapeutic exercise;Balance training;Patient/family education    PT Goals (Current goals can be found in the Care Plan section)  Acute Rehab PT Goals Patient Stated Goal: to feel better PT Goal Formulation: With patient Time For Goal Achievement: 06/07/20 Potential to Achieve Goals: Good    Frequency Min 3X/week   Barriers to discharge        Co-evaluation               AM-PAC PT "6 Clicks" Mobility  Outcome Measure Help needed turning from your back to your side while in a flat bed without using bedrails?: None Help needed moving from lying on your back to sitting on the side of a flat bed without using bedrails?: None Help needed moving to and from a bed to a chair (including a wheelchair)?: A Little Help needed standing up from a chair using your arms (e.g., wheelchair or bedside chair)?: A Little Help needed to walk in hospital room?: A Little Help needed climbing 3-5 steps with a railing? : A Lot 6 Click Score: 19    End of Session Equipment Utilized During Treatment: Gait belt Activity Tolerance: Treatment limited secondary to medical complications (Comment) (nausea) Patient left: in bed;with call bell/phone within reach;with bed alarm set Nurse Communication: Mobility status PT Visit Diagnosis: Other abnormalities of gait and mobility (R26.89);Muscle weakness (generalized) (M62.81)    Time: 2355-7322 PT Time Calculation (min) (ACUTE ONLY): 19 min   Charges:   PT Evaluation $PT Eval Moderate Complexity: 1 Mod          Reuel Derby, PT, DPT  Acute Rehabilitation Services  Pager: [phone] Office: [phone]   Rudean Hitt 05/24/2020, 5:47 PM

## 2020-05-24 NOTE — ED Notes (Signed)
IV team at bedside 

## 2020-05-24 NOTE — ED Triage Notes (Signed)
Pt BIB [facility] for eval of AMS. Per wife, pt was "talking out of his head." Pt hypotensive en route, 16'R systolic, VSS otherwise. Hx stomach cancer, seen at [facility] for same, CHF, R bundle branch block. Pt CA&O4 on arrival, saying he "does not want to die." Per EMS, wife to arrive shortly

## 2020-05-24 NOTE — Progress Notes (Signed)
Same day note  Patient seen and examined at bedside.  Patient was admitted to the hospital for confusion  At the time of my evaluation, patient complains of vomiting fever chills.  Physical examination reveals thinly built elderly male alert awake communicative.  Laboratory data and imaging was reviewed  Assessment and Plan.  Acute metabolic encephalopathy  Patient presented with acute confusion.  Improved at this time.  Probably secondary to mild volume depletion acute kidney injury.  Negative ammonia negative. Covid CT head was negative.  Check vitamin B12 folate TSH.  Closely monitor rule out infection.  Acute kidney injury superimposed on CKD IIIa; hyperkalemia   - SCr is 1.97 in ED; was 1.34 earlier this month.   Monitor BMP closely.   Hold diuretics.  Chronic combined systolic and diastolic CHF  Compensated.  Lower extremity swelling but albumin 1.5.  Diuretics on hold.  Monitor intake and output charting, Daily weights.  Mantle cell lymphoma; gastric cancer  - Follows with oncology, not undergoing any treatment.  Had labs portion of the stomach removed  History of CAD No chest pain.  Continue statin     History of hypertension  Hold antihypertensives   Debility, weakness.  Will get PT evaluation.  Disposition.  We will put the patient on clear liquids.  Will monitor today.  Follow cultures.  Follow renal function.  Hold diuretics.  Likely disposition home tomorrow if clinical progresses.  Will get PT evaluation.  Patient states that he lives with his wife at home.  No Charge  Signed,  Delila Pereyra, MD [facility]

## 2020-05-25 DIAGNOSIS — Z515 Encounter for palliative care: Secondary | ICD-10-CM | POA: Diagnosis not present

## 2020-05-25 DIAGNOSIS — C8299 Follicular lymphoma, unspecified, extranodal and solid organ sites: Secondary | ICD-10-CM | POA: Diagnosis not present

## 2020-05-25 DIAGNOSIS — I130 Hypertensive heart and chronic kidney disease with heart failure and stage 1 through stage 4 chronic kidney disease, or unspecified chronic kidney disease: Secondary | ICD-10-CM | POA: Diagnosis present

## 2020-05-25 DIAGNOSIS — R55 Syncope and collapse: Secondary | ICD-10-CM | POA: Diagnosis not present

## 2020-05-25 DIAGNOSIS — E875 Hyperkalemia: Secondary | ICD-10-CM | POA: Diagnosis present

## 2020-05-25 DIAGNOSIS — Z7189 Other specified counseling: Secondary | ICD-10-CM | POA: Diagnosis not present

## 2020-05-25 DIAGNOSIS — M797 Fibromyalgia: Secondary | ICD-10-CM | POA: Diagnosis present

## 2020-05-25 DIAGNOSIS — C8310 Mantle cell lymphoma, unspecified site: Secondary | ICD-10-CM | POA: Diagnosis present

## 2020-05-25 DIAGNOSIS — Z8249 Family history of ischemic heart disease and other diseases of the circulatory system: Secondary | ICD-10-CM | POA: Diagnosis not present

## 2020-05-25 DIAGNOSIS — R5381 Other malaise: Secondary | ICD-10-CM | POA: Diagnosis not present

## 2020-05-25 DIAGNOSIS — E785 Hyperlipidemia, unspecified: Secondary | ICD-10-CM | POA: Diagnosis present

## 2020-05-25 DIAGNOSIS — G9340 Encephalopathy, unspecified: Secondary | ICD-10-CM | POA: Diagnosis not present

## 2020-05-25 DIAGNOSIS — C8313 Mantle cell lymphoma, intra-abdominal lymph nodes: Secondary | ICD-10-CM | POA: Diagnosis not present

## 2020-05-25 DIAGNOSIS — N179 Acute kidney failure, unspecified: Secondary | ICD-10-CM | POA: Diagnosis present

## 2020-05-25 DIAGNOSIS — E43 Unspecified severe protein-calorie malnutrition: Secondary | ICD-10-CM | POA: Diagnosis present

## 2020-05-25 DIAGNOSIS — Z85828 Personal history of other malignant neoplasm of skin: Secondary | ICD-10-CM | POA: Diagnosis not present

## 2020-05-25 DIAGNOSIS — I5042 Chronic combined systolic (congestive) and diastolic (congestive) heart failure: Secondary | ICD-10-CM | POA: Diagnosis present

## 2020-05-25 DIAGNOSIS — F418 Other specified anxiety disorders: Secondary | ICD-10-CM | POA: Diagnosis not present

## 2020-05-25 DIAGNOSIS — Z6824 Body mass index (BMI) 24.0-24.9, adult: Secondary | ICD-10-CM | POA: Diagnosis not present

## 2020-05-25 DIAGNOSIS — C8318 Mantle cell lymphoma, lymph nodes of multiple sites: Secondary | ICD-10-CM | POA: Diagnosis not present

## 2020-05-25 DIAGNOSIS — M2550 Pain in unspecified joint: Secondary | ICD-10-CM | POA: Diagnosis not present

## 2020-05-25 DIAGNOSIS — Z66 Do not resuscitate: Secondary | ICD-10-CM | POA: Diagnosis present

## 2020-05-25 DIAGNOSIS — C162 Malignant neoplasm of body of stomach: Secondary | ICD-10-CM | POA: Diagnosis present

## 2020-05-25 DIAGNOSIS — K219 Gastro-esophageal reflux disease without esophagitis: Secondary | ICD-10-CM | POA: Diagnosis present

## 2020-05-25 DIAGNOSIS — Z85028 Personal history of other malignant neoplasm of stomach: Secondary | ICD-10-CM | POA: Diagnosis not present

## 2020-05-25 DIAGNOSIS — R4182 Altered mental status, unspecified: Secondary | ICD-10-CM | POA: Diagnosis not present

## 2020-05-25 DIAGNOSIS — N1831 Chronic kidney disease, stage 3a: Secondary | ICD-10-CM | POA: Diagnosis present

## 2020-05-25 DIAGNOSIS — N1832 Chronic kidney disease, stage 3b: Secondary | ICD-10-CM | POA: Diagnosis not present

## 2020-05-25 DIAGNOSIS — F419 Anxiety disorder, unspecified: Secondary | ICD-10-CM | POA: Diagnosis present

## 2020-05-25 DIAGNOSIS — I2510 Atherosclerotic heart disease of native coronary artery without angina pectoris: Secondary | ICD-10-CM | POA: Diagnosis present

## 2020-05-25 DIAGNOSIS — H9190 Unspecified hearing loss, unspecified ear: Secondary | ICD-10-CM | POA: Diagnosis present

## 2020-05-25 DIAGNOSIS — I5040 Unspecified combined systolic (congestive) and diastolic (congestive) heart failure: Secondary | ICD-10-CM | POA: Diagnosis not present

## 2020-05-25 DIAGNOSIS — E46 Unspecified protein-calorie malnutrition: Secondary | ICD-10-CM | POA: Diagnosis present

## 2020-05-25 DIAGNOSIS — Z888 Allergy status to other drugs, medicaments and biological substances status: Secondary | ICD-10-CM | POA: Diagnosis not present

## 2020-05-25 DIAGNOSIS — Z20822 Contact with and (suspected) exposure to covid-19: Secondary | ICD-10-CM | POA: Diagnosis present

## 2020-05-25 DIAGNOSIS — C169 Malignant neoplasm of stomach, unspecified: Secondary | ICD-10-CM | POA: Diagnosis not present

## 2020-05-25 DIAGNOSIS — I452 Bifascicular block: Secondary | ICD-10-CM | POA: Diagnosis present

## 2020-05-25 DIAGNOSIS — Z7401 Bed confinement status: Secondary | ICD-10-CM | POA: Diagnosis not present

## 2020-05-25 DIAGNOSIS — G9341 Metabolic encephalopathy: Secondary | ICD-10-CM | POA: Diagnosis present

## 2020-05-25 DIAGNOSIS — I951 Orthostatic hypotension: Secondary | ICD-10-CM | POA: Diagnosis not present

## 2020-05-25 LAB — CBC
HCT: 43.4 % (ref 39.0–52.0)
Hemoglobin: 14.7 g/dL (ref 13.0–17.0)
MCH: 34.3 pg — ABNORMAL HIGH (ref 26.0–34.0)
MCHC: 33.9 g/dL (ref 30.0–36.0)
MCV: 101.4 fL — ABNORMAL HIGH (ref 80.0–100.0)
Platelets: 169 10*3/uL (ref 150–400)
RBC: 4.28 MIL/uL (ref 4.22–5.81)
RDW: 16.1 % — ABNORMAL HIGH (ref 11.5–15.5)
WBC: 8.8 10*3/uL (ref 4.0–10.5)
nRBC: 0.2 % (ref 0.0–0.2)

## 2020-05-25 LAB — COMPREHENSIVE METABOLIC PANEL
ALT: 15 U/L (ref 0–44)
AST: 24 U/L (ref 15–41)
Albumin: 1.3 g/dL — ABNORMAL LOW (ref 3.5–5.0)
Alkaline Phosphatase: 50 U/L (ref 38–126)
Anion gap: 8 (ref 5–15)
BUN: 25 mg/dL — ABNORMAL HIGH (ref 8–23)
CO2: 18 mmol/L — ABNORMAL LOW (ref 22–32)
Calcium: 7.3 mg/dL — ABNORMAL LOW (ref 8.9–10.3)
Chloride: 111 mmol/L (ref 98–111)
Creatinine, Ser: 1.89 mg/dL — ABNORMAL HIGH (ref 0.61–1.24)
GFR calc Af Amer: 37 mL/min — ABNORMAL LOW (ref >60)
GFR calc non Af Amer: 32 mL/min — ABNORMAL LOW (ref >60)
Glucose, Bld: 83 mg/dL (ref 70–99)
Potassium: 5 mmol/L (ref 3.5–5.1)
Sodium: 137 mmol/L (ref 135–145)
Total Bilirubin: 1.5 mg/dL — ABNORMAL HIGH (ref 0.3–1.2)
Total Protein: <3 g/dL — ABNORMAL LOW (ref 6.5–8.1)

## 2020-05-25 LAB — RPR: RPR Ser Ql: NONREACTIVE

## 2020-05-25 LAB — UREA NITROGEN, URINE: Urea Nitrogen, Ur: 840 mg/dL

## 2020-05-25 LAB — URINE CULTURE: Culture: NO GROWTH

## 2020-05-25 LAB — PHOSPHORUS: Phosphorus: 4 mg/dL (ref 2.5–4.6)

## 2020-05-25 LAB — MAGNESIUM: Magnesium: 1.8 mg/dL (ref 1.7–2.4)

## 2020-05-25 MED ORDER — SPIRONOLACTONE 50 MG PO TABS: 50.0000 mg | ORAL_TABLET | Freq: Every day | ORAL | 3 refills | Status: AC

## 2020-05-25 MED ORDER — FUROSEMIDE 40 MG PO TABS
40.0000 mg | ORAL_TABLET | ORAL | 3 refills | Status: AC
Start: 2020-05-27 — End: ?

## 2020-05-25 MED ORDER — SODIUM CHLORIDE 0.9 % IV SOLN: INTRAVENOUS | Status: AC

## 2020-05-25 NOTE — Progress Notes (Addendum)
PROGRESS NOTE  [name]. JKK:[id number] DOB: 12/12/1932 DOA: 05/24/2020 PCP: Eulas Post, MD   LOS: 0 days   Brief narrative: As per HPI,  [name]. is a 86 y.o. male with medical history significant for mantle cell lymphoma, gastric cancer, chronic combined systolic and diastolic CHF, GERD, CAD, and chronic renal insufficiency, now presenting to the emergency department for evaluation of confusion.  Patient's wife reports that he went to bed in his usual state of health but woke with marked confusion.  He was not dysarthric but his speech was inappropriate.  He had not been complaining of anything in particular, and he has no complaints in the ED.  ED Course: Upon arrival to the ED, patient is found to be afebrile, saturating mid 90s on room air, and with blood pressure 102/59.  EKG features sinus rhythm with RBBB and LAFB.  Chest x-ray is negative for pulmonary disease and noncontrast head CT is negative for acute infarction, mass, or hemorrhage.  Chemistry panel is notable for albumin of 1.5, potassium 5.7, and creatinine 1.97, up from 1.34 earlier this month.  Lactic acid is reassuringly normal.  Ammonia level is normal.  COVID-19 PCR is negative.  Ethanol is undetectable.  CBC has not yet resulted.  Blood cultures were collected in the ED hospitalist asked to admit.   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 Negative ammonia negative.Covid was negative.  CT head was  negative.  Folate and vitamin B12 within normal limits. TSH slightly elevated.   Unlikely to be infection related.  Urine culture was negative.  No fever or leukocytosis.  Blood cultures negative in 1 day.  Acute kidney injury superimposed on CKD IIIa; hyperkalemia SCr is 1.97 in ED; was 1.34 earlier this month.  Diuretics on hold.  Will need to resume with closer lab follow-up.  Potassium today at 5.0. will provide gentle IV hydration today.  Chronic combined systolic and diastolic CHF Compensated so far. Watch for fluid overload.  Lower extremity swelling but albumin 1.5.  Diuretics on hold.  Monitor intake and output charting, Daily weights.  Mantle cell lymphoma; gastric cancer -Follows with oncology, not undergoing any treatment.  Had large portion of the stomach removed. Will get outpatient palliative care on discharge.  History of CAD No chest pain.  Continue statin    History of hypertension  Hold antihypertensives, patient did have positive orthostatic vitals today and had dizziness and near passing out episode. Will provide IV fluid with normal saline 500 ml in total. Orthostatic evaluation q shift. On entresto, lasix, spironolactone at home.   Debility, weakness.  Seen by physical therapy who recommended home PT on discharge.  Severe protein calorie malnutrition.  Present on admission.  Patient does have advanced malignancy and history of gastric resection contributing to malnutrition. Will add nutritional supplementation on discharge.    DVT prophylaxis: heparin injection 5,000 Units Start: 05/24/20 0600   Code Status:  Full code  Family Communication: I tried to call the patient's wife and daughter multiple times  without success.   Status is: Observation  The patient will require care spanning > 2 midnights and should be moved to inpatient because: Unsafe d/c plan, IV treatments appropriate due to intensity of illness or inability to take PO and orthostatic  and near syncope  Dispo:  Patient From: Home  Planned Disposition: Home with Health Care Svc  Expected discharge date: 05/26/20  Medically stable for discharge: No   Consultants:  No  Procedures:  No  Antibiotics:  . None  Anti-infectives (From admission, onward)   None     Subjective: Today, patient was seen and examined at bedside. Complained of generalized weakness. Nursing staff reported that the patient was orthostatic with near fall episode.   Objective: Vitals:   05/25/20 0716 05/25/20 1226  BP: (!) 100/55 (!) 104/56  Pulse: 79 74  Resp: 16 16  Temp: (!) 97.5 F (36.4 C) 97.6 F (36.4 C)  SpO2: 99% 100%    Intake/Output Summary (Last 24 hours) at 05/25/2020 1517 Last data filed at 05/25/2020 0351 Gross per 24 hour  Intake --  Output 202 ml  Net -202 ml   Filed Weights   05/24/20 0209  Weight: 82.5 kg   Body mass index is 24.67 kg/m.   Physical Exam: General: Alert awake, not in obvious distress, communicative, cachectic HENT: pupils equally reacting to light,  No scleral pallor or icterus noted. Oral mucosa is moist.  Chest:  Clear breath sounds.  Diminished breath sounds bilaterally.  CVS: S1 &S2 heard. No murmur.  Regular rate and rhythm. Abdomen: Soft, nontender, nondistended.  Bowel sounds are heard.   Extremities: No cyanosis, clubbing but bilateral lower extremity pitting edema noted. Peripheral pulses are palpable. Psych: Alert, awake and oriented, normal mood CNS:  No cranial nerve deficits.  Power equal in all extremities.   Skin: Warm and dry.  No rashes noted.  Data Review: I have personally reviewed the following laboratory data and studies,  CBC: Recent Labs  Lab 05/24/20 0557 05/25/20 0207  WBC 9.0 8.8  NEUTROABS 3.3  --   HGB 15.1 14.7  HCT 44.5 43.4  MCV 99.1 101.4*  PLT 191 161   Basic Metabolic Panel: Recent Labs  Lab 05/24/20 0213 05/25/20 0207  NA 136 137  K 5.7* 5.0  CL 106 111  CO2 23 18*  GLUCOSE 96 83   BUN 26* 25*  CREATININE 1.97* 1.89*  CALCIUM 7.3* 7.3*  MG  --  1.8  PHOS  --  4.0   Liver Function Tests: Recent Labs  Lab 05/24/20 0213 05/25/20 0207  AST 34 24  ALT 18 15  ALKPHOS 58 50  BILITOT 1.5* 1.5*  PROT <3.0* <3.0*  ALBUMIN 1.5* 1.3*   No results for input(s): LIPASE, AMYLASE in the last 168 hours. Recent Labs  Lab 05/24/20 0447  AMMONIA 30   Cardiac Enzymes: No results for input(s): CKTOTAL, CKMB, CKMBINDEX, TROPONINI in the last 168 hours. BNP (last 3 results) No results for input(s): BNP in the last 8760 hours.  ProBNP (last 3 results) Recent Labs    04/19/20 1047 05/05/20 1102  PROBNP 293 320    CBG: No results for input(s): GLUCAP in the last 168 hours. Recent Results (from the past 240 hour(s))  SARS Coronavirus 2 by RT PCR (hospital order, performed in [facility] hospital lab) Nasopharyngeal Nasopharyngeal Swab     Status: None   Collection Time: 05/24/20  3:28 AM   Specimen: Nasopharyngeal Swab  Result Value Ref Range Status  SARS Coronavirus 2 NEGATIVE NEGATIVE Final    Comment: (NOTE) SARS-CoV-2 target nucleic acids are NOT DETECTED.  The SARS-CoV-2 RNA is generally detectable in upper and lower respiratory specimens during the acute phase of infection. The lowest concentration of SARS-CoV-2 viral copies this assay can detect is 250 copies / mL. A negative result does not preclude SARS-CoV-2 infection and should not be used as the sole basis for treatment or other patient management decisions.  A negative result may occur with improper specimen collection / handling, submission of specimen other than nasopharyngeal swab, presence of viral mutation(s) within the areas targeted by this assay, and inadequate number of viral copies (<250 copies / mL). A negative result must be combined with clinical observations, patient history, and epidemiological information.  Fact Sheet for Patients:    StrictlyIdeas.no  Fact Sheet for Healthcare Providers: BankingDealers.co.za  This test is not yet approved or  cleared by the [place] FDA and has been authorized for detection and/or diagnosis of SARS-CoV-2 by FDA under an Emergency Use Authorization (EUA).  This EUA will remain in effect (meaning this test can be used) for the duration of the COVID-19 declaration under Section 564(b)(1) of the Act, 21 U.S.C. section 360bbb-3(b)(1), unless the authorization is terminated or revoked sooner.  Performed at [facility], [place] [address] [place], [place] [address]   Culture, blood (routine x 2)     Status: None (Preliminary result)   Collection Time: 05/24/20  5:00 AM   Specimen: BLOOD RIGHT WRIST  Result Value Ref Range Status   Specimen Description BLOOD RIGHT WRIST  Final   Special Requests   Final    BOTTLES DRAWN AEROBIC ONLY Blood Culture results may not be optimal due to an inadequate volume of blood received in culture bottles   Culture   Final    NO GROWTH 1 DAY Performed at [facility], [place] [address], [place], [place] [address]    Report Status PENDING  Incomplete  Urine culture     Status: None   Collection Time: 05/24/20  5:36 AM   Specimen: Urine, Random  Result Value Ref Range Status   Specimen Description URINE, RANDOM  Final   Special Requests NONE  Final   Culture   Final    NO GROWTH Performed at [facility], [place] [address] [place], [place] [address]    Report Status 05/25/2020 FINAL  Final     Studies: CT Head Wo Contrast  Result Date: 05/24/2020 CLINICAL DATA:  Altered mental status.  Mantle cell lymphoma EXAM: CT HEAD WITHOUT CONTRAST TECHNIQUE: Contiguous axial images were obtained from the base of the skull through the vertex without intravenous contrast. COMPARISON:  January 02, 2018 FINDINGS: Brain: Diffuse atrophy is stable compared to previous study. There is no intracranial  mass, hemorrhage, extra-axial fluid collection, or midline shift. Patchy small vessel disease in the centra semiovale bilaterally is stable. There is no evident acute infarct. Vascular: No hyperdense vessel. Trace vascular calcification in the carotid siphon regions. Skull: Bony calvarium appears intact. Sinuses/Orbits: There is opacification in several ethmoid air cells as well as apparent retention cyst in the inferior left frontal sinus. Orbits appear symmetric bilaterally except for apparent cataract removal on the left. Other: Mastoid air cells are clear. IMPRESSION: Stable diffuse atrophy with patchy periventricular small vessel disease. No acute infarct appreciable. No mass or hemorrhage. Foci of paranasal sinus disease noted. Electronically Signed   By: Lowella Grip III M.D.  On: 05/24/2020 05:02   US RENAL  Result Date: 05/24/2020 CLINICAL DATA:  Acute renal failure superimposed on stage III chronic renal disease. Mantle cell lymphoma EXAM: RENAL / URINARY TRACT ULTRASOUND COMPLETE COMPARISON:  PET-CT examination is January 11, 2020 and May 17, 2020 FINDINGS: Right Kidney: Renal measurements: 8.2 x 3.9 x 4.3 cm = volume: 72.0 mL . Echogenicity within normal limits. There is renal cortical thinning. No mass, perinephric fluid, or hydronephrosis visualized. No sonographically demonstrable calculus or ureterectasis. Left Kidney: Renal measurements: 10.6 x 4.4 x 3.3 cm = volume: 81.3 mL. Echogenicity and renal cortical thickness are within normal limits. No mass, perinephric fluid, or hydronephrosis visualized. No sonographically demonstrable calculus or ureterectasis. Bladder: Appears normal for degree of bladder distention. Other: Prostate measures 5.7 x 4.3 x 3.6 cm with a measured volume of 48.8 mL. IMPRESSION: Right kidney is somewhat atrophic in appearance. No obstructing focus in either kidney. Kidneys otherwise unremarkable in appearance. Prostate mildly prominent. Electronically Signed   By:  Lowella Grip III M.D.   On: 05/24/2020 07:04   DG Chest Port 1 View  Result Date: 05/24/2020 CLINICAL DATA:  Altered mental status EXAM: PORTABLE CHEST 1 VIEW COMPARISON:  05/17/2020 FINDINGS: The heart size and mediastinal contours are within normal limits. Both lungs are clear. The visualized skeletal structures are unremarkable. IMPRESSION: No active disease. Electronically Signed   By: Inez Catalina M.D.   On: 05/24/2020 02:29      Flora Lipps, MD  [facility] 05/25/2020

## 2020-05-25 NOTE — Telephone Encounter (Signed)
Follow Up  Patient's daughter is calling in to inform Dr. Harrington Challenger and her nurse the patient is in the hospital. [name] would like a call back to discuss. Please give patient's daughter a call back.

## 2020-05-25 NOTE — Care Management Obs Status (Signed)
[place] NOTIFICATION   Patient Details  Name: [name]. MRN: [id number] Date of Birth: 1933/01/07   Medicare Observation Status Notification Given:  Yes    Joanne Chars, LCSW 05/25/2020, 11:37 AM

## 2020-05-25 NOTE — TOC Initial Note (Signed)
Transition of Care (TOC) - Initial/Assessment Note    Patient Details  Name: [name]. MRN: [id number] Date of Birth: [birth year]  Transition of Care [facility]) CM/SW Contact:    Joanne Chars, LCSW Phone Number: 05/25/2020, 11:53 AM  Clinical Narrative:     CSW met with pt to discuss follow up.  Pt has worked with [facility] agency previously but does not remember name.  Pt agreeable to [facility] services.  No equipment recommendations currently.               Expected Discharge Plan: [place] Barriers to Discharge: Continued Medical Work up   Patient Goals and CMS Choice Patient states their goals for this hospitalization and ongoing recovery are:: return back home CMS Medicare.gov Compare Post Acute Care list provided to:: Patient Choice offered to / list presented to : Patient  Expected Discharge Plan and Services Expected Discharge Plan: [place]   Discharge Planning Services: CM Consult Post Acute Care Choice: [place] arrangements for the past 2 months: Single Family Home Expected Discharge Date: 05/25/20                                    Prior Living Arrangements/Services Living arrangements for the past 2 months: Single Family Home Lives with:: Spouse Patient language and need for interpreter reviewed:: No Do you feel safe going back to the place where you live?: Yes      Need for Family Participation in Patient Care: No (Comment) Care giver support system in place?: Yes (comment)   Criminal Activity/Legal Involvement Pertinent to Current Situation/Hospitalization: No - Comment as needed  Activities of Daily Living      Permission Sought/Granted Permission sought to share information with : Chartered certified accountant granted to share information with : Yes, Verbal Permission Granted  Share Information with NAME: wife, Channing Mutters  Permission granted to share info w AGENCY: HH         Emotional Assessment Appearance:: Appears stated age Attitude/Demeanor/Rapport: Engaged Affect (typically observed): Pleasant Orientation: : Oriented to Self, Oriented to Place, Oriented to  Time, Oriented to Situation Alcohol / Substance Use: Not Applicable Psych Involvement: No (comment)  Admission diagnosis:  Acute encephalopathy [G93.40] Altered mental status, unspecified altered mental status type [R41.82] Acute renal failure superimposed on stage 3a chronic kidney disease ([place]) [N17.9, N18.31] Patient Active Problem List   Diagnosis Date Noted  . Acute encephalopathy 05/24/2020  . Chronic combined systolic and diastolic CHF (congestive heart failure) ([place]) 05/24/2020  . Hyperkalemia 05/24/2020  . Jejunostomy tube in situ ([place]) 03/16/2018  . Postoperative anemia 02/26/2018  . Weight loss 02/11/2018  . Situational depression 02/03/2018  . Generalized weakness 02/03/2018  . Nausea and vomiting   . Gastric cancer ([place]) 12/30/2017  . Malignant neoplasm of body of stomach ([place]) 12/04/2017  . Counseling regarding advanced care planning and goals of care 08/07/2017  . Drug-induced neutropenia ([place]) 06/26/2017  . Protein-calorie malnutrition, severe ([place])   . SOB (shortness of breath)   . Acute renal failure superimposed on stage 3a chronic kidney disease ([place]) 03/17/2017  . 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 GI bleed 02/03/2017  . Lower GI bleed 02/03/2017  . Coronary artery disease   . IFG (impaired fasting glucose) 10/03/2015  .  Orthostatic hypotension 08/24/2015  . GERD (gastroesophageal reflux disease) 03/24/2014  . ACP (advance care planning) 11/02/2013  . Left upper arm pain 09/15/2012  . Routine health maintenance 10/29/2011  . HEARING LOSS 11/05/2010  . Chest pain 11/06/2009  . SKIN CANCER, RECURRENT 10/13/2007  . Hyperlipidemia 10/13/2007  .  Gout 10/13/2007  . Essential hypertension 10/13/2007  . PVC (premature ventricular contraction) 10/13/2007  . PSORIASIS 10/13/2007  . OSTEOARTHRITIS, ANKLE, RIGHT 10/13/2007  . Other acquired absence of organ 10/13/2007   PCP:  Eulas Post, MD Pharmacy:   [id number] [place], [place] - [address] [place] [address]Phone: [phone]: [phone] [place] [address] [place]Phone: [phone]: [phone] [facility] [address] [place]Phone: [phone]: [phone]    Social Determinants of Health (SDOH) Interventions    Readmission Risk Interventions No flowsheet data found.

## 2020-05-25 NOTE — Progress Notes (Signed)
Physical Therapy Treatment Patient Details Name: [name]. MRN: [id number] DOB: 17-Feb-1933 Today's Date: 05/25/2020    History of Present Illness Pt is an 86 y/o male admitted secondary to acute encephalopathy, likely from AKI. CT negative for acute abnormality. PMH includes CAD, CKD, CHF, gastric cancer, and HTN.     PT Comments    Pt was unable to progress OOB secondary to dizziness and orthostatic hypotension. Pt is not appropriate to d/c home at this time. See vitals below. Pt attempted to sit EOB but was unable to tolerate long before requesting to lay back down. Orthostatic vitals were then taken. Unable to continue session. Pt demonstrates good strength as he was able to perform bridging and scoot up in bed with out assist. Feel pt will progress well once blood pressure is under control. Will continue to follow.     05/25/20 1100  Orthostatic Lying   BP- Lying 114/66  Pulse- Lying 107  Orthostatic Sitting  BP- Sitting (!) 87/67  Pulse- Sitting 107  Orthostatic Standing at 0 minutes  BP- Standing at 0 minutes (!) 65/51  Pulse- Standing at 0 minutes 79     Follow Up Recommendations  Home health PT     Equipment Recommendations  None recommended by PT    Recommendations for Other Services       Precautions / Restrictions Precautions Precautions: Fall Restrictions Weight Bearing Restrictions: No    Mobility  Bed Mobility Overal bed mobility: Needs Assistance Bed Mobility: Supine to Sit;Sit to Supine     Supine to sit: Supervision Sit to supine: Supervision   General bed mobility comments: Supervision for safety, laying very close to EOB  Transfers Overall transfer level: Needs assistance Equipment used: Rolling walker (2 wheeled) Transfers: Sit to/from Stand Sit to Stand: Min assist         General transfer comment: min A to rise  Ambulation/Gait             General Gait Details: Unable secondary to dizziness and orthostatic  hypotension   Stairs             Wheelchair Mobility    Modified Rankin (Stroke Patients Only)       Balance Overall balance assessment: Needs assistance Sitting-balance support: No upper extremity supported;Feet supported Sitting balance-Leahy Scale: Fair     Standing balance support: No upper extremity supported;Single extremity supported;During functional activity Standing balance-Leahy Scale: Fair Standing balance comment: Able to maintain static standing without UE support                             Cognition Arousal/Alertness: Awake/alert Behavior During Therapy: WFL for tasks assessed/performed Overall Cognitive Status: Within Functional Limits for tasks assessed                                 General Comments: Alert and orientated, Some safety concerns as pt would lay very close to EOB with rails down      Exercises      General Comments        Pertinent Vitals/Pain Pain Assessment: No/denies pain    Home Living                      Prior Function            PT Goals (current goals can now be found in  the care plan section) Acute Rehab PT Goals Patient Stated Goal: to feel better PT Goal Formulation: With patient Time For Goal Achievement: 06/07/20 Potential to Achieve Goals: Good Progress towards PT goals: Progressing toward goals    Frequency    Min 3X/week      PT Plan Current plan remains appropriate    Co-evaluation              AM-PAC PT "6 Clicks" Mobility   Outcome Measure  Help needed turning from your back to your side while in a flat bed without using bedrails?: None Help needed moving from lying on your back to sitting on the side of a flat bed without using bedrails?: None Help needed moving to and from a bed to a chair (including a wheelchair)?: A Little Help needed standing up from a chair using your arms (e.g., wheelchair or bedside chair)?: A Little Help needed to walk  in hospital room?: A Little Help needed climbing 3-5 steps with a railing? : A Lot 6 Click Score: 19    End of Session Equipment Utilized During Treatment: Gait belt Activity Tolerance: Treatment limited secondary to medical complications (Comment) (orthostatic hypotension) Patient left: in bed;with call bell/phone within reach;with bed alarm set;with family/visitor present Nurse Communication: Mobility status;Other (comment) (Orthostatic vitals) PT Visit Diagnosis: Other abnormalities of gait and mobility (R26.89);Muscle weakness (generalized) (M62.81)     Time: 4158-3094 PT Time Calculation (min) (ACUTE ONLY): 35 min  Charges:  $Therapeutic Activity: 23-37 mins                     Benjiman Core, [place] Pager [phone] Acute Rehab   Allena Katz 05/25/2020, 12:00 PM

## 2020-05-25 NOTE — Telephone Encounter (Signed)
Spoke with daughter Reviewed recent visits, current hsopitalization, labs etc OVerall pt fragile, no "fix' to problem   Too many systems    [place] consideration of palliative care    Will send msg to hospitalist

## 2020-05-26 LAB — CBC
HCT: 43.4 % (ref 39.0–52.0)
Hemoglobin: 14.9 g/dL (ref 13.0–17.0)
MCH: 34.2 pg — ABNORMAL HIGH (ref 26.0–34.0)
MCHC: 34.3 g/dL (ref 30.0–36.0)
MCV: 99.5 fL (ref 80.0–100.0)
Platelets: 162 10*3/uL (ref 150–400)
RBC: 4.36 MIL/uL (ref 4.22–5.81)
RDW: 15.9 % — ABNORMAL HIGH (ref 11.5–15.5)
WBC: 8.2 10*3/uL (ref 4.0–10.5)
nRBC: 0 % (ref 0.0–0.2)

## 2020-05-26 LAB — BASIC METABOLIC PANEL
Anion gap: 8 (ref 5–15)
BUN: 23 mg/dL (ref 8–23)
CO2: 20 mmol/L — ABNORMAL LOW (ref 22–32)
Calcium: 7.2 mg/dL — ABNORMAL LOW (ref 8.9–10.3)
Chloride: 111 mmol/L (ref 98–111)
Creatinine, Ser: 1.77 mg/dL — ABNORMAL HIGH (ref 0.61–1.24)
GFR calc Af Amer: 40 mL/min — ABNORMAL LOW (ref >60)
GFR calc non Af Amer: 34 mL/min — ABNORMAL LOW (ref >60)
Glucose, Bld: 87 mg/dL (ref 70–99)
Potassium: 4.3 mmol/L (ref 3.5–5.1)
Sodium: 139 mmol/L (ref 135–145)

## 2020-05-26 LAB — MAGNESIUM: Magnesium: 1.7 mg/dL (ref 1.7–2.4)

## 2020-05-26 NOTE — Progress Notes (Signed)
Physical Therapy Treatment Patient Details Name: [name]. MRN: [id number] DOB: August 23, 1933 Today's Date: 05/26/2020    History of Present Illness Pt is an 86 y/o male admitted secondary to acute encephalopathy, likely from AKI. CT negative for acute abnormality. PMH includes CAD, CKD, CHF, gastric cancer, and HTN.     PT Comments    Patient not able to progress with mobility today due to dizziness when sitting EOB and correlating orthostatic hypotension. Pt emotional and tearful during today's session. "I just want to die." "They are giving up on me and sending me to a nursing home." Tolerated bed mobility with supervision and heavy use of rails. Significant dizziness once EOB needing Min A for support. Did not resolve until pt returned to supine. See vitals below. Supine BP 96/66 HR 106 bpm Sitting BP 71/57 HR 114 bpm Supine BP 102/73 HR 78 bpm  Pt not letting this PT wrap LEs today, "I just don't want that," to see if it would help with BP. Encouraged sitting EOB for all meals and as much as tolerated to try to improve upright tolerance. Will continue to recommend HHPT and 24/7 supervision for all OOB mobility pending wife's ability to care for pt at this stage. Will follow.   Follow Up Recommendations  Supervision for mobility/OOB;Home health PT     Equipment Recommendations  None recommended by PT    Recommendations for Other Services       Precautions / Restrictions Precautions Precautions: Fall Precaution Comments: orthostatic Restrictions Weight Bearing Restrictions: No    Mobility  Bed Mobility Overal bed mobility: Needs Assistance Bed Mobility: Supine to Sit     Supine to sit: Supervision;HOB elevated Sit to supine: Supervision   General bed mobility comments: Supervision for safety, heavy use of rail for support. + dizziness once EOB needing Min A at times for support. Declined standing due to feeling bad and significant drop in BP. Able to help scoot  self up in bed using UEs/LEs.  Transfers                 General transfer comment: Deferred due to dizziness, feeling bad and drop in BP  Ambulation/Gait                 Stairs             Wheelchair Mobility    Modified Rankin (Stroke Patients Only)       Balance Overall balance assessment: Needs assistance Sitting-balance support: Feet supported;No upper extremity supported Sitting balance-Leahy Scale: Fair Sitting balance - Comments: Min A initially due to feeling dizzy, cues for gaze stabilization progressing to Min guard for support.                                    Cognition Arousal/Alertness: Awake/alert Behavior During Therapy:  (emotional, tearful) Overall Cognitive Status: No family/caregiver present to determine baseline cognitive functioning                                 General Comments: Pt emotional during session and tearful, "they are just giving up on me and sending me to a nursing home."" I am ready to die. " Provided a listening ear and comfort.      Exercises      General Comments General comments (skin integrity, edema, etc.): Supine BP  96/66 HR 106 bpm, sitting BP 71/57 HR 114 bpm, supine BP 102/73 HR 78 bpm      Pertinent Vitals/Pain Pain Assessment: No/denies pain    Home Living                      Prior Function            PT Goals (current goals can now be found in the care plan section) Progress towards PT goals: Not progressing toward goals - comment (orthostatic hypotension)    Frequency    Min 3X/week      PT Plan Current plan remains appropriate    Co-evaluation              AM-PAC PT "6 Clicks" Mobility   Outcome Measure  Help needed turning from your back to your side while in a flat bed without using bedrails?: None Help needed moving from lying on your back to sitting on the side of a flat bed without using bedrails?: None Help needed moving to  and from a bed to a chair (including a wheelchair)?: A Little Help needed standing up from a chair using your arms (e.g., wheelchair or bedside chair)?: A Little Help needed to walk in hospital room?: A Little Help needed climbing 3-5 steps with a railing? : A Lot 6 Click Score: 19    End of Session   Activity Tolerance: Treatment limited secondary to medical complications (Comment) (orthostasis) Patient left: in bed;with call bell/phone within reach;with bed alarm set Nurse Communication: Mobility status PT Visit Diagnosis: Other abnormalities of gait and mobility (R26.89);Muscle weakness (generalized) (M62.81)     Time: 7943-2761 PT Time Calculation (min) (ACUTE ONLY): 19 min  Charges:  $Therapeutic Activity: 8-22 mins                     Marisa Severin, PT, DPT Acute Rehabilitation Services Pager [phone] Office [phone]       Marguarite Arbour A Sabra Heck 05/26/2020, 12:44 PM

## 2020-05-26 NOTE — Progress Notes (Signed)
PROGRESS NOTE  [name]. KDX:[id number] DOB: Dec 27, 1932 DOA: 05/24/2020 PCP: Eulas Post, MD   LOS: 1 day   Brief narrative: As per HPI,  [name]. is a 86 y.o. male with medical history significant for mantle cell lymphoma, gastric cancer, chronic combined systolic and diastolic CHF, GERD, CAD, and chronic renal insufficiency, now presenting to the emergency department for evaluation of confusion.  Patient's wife reports that he went to bed in his usual state of health but woke with marked confusion.  He was not dysarthric but his speech was inappropriate.  He had not been complaining of anything in particular, and he has no complaints in the ED.  ED Course: Upon arrival to the ED, patient is found to be afebrile, saturating mid 90s on room air, and with blood pressure 102/59.  EKG features sinus rhythm with RBBB and LAFB.  Chest x-ray is negative for pulmonary disease and noncontrast head CT is negative for acute infarction, mass, or hemorrhage.  Chemistry panel is notable for albumin of 1.5, potassium 5.7, and creatinine 1.97, up from 1.34 earlier this month.  Lactic acid is reassuringly normal.  Ammonia level is normal.  COVID-19 PCR is negative.  Ethanol is undetectable.  CBC has not yet resulted.  Blood cultures were collected in the ED hospitalist asked to admit.   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 Negative ammonia negative.Covid was negative.  CT head was  negative.  Folate and vitamin B12 within normal limits. TSH slightly elevated.   Unlikely to be infection related.  Urine culture was negative.  No fever or leukocytosis.  Blood cultures negative in 1 day.  Acute kidney injury superimposed on CKD IIIa; hyperkalemia SCr is 1.97 in ED; was 1.34 earlier this month.  Diuretics on hold. Potassium today at 4.3.  On gentle IV fluids due to severe orthostatic hypotension.  Chronic combined systolic and diastolic CHF Compensated so far. Watch for fluid overload.  Lower extremity swelling but albumin 1.5.  Unlikely albumin will help since patient has poor oral intake for malignancy. Diuretics on hold.  Monitor intake and output charting, Daily weights.  Communicated with Dr. Harrington Challenger of cardiology who has been following the patient as outpatient.  She stated that patient had limited options for treatment and would highly benefit from a palliative care.  Mantle cell lymphoma; gastric cancer -Follows with oncology, not undergoing any treatment.  Had large portion of the stomach removed.  When speaking with the patient daughter she stated that the patient did have recurrence of cancer.  Not a good prognosis.  History of CAD No chest pain.  Continue statin    History of hypertension, now with orthostatic hypotension. Hold antihypertensives, patient did have positive orthostatic vitals yesterday and today with dizziness weakness and near passing episode.  Patient received a 500 mL of normal saline bolus followed by fluids overnight but still symptomatic.  Very likely that patient could be fluid overloaded so we will hold fluids after midday today.  Still off diuretics. Orthostatic evaluation q  shift. On entresto, lasix, spironolactone at home.   Debility, weakness.  Seen by physical therapy who recommended home PT on discharge.  Severe protein calorie malnutrition.  Present on admission.  Patient does have advanced malignancy and history of gastric  resection contributing to malnutrition. Will add nutritional supplementation on discharge.    DVT prophylaxis: heparin injection 5,000 Units Start: 05/24/20 0600  Code Status:  Full code  Family Communication: I was able to talk to the patient's daughter on the phone.  Updated about the clinical condition of the patient.  Discussed about potential poor prognosis in view of advanced age, heart failure, extreme debility deconditioning, severe protein calorie malnutrition, history of cancer with recurrence.  We talked about palliative care hospice evaluation.  She has agreed to it.  At this time will consult palliative care.  Status is: Inpatient  The patient is inpatient because: Unsafe d/c plan, IV treatments appropriate due to intensity of illness or inability to take PO and orthostatic and near syncope, palliative care discussion/possible hospice at home  Dispo:  Patient From: Home  Planned Disposition: Home with Health Care S[facility]/home hospice.  Undetermined at this time.  Consult palliative care.  Expected discharge date: 05/28/20  Medically stable for discharge: No   Consultants:  Palliative care  Procedures:  No  Antibiotics:  . None  Anti-infectives (From admission, onward)   None     Subjective: Today, patient was seen and examined at bedside.  Does not feel well and complains of generalized weakness fatigue.  Poor oral intake.  Was dizzy lightheaded and orthostatic on evaluation.  Denies any dyspnea, chest pain, shortness of breath at this time. Objective: Vitals:   05/26/20 0725 05/26/20 1217  BP: (!) 113/63 107/71  Pulse: 78 83  Resp: 20 20  Temp: 97.7 F (36.5 C) (!) 97.3 F (36.3 C)  SpO2: 100% 100%    Intake/Output Summary (Last 24 hours) at 05/26/2020 1443 Last data filed at 05/26/2020 0805 Gross per 24 hour  Intake --  Output 370 ml  Net -370 ml   Filed Weights   05/24/20 0209  Weight: 82.5 kg   Body mass index is 24.67 kg/m.   Physical  Exam: General: Alert awake, not in obvious distress, communicative, cachectic HENT: pupils equally reacting to light,  No scleral pallor or icterus noted. Oral mucosa is moist.  Chest:  .  Diminished breath sounds bilaterally.  CVS: S1 &S2 heard. No murmur.  Regular rate and rhythm. Abdomen: Soft, nontender, nondistended.  Bowel sounds are heard.   Extremities: No cyanosis, clubbing but bilateral lower extremity pedal edema. Peripheral pulses are palpable. Psych: Alert, awake and communicative. CNS:  No cranial nerve deficits.  Power equal in all extremities.   Skin: Warm and dry.  No rashes noted.  Data Review: I have personally reviewed the following laboratory data and studies,  CBC: Recent Labs  Lab 05/24/20 0557 05/25/20 0207 05/26/20 0321  WBC 9.0 8.8 8.2  NEUTROABS 3.3  --   --   HGB 15.1 14.7 14.9  HCT 44.5 43.4 43.4  MCV 99.1 101.4* 99.5  PLT 191 169 032   Basic Metabolic Panel: Recent Labs  Lab 05/24/20 0213 05/25/20 0207 05/26/20 0321  NA 136 137 139  K 5.7* 5.0 4.3  CL 106 111 111  CO2 23 18* 20*  GLUCOSE 96 83 87  BUN 26* 25* 23  CREATININE 1.97* 1.89* 1.77*  CALCIUM 7.3* 7.3* 7.2*  MG  --  1.8 1.7  PHOS  --  4.0  --    Liver Function Tests: Recent Labs  Lab 05/24/20 0213 05/25/20 0207  AST 34 24  ALT 18 15  ALKPHOS 58 50  BILITOT 1.5* 1.5*  PROT <3.0* <3.0*  ALBUMIN 1.5* 1.3*   No results for input(s): LIPASE, AMYLASE in the last 168 hours. Recent Labs  Lab 05/24/20 0447  AMMONIA 30   Cardiac Enzymes: No results for input(s): CKTOTAL, CKMB, CKMBINDEX, TROPONINI in the last 168 hours. BNP (last 3 results) No results for input(s): BNP in the last 8760 hours.  ProBNP (last 3 results) Recent Labs    04/19/20 1047 05/05/20 1102  PROBNP 293 320    CBG: No results for input(s): GLUCAP in the last 168 hours. Recent Results (from the past 240 hour(s))  SARS Coronavirus 2 by RT PCR (hospital order, performed in [facility] hospital lab)  Nasopharyngeal Nasopharyngeal Swab     Status: None   Collection Time: 05/24/20  3:28 AM   Specimen: Nasopharyngeal Swab  Result Value Ref Range Status   SARS Coronavirus 2 NEGATIVE NEGATIVE Final    Comment: (NOTE) SARS-CoV-2 target nucleic acids are NOT DETECTED.  The SARS-CoV-2 RNA is generally detectable in upper and lower respiratory specimens during the acute phase of infection. The lowest concentration of SARS-CoV-2 viral copies this assay can detect is 250 copies / mL. A negative result does not preclude SARS-CoV-2 infection and should not be used as the sole basis for treatment or other patient management decisions.  A negative result may occur with improper specimen collection / handling, submission of specimen other than nasopharyngeal swab, presence of viral mutation(s) within the areas targeted by this assay, and inadequate number of viral copies (<250 copies / mL). A negative result must be combined with clinical observations, patient history, and epidemiological information.  Fact Sheet for Patients:   StrictlyIdeas.no  Fact Sheet for Healthcare Providers: BankingDealers.co.za  This test is not yet approved or  cleared by the [place] FDA and has been authorized for detection and/or diagnosis of SARS-CoV-2 by FDA under an Emergency Use Authorization (EUA).  This EUA will remain in effect (meaning this test can be used) for the duration of the COVID-19 declaration under Section 564(b)(1) of the Act, 21 U.S.C. section 360bbb-3(b)(1), unless the authorization is terminated or revoked sooner.  Performed at [facility], [place] [address] [place], [place] [address]   Culture, blood (routine x 2)     Status: None (Preliminary result)   Collection Time: 05/24/20  5:00 AM   Specimen: BLOOD RIGHT WRIST  Result Value Ref Range Status   Specimen Description BLOOD RIGHT WRIST  Final   Special Requests   Final     BOTTLES DRAWN AEROBIC ONLY Blood Culture results may not be optimal due to an inadequate volume of blood received in culture bottles   Culture   Final    NO GROWTH 1 DAY Performed at [facility], [place] [address] [place], [place] [address]    Report Status PENDING  Incomplete  Urine culture     Status: None   Collection Time: 05/24/20  5:36 AM   Specimen: Urine, Random  Result Value Ref Range Status   Specimen Description URINE, RANDOM  Final   Special Requests NONE  Final   Culture   Final    NO GROWTH Performed at [facility], [place] [address], [place], [place] [address]    Report Status 05/25/2020 FINAL  Final  Studies: No results found.    Flora Lipps, MD  [facility] 05/26/2020

## 2020-05-27 LAB — BASIC METABOLIC PANEL
Anion gap: 8 (ref 5–15)
BUN: 22 mg/dL (ref 8–23)
CO2: 19 mmol/L — ABNORMAL LOW (ref 22–32)
Calcium: 7.2 mg/dL — ABNORMAL LOW (ref 8.9–10.3)
Chloride: 111 mmol/L (ref 98–111)
Creatinine, Ser: 1.79 mg/dL — ABNORMAL HIGH (ref 0.61–1.24)
GFR calc Af Amer: 39 mL/min — ABNORMAL LOW (ref >60)
GFR calc non Af Amer: 34 mL/min — ABNORMAL LOW (ref >60)
Glucose, Bld: 85 mg/dL (ref 70–99)
Potassium: 4.9 mmol/L (ref 3.5–5.1)
Sodium: 138 mmol/L (ref 135–145)

## 2020-05-27 LAB — CBC
HCT: 45.9 % (ref 39.0–52.0)
Hemoglobin: 15.8 g/dL (ref 13.0–17.0)
MCH: 34.9 pg — ABNORMAL HIGH (ref 26.0–34.0)
MCHC: 34.4 g/dL (ref 30.0–36.0)
MCV: 101.3 fL — ABNORMAL HIGH (ref 80.0–100.0)
Platelets: 159 10*3/uL (ref 150–400)
RBC: 4.53 MIL/uL (ref 4.22–5.81)
RDW: 15.9 % — ABNORMAL HIGH (ref 11.5–15.5)
WBC: 8.7 10*3/uL (ref 4.0–10.5)
nRBC: 0 % (ref 0.0–0.2)

## 2020-05-27 LAB — MAGNESIUM: Magnesium: 1.8 mg/dL (ref 1.7–2.4)

## 2020-05-27 MED ORDER — MIDODRINE HCL 5 MG PO TABS
5.0000 mg | ORAL_TABLET | Freq: Three times a day (TID) | ORAL | Status: DC
  Administered 2020-05-28 – 2020-05-30 (×7): 5 mg via ORAL
  Filled 2020-05-27 (×8): qty 1

## 2020-05-27 NOTE — Progress Notes (Signed)
PROGRESS NOTE  [name]. YOV:[id number] DOB: 03-Feb-1933 DOA: 05/24/2020 PCP: Eulas Post, MD   LOS: 2 days   Brief narrative: As per HPI,  [name]. is a 86 y.o. male with medical history significant for mantle cell lymphoma, gastric cancer, chronic combined systolic and diastolic CHF, GERD, CAD, and chronic renal insufficiency, presented to the emergency department for evaluation of confusion.  Patient's wife reported that he went to bed in his usual state of health but woke with marked confusion.  He was not dysarthric but his speech was inappropriate.  He had not been complaining of anything in particular, and he has no complaints in the ED. Upon arrival to the ED, patient is found to be afebrile, saturating mid 90s on room air, and with blood pressure 102/59.  EKG features sinus rhythm with RBBB and LAFB.  Chest x-ray is negative for pulmonary disease and noncontrast head CT is negative for acute infarction, mass, or hemorrhage.  Chemistry panel is notable for albumin of 1.5, potassium 5.7, and creatinine 1.97, up from 1.34 earlier this month.  Lactic acid is reassuringly normal.  Ammonia level is normal.  COVID-19 PCR is negative.  Ethanol is undetectable.    Blood cultures were drawn and the patient was considered for admission to the hospital.   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  Patient presented with acute confusion. Negative ammonia negative.Covid was negative.  CT head was negative.  Folate and vitamin B12  within normal limits. TSH slightly elevated.   Unlikely to be infection related.    No fever or leukocytosis.  Blood cultures and urine cultures negative so far  Orthostatic hypotension, near syncope.  While in the hospital.  Patient received IV fluids but has heart failure and significant peripheral edema.  Difficult to manage his current situation.  Acute kidney injury superimposed on CKD IIIa; hyperkalemia SCr is 1.97 in ED; was 1.34 earlier this month.   Received IV fluids.  Creatinine of 1.7 today.  Hold off with IV fluids due to heart failure.  Chronic combined systolic and diastolic CHF Compensated so far. Lower extremity swelling but albumin 1.5.  Unlikely albumin will help since patient has poor oral intake / malignancy. Diuretics on hold.  Monitor intake and output charting, Daily weights.  Dr. Harrington Challenger from cardiology had stated patient had limited options for treatment and would highly benefit from a palliative care.  Mantle cell lymphoma; gastric cancer -Follows with oncology, not undergoing any treatment.  Had large portion of the stomach removed.  Patient's daughter stated that patient did have recurrence of cancer.  Not a good prognosis.  History of CAD No chest pain.  Continue statin    History of hypertension, now with orthostatic hypotension. Hold antihypertensives, diuretics orthostatic evaluation q shift. On entresto, lasix, spironolactone at home which has been on hold..   Debility, weakness.  Seen by physical therapy who recommended home PT on discharge.  Severe protein calorie malnutrition.  Present on admission.  Patient does have advanced malignancy and history of gastric resection contributing to malnutrition. Will add  nutritional supplementation on discharge.    DVT prophylaxis: heparin injection 5,000 Units Start: 05/24/20 0600  Code Status:  Full code  Family Communication: None today. Spoke with the patient's daughter on the phone yesterday  prolonged discussion about goals of care.  Palliative care has been consulted with possible hospice transition.  Status is: Inpatient  The patient is inpatient because: Unsafe d/c plan, IV treatments appropriate due to intensity of illness or inability to take PO and orthostatic and near syncope, palliative care discussion/possible hospice at home  Dispo:  Patient From: Home  Planned Disposition: To be determined/home hospice.  Consult palliative care.  Expected discharge date: 05/29/20  Medically stable for discharge: No   Consultants:  Palliative care  Procedures:  No  Antibiotics:  . None  Anti-infectives (From admission, onward)   None     Subjective: Today, patient does not feel well he feels weak fatigued and tired with dizziness.  Denies any chest pain shortness of breath or dyspnea.  Objective: Vitals:   05/26/20 2350 05/27/20 0326  BP: (!) 105/62 (!) 100/59  Pulse: 86 78  Resp: 17 17  Temp: 97.8 F (36.6 C) 97.6 F (36.4 C)  SpO2: 99% 100%    Intake/Output Summary (Last 24 hours) at 05/27/2020 0820 Last data filed at 05/26/2020 2233 Gross per 24 hour  Intake 3 ml  Output 100 ml  Net -97 ml   Filed Weights   05/24/20 0209 05/27/20 0500  Weight: 82.5 kg 81.4 kg   Body mass index is 24.34 kg/m.   Physical Exam:  General: Alert awake, not in obvious distress, communicative, cachectic, appears deconditioned and frail. HENT: pupils equally reacting to light,  No scleral pallor or icterus noted. Oral mucosa is moist.  Chest:  .  Diminished breath sounds bilaterally.  CVS: S1 &S2 heard. No murmur.  Regular rate and rhythm. Abdomen: Soft, nontender, nondistended.  Bowel sounds are heard.   Extremities: No cyanosis, clubbing but bilateral lower extremity pedal edema. Peripheral pulses are palpable. Psych: Alert, awake and communicative. CNS:  No cranial nerve deficits.  Power equal in all extremities.   Skin: Warm and dry.  No rashes noted.  Data  Review: I have personally reviewed the following laboratory data and studies,  CBC: Recent Labs  Lab 05/24/20 0557 05/25/20 0207 05/26/20 0321 05/27/20 0324  WBC 9.0 8.8 8.2 8.7  NEUTROABS 3.3  --   --   --   HGB 15.1 14.7 14.9 15.8  HCT 44.5 43.4 43.4 45.9  MCV 99.1 101.4* 99.5 101.3*  PLT 191 169 162 573   Basic Metabolic Panel: Recent Labs  Lab 05/24/20 0213 05/25/20 0207 05/26/20 0321 05/27/20 0324  NA 136 137 139 138  K 5.7* 5.0 4.3 4.9  CL 106 111 111 111  CO2 23 18* 20* 19*  GLUCOSE 96 83 87 85  BUN 26* 25* 23 22  CREATININE 1.97* 1.89* 1.77* 1.79*  CALCIUM 7.3* 7.3* 7.2* 7.2*  MG  --  1.8 1.7 1.8  PHOS  --  4.0  --   --    Liver Function Tests: Recent Labs  Lab 05/24/20 0213 05/25/20 0207  AST 34 24  ALT 18 15  ALKPHOS 58 50  BILITOT 1.5* 1.5*  PROT <3.0* <3.0*  ALBUMIN 1.5* 1.3*   No results for input(s): LIPASE, AMYLASE in the last 168 hours. Recent Labs  Lab 05/24/20 0447  AMMONIA 30   Cardiac Enzymes: No results for input(s): CKTOTAL, CKMB, CKMBINDEX, TROPONINI in the  last 168 hours. BNP (last 3 results) No results for input(s): BNP in the last 8760 hours.  ProBNP (last 3 results) Recent Labs    04/19/20 1047 05/05/20 1102  PROBNP 293 320    CBG: No results for input(s): GLUCAP in the last 168 hours. Recent Results (from the past 240 hour(s))  SARS Coronavirus 2 by RT PCR (hospital order, performed in [facility] hospital lab) Nasopharyngeal Nasopharyngeal Swab     Status: None   Collection Time: 05/24/20  3:28 AM   Specimen: Nasopharyngeal Swab  Result Value Ref Range Status   SARS Coronavirus 2 NEGATIVE NEGATIVE Final    Comment: (NOTE) SARS-CoV-2 target nucleic acids are NOT DETECTED.  The SARS-CoV-2 RNA is generally detectable in upper and lower respiratory specimens during the acute phase of infection. The lowest concentration of SARS-CoV-2 viral copies this assay can detect is 250 copies / mL. A negative result does not  preclude SARS-CoV-2 infection and should not be used as the sole basis for treatment or other patient management decisions.  A negative result may occur with improper specimen collection / handling, submission of specimen other than nasopharyngeal swab, presence of viral mutation(s) within the areas targeted by this assay, and inadequate number of viral copies (<250 copies / mL). A negative result must be combined with clinical observations, patient history, and epidemiological information.  Fact Sheet for Patients:   StrictlyIdeas.no  Fact Sheet for Healthcare Providers: BankingDealers.co.za  This test is not yet approved or  cleared by the [place] FDA and has been authorized for detection and/or diagnosis of SARS-CoV-2 by FDA under an Emergency Use Authorization (EUA).  This EUA will remain in effect (meaning this test can be used) for the duration of the COVID-19 declaration under Section 564(b)(1) of the Act, 21 U.S.C. section 360bbb-3(b)(1), unless the authorization is terminated or revoked sooner.  Performed at [facility], [place] [address], [place], [place] [address]   Culture, blood (routine x 2)     Status: None (Preliminary result)   Collection Time: 05/24/20  5:00 AM   Specimen: BLOOD RIGHT WRIST  Result Value Ref Range Status   Specimen Description BLOOD RIGHT WRIST  Final   Special Requests   Final    BOTTLES DRAWN AEROBIC ONLY Blood Culture results may not be optimal due to an inadequate volume of blood received in culture bottles   Culture   Final    NO GROWTH 2 DAYS Performed at [facility], [place] [address], [place], [place] [address]    Report Status PENDING  Incomplete  Urine culture     Status: None   Collection Time: 05/24/20  5:36 AM   Specimen: Urine, Random  Result Value Ref Range Status   Specimen Description URINE, RANDOM  Final   Special Requests NONE  Final   Culture   Final    NO  GROWTH Performed at [facility], [place] [address], [place], [place] [address]    Report Status 05/25/2020 FINAL  Final     Studies: No results found.    Flora Lipps, MD  [facility] 05/27/2020

## 2020-05-27 NOTE — Care Management (Signed)
Verified w [facility] liaison that referral was placed and accepted as documented in handoff

## 2020-05-27 NOTE — Progress Notes (Signed)
Pokhrel MD notified via secure chat about the pt's increase in HR from 70 to 140s ST. Pt returned to bed and provided clean linens. Will continue to monitor and await new orders.

## 2020-05-27 NOTE — Progress Notes (Signed)
PT Cancellation Note  Patient Details Name: [name]. MRN: [id number] DOB: [birth year]   Cancelled Treatment:    Reason Eval/Treat Not Completed: Medical issues which prohibited therapy. Pt in bed on arrival, pale and ill-appearing. He reports being unable to stand with nursing this morning for orthostatic vitals. Per chart, BP 116/68 HR 85 supine, and BP 91/62 HR 140 sitting. Pt declining further attempts to mobilize today. PT to continue to follow and progress mobility as tolerated.   Lorriane Shire 05/27/2020, 11:59 AM   Lorrin Goodell, PT  Office # [phone] Pager [phone]

## 2020-05-28 DIAGNOSIS — Z66 Do not resuscitate: Secondary | ICD-10-CM

## 2020-05-28 DIAGNOSIS — C8299 Follicular lymphoma, unspecified, extranodal and solid organ sites: Secondary | ICD-10-CM

## 2020-05-28 LAB — BASIC METABOLIC PANEL
Anion gap: 7 (ref 5–15)
BUN: 23 mg/dL (ref 8–23)
CO2: 18 mmol/L — ABNORMAL LOW (ref 22–32)
Calcium: 7.3 mg/dL — ABNORMAL LOW (ref 8.9–10.3)
Chloride: 111 mmol/L (ref 98–111)
Creatinine, Ser: 1.75 mg/dL — ABNORMAL HIGH (ref 0.61–1.24)
GFR calc Af Amer: 40 mL/min — ABNORMAL LOW (ref >60)
GFR calc non Af Amer: 35 mL/min — ABNORMAL LOW (ref >60)
Glucose, Bld: 106 mg/dL — ABNORMAL HIGH (ref 70–99)
Potassium: 4.7 mmol/L (ref 3.5–5.1)
Sodium: 136 mmol/L (ref 135–145)

## 2020-05-28 LAB — CBC
HCT: 46.1 % (ref 39.0–52.0)
Hemoglobin: 15.4 g/dL (ref 13.0–17.0)
MCH: 34.2 pg — ABNORMAL HIGH (ref 26.0–34.0)
MCHC: 33.4 g/dL (ref 30.0–36.0)
MCV: 102.4 fL — ABNORMAL HIGH (ref 80.0–100.0)
Platelets: 150 10*3/uL (ref 150–400)
RBC: 4.5 MIL/uL (ref 4.22–5.81)
RDW: 16 % — ABNORMAL HIGH (ref 11.5–15.5)
WBC: 10.3 10*3/uL (ref 4.0–10.5)
nRBC: 0 % (ref 0.0–0.2)

## 2020-05-28 MED ORDER — FERROUS SULFATE 325 (65 FE) MG PO TABS
325.0000 mg | ORAL_TABLET | Freq: Every day | ORAL | Status: DC
  Administered 2020-05-29 – 2020-05-30 (×2): 325 mg via ORAL
  Filled 2020-05-28 (×2): qty 1

## 2020-05-28 NOTE — Progress Notes (Signed)
Attempted to get shift ortho.static vital signs; patient feels bad and is nauseated after a late lunch; medicated for nausea. Patient vomited the soup he ate for lunch; patient assisted, cleansed and assisted for comfort in the bed. Vomiting resolved; doesn't feel up to attempting ortho. Static vital sign collection; continue to monitor.

## 2020-05-28 NOTE — Progress Notes (Signed)
PROGRESS NOTE  [name]. MGN:[id number] DOB: 03/08/1933 DOA: 05/24/2020 PCP: Eulas Post, MD   LOS: 3 days   Brief narrative: As per HPI,  [name]. is a 86 y.o. male with medical history significant for mantle cell lymphoma, gastric cancer, chronic combined systolic and diastolic CHF, GERD, CAD, and CKD presented to the emergency department for evaluation of confusion.  Patient's wife reported that he went to bed in his usual state of health but woke with marked confusion.  He was not dysarthric but his speech was inappropriate.  Patient  had not been complaining of anything in particular, and he has no complaints in the ED. Upon arrival to the ED, patient is found to be afebrile, saturating mid 90s on room air, and with blood pressure 102/59.  EKG featured sinus rhythm with RBBB and LAFB.  Chest x-ray was negative for pulmonary disease and noncontrast head CT is negative for acute infarction, mass, or hemorrhage.  Chemistry panel is notable for albumin of 1.5, potassium 5.7, and creatinine 1.97, up from 1.34 earlier this month.  Lactic acid was normal.  Ammonia level was normal.  COVID-19 PCR was negative.  Ethanol was undetectable.    Blood cultures were drawn and the patient was considered for admission to the hospital.  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  Patient presented with acute confusion. Negative ammonia levels.Covid was negative.  CT head was negative.  Folate and vitamin B12 within normal limits. TSH slightly  elevated (will need follow up as outpatient).   Unlikely to be infection related.    No fever or leukocytosis.  Blood cultures and urine cultures negative so far  Orthostatic hypotension, near syncope.  Started on midodrine low dose on 7/31, received IV fluids. symptomatic  Acute kidney injury superimposed on CKD IIIa; hyperkalemia SCr is 1.97 in ED; was 1.34 earlier this month.   Received IV fluids.  Creatinine of 1.7 today.  Hold off  IV fluids due to heart failure.  Chronic combined systolic and diastolic CHF Compensated so far. Lower extremity swelling but albumin 1.5.  Unlikely albumin will help since patient has ongoing poor oral intake / malignancy. Diuretics on hold.  Monitor intake and output charting, Daily weights.  Dr. Harrington Challenger from cardiology had stated patient had limited options for treatment and would highly benefit from a palliative care.  Mantle cell lymphoma; gastric cancer Follows with oncology, not undergoing any treatment.  Had large portion of the stomach removed.  Patient's daughter stated that patient did have recurrence of cancer.  Not a good prognosis.  History of CAD  No chest pain.  Continue statin    History of hypertension, now with orthostatic hypotension. Hold antihypertensives, diuretics orthostatic evaluation q shift. On entresto, lasix, spironolactone at home which has been on hold. Midodrine trial since yesterday.  Debility, weakness.  Seen by physical therapy who recommended home PT on discharge.  Severe protein calorie malnutrition.  Present on admission.  Patient does have advanced malignancy and history of gastric resection contributing to malnutrition. Will add nutritional supplementation on discharge.  DVT prophylaxis: heparin injection 5,000 Units Start: 05/24/20 0600  Code Status:  Full code  Family Communication:  None today.   Palliative care has been consulted with possible hospice transition.  Status is: Inpatient  The  patient is inpatient because: Unsafe d/c plan, IV treatments appropriate due to intensity of illness or inability to take PO and orthostatic and near syncope, palliative care discussion/possible hospice at home  Dispo:  Patient From: Home  Planned Disposition: To be determined/home hospice.  Consult palliative care pending.  Expected discharge date: 05/29/20  Medically stable for discharge: No    Consultants:  Palliative care  Procedures:  No  Antibiotics:  . None  Anti-infectives (From admission, onward)   None     Subjective: Today, patient still feels very fatigued weak and tired.  Feels little dizzy.  Denies any chest pain shortness of breath or dyspnea.   Objective: Vitals:   05/28/20 0437 05/28/20 0500  BP: (!) 83/49 116/69  Pulse: 88 88  Resp: 18 18  Temp: 98 F (36.7 C) 98.1 F (36.7 C)  SpO2: 98% 100%    Intake/Output Summary (Last 24 hours) at 05/28/2020 0748 Last data filed at 05/27/2020 2354 Gross per 24 hour  Intake 3 ml  Output 250 ml  Net -247 ml   Filed Weights   05/24/20 0209 05/27/20 0500 05/28/20 0500  Weight: 82.5 kg 81.4 kg 81.2 kg   Body mass index is 24.28 kg/m.   Physical Exam:  General: Thinly built communicative.  Deconditioned and frail. HENT:   No scleral pallor or icterus noted. Oral mucosa is moist.  Chest:   Diminished breath sounds bilaterally. .  CVS: S1 &S2 heard. No murmur.  Regular rate and rhythm. Abdomen: Soft, nontender, nondistended.  Bowel sounds are heard.   Extremities: No cyanosis, clubbing but bilateral lower extremity pedal edema noted.  Peripheral pulses are palpable. Psych: Alert, awake and oriented, normal mood CNS:  No cranial nerve deficits.  Power equal in all extremities.   Skin: Warm and dry.  No rashes noted.  Data Review: I have personally reviewed the following laboratory data and studies,  CBC: Recent Labs  Lab 05/24/20 0557 05/25/20 0207 05/26/20 0321 05/27/20 0324 05/28/20 0223  WBC 9.0  8.8 8.2 8.7 10.3  NEUTROABS 3.3  --   --   --   --   HGB 15.1 14.7 14.9 15.8 15.4  HCT 44.5 43.4 43.4 45.9 46.1  MCV 99.1 101.4* 99.5 101.3* 102.4*  PLT 191 169 162 159 355   Basic Metabolic Panel: Recent Labs  Lab 05/24/20 0213 05/25/20 0207 05/26/20 0321 05/27/20 0324 05/28/20 0223  NA 136 137 139 138 136  K 5.7* 5.0 4.3 4.9 4.7  CL 106 111 111 111 111  CO2 23 18* 20* 19* 18*  GLUCOSE 96 83 87 85 106*  BUN 26* 25* 23 22 23   CREATININE 1.97* 1.89* 1.77* 1.79* 1.75*  CALCIUM 7.3* 7.3* 7.2* 7.2* 7.3*  MG  --  1.8 1.7 1.8  --   PHOS  --  4.0  --   --   --    Liver Function Tests: Recent Labs  Lab 05/24/20 0213 05/25/20 0207  AST 34 24  ALT 18 15  ALKPHOS 58 50  BILITOT 1.5* 1.5*  PROT <3.0* <3.0*  ALBUMIN 1.5* 1.3*   No results for input(s): LIPASE, AMYLASE in the last 168 hours. Recent Labs  Lab 05/24/20 0447  AMMONIA 30   Cardiac Enzymes: No results for input(s):  CKTOTAL, CKMB, CKMBINDEX, TROPONINI in the last 168 hours. BNP (last 3 results) No results for input(s): BNP in the last 8760 hours.  ProBNP (last 3 results) Recent Labs    04/19/20 1047 05/05/20 1102  PROBNP 293 320    CBG: No results for input(s): GLUCAP in the last 168 hours. Recent Results (from the past 240 hour(s))  SARS Coronavirus 2 by RT PCR (hospital order, performed in [facility] hospital lab) Nasopharyngeal Nasopharyngeal Swab     Status: None   Collection Time: 05/24/20  3:28 AM   Specimen: Nasopharyngeal Swab  Result Value Ref Range Status   SARS Coronavirus 2 NEGATIVE NEGATIVE Final    Comment: (NOTE) SARS-CoV-2 target nucleic acids are NOT DETECTED.  The SARS-CoV-2 RNA is generally detectable in upper and lower respiratory specimens during the acute phase of infection. The lowest concentration of SARS-CoV-2 viral copies this assay can detect is 250 copies / mL. A negative result does not preclude SARS-CoV-2 infection and should not be used as the sole basis for  treatment or other patient management decisions.  A negative result may occur with improper specimen collection / handling, submission of specimen other than nasopharyngeal swab, presence of viral mutation(s) within the areas targeted by this assay, and inadequate number of viral copies (<250 copies / mL). A negative result must be combined with clinical observations, patient history, and epidemiological information.  Fact Sheet for Patients:   StrictlyIdeas.no  Fact Sheet for Healthcare Providers: BankingDealers.co.za  This test is not yet approved or  cleared by the [place] FDA and has been authorized for detection and/or diagnosis of SARS-CoV-2 by FDA under an Emergency Use Authorization (EUA).  This EUA will remain in effect (meaning this test can be used) for the duration of the COVID-19 declaration under Section 564(b)(1) of the Act, 21 U.S.C. section 360bbb-3(b)(1), unless the authorization is terminated or revoked sooner.  Performed at [facility], [place] [address], [place], [place] [address]   Culture, blood (routine x 2)     Status: None (Preliminary result)   Collection Time: 05/24/20  5:00 AM   Specimen: BLOOD RIGHT WRIST  Result Value Ref Range Status   Specimen Description BLOOD RIGHT WRIST  Final   Special Requests   Final    BOTTLES DRAWN AEROBIC ONLY Blood Culture results may not be optimal due to an inadequate volume of blood received in culture bottles   Culture   Final    NO GROWTH 3 DAYS Performed at [facility], [place] [address], [place], [place] [address]    Report Status PENDING  Incomplete  Urine culture     Status: None   Collection Time: 05/24/20  5:36 AM   Specimen: Urine, Random  Result Value Ref Range Status   Specimen Description URINE, RANDOM  Final   Special Requests NONE  Final   Culture   Final    NO GROWTH Performed at [facility], [place] [address], [place], [place]  [address]    Report Status 05/25/2020 FINAL  Final     Studies: No results found.    Flora Lipps, MD  [facility] 05/28/2020
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
## 2020-05-29 ENCOUNTER — Telehealth: Payer: Self-pay | Admitting: Family Medicine

## 2020-05-29 DIAGNOSIS — Z7189 Other specified counseling: Secondary | ICD-10-CM

## 2020-05-29 DIAGNOSIS — R4182 Altered mental status, unspecified: Secondary | ICD-10-CM

## 2020-05-29 DIAGNOSIS — F418 Other specified anxiety disorders: Secondary | ICD-10-CM

## 2020-05-29 DIAGNOSIS — R4589 Other symptoms and signs involving emotional state: Secondary | ICD-10-CM

## 2020-05-29 DIAGNOSIS — C169 Malignant neoplasm of stomach, unspecified: Secondary | ICD-10-CM

## 2020-05-29 DIAGNOSIS — Z515 Encounter for palliative care: Secondary | ICD-10-CM

## 2020-05-29 LAB — COMPREHENSIVE METABOLIC PANEL
ALT: 15 U/L (ref 0–44)
AST: 19 U/L (ref 15–41)
Albumin: 1.1 g/dL — ABNORMAL LOW (ref 3.5–5.0)
Alkaline Phosphatase: 56 U/L (ref 38–126)
Anion gap: 3 — ABNORMAL LOW (ref 5–15)
BUN: 25 mg/dL — ABNORMAL HIGH (ref 8–23)
CO2: 20 mmol/L — ABNORMAL LOW (ref 22–32)
Calcium: 7.2 mg/dL — ABNORMAL LOW (ref 8.9–10.3)
Chloride: 113 mmol/L — ABNORMAL HIGH (ref 98–111)
Creatinine, Ser: 1.77 mg/dL — ABNORMAL HIGH (ref 0.61–1.24)
GFR calc Af Amer: 40 mL/min — ABNORMAL LOW (ref >60)
GFR calc non Af Amer: 34 mL/min — ABNORMAL LOW (ref >60)
Glucose, Bld: 102 mg/dL — ABNORMAL HIGH (ref 70–99)
Potassium: 4.5 mmol/L (ref 3.5–5.1)
Sodium: 136 mmol/L (ref 135–145)
Total Bilirubin: 0.9 mg/dL (ref 0.3–1.2)
Total Protein: <3 g/dL — ABNORMAL LOW (ref 6.5–8.1)

## 2020-05-29 LAB — CBC
HCT: 40.7 % (ref 39.0–52.0)
Hemoglobin: 13.8 g/dL (ref 13.0–17.0)
MCH: 34.3 pg — ABNORMAL HIGH (ref 26.0–34.0)
MCHC: 33.9 g/dL (ref 30.0–36.0)
MCV: 101.2 fL — ABNORMAL HIGH (ref 80.0–100.0)
Platelets: 141 10*3/uL — ABNORMAL LOW (ref 150–400)
RBC: 4.02 MIL/uL — ABNORMAL LOW (ref 4.22–5.81)
RDW: 15.9 % — ABNORMAL HIGH (ref 11.5–15.5)
WBC: 11.5 10*3/uL — ABNORMAL HIGH (ref 4.0–10.5)
nRBC: 0 % (ref 0.0–0.2)

## 2020-05-29 LAB — MAGNESIUM: Magnesium: 1.7 mg/dL (ref 1.7–2.4)

## 2020-05-29 LAB — CULTURE, BLOOD (ROUTINE X 2): Culture: NO GROWTH

## 2020-05-29 MED ORDER — SODIUM CHLORIDE 0.9% FLUSH
10.0000 mL | Freq: Two times a day (BID) | INTRAVENOUS | Status: DC
  Administered 2020-05-30: 20 mL
  Administered 2020-05-30 – 2020-05-31 (×2): 10 mL

## 2020-05-29 MED ORDER — LORAZEPAM 0.5 MG PO TABS
0.5000 mg | ORAL_TABLET | Freq: Four times a day (QID) | ORAL | Status: DC | PRN
  Administered 2020-05-29 – 2020-05-30 (×2): 0.5 mg via SUBLINGUAL
  Filled 2020-05-29 (×2): qty 1

## 2020-05-29 MED ORDER — MORPHINE SULFATE 10 MG/5ML PO SOLN: 2.5000 mg | ORAL | Status: DC | PRN

## 2020-05-29 MED ORDER — SODIUM CHLORIDE 0.9% FLUSH: 10.0000 mL | INTRAVENOUS | Status: DC | PRN

## 2020-05-29 MED ORDER — ONDANSETRON 4 MG PO TBDP
4.0000 mg | ORAL_TABLET | Freq: Two times a day (BID) | ORAL | Status: DC
  Administered 2020-05-29 – 2020-05-30 (×2): 4 mg via ORAL
  Filled 2020-05-29 (×2): qty 1

## 2020-05-29 NOTE — Progress Notes (Signed)
MD paged due to patient blood pressure being 98/56. No new orders at this time.

## 2020-05-29 NOTE — Progress Notes (Signed)
PT Cancellation Note and Discharge  Patient Details Name: [name]. MRN: [id number] DOB: Feb 21, 1933   Cancelled Treatment:    Reason Eval/Treat Not Completed: Per chart review, pt is to d/c home with Hospice services. Will sign off at this time. If needs change please reconsult.    Thelma Comp 05/29/2020, 2:41 PM   Rolinda Roan, PT, DPT Acute Rehabilitation Services Pager: [phone] Office: [phone]

## 2020-05-29 NOTE — Telephone Encounter (Signed)
OK 

## 2020-05-29 NOTE — Telephone Encounter (Signed)
[place]  [phone]  Needs a verbal to agree that the patient is terminally ill and that he will be the hospice attending.

## 2020-05-29 NOTE — Telephone Encounter (Signed)
Please advise 

## 2020-05-29 NOTE — Progress Notes (Addendum)
Manufacturing engineer [facility]) Hospital Liaison Note:  Referral received for hospice services at home once discharged. Spoke with patient daughter [name] on the phone to explain services and offer support.   Plan is to discharge home once medically stable at the address listed in EPIC.   Patient will need:Hospital bed, over the bedside table and wheelchair - [facility] will order.    Per daughter, patient will need ambulance transport home - please arrange at discharge.   Please send completed DNR and arrange for any comfort scripts that may be needed for symptom management so there is no lapse in patient comfort prior to hospice services beginning.   Thank you for allowing [place] to participate in this patient's care,   Gar Ponto, RN [place] (in [place]) [phone]  Patient is hospice eligible per Dr. Modesta Messing of [place]

## 2020-05-29 NOTE — TOC Progression Note (Addendum)
Transition of Care (TOC) - Progression Note    Patient Details  Name: [name]. MRN: [id number] Date of Birth: [birth year]  [facility]) CM/SW Contact  Pollie Friar, RN Phone Number: 05/29/2020, 1:14 PM  Clinical Narrative:    Family has decided to take patient home with hospice services. Choice provided to the daughter: [name] and Lonia Chimera decided on. Sherry with [facility] notified and accepted the referral. Judeen Hammans is working on getting needed DME to the home.  Per [name] the patient will need PTAR home. CM following and will arrange transport once pt d/ced and DME in the home.    Expected Discharge Plan: Home w Hospice Care Barriers to Discharge: Equipment Delay  Expected Discharge Plan and Services Expected Discharge Plan: [place]   Discharge Planning Services: CM Consult Post Acute Care Choice: Hospice, Durable Medical Equipment Living arrangements for the past 2 months: Single Family Home Expected Discharge Date: 05/25/20                                     Social Determinants of Health (SDOH) Interventions    Readmission Risk Interventions No flowsheet data found.

## 2020-05-29 NOTE — Progress Notes (Signed)
Daily Progress Note   Patient Name: [name].       Date: 05/29/2020 DOB: December 11, 1932  Age: 86 y.o. MRN#: [id number] Attending Physician: Flora Lipps, MD Primary Care Physician: Eulas Post, MD Admit Date: 05/24/2020  Reason for Consultation/Follow-up: Establishing goals of care  Subjective: Evaluated patient. He was a bit anxious. Sat up briefly. Became a little tearful over being dependent for care.  Visibly short of breath- but denied this was bothering him. Per nursing he vomited several times this morning.  Called daughter [name]- she is in discussion with Hospice now re: making plans for Hospice service at home when discharged.   Length of Stay: 4  Current Medications: Scheduled Meds:  . ferrous sulfate  325 mg Oral Q breakfast  . heparin  5,000 Units Subcutaneous Q8H  . midodrine  5 mg Oral TID WC  . ondansetron  4 mg Oral Q12H  . pantoprazole  40 mg Oral Daily  . sodium chloride flush  10-40 mL Intracatheter Q12H  . sodium chloride flush  3 mL Intravenous Q12H  . sodium chloride flush  3 mL Intravenous Q12H    Continuous Infusions: . sodium chloride      PRN Meds: sodium chloride, acetaminophen **OR** acetaminophen, LORazepam, morphine, ondansetron **OR** ondansetron (ZOFRAN) IV, senna-docusate, sodium chloride flush, sodium chloride flush  Physical Exam          Vital Signs: BP 93/66   Pulse 100   Temp 98 F (36.7 C) (Oral)   Resp 20   Ht 6' (1.829 m)   Wt 81.5 kg   SpO2 100%   BMI 24.37 kg/m  SpO2: SpO2: 100 % O2 Device: O2 Device: Room Air O2 Flow Rate:    Intake/output summary:   Intake/Output Summary (Last 24 hours) at 05/29/2020 1151 Last data filed at 05/29/2020 9480 Gross per 24 hour  Intake 120 ml  Output 100 ml  Net 20 ml    LBM: Last BM Date: 05/26/20 Baseline Weight: Weight: 82.5 kg Most recent weight: Weight: 81.5 kg       Palliative Assessment/Data: PPS: 20%      Patient Active Problem List   Diagnosis Date Noted  . Acute encephalopathy 05/24/2020  . Chronic combined systolic and diastolic CHF (congestive heart failure) ([place]) 05/24/2020  . Hyperkalemia 05/24/2020  .  Jejunostomy tube in situ ([place]) 03/16/2018  . Postoperative anemia 02/26/2018  . Weight loss 02/11/2018  . Situational depression 02/03/2018  . Generalized weakness 02/03/2018  . Nausea and vomiting   . Gastric cancer ([place]) 12/30/2017  . Malignant neoplasm of body of stomach ([place]) 12/04/2017  . Counseling regarding advanced care planning and goals of care 08/07/2017  . Drug-induced neutropenia ([place]) 06/26/2017  . Protein-calorie malnutrition, severe ([place])   . SOB (shortness of breath)   . Acute renal failure superimposed on stage 3a chronic kidney disease ([place]) 03/17/2017  . Mantle cell lymphoma of lymph nodes of multiple regions ([place]) 02/24/2017  . 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 GI bleed 02/03/2017  . Lower GI bleed 02/03/2017  . Coronary artery disease   . IFG (impaired fasting glucose) 10/03/2015  . Orthostatic hypotension 08/24/2015  . GERD (gastroesophageal reflux disease) 03/24/2014  . ACP (advance care planning) 11/02/2013  . Left upper arm pain 09/15/2012  . Routine health maintenance 10/29/2011  . HEARING LOSS 11/05/2010  . Chest pain 11/06/2009  . SKIN CANCER, RECURRENT 10/13/2007  . Hyperlipidemia 10/13/2007  . Gout 10/13/2007  . Essential hypertension 10/13/2007  . PVC (premature ventricular contraction) 10/13/2007  . PSORIASIS 10/13/2007  . OSTEOARTHRITIS, ANKLE, RIGHT 10/13/2007  . Other acquired absence of organ 10/13/2007    Palliative Care Assessment & Plan   Patient Profile: Palliative Care consult requested for goals of care discussion in this 86  y.o. male with multiple medical problems including mantle cell lymphoma s/p chemotherapy, metastatic gastric cancer (followed by Dr. Irene [name]), chronic combined systolic and diastolic CHF, GERD, CAD, CKD stage IIIa, and chronic renal insufficiency. He presented to ED with concerns of confusion per wife. Wife reported patient went to bed in his usual state of health and awaken confused. Albumin 1.5. Chest x-ray and head CT showed no acute abnormalities. Since admission patient having orthostatic hypotension and started on midodrine.   Assessment/Recommendations/Plan   GOC- d/c home with Hospice  Anxiety r/t illness state- .5mg  lorazepam sublingual q6hr prn  SOB r/t heart failure- 2.5mg  liquid morphine q2hr prn  Nausea r/t gastric cancer- ondansetron ODT 4mg  q12hr  Code Status:  DNR  Prognosis:   < 6 months  Discharge Planning:  Home with Hospice  Care plan was discussed with patient's nurse, MD and daughter  Thank you for allowing the Palliative Medicine Team to assist in the care of this patient.   Time In: 1120 Time Out: 1155 Total Time 35 minutes Prolonged Time Billed no      Greater than 50%  of this time was spent counseling and coordinating care related to the above assessment and plan.  Mariana Kaufman, AGNP-C Palliative Medicine   Please contact Palliative Medicine Team phone at [phone] for questions and concerns.

## 2020-05-29 NOTE — Progress Notes (Signed)
PROGRESS NOTE  [name]. XTK:[id number] DOB: June 05, 1933 DOA: 05/24/2020 PCP: Eulas Post, MD   LOS: 4 days   Brief narrative: As per HPI,  [name]. is a 86 y.o. male with medical history significant for mantle cell lymphoma, gastric cancer, chronic combined systolic and diastolic CHF, GERD, CAD, and CKD presented to the emergency department for evaluation of confusion.  Patient's wife reported that he went to bed in his usual state of health but woke with marked confusion.  He was not dysarthric but his speech was inappropriate.  Patient  had not been complaining of anything in particular, and he has no complaints in the ED. Upon arrival to the ED, patient is found to be afebrile, saturating mid 90s on room air, and with blood pressure 102/59.  EKG featured sinus rhythm with RBBB and LAFB.  Chest x-ray was negative for pulmonary disease and noncontrast head CT is negative for acute infarction, mass, or hemorrhage.  Chemistry panel is notable for albumin of 1.5, potassium 5.7, and creatinine 1.97, up from 1.34 earlier this month.  Lactic acid was normal.  Ammonia level was normal.  COVID-19 PCR was negative.  Ethanol was undetectable.    Blood cultures were drawn and the patient was considered for admission to the hospital.  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   Patient presented with acute confusion. Negative ammonia levels.Covid was negative.  CT head was negative.  Folate and vitamin B12 within normal limits. TSH slightly elevated (will need follow up as outpatient).   Unlikely to be infection related.    No fever or leukocytosis.  Blood cultures and urine cultures negative so far  Orthostatic hypotension, near syncope.  Started on midodrine low dose on 7/31, received IV fluids.  Very unsteady   Acute kidney injury superimposed on CKD IIIa; hyperkalemia SCr is 1.97 in ED; was 1.34 earlier this month.   Received IV fluids.  Creatinine of 1.7 today.    Chronic combined systolic and diastolic CHF Compensated so far. Lower extremity swelling but albumin 1.5.  Unlikely albumin will help since patient has ongoing poor oral intake / malignancy. Diuretics on hold.  Monitor intake and output charting, Daily weights.  Dr. Harrington Challenger from cardiology had stated patient had limited options for treatment and would highly benefit from a palliative care.  Mantle cell lymphoma; gastric cancer Follows with oncology, not undergoing any treatment.  Had large portion of the stomach removed.  Patient's daughter stated that patient did have recurrence of cancer.  Not a good prognosis.  Palliative care has been consulted and patient will be considered for home hospice  History of CAD  No chest pain.  Continue statin    History of hypertension, now with orthostatic hypotension. Hold antihypertensives, diuretics orthostatic evaluation q shift. On entresto, lasix, spironolactone at home which has been on hold.  On midodrine trial.  Debility, weakness.  Seen by physical  therapy who recommended home PT on discharge.  Now considering home hospice  Severe protein calorie malnutrition.  Present on admission.  Patient does have advanced malignancy and history of gastric resection contributing to malnutrition. Will add nutritional supplementation on discharge.   Ethics.   Due to patient's deteriorating clinical condition, heart failure, orthostatic hypotension, advanced age and recurrent cancer with severe protein calorie malnutrition, prognosis of the patient is guarded.  Palliative care has seen the patient and at this time, plan discharge at home with home hospice on discharge.  DVT prophylaxis: heparin injection 5,000 Units Start: 05/24/20 0600  Code Status:  Full code  Family Communication:  None today.     Status is: Inpatient  The patient is inpatient because: Unsafe d/c plan, IV treatments appropriate due to intensity of illness or inability to take PO and orthostatic and near syncope, palliative care discussion/possible hospice at home  Dispo:  Patient From: Home  Planned Disposition: home hospice.   Expected discharge date: 05/30/20, home hospice setup pending  Medically stable for discharge: Yes   Consultants:  Palliative care  Procedures:  No  Antibiotics:  . None  Anti-infectives (From admission, onward)   None     Subjective: Today, patient still feels very fatigued weak and tired.  Was tearful.  Palliative care at bedside.  Patient did not wish to be examined further.  Objective: Vitals:   05/29/20 0915 05/29/20 1234  BP: 93/66 (!) 98/56  Pulse: 100 77  Resp: 20 18  Temp: 98 F (36.7 C) 97.6 F (36.4 C)  SpO2: 100% 99%    Intake/Output Summary (Last 24 hours) at 05/29/2020 1312 Last data filed at 05/29/2020 1610 Gross per 24 hour  Intake --  Output 100 ml  Net -100 ml   Filed Weights   05/27/20 0500 05/28/20 0500 05/29/20 0500  Weight: 81.4 kg 81.2 kg 81.5 kg   Body mass index is 24.37 kg/m.   Physical Exam:  General: Thinly built communicative.  Deconditioned and frail. Psych: Alert, awake and tearful at times  CNS: Moving extremities. Did not wish examination   Data Review: I have personally reviewed the following laboratory data and studies,  CBC: Recent Labs  Lab 05/24/20 0557 05/24/20 0557  05/25/20 0207 05/26/20 0321 05/27/20 0324 05/28/20 0223 05/29/20 0149  WBC 9.0   < > 8.8 8.2 8.7 10.3 11.5*  NEUTROABS 3.3  --   --   --   --   --   --   HGB 15.1   < > 14.7 14.9 15.8 15.4 13.8  HCT 44.5   < > 43.4 43.4 45.9 46.1 40.7  MCV 99.1   < > 101.4* 99.5 101.3* 102.4* 101.2*  PLT 191   < > 169 162 159 150 141*   < > = values in this interval not displayed.   Basic Metabolic Panel: Recent Labs  Lab 05/25/20 0207 05/26/20 0321 05/27/20 0324 05/28/20 0223 05/29/20 0149  NA 137 139 138 136 136  K 5.0 4.3 4.9 4.7 4.5  CL 111 111 111 111 113*  CO2 18* 20* 19* 18* 20*  GLUCOSE 83 87 85 106* 102*  BUN 25* 23 22 23  25*  CREATININE 1.89* 1.77* 1.79* 1.75* 1.77*  CALCIUM 7.3* 7.2* 7.2* 7.3* 7.2*  MG 1.8 1.7 1.8  --  1.7  PHOS 4.0  --   --   --   --    Liver Function Tests: Recent Labs  Lab 05/24/20 0213 05/25/20 0207 05/29/20 0149  AST 34 24 19  ALT 18 15 15   ALKPHOS 58 50 56  BILITOT 1.5* 1.5* 0.9  PROT <3.0* <3.0* <3.0*  ALBUMIN 1.5* 1.3* 1.1*   No results for input(s): LIPASE, AMYLASE in the last 168 hours. Recent Labs  Lab 05/24/20 0447  AMMONIA 30   Cardiac Enzymes: No results for input(s): CKTOTAL, CKMB, CKMBINDEX, TROPONINI in the last 168 hours. BNP (last 3 results) No results for input(s): BNP in the last 8760 hours.  ProBNP (last 3 results) Recent Labs    04/19/20 1047 05/05/20 1102  PROBNP 293 320    CBG: No results for input(s): GLUCAP in the last 168 hours. Recent Results (from the past 240 hour(s))  SARS Coronavirus 2 by RT PCR (hospital order, performed in [facility] hospital lab) Nasopharyngeal Nasopharyngeal Swab     Status: None   Collection Time: 05/24/20  3:28 AM   Specimen: Nasopharyngeal Swab  Result Value Ref Range Status   SARS Coronavirus 2 NEGATIVE NEGATIVE Final    Comment: (NOTE) SARS-CoV-2 target nucleic acids are NOT DETECTED.  The SARS-CoV-2 RNA is generally detectable in upper and lower respiratory specimens  during the acute phase of infection. The lowest concentration of SARS-CoV-2 viral copies this assay can detect is 250 copies / mL. A negative result does not preclude SARS-CoV-2 infection and should not be used as the sole basis for treatment or other patient management decisions.  A negative result may occur with improper specimen collection / handling, submission of specimen other than nasopharyngeal swab, presence of viral mutation(s) within the areas targeted by this assay, and inadequate number of viral copies (<250 copies / mL). A negative result must be combined with clinical observations, patient history, and epidemiological information.  Fact Sheet for Patients:   StrictlyIdeas.no  Fact Sheet for Healthcare Providers: BankingDealers.co.za  This test is not yet approved or  cleared by the [place] FDA and has been authorized for detection and/or diagnosis of SARS-CoV-2 by FDA under an Emergency Use Authorization (EUA).  This EUA will remain in effect (meaning this test can be used) for the duration of the COVID-19 declaration under Section 564(b)(1) of the Act, 21 U.S.C. section 360bbb-3(b)(1), unless the authorization is terminated or revoked sooner.  Performed at [facility], [place] [address] [place], [place] [address]   Culture, blood (routine x 2)     Status: None   Collection Time: 05/24/20  5:00 AM   Specimen: BLOOD RIGHT WRIST  Result Value Ref Range Status   Specimen Description BLOOD RIGHT WRIST  Final   Special Requests   Final    BOTTLES DRAWN AEROBIC ONLY Blood Culture results may not be optimal due to an inadequate volume of blood received in culture bottles   Culture   Final    NO GROWTH 5 DAYS Performed at [facility], [place] [address] [place], [place] [address]    Report Status 05/29/2020 FINAL  Final  Urine culture     Status: None   Collection Time: 05/24/20  5:36 AM   Specimen: Urine,  Random  Result Value Ref Range Status   Specimen Description URINE, RANDOM  Final   Special Requests NONE  Final   Culture   Final    NO GROWTH Performed at [facility], [place] [address], [place], [place] [address]    Report Status 05/25/2020 FINAL  Final     Studies: No results found.    Flora Lipps, MD  [facility] 05/29/2020

## 2020-05-29 NOTE — Telephone Encounter (Signed)
Left detailed message for krystal letting her know Dr.Burchette will be attending physician

## 2020-05-30 ENCOUNTER — Inpatient Hospital Stay: Payer: Medicare Other | Admitting: Hematology

## 2020-05-30 ENCOUNTER — Other Ambulatory Visit: Payer: Self-pay | Admitting: Hematology

## 2020-05-30 LAB — C DIFFICILE QUICK SCREEN W PCR REFLEX
C Diff antigen: NEGATIVE
C Diff interpretation: NOT DETECTED
C Diff toxin: NEGATIVE

## 2020-05-30 MED ORDER — MORPHINE SULFATE (PF) 2 MG/ML IV SOLN: 2.0000 mg | INTRAVENOUS | Status: DC | PRN

## 2020-05-30 MED ORDER — LOPERAMIDE HCL 2 MG PO CAPS
4.0000 mg | ORAL_CAPSULE | Freq: Once | ORAL | Status: AC
  Administered 2020-05-30: 4 mg via ORAL
  Filled 2020-05-30: qty 2

## 2020-05-30 MED ORDER — LOPERAMIDE HCL 2 MG PO CAPS
2.0000 mg | ORAL_CAPSULE | ORAL | Status: DC | PRN
  Administered 2020-05-31: 2 mg via ORAL
  Filled 2020-05-30: qty 1

## 2020-05-30 MED ORDER — LORAZEPAM 2 MG/ML IJ SOLN
1.0000 mg | INTRAMUSCULAR | Status: DC | PRN
  Administered 2020-05-31: 1 mg via INTRAVENOUS
  Filled 2020-05-30: qty 1

## 2020-05-30 NOTE — Progress Notes (Signed)
PET/CT IMPRESSION: 1. Enlargement and mild increase in activity in the retroperitoneal and ileocolic adenopathy. These lymph nodes measure in the Deauville 4 and low Deauville 5 range currently. 2. Perirectal lymph nodes measuring up to 0.8 cm, although with only low-grade activity. 3. Scattered muscular activity, likely physiologic. 4. Continued activity in the remainder of the stomach, current maximum SUV 8.1. This is nonspecific. 5. Other imaging findings of potential clinical significance: Chronic ethmoid sinusitis. Aortic Atherosclerosis (ICD10-I70.0). Coronary atherosclerosis. Airway thickening is present, suggesting bronchitis or reactive airways disease. Mild prostatomegaly.   Electronically Signed   By: Van Clines M.D.   On: 05/17/2020 16:41   PET CT scan results reviewed with patient's daughter Ms. [name].  Patient was hospitalized with renal failure and failure to thrive and altered mental status.  Has been having significant fluid issues related to his heart and worsening performance status.  We discussed that palliative treatments for his mantle cell lymphoma would likely not change the course and burden of his symptoms related to his other medical issues.  Patient and family have chosen to proceed with hospice in the setting of worsening performance status multiple uncontrolled medical issues and progressive mantle cell and gastric cancer.  I noted that this is a very reasonable choice.  Questions about the PET scan and prognosis and overall goals of care were addressed .  [name] KitchenBrunetta Genera MD MS

## 2020-05-30 NOTE — TOC Progression Note (Addendum)
Transition of Care (TOC) - Progression Note    Patient Details  Name: [name]. MRN: [id number] Date of Birth: [birth year]  Transition of Care [facility]) CM/SW Contact  Pollie Friar, RN Phone Number: 05/30/2020, 10:39 AM  Clinical Narrative:    CM received a phone call from pts daughter: [name] this am and she is interested in [employer] over home. She doesn't feel with patients current condition that her mother will be able to provide the care her father needs. CM provided her the number for [facility] rep: Bevely Palmer so daughter could discuss her options.  TOC following.  1400: [facility] can accept the patient tomorrow am. MD updated. Bevely Palmer to call pts daughter.   Expected Discharge Plan: Home w Hospice Care Barriers to Discharge: Equipment Delay  Expected Discharge Plan and Services Expected Discharge Plan: [place]   Discharge Planning Services: CM Consult Post Acute Care Choice: Hospice, Durable Medical Equipment Living arrangements for the past 2 months: Single Family Home Expected Discharge Date: 05/25/20                                     Social Determinants of Health (SDOH) Interventions    Readmission Risk Interventions No flowsheet data found.

## 2020-05-30 NOTE — Progress Notes (Addendum)
PROGRESS NOTE  [name]. WER:[id number] DOB: 1932/12/26 DOA: 05/24/2020 PCP: Eulas Post, MD   LOS: 5 days   Brief narrative: As per HPI,  [name]. is a 86 y.o. male with medical history significant for mantle cell lymphoma, gastric cancer, chronic combined systolic and diastolic CHF, GERD, CAD, and CKD presented to the emergency department for evaluation of confusion.  Patient's wife reported that he went to bed in his usual state of health but woke with marked confusion.  He was not dysarthric but his speech was inappropriate.  Patient  had not been complaining of anything in particular, and he has no complaints in the ED. Upon arrival to the ED, patient is found to be afebrile, saturating mid 90s on room air, and with blood pressure 102/59.  EKG featured sinus rhythm with RBBB and LAFB.  Chest x-ray was negative for pulmonary disease and noncontrast head CT is negative for acute infarction, mass, or hemorrhage.  Chemistry panel is notable for albumin of 1.5, potassium 5.7, and creatinine 1.97, up from 1.34 earlier this month.  Lactic acid was normal.  Ammonia level was normal.  COVID-19 PCR was negative.  Ethanol was undetectable.    Blood cultures were drawn and the patient was considered for admission to the hospital.  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   Patient presented with acute confusion. Negative ammonia levels.Covid was negative.  CT head was negative.  Folate and vitamin B12 within normal limits. TSH slightly elevated (will need follow up as outpatient).   Unlikely to be infection related.    No fever or leukocytosis.  Blood cultures and urine cultures negative.  Diarrheal episodes.  C. difficile screen was negative.  GI pathogen panel pending.  Will give loperamide today.  Patient feels miserable from diarrhea.    Orthostatic hypotension, near syncope.  Initially given IV fluids followed by midodrine from 7/31.  Very unsteady, symptomatic.   Acute kidney injury superimposed on CKD IIIa; hyperkalemia SCr was 1.97 in ED; was 1.34 earlier this month.   Received IV fluids.  Creatinine of 1.7 today.    Chronic combined systolic and diastolic CHF Compensated so far. Lower extremity swelling but albumin 1.5.  Unlikely albumin will help since patient has ongoing poor oral intake / malignancy. Diuretics on hold.  Monitor intake and output charting, Daily weights.  Dr. Harrington Challenger from cardiology on the phone stated patient had limited options for treatment and would highly benefit from a palliative care.  Mantle cell lymphoma; gastric cancer Follows with oncology, not undergoing any treatment.  Had large portion of the stomach removed.  Patient's daughter stated that patient did have recurrence of cancer.  Not a good prognosis.  Palliative care has been consulted and patient has been considered for home hospice  History of CAD  No chest pain.  Continue statin    History of hypertension, now with orthostatic hypotension. Hold antihypertensives,  diuretics. Orthostatic evaluation q shift. On entresto, lasix, spironolactone at home which has been on hold.  On midodrine trial.  Debility, weakness.  Seen by physical therapy who recommended home PT on discharge.  Now considering hospice level of care.  Severe protein calorie malnutrition.   Present on admission.  Patient does have advanced malignancy and history of gastric resection contributing to malnutrition. Will add nutritional supplementation on discharge.   Ethics.  Due to patient's deteriorating clinical condition, heart failure, orthostatic hypotension, advanced age, recurrent cancer with severe protein calorie malnutrition, prognosis of the patient is guarded.  Palliative care has seen the patient and at this time, plan discharge with hospice .  DVT prophylaxis: heparin injection 5,000 Units Start: 05/24/20 0600  Code Status: DNR  Family Communication: Palliative care on board. Spoke with daughter [name] on 8/2 about the clinical condition of the patient and discussed about palliative care hospice.  Status is: Inpatient  The patient is inpatient because:  orthostatic and near syncope, palliative care discussion/possible hospice at home  Dispo:  Patient From: Home  Planned Disposition: home hospice/residential hospice.  Patient's daughter wishes residential hospice.  Transition of care on board.  Expected discharge date: 05/31/20 if hospice arrangement is done  Medically stable for discharge: Yes for hospice level of care   Consultants:  Palliative care  Procedures:  No  Antibiotics:  . None  Anti-infectives (From admission, onward)   None     Subjective: Today, patient was seen and examined at bedside.  Feels fatigued tired and weak and tearful.  Feels miserable about his condition including 1 episode of diarrhea today.  Objective: Vitals:   05/30/20 0755 05/30/20 1133  BP: (!) 99/54 97/64  Pulse: 80 90  Resp: 20 20  Temp: 98.7 F (37.1 C) 97.7 F (36.5 C)  SpO2: 100% 100%   No intake or output data in the 24 hours ending 05/30/20 1319 Filed Weights   05/27/20 0500 05/28/20 0500 05/29/20 0500  Weight: 81.4 kg 81.2 kg 81.5 kg   Body mass index is 24.37 kg/m.   Physical Exam:  General: Thinly built built, not in obvious distress,  deconditioned and frail.  Tearful and anxious. HENT:   No scleral pallor or icterus noted. Oral mucosa is moist.  Chest:  Clear breath sounds.  Diminished breath sounds bilaterally. No crackles or wheezes.  CVS: S1 &S2 heard. No murmur.  Regular rate and rhythm. Abdomen: Soft, nontender, nondistended.  Bowel sounds are heard.   Extremities: No cyanosis, clubbing or edema.  Peripheral pulses are palpable. Psych: Alert, awake and communicative. CNS:  No cranial nerve deficits.  Moves all extremities. Skin: Warm and dry.  No rashes noted.  Data Review: I have personally reviewed the following laboratory data and studies,  CBC: Recent Labs  Lab 05/24/20 0557 05/24/20 0557 05/25/20 0207 05/26/20 0321 05/27/20 0324 05/28/20 0223 05/29/20 0149  WBC 9.0   < > 8.8 8.2 8.7 10.3 11.5*  NEUTROABS 3.3  --   --   --   --   --   --   HGB 15.1   < > 14.7 14.9 15.8 15.4 13.8  HCT 44.5   < > 43.4 43.4 45.9 46.1 40.7  MCV 99.1   < > 101.4* 99.5 101.3* 102.4* 101.2*  PLT 191   < > 169 162 159 150 141*   < > = values in this interval not displayed.   Basic Metabolic Panel: Recent Labs  Lab 05/25/20 0207 05/26/20 0321 05/27/20 0350  05/28/20 0223 05/29/20 0149  NA 137 139 138 136 136  K 5.0 4.3 4.9 4.7 4.5  CL 111 111 111 111 113*  CO2 18* 20* 19* 18* 20*  GLUCOSE 83 87 85 106* 102*  BUN 25* 23 22 23  25*  CREATININE 1.89* 1.77* 1.79* 1.75* 1.77*  CALCIUM 7.3* 7.2* 7.2* 7.3* 7.2*  MG 1.8 1.7 1.8  --  1.7  PHOS 4.0  --   --   --   --    Liver Function Tests: Recent Labs  Lab 05/24/20 0213 05/25/20 0207 05/29/20 0149  AST 34 24 19  ALT 18 15 15   ALKPHOS 58 50 56  BILITOT 1.5* 1.5* 0.9  PROT <3.0* <3.0* <3.0*  ALBUMIN 1.5* 1.3* 1.1*   No results for input(s): LIPASE, AMYLASE in the last 168 hours. Recent Labs  Lab 05/24/20 0447  AMMONIA 30   Cardiac Enzymes: No results for input(s): CKTOTAL, CKMB, CKMBINDEX, TROPONINI in the last 168 hours. BNP (last 3 results) No results  for input(s): BNP in the last 8760 hours.  ProBNP (last 3 results) Recent Labs    04/19/20 1047 05/05/20 1102  PROBNP 293 320    CBG: No results for input(s): GLUCAP in the last 168 hours. Recent Results (from the past 240 hour(s))  SARS Coronavirus 2 by RT PCR (hospital order, performed in [facility] hospital lab) Nasopharyngeal Nasopharyngeal Swab     Status: None   Collection Time: 05/24/20  3:28 AM   Specimen: Nasopharyngeal Swab  Result Value Ref Range Status   SARS Coronavirus 2 NEGATIVE NEGATIVE Final    Comment: (NOTE) SARS-CoV-2 target nucleic acids are NOT DETECTED.  The SARS-CoV-2 RNA is generally detectable in upper and lower respiratory specimens during the acute phase of infection. The lowest concentration of SARS-CoV-2 viral copies this assay can detect is 250 copies / mL. A negative result does not preclude SARS-CoV-2 infection and should not be used as the sole basis for treatment or other patient management decisions.  A negative result may occur with improper specimen collection / handling, submission of specimen other than nasopharyngeal swab, presence of viral mutation(s) within the areas targeted by this assay, and inadequate number of viral copies (<250 copies / mL). A negative result must be combined with clinical observations, patient history, and epidemiological information.  Fact Sheet for Patients:   StrictlyIdeas.no  Fact Sheet for Healthcare Providers: BankingDealers.co.za  This test is not yet approved or  cleared by the [place] FDA and has been authorized for detection and/or diagnosis of SARS-CoV-2 by FDA under an Emergency Use Authorization (EUA).  This EUA will remain in effect (meaning this test can be used) for the duration of the COVID-19 declaration under Section 564(b)(1) of the Act, 21 U.S.C. section 360bbb-3(b)(1), unless the authorization is terminated or revoked  sooner.  Performed at [facility], [place] [address], [place], [place] [address]   Culture, blood (routine x 2)     Status: None   Collection Time: 05/24/20  5:00 AM   Specimen: BLOOD RIGHT WRIST  Result Value Ref Range Status   Specimen Description BLOOD RIGHT WRIST  Final   Special Requests   Final    BOTTLES DRAWN AEROBIC ONLY Blood Culture results may not be optimal due to an inadequate volume of blood received in culture bottles   Culture   Final    NO GROWTH 5 DAYS Performed at [facility], [place] [address], [place], [place] [address]  Report Status 05/29/2020 FINAL  Final  Urine culture     Status: None   Collection Time: 05/24/20  5:36 AM   Specimen: Urine, Random  Result Value Ref Range Status   Specimen Description URINE, RANDOM  Final   Special Requests NONE  Final   Culture   Final    NO GROWTH Performed at [facility], [address], [place], [place] [address]    Report Status 05/25/2020 FINAL  Final  C Difficile Quick Screen w PCR reflex     Status: None   Collection Time: 05/30/20  4:48 AM   Specimen: Stool  Result Value Ref Range Status   C Diff antigen NEGATIVE NEGATIVE Final   C Diff toxin NEGATIVE NEGATIVE Final   C Diff interpretation No C. difficile detected.  Final    Comment: Performed at [facility], [place] [address] [place], [place] [address]     Studies: No results found.    Flora Lipps, MD  [facility] 05/30/2020

## 2020-05-30 NOTE — Progress Notes (Signed)
Manufacturing engineer [facility]) Hospital Liaison note.     This patient has been approved to transfer to [facility] on Wednesday 8/4.     [facility] will notify TOC when registration paperwork has been completed to arrange transport.   RN please call report to [phone].   Thank you,     Farrel Gordon, RN, CCM       [place] (listed on [place] [facility])     [phone]

## 2020-05-30 NOTE — Progress Notes (Signed)
Patient had 4 watery stool for the past two hours.  patiene denied any abdominal pain,  Doctor oncall notified, order to collect stool for C diff and Gastrointestinal panel.  Patient placed on enteric precaution.

## 2020-05-30 NOTE — Progress Notes (Signed)
Daily Progress Note   Patient Name: [name].       Date: 05/30/2020 DOB: 04-24-1933  Age: 86 y.o. MRN#: [id number] Attending Physician: Flora Lipps, MD Primary Care Physician: Eulas Post, MD Admit Date: 05/24/2020  Reason for Consultation/Follow-up: Establishing goals of care  Subjective: Patient with increasing loose stools over night. Sleeping at time of my assessment- per nursing when awake he is tearful, complains of malaise. He is not eating or drinking. Noted discussion had with family and plan now to go to [facility].  Called patient's daughter- gave support and discussed patient's plan of care in context of what his overall goals of care would be. [name] was a very independent gentleman and at this time in his life he would want dignity and comfort.   Length of Stay: 5  Current Medications: Scheduled Meds:  . midodrine  5 mg Oral TID WC  . pantoprazole  40 mg Oral Daily  . sodium chloride flush  10-40 mL Intracatheter Q12H  . sodium chloride flush  3 mL Intravenous Q12H  . sodium chloride flush  3 mL Intravenous Q12H    Continuous Infusions: . sodium chloride      PRN Meds: sodium chloride, acetaminophen **OR** acetaminophen, loperamide, LORazepam, LORazepam, morphine, morphine injection, ondansetron **OR** ondansetron (ZOFRAN) IV, sodium chloride flush, sodium chloride flush   Vital Signs: BP (!) 102/49 (BP Location: Right Arm)   Pulse 80   Temp 97.7 F (36.5 C) (Oral)   Resp 16   Ht 6' (1.829 m)   Wt 81.5 kg   SpO2 100%   BMI 24.37 kg/m  SpO2: SpO2: 100 % O2 Device: O2 Device: Room Air O2 Flow Rate:    Intake/output summary:   Intake/Output Summary (Last 24 hours) at 05/30/2020 1739 Last data filed at 05/30/2020 1400 Gross per 24  hour  Intake --  Output 100 ml  Net -100 ml   LBM: Last BM Date: 05/30/20 Baseline Weight: Weight: 82.5 kg Most recent weight: Weight: 81.5 kg       Palliative Assessment/Data: PPS: 20%      Patient Active Problem List   Diagnosis Date Noted  . Palliative care by specialist   . Anxiety about health   . Altered mental status   . Goals of care, counseling/discussion   .  Advanced care planning/counseling discussion   . Acute encephalopathy 05/24/2020  . Chronic combined systolic and diastolic CHF (congestive heart failure) ([place]) 05/24/2020  . Hyperkalemia 05/24/2020  . Jejunostomy tube in situ ([place]) 03/16/2018  . Postoperative anemia 02/26/2018  . Weight loss 02/11/2018  . Situational depression 02/03/2018  . Generalized weakness 02/03/2018  . Nausea and vomiting   . Gastric cancer ([place]) 12/30/2017  . Malignant neoplasm of body of stomach ([place]) 12/04/2017  . Counseling regarding advanced care planning and goals of care 08/07/2017  . Drug-induced neutropenia ([place]) 06/26/2017  . Protein-calorie malnutrition, severe ([place])   . SOB (shortness of breath)   . Acute renal failure superimposed on stage 3a chronic kidney disease ([place]) 03/17/2017  . 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 GI bleed 02/03/2017  . Lower GI bleed 02/03/2017  . Coronary artery disease   . IFG (impaired fasting glucose) 10/03/2015  . Orthostatic hypotension 08/24/2015  . GERD (gastroesophageal reflux disease) 03/24/2014  . ACP (advance care planning) 11/02/2013  . Left upper arm pain 09/15/2012  . Routine health maintenance 10/29/2011  . HEARING LOSS 11/05/2010  . Chest pain 11/06/2009  . SKIN CANCER, RECURRENT 10/13/2007  . Hyperlipidemia 10/13/2007  . Gout 10/13/2007  . Essential hypertension 10/13/2007  . PVC (premature ventricular contraction) 10/13/2007  . PSORIASIS 10/13/2007   . OSTEOARTHRITIS, ANKLE, RIGHT 10/13/2007  . Other acquired absence of organ 10/13/2007    Palliative Care Assessment & Plan   Patient Profile: Palliative Care consult requested for goals of care discussion in this87 y.o.malewith multiple medical problems including mantle cell lymphoma s/p chemotherapy, metastatic gastric cancer (followed by Dr. [name] combined systolic and diastolic CHF, GERD, CAD,CKD stage IIIa,and chronic renal insufficiency. He presented to ED with concerns of confusion per wife. Wife reported patient went to bed in his usual state of health and awaken confused. Albumin 1.5. Chest x-ray and head CT showed no acute abnormalities. Since admission patient having orthostatic hypotension and started on midodrine.  Assessment/Recommendations/Plan   Discharge to [facility] tomorrow  I have ordered IV medications due to patient's nausea and gastric cancer- recommend continuing midline at discharge so it can be used at [facility]  Medications and labs not intended for comfort have been discontinued  Goals of Care and Additional Recommendations:  Limitations on Scope of Treatment: Full Comfort Care  Code Status:  DNR  Prognosis:   < 2 weeks due to gastric cancer, fluid/orthostatic instability, poor po intake, full transition to comfort measures only  Discharge Planning:  [place] was discussed with patient's daughter, [name].  Thank you for allowing the Palliative Medicine Team to assist in the care of this patient.   Time In: 0945, 1645 Time Out: 1000, 1705 Total Time 35 minutes Prolonged Time Billed no      Greater than 50%  of this time was spent counseling and coordinating care related to the above assessment and plan.  Mariana Kaufman, AGNP-C Palliative Medicine   Please contact Palliative Medicine Team phone at [phone] for questions and concerns.

## 2020-05-31 DIAGNOSIS — Z515 Encounter for palliative care: Secondary | ICD-10-CM

## 2020-05-31 MED ORDER — MORPHINE SULFATE 10 MG/5ML PO SOLN
2.5000 mg | ORAL | Status: DC
  Filled 2020-05-31: qty 2

## 2020-05-31 MED ORDER — LORAZEPAM 1 MG PO TABS
2.0000 mg | ORAL_TABLET | Freq: Four times a day (QID) | ORAL | Status: DC
  Filled 2020-05-31: qty 2

## 2020-05-31 NOTE — Progress Notes (Signed)
Daily Progress Note   Patient Name: [name].       Date: 05/31/2020 DOB: 1933/08/05  Age: 86 y.o. MRN#: [id number] Attending Physician: Jacob Hillock, MD Primary Care Physician: Jacob Post, MD Admit Date: 05/24/2020  Reason for Consultation/Follow-up: Establishing goals of care and Non pain symptom management  Subjective: Patient in bed, c/o being cold. Asking for "mama".  Tearful. Denies pain-  Just says, "I don't know what to do, I can't walk".  Gave support, covered with warm blankets- requested lorazepam from RN. He became more oriented and calmed with lorazepam. We discussed his worries re: where he is going, his care, his loss of control over his body. Discussed  [name] with him, described it for him. He was thankful.  C/O peripheral IV hurting. DC'd it.  Review of Systems  Unable to perform ROS: Acuity of condition    Length of Stay: 6  Current Medications: Scheduled Meds:  . LORazepam  2 mg Oral Q6H  . morphine  2.5 mg Oral Q4H  . pantoprazole  40 mg Oral Daily  . sodium chloride flush  10-40 mL Intracatheter Q12H  . sodium chloride flush  3 mL Intravenous Q12H  . sodium chloride flush  3 mL Intravenous Q12H    Continuous Infusions: . sodium chloride      PRN Meds: sodium chloride, acetaminophen **OR** acetaminophen, loperamide, ondansetron **OR** ondansetron (ZOFRAN) IV, sodium chloride flush, sodium chloride flush  Physical Exam Vitals and nursing note reviewed.  Constitutional:      Appearance: He is ill-appearing.     Comments: frail  Skin:    Coloration: Skin is pale.             Vital Signs: BP 127/71 (BP Location: Right Arm)   Pulse 87   Temp 98.3 F (36.8 C) (Oral)   Resp 16   Ht 6' (1.829 m)   Wt 81.5 kg   SpO2 95%    BMI 24.37 kg/m  SpO2: SpO2: 95 % O2 Device: O2 Device: Room Air O2 Flow Rate:    Intake/output summary:   Intake/Output Summary (Last 24 hours) at 05/31/2020 1110 Last data filed at 05/30/2020 1400 Gross per 24 hour  Intake --  Output 100 ml  Net -100 ml   LBM: Last BM Date: 05/30/20 Baseline  Weight: Weight: 82.5 kg Most recent weight: Weight: 81.5 kg       Palliative Assessment/Data: PPS: 20%      Patient Active Problem List   Diagnosis Date Noted  . Palliative care by specialist   . Anxiety about health   . Altered mental status   . Goals of care, counseling/discussion   . Advanced care planning/counseling discussion   . Acute encephalopathy 05/24/2020  . Chronic combined systolic and diastolic CHF (congestive heart failure) ([name]) 05/24/2020  . Hyperkalemia 05/24/2020  . Jejunostomy tube in situ ([name]) 03/16/2018  . Postoperative anemia 02/26/2018  . Weight loss 02/11/2018  . Situational depression 02/03/2018  . Generalized weakness 02/03/2018  . Nausea and vomiting   . Gastric cancer ([name]) 12/30/2017  . Malignant neoplasm of body of stomach ([name]) 12/04/2017  . Counseling regarding advanced care planning and goals of care 08/07/2017  . Drug-induced neutropenia ([name]) 06/26/2017  . Protein-calorie malnutrition, severe ([name])   . SOB (shortness of breath)   . Acute renal failure superimposed on stage 3a chronic kidney disease ([name]) 03/17/2017  . 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 GI bleed 02/03/2017  . Lower GI bleed 02/03/2017  . Coronary artery disease   . IFG (impaired fasting glucose) 10/03/2015  . Orthostatic hypotension 08/24/2015  . GERD (gastroesophageal reflux disease) 03/24/2014  . ACP (advance care planning) 11/02/2013  . Left upper arm pain 09/15/2012  . Routine health maintenance 10/29/2011  . HEARING LOSS 11/05/2010  . Chest pain  11/06/2009  . SKIN CANCER, RECURRENT 10/13/2007  . Hyperlipidemia 10/13/2007  . Gout 10/13/2007  . Essential hypertension 10/13/2007  . PVC (premature ventricular contraction) 10/13/2007  . PSORIASIS 10/13/2007  . OSTEOARTHRITIS, ANKLE, RIGHT 10/13/2007  . Other acquired absence of organ 10/13/2007    Palliative Care Assessment & Plan   Patient Profile: Palliative Care consult requested for goals of care discussion in this87 y.o.malewith multiple medical problems including mantle cell lymphoma s/p chemotherapy, metastatic gastric cancer (followed by [name] combined systolic and diastolic CHF, GERD, CAD,CKD stage IIIa,and chronic renal insufficiency. He presented to ED with concerns of confusion per wife. Wife reported patient went to bed in his usual state of health and awaken confused. Albumin 1.5. Chest x-ray and head CT showed no acute abnormalities. Since admission patient having orthostatic hypotension and started on midodrine. Assessment/Recommendations/Plan   Transfer to [facility] today  Start schedule sublingual lorazepam and morphine for overall feelings of anxiety and malaise.  Goals of Care and Additional Recommendations:  Limitations on Scope of Treatment: Full Comfort Care  Code Status:  DNR  Prognosis:   < 2 weeks  Discharge Planning:  Hospice facility  Care plan was discussed with patient and nurse.  Thank you for allowing the Palliative Medicine Team to assist in the care of this patient.   Time In: 1030 Time Out: 1105 Total Time 35 minutes Prolonged Time Billed no      Greater than 50%  of this time was spent counseling and coordinating care related to the above assessment and plan.  [name], AGNP-C Palliative Medicine   Please contact Palliative Medicine Team phone at [phone] for questions and concerns.

## 2020-05-31 NOTE — Discharge Summary (Signed)
Physician Discharge Summary  [name]. WUJ:[id number] DOB: September 19, 1933 DOA: 05/24/2020  PCP: Eulas Post, MD  Admit date: 05/24/2020 Discharge date: 05/31/2020  Time spent: 50 minutes  Recommendations for Outpatient Follow-up:  1. 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)   Acute renal failure superimposed on stage 3a chronic kidney disease (HCC)   Protein-calorie malnutrition, severe (HCC)   Malignant neoplasm of body of stomach (HCC)   Chronic combined systolic and diastolic CHF (congestive heart failure) (HCC)   Hyperkalemia   Palliative care by specialist   Anxiety about health   Altered mental status   Goals of care, counseling/discussion   Advanced care planning/counseling discussion   Comfort measures only status   Discharge Condition: Stable  Diet recommendation: Comfort diet  Filed Weights   05/27/20 0500 05/28/20 0500 05/29/20 0500  Weight: 81.4 kg 81.2 kg 81.5 kg    History of present illness:  86 year old male with a history of mantle cell lymphoma, gastric cancer, chronic combined systolic and diastolic CHF, GERD, CAD presents to the ED with complaints of confusion.  He was not dysarthric but speech was inappropriate.  Patient was found to be afebrile in the ED, chest x-ray was negative for pulmonary disease, noncontrast head CT was negative for acute infarction, hemorrhage or mass.  Ammonia was normal.  COVID-19 PCR was negative.  Alcohol was undetectable.  Hospital Course:  Acute metabolic encephalopathy-resolved, patient presented with acute encephalopathy.  Ammonia level was normal.  COVID-19 was negative.  CTA was negative.  Folate and B12 level was within normal limits.  TSH was slightly elevated.  Blood and urine cultures were negative. Diarrhea-C. difficile screen was  negative. Treated  symptomatically with loperamide for diarrhea. Orthostatic hypotension/near syncope-initially was given IV fluids, followed by midodrine.  Blood pressure is stable. Chronic combined systolic and diastolic congestive heart failure-patient presented with lower extremity swelling, albumin 1.5.  Diuretics were on hold.  Cardiology was consulted on phone, Dr. Harrington Challenger recommended palliative care. Mantle cell lymphoma/gastric cancer-followed by oncology, not undergoing treatment.  Had large portion of stomach removed.  Patient's daughter stated that patient did have recurrence of cancer.  Poor prognosis.  Palliative care consult consulted.  Also patient's daughter had discussion with oncologist Dr. Irene Limbo, who said that palliative treatments for mantle cell lymphoma would not likely change the course and burden of symptoms due to the medical issues.  Family chose residential hospice in the setting of worsening performance status post multiple uncontrolled medical issues, progressive mental cell lymphoma and gastric cancer.  Procedures:  Consultations:  Palliative care  Discharge Exam: Vitals:   05/31/20 0735 05/31/20 1118  BP: 127/71 114/71  Pulse: 87 88  Resp: 16 16  Temp: 98.3 F (36.8 C) 98.2 F (36.8 C)  SpO2: 95% 98%    General: Appears in no acute distress Cardiovascular: S1-S2, regular Respiratory: Clear to auscultation bilaterally  Discharge Instructions   Discharge Instructions    Increase activity slowly   Complete by: As directed      Allergies as of 05/31/2020      Reactions   Niacin Hives      Medication List    STOP taking these medications   calcium carbonate 500 MG chewable tablet Commonly known as: TUMS - dosed in mg elemental calcium   Entresto 24-26 MG Generic drug:  sacubitril-valsartan   ferrous sulfate 325 (65 FE) MG tablet   Nitrostat 0.4 MG SL tablet Generic drug: nitroGLYCERIN   ondansetron 4 MG disintegrating tablet Commonly known  as: ZOFRAN-ODT   pantoprazole 40 MG tablet Commonly known as: PROTONIX   rosuvastatin 10 MG tablet Commonly known as: CRESTOR     TAKE these medications   acetaminophen 325 MG tablet Commonly known as: TYLENOL Take 650 mg by mouth every 4 (four) hours as needed for mild pain or moderate pain.   furosemide 40 MG tablet Commonly known as: LASIX Take 1 tablet (40 mg total) by mouth every other day.   spironolactone 50 MG tablet Commonly known as: Aldactone Take 1 tablet (50 mg total) by mouth daily.      Allergies  Allergen Reactions  . Niacin Hives    Follow-up Information    Eulas Post, MD. Schedule an appointment as soon as possible for a visit in 1 week(s).   Specialty: Family Medicine Why: check blood work, regular followup Contact information: [place] [address] [phone]        Fay Records, MD .   Specialty: Cardiology Contact information: [place] [address] [phone]        [facility] Follow [facility].   Specialty: Home Health Services Why: Representative from [place] will contact you to schedule start of services.               The results of significant diagnostics from this hospitalization (including imaging, microbiology, ancillary and laboratory) are listed below for reference.    Significant Diagnostic Studies: CT Head Wo Contrast  Result Date: 05/24/2020 CLINICAL DATA:  Altered mental status.  Mantle cell lymphoma EXAM: CT HEAD WITHOUT CONTRAST TECHNIQUE: Contiguous axial images were obtained from the base of the skull through the vertex without intravenous contrast. COMPARISON:  January 02, 2018 FINDINGS: Brain: Diffuse atrophy is stable compared to previous study. There is no intracranial mass, hemorrhage, extra-axial fluid collection, or midline shift. Patchy small vessel disease in the centra semiovale bilaterally is stable. There is no  evident acute infarct. Vascular: No hyperdense vessel. Trace vascular calcification in the carotid siphon regions. Skull: Bony calvarium appears intact. Sinuses/Orbits: There is opacification in several ethmoid air cells as well as apparent retention cyst in the inferior left frontal sinus. Orbits appear symmetric bilaterally except for apparent cataract removal on the left. Other: Mastoid air cells are clear. IMPRESSION: Stable diffuse atrophy with patchy periventricular small vessel disease. No acute infarct appreciable. No mass or hemorrhage. Foci of paranasal sinus disease noted. Electronically Signed   By: Lowella Grip III M.D.   On: 05/24/2020 05:02   US RENAL  Result Date: 05/24/2020 CLINICAL DATA:  Acute renal failure superimposed on stage III chronic renal disease. Mantle cell lymphoma EXAM: RENAL / URINARY TRACT ULTRASOUND COMPLETE COMPARISON:  PET-CT examination is January 11, 2020 and May 17, 2020 FINDINGS: Right Kidney: Renal measurements: 8.2 x 3.9 x 4.3 cm = volume: 72.0 mL . Echogenicity within normal limits. There is renal cortical thinning. No mass, perinephric fluid, or hydronephrosis visualized. No sonographically demonstrable calculus or ureterectasis. Left Kidney: Renal measurements: 10.6 x 4.4 x 3.3 cm = volume: 81.3 mL. Echogenicity and renal cortical thickness are within normal limits. No mass, perinephric fluid, or hydronephrosis visualized. No sonographically demonstrable calculus or ureterectasis. Bladder: Appears normal for degree of bladder distention. Other: Prostate measures 5.7 x 4.3 x 3.6 cm  with a measured volume of 48.8 mL. IMPRESSION: Right kidney is somewhat atrophic in appearance. No obstructing focus in either kidney. Kidneys otherwise unremarkable in appearance. Prostate mildly prominent. Electronically Signed   By: Lowella Grip III M.D.   On: 05/24/2020 07:04   NM PET Image Restag (PS) Skull Base To Thigh  Result Date: 05/17/2020 CLINICAL DATA:  Subsequent  treatment strategy for mantle cell lymphoma. Gastric adenocarcinoma. EXAM: NUCLEAR MEDICINE PET SKULL BASE TO THIGH TECHNIQUE: 9.1 mCi F-18 FDG was injected intravenously. Full-ring PET imaging was performed from the skull base to thigh after the radiotracer. CT data was obtained and used for attenuation correction and anatomic localization. Fasting blood glucose: 93 mg/dl COMPARISON:  Multiple exams, including 01/11/2020 FINDINGS: Mediastinal blood pool activity: SUV max 2.4 Liver activity: SUV max 3.1 NECK: Muscular activity in the neck considered physiologic. There is some asymmetry of glottic activity with the right cord having maximum SUV of about 10.0 posteriorly in the left focal cord about 6.8, without CT correlate, probably physiologic/incidental related to phonation. Incidental CT findings: Mild chronic ethmoid sinusitis. CHEST: Muscular activity noted, no hypermetabolic nodal activity is observed. Incidental CT findings: Coronary, aortic arch, and branch vessel atherosclerotic vascular disease. Bulla noted posteriorly in both lower lobes. Mild bilateral airway thickening. ABDOMEN/PELVIS: Postoperative findings in the stomach with diffuse activity in the remaining stomach, maximum SUV 8.1 and previously 7.6. Scattered physiologic activity in bowel. Worsening retroperitoneal adenopathy, with both enlargement and accentuation of metabolic activity. Aortocaval node 2.4 cm in short axis (formerly 1.7 cm) with maximum SUV 6.6 (formerly 6.4). This is currently Deauville 5. Conglomerate left periaortic adenopathy 2.3 cm in short axis on image 117/4 (formerly 1.5 cm), maximum SUV 6.1 (formerly 5.6. This is currently Deauville 4. Progressive right ileocolic adenopathy observed, representative node 2.6 cm in short axis on image 121/4 (formerly 1.1 cm) with maximum SUV 7.1 (formerly 4.4) this currently Deauville 5. Other central mesenteric lymph nodes are observed. Stranding along with mild perirectal adenopathy, a  left perirectal node measures 0.8 cm in short axis on image 167/4 (formerly 0.7 cm) but with maximum SUV of only 1.9 (dorsal 2). Incidental CT findings: Aortoiliac atherosclerotic vascular disease. Non rotated left kidney. Bilateral mild renal atrophy. Mild prostatomegaly. SKELETON: No significant abnormal hypermetabolic activity in this region. Activity in left hand is injection related. Incidental CT findings: none IMPRESSION: 1. Enlargement and mild increase in activity in the retroperitoneal and ileocolic adenopathy. These lymph nodes measure in the Deauville 4 and low Deauville 5 range currently. 2. Perirectal lymph nodes measuring up to 0.8 cm, although with only low-grade activity. 3. Scattered muscular activity, likely physiologic. 4. Continued activity in the remainder of the stomach, current maximum SUV 8.1. This is nonspecific. 5. Other imaging findings of potential clinical significance: Chronic ethmoid sinusitis. Aortic Atherosclerosis (ICD10-I70.0). Coronary atherosclerosis. Airway thickening is present, suggesting bronchitis or reactive airways disease. Mild prostatomegaly. Electronically Signed   By: Van Clines M.D.   On: 05/17/2020 16:41   DG Chest Port 1 View  Result Date: 05/24/2020 CLINICAL DATA:  Altered mental status EXAM: PORTABLE CHEST 1 VIEW COMPARISON:  05/17/2020 FINDINGS: The heart size and mediastinal contours are within normal limits. Both lungs are clear. The visualized skeletal structures are unremarkable. IMPRESSION: No active disease. Electronically Signed   By: Inez Catalina M.D.   On: 05/24/2020 02:29    Microbiology: Recent Results (from the past 240 hour(s))  SARS Coronavirus 2 by RT PCR (hospital order, performed in [facility] hospital lab)  Nasopharyngeal Nasopharyngeal Swab     Status: None   Collection Time: 05/24/20  3:28 AM   Specimen: Nasopharyngeal Swab  Result Value Ref Range Status   SARS Coronavirus 2 NEGATIVE NEGATIVE Final    Comment:  (NOTE) SARS-CoV-2 target nucleic acids are NOT DETECTED.  The SARS-CoV-2 RNA is generally detectable in upper and lower respiratory specimens during the acute phase of infection. The lowest concentration of SARS-CoV-2 viral copies this assay can detect is 250 copies / mL. A negative result does not preclude SARS-CoV-2 infection and should not be used as the sole basis for treatment or other patient management decisions.  A negative result may occur with improper specimen collection / handling, submission of specimen other than nasopharyngeal swab, presence of viral mutation(s) within the areas targeted by this assay, and inadequate number of viral copies (<250 copies / mL). A negative result must be combined with clinical observations, patient history, and epidemiological information.  Fact Sheet for Patients:   StrictlyIdeas.no  Fact Sheet for Healthcare Providers: BankingDealers.co.za  This test is not yet approved or  cleared by the [place] FDA and has been authorized for detection and/or diagnosis of SARS-CoV-2 by FDA under an Emergency Use Authorization (EUA).  This EUA will remain in effect (meaning this test can be used) for the duration of the COVID-19 declaration under Section 564(b)(1) of the Act, 21 U.S.C. section 360bbb-3(b)(1), unless the authorization is terminated or revoked sooner.  Performed at [facility], [place] [address], [place], [place] [address]   Culture, blood (routine x 2)     Status: None   Collection Time: 05/24/20  5:00 AM   Specimen: BLOOD RIGHT WRIST  Result Value Ref Range Status   Specimen Description BLOOD RIGHT WRIST  Final   Special Requests   Final    BOTTLES DRAWN AEROBIC ONLY Blood Culture results may not be optimal due to an inadequate volume of blood received in culture bottles   Culture   Final    NO GROWTH 5 DAYS Performed at [facility], [place] [address]  [place], [place] [address]    Report Status 05/29/2020 FINAL  Final  Urine culture     Status: None   Collection Time: 05/24/20  5:36 AM   Specimen: Urine, Random  Result Value Ref Range Status   Specimen Description URINE, RANDOM  Final   Special Requests NONE  Final   Culture   Final    NO GROWTH Performed at [facility], [place] [address], [place], [place] [address]    Report Status 05/25/2020 FINAL  Final  C Difficile Quick Screen w PCR reflex     Status: None   Collection Time: 05/30/20  4:48 AM   Specimen: Stool  Result Value Ref Range Status   C Diff antigen NEGATIVE NEGATIVE Final   C Diff toxin NEGATIVE NEGATIVE Final   C Diff interpretation No C. difficile detected.  Final    Comment: Performed at [facility], [place] [address] [place], [place] [address]     Labs: Basic Metabolic Panel: Recent Labs  Lab 05/25/20 0207 05/26/20 0321 05/27/20 0324 05/28/20 0223 05/29/20 0149  NA 137 139 138 136 136  K 5.0 4.3 4.9 4.7 4.5  CL 111 111 111 111 113*  CO2 18* 20* 19* 18* 20*  GLUCOSE 83 87 85 106* 102*  BUN 25* 23 22 23  25*  CREATININE 1.89* 1.77* 1.79* 1.75* 1.77*  CALCIUM 7.3* 7.2* 7.2*  7.3* 7.2*  MG 1.8 1.7 1.8  --  1.7  PHOS 4.0  --   --   --   --    Liver Function Tests: Recent Labs  Lab 05/25/20 0207 05/29/20 0149  AST 24 19  ALT 15 15  ALKPHOS 50 56  BILITOT 1.5* 0.9  PROT <3.0* <3.0*  ALBUMIN 1.3* 1.1*   No results for input(s): LIPASE, AMYLASE in the last 168 hours. No results for input(s): AMMONIA in the last 168 hours. CBC: Recent Labs  Lab 05/25/20 0207 05/26/20 0321 05/27/20 0324 05/28/20 0223 05/29/20 0149  WBC 8.8 8.2 8.7 10.3 11.5*  HGB 14.7 14.9 15.8 15.4 13.8  HCT 43.4 43.4 45.9 46.1 40.7  MCV 101.4* 99.5 101.3* 102.4* 101.2*  PLT 169 162 159 150 141*   Cardiac Enzymes: No results for input(s): CKTOTAL, CKMB, CKMBINDEX, TROPONINI in the last 168 hours. BNP: BNP (last 3 results) No results for input(s): BNP in the last  8760 hours.  ProBNP (last 3 results) Recent Labs    04/19/20 1047 05/05/20 1102  PROBNP 293 320    CBG: No results for input(s): GLUCAP in the last 168 hours.     Signed:  Oswald Hillock MD.  [facility] 05/31/2020, 11:37 AM

## 2020-05-31 NOTE — TOC Transition Note (Signed)
Transition of Care [facility]) - CM/SW Discharge Note   Patient Details  Name: [name]. MRN: [id number] Date of Birth: May 03, 1933  [facility]) CM/SW Contact:  Pollie Friar, RN Phone Number: 05/31/2020, 10:38 AM   Clinical Narrative:    Pt discharging to [facility] today. Pt to transport via [facility]. Awaiting discharge and CM will arrange transport. CM will also update pts daughter: [name].   Number for report: [phone].   Final next level of care: [facility] Barriers to Discharge: No Barriers Identified   Patient Goals and CMS Choice Patient states their goals for this hospitalization and ongoing recovery are:: return back home CMS Medicare.gov Compare Post Acute Care list provided to:: Patient Represenative (must comment) Choice offered to / list presented to : Adult Children  Discharge Placement                       Discharge Plan and Services   Discharge Planning Services: CM Consult Post Acute Care Choice: Hospice, Durable Medical Equipment                               Social Determinants of Health (SDOH) Interventions     Readmission Risk Interventions No flowsheet data found.

## 2020-05-31 NOTE — Progress Notes (Signed)
[facility] ([facility]) Hospital Liaison note.   Consents are complete. Please arrange transport to [facility]. RN please call report to [phone].   Thank you,     Mary Anne Robertson, RN, CCM       [facility] Hospital Liaison (listed on [facility] under Hospice/[facility])     [phone]  

## 2020-06-02 LAB — GASTROINTESTINAL PANEL BY PCR, STOOL (REPLACES STOOL CULTURE)

## 2020-06-28 DEATH — deceased

## 2020-11-03 ENCOUNTER — Telehealth: Payer: Self-pay | Admitting: Family Medicine

## 2020-11-03 NOTE — Telephone Encounter (Signed)
Left message for patient to call back and schedule Medicare Annual Wellness Visit (AWV) either virtually or in office.   Last AWV 11/08/19  please schedule at anytime with [facility] Nurse Health Advisor 1 or 2   This should be a 45 minute visit.
